# Patient Record
Sex: Female | Born: 1940
Health system: Southern US, Community
[De-identification: ages and names within clinical notes are randomized; demographics above are authoritative.]

## PROBLEM LIST (undated history)

## (undated) DIAGNOSIS — M5416 Radiculopathy, lumbar region: Secondary | ICD-10-CM

## (undated) DIAGNOSIS — M961 Postlaminectomy syndrome, not elsewhere classified: Secondary | ICD-10-CM

## (undated) DIAGNOSIS — Z87898 Personal history of other specified conditions: Secondary | ICD-10-CM

## (undated) DIAGNOSIS — F419 Anxiety disorder, unspecified: Secondary | ICD-10-CM

## (undated) DIAGNOSIS — E041 Nontoxic single thyroid nodule: Secondary | ICD-10-CM

## (undated) DIAGNOSIS — M5136 Other intervertebral disc degeneration, lumbar region: Secondary | ICD-10-CM

## (undated) DIAGNOSIS — M5417 Radiculopathy, lumbosacral region: Secondary | ICD-10-CM

## (undated) DIAGNOSIS — M51369 Other intervertebral disc degeneration, lumbar region without mention of lumbar back pain or lower extremity pain: Secondary | ICD-10-CM

## (undated) DIAGNOSIS — D126 Benign neoplasm of colon, unspecified: Secondary | ICD-10-CM

## (undated) DIAGNOSIS — E049 Nontoxic goiter, unspecified: Secondary | ICD-10-CM

## (undated) DIAGNOSIS — M7120 Synovial cyst of popliteal space [Baker], unspecified knee: Secondary | ICD-10-CM

## (undated) DIAGNOSIS — R112 Nausea with vomiting, unspecified: Secondary | ICD-10-CM

## (undated) DIAGNOSIS — M1990 Unspecified osteoarthritis, unspecified site: Secondary | ICD-10-CM

## (undated) DIAGNOSIS — I10 Essential (primary) hypertension: Secondary | ICD-10-CM

## (undated) DIAGNOSIS — Z9889 Other specified postprocedural states: Secondary | ICD-10-CM

## (undated) HISTORY — DX: Personal history of other specified conditions: Z87.898

## (undated) HISTORY — PX: SHOULDER SURGERY: SHX246

## (undated) HISTORY — PX: OOPHORECTOMY: SHX86

## (undated) HISTORY — DX: Other intervertebral disc degeneration, lumbar region: M51.36

## (undated) HISTORY — PX: FACELIFT: SHX1566

## (undated) HISTORY — DX: Postlaminectomy syndrome, not elsewhere classified: M96.1

## (undated) HISTORY — DX: Essential (primary) hypertension: I10

## (undated) HISTORY — DX: Nontoxic goiter, unspecified: E04.9

## (undated) HISTORY — PX: OTHER SURGICAL HISTORY: SHX169

## (undated) HISTORY — PX: ABDOMINAL HYSTERECTOMY: SHX81

## (undated) HISTORY — DX: Anxiety disorder, unspecified: F41.9

## (undated) HISTORY — DX: Unspecified osteoarthritis, unspecified site: M19.90

## (undated) HISTORY — DX: Benign neoplasm of colon, unspecified: D12.6

## (undated) HISTORY — PX: KNEE ARTHROSCOPY: SUR90

## (undated) HISTORY — DX: Other intervertebral disc degeneration, lumbar region without mention of lumbar back pain or lower extremity pain: M51.369

## (undated) HISTORY — DX: Radiculopathy, lumbar region: M54.16

## (undated) HISTORY — DX: Radiculopathy, lumbosacral region: M54.17

## (undated) HISTORY — DX: Synovial cyst of popliteal space (Baker), unspecified knee: M71.20

## (undated) HISTORY — PX: INCISIONAL HERNIA REPAIR: SHX193

## (undated) HISTORY — PX: KNEE SURGERY: SHX244

## (undated) HISTORY — DX: Nontoxic single thyroid nodule: E04.1

---

## 2002-01-22 ENCOUNTER — Other Ambulatory Visit: Admission: RE | Admit: 2002-01-22 | Discharge: 2002-01-22 | Payer: Self-pay | Admitting: Family Medicine

## 2002-02-05 ENCOUNTER — Encounter: Admission: RE | Admit: 2002-02-05 | Discharge: 2002-02-05 | Payer: Self-pay | Admitting: Family Medicine

## 2002-02-05 ENCOUNTER — Encounter: Payer: Self-pay | Admitting: Family Medicine

## 2003-02-12 ENCOUNTER — Encounter: Payer: Self-pay | Admitting: Family Medicine

## 2003-02-12 ENCOUNTER — Encounter: Admission: RE | Admit: 2003-02-12 | Discharge: 2003-02-12 | Payer: Self-pay | Admitting: Family Medicine

## 2003-03-12 DIAGNOSIS — D126 Benign neoplasm of colon, unspecified: Secondary | ICD-10-CM

## 2003-03-12 HISTORY — DX: Benign neoplasm of colon, unspecified: D12.6

## 2003-04-08 ENCOUNTER — Encounter: Payer: Self-pay | Admitting: Gastroenterology

## 2004-02-23 ENCOUNTER — Encounter: Admission: RE | Admit: 2004-02-23 | Discharge: 2004-02-23 | Payer: Self-pay | Admitting: Family Medicine

## 2004-03-01 ENCOUNTER — Encounter: Admission: RE | Admit: 2004-03-01 | Discharge: 2004-03-01 | Payer: Self-pay | Admitting: Family Medicine

## 2004-11-07 ENCOUNTER — Ambulatory Visit: Payer: Self-pay | Admitting: Internal Medicine

## 2004-11-07 ENCOUNTER — Encounter: Admission: RE | Admit: 2004-11-07 | Discharge: 2004-11-07 | Payer: Self-pay | Admitting: Internal Medicine

## 2005-01-23 ENCOUNTER — Encounter: Admission: RE | Admit: 2005-01-23 | Discharge: 2005-01-23 | Payer: Self-pay | Admitting: Otolaryngology

## 2005-02-01 ENCOUNTER — Observation Stay (HOSPITAL_COMMUNITY): Admission: RE | Admit: 2005-02-01 | Discharge: 2005-02-02 | Payer: Self-pay | Admitting: Specialist

## 2005-02-01 ENCOUNTER — Encounter (INDEPENDENT_AMBULATORY_CARE_PROVIDER_SITE_OTHER): Payer: Self-pay | Admitting: Specialist

## 2005-03-02 ENCOUNTER — Encounter: Admission: RE | Admit: 2005-03-02 | Discharge: 2005-03-02 | Payer: Self-pay | Admitting: Family Medicine

## 2005-04-04 ENCOUNTER — Ambulatory Visit: Payer: Self-pay | Admitting: Internal Medicine

## 2005-05-16 ENCOUNTER — Ambulatory Visit: Payer: Self-pay | Admitting: Internal Medicine

## 2005-05-31 ENCOUNTER — Encounter (INDEPENDENT_AMBULATORY_CARE_PROVIDER_SITE_OTHER): Payer: Self-pay | Admitting: *Deleted

## 2005-06-16 ENCOUNTER — Ambulatory Visit: Payer: Self-pay | Admitting: Internal Medicine

## 2005-09-11 DIAGNOSIS — I10 Essential (primary) hypertension: Secondary | ICD-10-CM

## 2005-09-11 HISTORY — DX: Essential (primary) hypertension: I10

## 2005-11-02 ENCOUNTER — Ambulatory Visit: Payer: Self-pay | Admitting: Internal Medicine

## 2005-12-18 ENCOUNTER — Ambulatory Visit: Payer: Self-pay | Admitting: Family Medicine

## 2005-12-19 ENCOUNTER — Ambulatory Visit (HOSPITAL_BASED_OUTPATIENT_CLINIC_OR_DEPARTMENT_OTHER): Admission: RE | Admit: 2005-12-19 | Discharge: 2005-12-19 | Payer: Self-pay | Admitting: Specialist

## 2005-12-26 ENCOUNTER — Ambulatory Visit: Payer: Self-pay | Admitting: Internal Medicine

## 2005-12-29 ENCOUNTER — Encounter: Admission: RE | Admit: 2005-12-29 | Discharge: 2005-12-29 | Payer: Self-pay | Admitting: Internal Medicine

## 2006-01-08 ENCOUNTER — Encounter (INDEPENDENT_AMBULATORY_CARE_PROVIDER_SITE_OTHER): Payer: Self-pay | Admitting: *Deleted

## 2006-01-08 ENCOUNTER — Other Ambulatory Visit: Admission: RE | Admit: 2006-01-08 | Discharge: 2006-01-08 | Payer: Self-pay | Admitting: Interventional Radiology

## 2006-01-08 ENCOUNTER — Encounter: Admission: RE | Admit: 2006-01-08 | Discharge: 2006-01-08 | Payer: Self-pay | Admitting: Internal Medicine

## 2006-03-20 ENCOUNTER — Ambulatory Visit: Payer: Self-pay | Admitting: Internal Medicine

## 2006-03-20 ENCOUNTER — Encounter: Admission: RE | Admit: 2006-03-20 | Discharge: 2006-03-20 | Payer: Self-pay | Admitting: Internal Medicine

## 2006-04-02 ENCOUNTER — Encounter: Admission: RE | Admit: 2006-04-02 | Discharge: 2006-04-02 | Payer: Self-pay | Admitting: Internal Medicine

## 2006-05-01 ENCOUNTER — Inpatient Hospital Stay (HOSPITAL_COMMUNITY): Admission: RE | Admit: 2006-05-01 | Discharge: 2006-05-04 | Payer: Self-pay | Admitting: Specialist

## 2006-05-28 ENCOUNTER — Encounter: Admission: RE | Admit: 2006-05-28 | Discharge: 2006-06-18 | Payer: Self-pay | Admitting: Specialist

## 2006-06-19 ENCOUNTER — Encounter: Admission: RE | Admit: 2006-06-19 | Discharge: 2006-07-29 | Payer: Self-pay | Admitting: Specialist

## 2006-07-17 ENCOUNTER — Ambulatory Visit: Payer: Self-pay | Admitting: Gastroenterology

## 2006-07-30 ENCOUNTER — Encounter: Admission: RE | Admit: 2006-07-30 | Discharge: 2006-08-10 | Payer: Self-pay | Admitting: Specialist

## 2006-09-25 ENCOUNTER — Ambulatory Visit (HOSPITAL_COMMUNITY): Admission: RE | Admit: 2006-09-25 | Discharge: 2006-09-26 | Payer: Self-pay | Admitting: Specialist

## 2006-09-26 ENCOUNTER — Encounter: Admission: RE | Admit: 2006-09-26 | Discharge: 2006-11-04 | Payer: Self-pay | Admitting: Specialist

## 2006-10-22 ENCOUNTER — Ambulatory Visit: Payer: Self-pay | Admitting: Internal Medicine

## 2006-10-22 LAB — CONVERTED CEMR LAB
BUN: 22 mg/dL (ref 6–23)
CO2: 27 meq/L (ref 19–32)
Calcium: 9.4 mg/dL (ref 8.4–10.5)
Chloride: 100 meq/L (ref 96–112)
Creatinine, Ser: 0.7 mg/dL (ref 0.4–1.2)
Free T4: 0.7 ng/dL (ref 0.6–1.6)
GFR calc Af Amer: 108 mL/min
GFR calc non Af Amer: 89 mL/min
Glucose, Bld: 75 mg/dL (ref 70–99)
Potassium: 3.8 meq/L (ref 3.5–5.1)
Sodium: 139 meq/L (ref 135–145)
T3, Free: 2.6 pg/mL (ref 2.3–4.2)
TSH: 1.79 microintl units/mL (ref 0.35–5.50)

## 2006-11-05 ENCOUNTER — Encounter: Admission: RE | Admit: 2006-11-05 | Discharge: 2007-02-03 | Payer: Self-pay | Admitting: Specialist

## 2006-12-20 DIAGNOSIS — M7120 Synovial cyst of popliteal space [Baker], unspecified knee: Secondary | ICD-10-CM | POA: Insufficient documentation

## 2007-01-11 ENCOUNTER — Ambulatory Visit: Payer: Self-pay | Admitting: Internal Medicine

## 2007-01-11 LAB — CONVERTED CEMR LAB
ALT: 17 units/L (ref 0–40)
AST: 27 units/L (ref 0–37)
Basophils Absolute: 0.1 10*3/uL (ref 0.0–0.1)
Basophils Relative: 0.8 % (ref 0.0–1.0)
Cholesterol: 236 mg/dL (ref 0–200)
Direct LDL: 122.6 mg/dL
Eosinophils Absolute: 0.4 10*3/uL (ref 0.0–0.6)
Eosinophils Relative: 4.7 % (ref 0.0–5.0)
HCT: 39.1 % (ref 36.0–46.0)
HDL: 82.4 mg/dL (ref >39.0)
Hemoglobin: 13.6 g/dL (ref 12.0–15.0)
Homocysteine: 12.4 micromoles/L (ref 5.00–13.90)
Lymphocytes Relative: 35.2 % (ref 12.0–46.0)
MCHC: 34.7 g/dL (ref 30.0–36.0)
MCV: 90.8 fL (ref 78.0–100.0)
Monocytes Absolute: 0.6 10*3/uL (ref 0.2–0.7)
Monocytes Relative: 7.6 % (ref 3.0–11.0)
Neutro Abs: 4.1 10*3/uL (ref 1.4–7.7)
Neutrophils Relative %: 51.7 % (ref 43.0–77.0)
Platelets: 294 10*3/uL (ref 150–400)
RBC: 4.31 M/uL (ref 3.87–5.11)
RDW: 12.9 % (ref 11.5–14.6)
Total CHOL/HDL Ratio: 2.9
Triglycerides: 78 mg/dL (ref 0–149)
VLDL: 16 mg/dL (ref 0–40)
WBC: 8.1 10*3/uL (ref 4.5–10.5)

## 2007-02-19 ENCOUNTER — Encounter: Admission: RE | Admit: 2007-02-19 | Discharge: 2007-02-19 | Payer: Self-pay | Admitting: Orthopedic Surgery

## 2007-05-02 ENCOUNTER — Encounter: Admission: RE | Admit: 2007-05-02 | Discharge: 2007-05-02 | Payer: Self-pay | Admitting: Internal Medicine

## 2007-05-02 ENCOUNTER — Encounter: Payer: Self-pay | Admitting: Internal Medicine

## 2007-05-14 ENCOUNTER — Encounter (INDEPENDENT_AMBULATORY_CARE_PROVIDER_SITE_OTHER): Payer: Self-pay | Admitting: *Deleted

## 2007-07-24 ENCOUNTER — Ambulatory Visit: Payer: Self-pay | Admitting: Internal Medicine

## 2007-08-06 ENCOUNTER — Encounter: Payer: Self-pay | Admitting: Internal Medicine

## 2007-11-27 ENCOUNTER — Telehealth (INDEPENDENT_AMBULATORY_CARE_PROVIDER_SITE_OTHER): Payer: Self-pay | Admitting: *Deleted

## 2007-12-02 ENCOUNTER — Telehealth: Payer: Self-pay | Admitting: Internal Medicine

## 2008-01-01 ENCOUNTER — Encounter: Payer: Self-pay | Admitting: Internal Medicine

## 2008-01-27 ENCOUNTER — Ambulatory Visit: Payer: Self-pay | Admitting: Internal Medicine

## 2008-01-27 ENCOUNTER — Other Ambulatory Visit: Admission: RE | Admit: 2008-01-27 | Discharge: 2008-01-27 | Payer: Self-pay | Admitting: Internal Medicine

## 2008-01-27 ENCOUNTER — Encounter: Payer: Self-pay | Admitting: Internal Medicine

## 2008-01-27 DIAGNOSIS — F411 Generalized anxiety disorder: Secondary | ICD-10-CM | POA: Insufficient documentation

## 2008-01-27 DIAGNOSIS — M1990 Unspecified osteoarthritis, unspecified site: Secondary | ICD-10-CM | POA: Insufficient documentation

## 2008-01-27 LAB — CONVERTED CEMR LAB: Pap Smear: NORMAL

## 2008-01-30 LAB — CONVERTED CEMR LAB
ALT: 17 units/L (ref 0–35)
AST: 21 units/L (ref 0–37)
BUN: 18 mg/dL (ref 6–23)
Basophils Absolute: 0 10*3/uL (ref 0.0–0.1)
Basophils Relative: 0.4 % (ref 0.0–1.0)
CO2: 32 meq/L (ref 19–32)
Calcium: 9.4 mg/dL (ref 8.4–10.5)
Chloride: 105 meq/L (ref 96–112)
Cholesterol: 251 mg/dL (ref 0–200)
Creatinine, Ser: 0.8 mg/dL (ref 0.4–1.2)
Direct LDL: 133.2 mg/dL
Eosinophils Absolute: 0.3 10*3/uL (ref 0.0–0.7)
Eosinophils Relative: 3.6 % (ref 0.0–5.0)
GFR calc Af Amer: 92 mL/min
GFR calc non Af Amer: 76 mL/min
Glucose, Bld: 93 mg/dL (ref 70–99)
HCT: 42.5 % (ref 36.0–46.0)
HDL: 88.6 mg/dL (ref >39.0)
Hemoglobin: 14.4 g/dL (ref 12.0–15.0)
Lymphocytes Relative: 33.7 % (ref 12.0–46.0)
MCHC: 33.8 g/dL (ref 30.0–36.0)
MCV: 93.4 fL (ref 78.0–100.0)
Monocytes Absolute: 0.5 10*3/uL (ref 0.1–1.0)
Monocytes Relative: 6.9 % (ref 3.0–12.0)
Neutro Abs: 4.4 10*3/uL (ref 1.4–7.7)
Neutrophils Relative %: 55.4 % (ref 43.0–77.0)
Platelets: 315 10*3/uL (ref 150–400)
Potassium: 3.6 meq/L (ref 3.5–5.1)
RBC: 4.55 M/uL (ref 3.87–5.11)
RDW: 12.3 % (ref 11.5–14.6)
Sodium: 143 meq/L (ref 135–145)
TSH: 1.26 microintl units/mL (ref 0.35–5.50)
Total CHOL/HDL Ratio: 2.8
Triglycerides: 117 mg/dL (ref 0–149)
VLDL: 23 mg/dL (ref 0–40)
WBC: 7.8 10*3/uL (ref 4.5–10.5)

## 2008-02-04 ENCOUNTER — Telehealth (INDEPENDENT_AMBULATORY_CARE_PROVIDER_SITE_OTHER): Payer: Self-pay | Admitting: *Deleted

## 2008-02-04 ENCOUNTER — Encounter (INDEPENDENT_AMBULATORY_CARE_PROVIDER_SITE_OTHER): Payer: Self-pay | Admitting: *Deleted

## 2008-02-05 ENCOUNTER — Ambulatory Visit: Payer: Self-pay | Admitting: Internal Medicine

## 2008-02-26 ENCOUNTER — Telehealth: Payer: Self-pay | Admitting: Gastroenterology

## 2008-03-24 ENCOUNTER — Ambulatory Visit: Payer: Self-pay | Admitting: Gastroenterology

## 2008-03-30 ENCOUNTER — Telehealth: Payer: Self-pay | Admitting: Gastroenterology

## 2008-03-31 ENCOUNTER — Ambulatory Visit: Payer: Self-pay | Admitting: Gastroenterology

## 2008-03-31 LAB — HM COLONOSCOPY

## 2008-04-17 ENCOUNTER — Ambulatory Visit: Payer: Self-pay | Admitting: Internal Medicine

## 2008-04-17 DIAGNOSIS — R5381 Other malaise: Secondary | ICD-10-CM | POA: Insufficient documentation

## 2008-04-17 DIAGNOSIS — R5383 Other fatigue: Secondary | ICD-10-CM

## 2008-04-20 ENCOUNTER — Telehealth: Payer: Self-pay | Admitting: Internal Medicine

## 2008-04-20 LAB — CONVERTED CEMR LAB
BUN: 23 mg/dL (ref 6–23)
Basophils Absolute: 0 10*3/uL (ref 0.0–0.1)
Basophils Relative: 0 % (ref 0–1)
Creatinine, Ser: 0.75 mg/dL (ref 0.40–1.20)
Eosinophils Absolute: 0.3 10*3/uL (ref 0.0–0.7)
Eosinophils Relative: 4 % (ref 0–5)
HCT: 45.4 % (ref 36.0–46.0)
Hemoglobin: 14.7 g/dL (ref 12.0–15.0)
Lymphocytes Relative: 34 % (ref 12–46)
Lymphs Abs: 3 10*3/uL (ref 0.7–4.0)
MCHC: 32.4 g/dL (ref 30.0–36.0)
MCV: 93.2 fL (ref 78.0–100.0)
Monocytes Absolute: 0.8 10*3/uL (ref 0.1–1.0)
Monocytes Relative: 9 % (ref 3–12)
Neutro Abs: 4.7 10*3/uL (ref 1.7–7.7)
Neutrophils Relative %: 53 % (ref 43–77)
Platelets: 305 10*3/uL (ref 150–400)
Potassium: 4.1 meq/L (ref 3.5–5.3)
RBC: 4.87 M/uL (ref 3.87–5.11)
RDW: 13.3 % (ref 11.5–15.5)
T4, Total: 6.4 ug/dL (ref 5.0–12.5)
TSH: 0.84 microintl units/mL (ref 0.350–4.50)
WBC: 8.9 10*3/uL (ref 4.0–10.5)

## 2008-04-27 ENCOUNTER — Encounter (INDEPENDENT_AMBULATORY_CARE_PROVIDER_SITE_OTHER): Payer: Self-pay | Admitting: *Deleted

## 2008-04-27 ENCOUNTER — Ambulatory Visit: Payer: Self-pay | Admitting: Internal Medicine

## 2008-04-27 LAB — CONVERTED CEMR LAB
Bilirubin Urine: NEGATIVE
Blood in Urine, dipstick: NEGATIVE
Glucose, Urine, Semiquant: NEGATIVE
Ketones, urine, test strip: NEGATIVE
Nitrite: NEGATIVE
Protein, U semiquant: NEGATIVE
Specific Gravity, Urine: 1.01
Urobilinogen, UA: 0.2
WBC Urine, dipstick: NEGATIVE
pH: 5

## 2008-04-29 ENCOUNTER — Telehealth: Payer: Self-pay | Admitting: Gastroenterology

## 2008-04-29 ENCOUNTER — Encounter (INDEPENDENT_AMBULATORY_CARE_PROVIDER_SITE_OTHER): Payer: Self-pay | Admitting: *Deleted

## 2008-04-29 LAB — CONVERTED CEMR LAB
ALT: 16 units/L (ref 0–35)
AST: 25 units/L (ref 0–37)
Albumin: 3.7 g/dL (ref 3.5–5.2)
Alkaline Phosphatase: 72 units/L (ref 39–117)
Amylase: 66 units/L (ref 27–131)
Bilirubin, Direct: 0.1 mg/dL (ref 0.0–0.3)
Hemoglobin: 14.7 g/dL (ref 12.0–15.0)
Lipase: 29 units/L (ref 11.0–59.0)
Total Bilirubin: 0.7 mg/dL (ref 0.3–1.2)
Total Protein: 7.1 g/dL (ref 6.0–8.3)

## 2008-04-30 ENCOUNTER — Ambulatory Visit: Payer: Self-pay | Admitting: Gastroenterology

## 2008-04-30 DIAGNOSIS — Z860100 Personal history of colon polyps, unspecified: Secondary | ICD-10-CM | POA: Insufficient documentation

## 2008-05-01 ENCOUNTER — Encounter: Payer: Self-pay | Admitting: Gastroenterology

## 2008-05-01 ENCOUNTER — Ambulatory Visit: Payer: Self-pay | Admitting: Gastroenterology

## 2008-05-04 ENCOUNTER — Encounter: Admission: RE | Admit: 2008-05-04 | Discharge: 2008-05-04 | Payer: Self-pay | Admitting: Internal Medicine

## 2008-05-05 ENCOUNTER — Ambulatory Visit (HOSPITAL_COMMUNITY): Admission: RE | Admit: 2008-05-05 | Discharge: 2008-05-05 | Payer: Self-pay | Admitting: Gastroenterology

## 2008-05-05 ENCOUNTER — Encounter: Payer: Self-pay | Admitting: Gastroenterology

## 2008-05-14 ENCOUNTER — Telehealth: Payer: Self-pay | Admitting: Gastroenterology

## 2008-05-27 ENCOUNTER — Ambulatory Visit: Payer: Self-pay | Admitting: Gastroenterology

## 2008-06-01 ENCOUNTER — Encounter (INDEPENDENT_AMBULATORY_CARE_PROVIDER_SITE_OTHER): Payer: Self-pay | Admitting: *Deleted

## 2008-07-14 ENCOUNTER — Ambulatory Visit: Payer: Self-pay | Admitting: Internal Medicine

## 2008-07-17 ENCOUNTER — Telehealth (INDEPENDENT_AMBULATORY_CARE_PROVIDER_SITE_OTHER): Payer: Self-pay | Admitting: *Deleted

## 2008-07-29 ENCOUNTER — Encounter (INDEPENDENT_AMBULATORY_CARE_PROVIDER_SITE_OTHER): Payer: Self-pay | Admitting: *Deleted

## 2008-08-17 ENCOUNTER — Inpatient Hospital Stay (HOSPITAL_COMMUNITY): Admission: RE | Admit: 2008-08-17 | Discharge: 2008-08-20 | Payer: Self-pay | Admitting: Orthopedic Surgery

## 2008-09-15 ENCOUNTER — Encounter: Admission: RE | Admit: 2008-09-15 | Discharge: 2008-10-26 | Payer: Self-pay | Admitting: Orthopedic Surgery

## 2008-10-01 ENCOUNTER — Encounter (INDEPENDENT_AMBULATORY_CARE_PROVIDER_SITE_OTHER): Payer: Self-pay | Admitting: *Deleted

## 2009-03-10 ENCOUNTER — Ambulatory Visit: Payer: Self-pay | Admitting: Internal Medicine

## 2009-03-16 ENCOUNTER — Encounter (INDEPENDENT_AMBULATORY_CARE_PROVIDER_SITE_OTHER): Payer: Self-pay | Admitting: *Deleted

## 2009-03-16 LAB — CONVERTED CEMR LAB
BUN: 24 mg/dL — ABNORMAL HIGH (ref 6–23)
CO2: 30 meq/L (ref 19–32)
Calcium: 9.5 mg/dL (ref 8.4–10.5)
Chloride: 104 meq/L (ref 96–112)
Creatinine, Ser: 0.8 mg/dL (ref 0.4–1.2)
GFR calc non Af Amer: 75.76 mL/min (ref >60)
Glucose, Bld: 101 mg/dL — ABNORMAL HIGH (ref 70–99)
Potassium: 4.1 meq/L (ref 3.5–5.1)
Sodium: 141 meq/L (ref 135–145)

## 2009-05-06 ENCOUNTER — Encounter: Admission: RE | Admit: 2009-05-06 | Discharge: 2009-05-06 | Payer: Self-pay | Admitting: Internal Medicine

## 2009-07-01 ENCOUNTER — Ambulatory Visit (HOSPITAL_COMMUNITY): Admission: RE | Admit: 2009-07-01 | Discharge: 2009-07-02 | Payer: Self-pay | Admitting: Orthopedic Surgery

## 2009-07-01 ENCOUNTER — Encounter: Payer: Self-pay | Admitting: Internal Medicine

## 2009-07-14 ENCOUNTER — Encounter: Admission: RE | Admit: 2009-07-14 | Discharge: 2009-09-07 | Payer: Self-pay | Admitting: Orthopedic Surgery

## 2009-10-25 ENCOUNTER — Ambulatory Visit: Payer: Self-pay | Admitting: Internal Medicine

## 2009-10-28 LAB — CONVERTED CEMR LAB
BUN: 25 mg/dL — ABNORMAL HIGH (ref 6–23)
Basophils Absolute: 0.1 10*3/uL (ref 0.0–0.1)
Basophils Relative: 0.6 % (ref 0.0–3.0)
CO2: 30 meq/L (ref 19–32)
Calcium: 9.6 mg/dL (ref 8.4–10.5)
Chloride: 106 meq/L (ref 96–112)
Creatinine, Ser: 0.8 mg/dL (ref 0.4–1.2)
Eosinophils Absolute: 0.3 10*3/uL (ref 0.0–0.7)
Eosinophils Relative: 3.6 % (ref 0.0–5.0)
GFR calc non Af Amer: 75.62 mL/min (ref >60)
Glucose, Bld: 89 mg/dL (ref 70–99)
HCT: 44.2 % (ref 36.0–46.0)
Hemoglobin: 14.7 g/dL (ref 12.0–15.0)
Lymphocytes Relative: 31.4 % (ref 12.0–46.0)
Lymphs Abs: 2.7 10*3/uL (ref 0.7–4.0)
MCHC: 33.2 g/dL (ref 30.0–36.0)
MCV: 94.5 fL (ref 78.0–100.0)
Monocytes Absolute: 0.9 10*3/uL (ref 0.1–1.0)
Monocytes Relative: 10.1 % (ref 3.0–12.0)
Neutro Abs: 4.5 10*3/uL (ref 1.4–7.7)
Neutrophils Relative %: 54.3 % (ref 43.0–77.0)
Platelets: 268 10*3/uL (ref 150.0–400.0)
Potassium: 4.8 meq/L (ref 3.5–5.1)
RBC: 4.68 M/uL (ref 3.87–5.11)
RDW: 12.7 % (ref 11.5–14.6)
Sodium: 142 meq/L (ref 135–145)
WBC: 8.5 10*3/uL (ref 4.5–10.5)

## 2009-11-08 ENCOUNTER — Inpatient Hospital Stay (HOSPITAL_COMMUNITY): Admission: RE | Admit: 2009-11-08 | Discharge: 2009-11-11 | Payer: Self-pay | Admitting: Orthopedic Surgery

## 2009-11-09 HISTORY — PX: TOTAL HIP ARTHROPLASTY: SHX124

## 2009-11-30 ENCOUNTER — Encounter
Admission: RE | Admit: 2009-11-30 | Discharge: 2010-01-04 | Payer: Self-pay | Source: Home / Self Care | Admitting: Orthopedic Surgery

## 2010-02-04 ENCOUNTER — Ambulatory Visit: Payer: Self-pay | Admitting: Internal Medicine

## 2010-02-04 ENCOUNTER — Encounter (INDEPENDENT_AMBULATORY_CARE_PROVIDER_SITE_OTHER): Payer: Self-pay | Admitting: *Deleted

## 2010-03-15 ENCOUNTER — Ambulatory Visit: Payer: Self-pay | Admitting: Internal Medicine

## 2010-03-15 DIAGNOSIS — E041 Nontoxic single thyroid nodule: Secondary | ICD-10-CM | POA: Insufficient documentation

## 2010-03-16 ENCOUNTER — Encounter: Payer: Self-pay | Admitting: Internal Medicine

## 2010-03-16 LAB — CONVERTED CEMR LAB: Vit D, 25-Hydroxy: 52 ng/mL (ref 30–89)

## 2010-03-17 LAB — CONVERTED CEMR LAB
ALT: 20 U/L
AST: 29 U/L
Albumin: 4.5 g/dL
Alkaline Phosphatase: 69 U/L
BUN: 26 mg/dL — ABNORMAL HIGH
Bilirubin, Direct: 0.1 mg/dL
CO2: 29 meq/L
Calcium: 9.4 mg/dL
Chloride: 104 meq/L
Cholesterol: 249 mg/dL — ABNORMAL HIGH
Creatinine, Ser: 0.7 mg/dL
Direct LDL: 121.9 mg/dL
GFR calc non Af Amer: 85.3 mL/min
Glucose, Bld: 98 mg/dL
HDL: 98.6 mg/dL
Potassium: 4.7 meq/L
Sodium: 141 meq/L
TSH: 1.13 u[IU]/mL
Total Bilirubin: 0.7 mg/dL
Total CHOL/HDL Ratio: 3
Total Protein: 6.3 g/dL
Triglycerides: 118 mg/dL
VLDL: 23.6 mg/dL

## 2010-03-21 ENCOUNTER — Encounter: Admission: RE | Admit: 2010-03-21 | Discharge: 2010-03-21 | Payer: Self-pay | Admitting: Internal Medicine

## 2010-03-21 ENCOUNTER — Encounter: Payer: Self-pay | Admitting: Internal Medicine

## 2010-04-12 ENCOUNTER — Encounter: Payer: Self-pay | Admitting: Internal Medicine

## 2010-05-03 ENCOUNTER — Encounter: Admission: RE | Admit: 2010-05-03 | Discharge: 2010-05-03 | Payer: Self-pay | Admitting: Surgery

## 2010-05-03 ENCOUNTER — Other Ambulatory Visit: Admission: RE | Admit: 2010-05-03 | Discharge: 2010-05-03 | Payer: Self-pay | Admitting: Interventional Radiology

## 2010-05-03 ENCOUNTER — Encounter: Payer: Self-pay | Admitting: Internal Medicine

## 2010-05-09 ENCOUNTER — Encounter: Payer: Self-pay | Admitting: Internal Medicine

## 2010-05-09 ENCOUNTER — Encounter: Admission: RE | Admit: 2010-05-09 | Discharge: 2010-05-09 | Payer: Self-pay | Admitting: Internal Medicine

## 2010-05-09 LAB — HM MAMMOGRAPHY: HM Mammogram: NEGATIVE

## 2010-06-13 ENCOUNTER — Encounter: Payer: Self-pay | Admitting: Internal Medicine

## 2010-06-17 ENCOUNTER — Encounter: Payer: Self-pay | Admitting: Internal Medicine

## 2010-06-28 ENCOUNTER — Telehealth (INDEPENDENT_AMBULATORY_CARE_PROVIDER_SITE_OTHER): Payer: Self-pay | Admitting: *Deleted

## 2010-08-17 ENCOUNTER — Encounter: Payer: Self-pay | Admitting: Internal Medicine

## 2010-08-17 ENCOUNTER — Ambulatory Visit: Payer: Self-pay | Admitting: Internal Medicine

## 2010-08-18 LAB — CONVERTED CEMR LAB
BUN: 23 mg/dL (ref 6–23)
Basophils Absolute: 0 10*3/uL (ref 0.0–0.1)
Basophils Relative: 0.5 % (ref 0.0–3.0)
CO2: 30 meq/L (ref 19–32)
Calcium: 9.5 mg/dL (ref 8.4–10.5)
Chloride: 99 meq/L (ref 96–112)
Creatinine, Ser: 0.8 mg/dL (ref 0.4–1.2)
Eosinophils Absolute: 0.4 10*3/uL (ref 0.0–0.7)
Eosinophils Relative: 4.3 % (ref 0.0–5.0)
GFR calc non Af Amer: 78.84 mL/min (ref >60.00)
Glucose, Bld: 69 mg/dL — ABNORMAL LOW (ref 70–99)
HCT: 40.2 % (ref 36.0–46.0)
Hemoglobin: 13.6 g/dL (ref 12.0–15.0)
Lymphocytes Relative: 27 % (ref 12.0–46.0)
Lymphs Abs: 2.4 10*3/uL (ref 0.7–4.0)
MCHC: 33.9 g/dL (ref 30.0–36.0)
MCV: 94.9 fL (ref 78.0–100.0)
Monocytes Absolute: 0.7 10*3/uL (ref 0.1–1.0)
Monocytes Relative: 7.8 % (ref 3.0–12.0)
Neutro Abs: 5.4 10*3/uL (ref 1.4–7.7)
Neutrophils Relative %: 60.4 % (ref 43.0–77.0)
Platelets: 286 10*3/uL (ref 150.0–400.0)
Potassium: 4.5 meq/L (ref 3.5–5.1)
RBC: 4.24 M/uL (ref 3.87–5.11)
RDW: 13.9 % (ref 11.5–14.6)
Sodium: 140 meq/L (ref 135–145)
WBC: 8.9 10*3/uL (ref 4.5–10.5)

## 2010-09-06 ENCOUNTER — Encounter: Payer: Self-pay | Admitting: Internal Medicine

## 2010-09-23 ENCOUNTER — Telehealth: Payer: Self-pay | Admitting: Internal Medicine

## 2010-09-26 ENCOUNTER — Inpatient Hospital Stay (HOSPITAL_COMMUNITY)
Admission: RE | Admit: 2010-09-26 | Discharge: 2010-09-28 | Payer: Self-pay | Source: Home / Self Care | Attending: Orthopedic Surgery | Admitting: Orthopedic Surgery

## 2010-09-26 LAB — COMPREHENSIVE METABOLIC PANEL
ALT: 16 U/L (ref 0–35)
AST: 21 U/L (ref 0–37)
Albumin: 4.5 g/dL (ref 3.5–5.2)
Alkaline Phosphatase: 67 U/L (ref 39–117)
BUN: 30 mg/dL — ABNORMAL HIGH (ref 6–23)
CO2: 28 mEq/L (ref 19–32)
Calcium: 9.9 mg/dL (ref 8.4–10.5)
Chloride: 100 mEq/L (ref 96–112)
Creatinine, Ser: 1.04 mg/dL (ref 0.4–1.2)
GFR calc Af Amer: >60 mL/min (ref >60)
GFR calc non Af Amer: 53 mL/min — ABNORMAL LOW (ref >60)
Glucose, Bld: 98 mg/dL (ref 70–99)
Potassium: 4.6 mEq/L (ref 3.5–5.1)
Sodium: 138 mEq/L (ref 135–145)
Total Bilirubin: 1 mg/dL (ref 0.3–1.2)
Total Protein: 6.7 g/dL (ref 6.0–8.3)

## 2010-09-26 LAB — URINALYSIS, ROUTINE W REFLEX MICROSCOPIC
Bilirubin Urine: NEGATIVE
Hgb urine dipstick: NEGATIVE
Ketones, ur: NEGATIVE mg/dL
Nitrite: NEGATIVE
Protein, ur: NEGATIVE mg/dL
Specific Gravity, Urine: 1.018 (ref 1.005–1.030)
Urine Glucose, Fasting: NEGATIVE mg/dL
Urobilinogen, UA: 0.2 mg/dL (ref 0.0–1.0)
pH: 5.5 (ref 5.0–8.0)

## 2010-09-26 LAB — APTT: aPTT: 27 seconds (ref 24–37)

## 2010-09-26 LAB — TYPE AND SCREEN
ABO/RH(D): B NEG
Antibody Screen: NEGATIVE

## 2010-09-26 LAB — PROTIME-INR
INR: 1.04 (ref 0.00–1.49)
Prothrombin Time: 13.8 seconds (ref 11.6–15.2)

## 2010-09-26 LAB — CBC
HCT: 42.2 % (ref 36.0–46.0)
Hemoglobin: 13.9 g/dL (ref 12.0–15.0)
MCH: 31.6 pg (ref 26.0–34.0)
MCHC: 32.9 g/dL (ref 30.0–36.0)
MCV: 95.9 fL (ref 78.0–100.0)
Platelets: 337 10*3/uL (ref 150–400)
RBC: 4.4 MIL/uL (ref 3.87–5.11)
RDW: 13.2 % (ref 11.5–15.5)
WBC: 10.2 10*3/uL (ref 4.0–10.5)

## 2010-09-26 LAB — SURGICAL PCR SCREEN
MRSA, PCR: NEGATIVE
Staphylococcus aureus: POSITIVE — AB

## 2010-09-28 LAB — BASIC METABOLIC PANEL
BUN: 12 mg/dL (ref 6–23)
CO2: 29 mEq/L (ref 19–32)
Calcium: 8.9 mg/dL (ref 8.4–10.5)
Chloride: 105 mEq/L (ref 96–112)
Creatinine, Ser: 0.81 mg/dL (ref 0.4–1.2)
GFR calc Af Amer: >60 mL/min (ref >60)
GFR calc non Af Amer: >60 mL/min (ref >60)
Glucose, Bld: 152 mg/dL — ABNORMAL HIGH (ref 70–99)
Potassium: 4.5 mEq/L (ref 3.5–5.1)
Sodium: 139 mEq/L (ref 135–145)

## 2010-09-28 LAB — CBC
HCT: 39.1 % (ref 36.0–46.0)
Hemoglobin: 12.4 g/dL (ref 12.0–15.0)
MCH: 30.8 pg (ref 26.0–34.0)
MCHC: 31.7 g/dL (ref 30.0–36.0)
MCV: 97.3 fL (ref 78.0–100.0)
Platelets: 279 10*3/uL (ref 150–400)
RBC: 4.02 MIL/uL (ref 3.87–5.11)
RDW: 13.4 % (ref 11.5–15.5)
WBC: 11.5 10*3/uL — ABNORMAL HIGH (ref 4.0–10.5)

## 2010-09-28 LAB — PROTIME-INR
INR: 1 (ref 0.00–1.49)
Prothrombin Time: 13.4 seconds (ref 11.6–15.2)

## 2010-10-02 ENCOUNTER — Encounter: Payer: Self-pay | Admitting: Family Medicine

## 2010-10-02 ENCOUNTER — Encounter: Payer: Self-pay | Admitting: Internal Medicine

## 2010-10-03 ENCOUNTER — Encounter: Payer: Self-pay | Admitting: Internal Medicine

## 2010-10-03 LAB — CBC
HCT: 40.4 % (ref 36.0–46.0)
Hemoglobin: 12.7 g/dL (ref 12.0–15.0)
MCH: 31 pg (ref 26.0–34.0)
MCHC: 31.4 g/dL (ref 30.0–36.0)
MCV: 98.5 fL (ref 78.0–100.0)
Platelets: 290 10*3/uL (ref 150–400)
RBC: 4.1 MIL/uL (ref 3.87–5.11)
RDW: 13.6 % (ref 11.5–15.5)
WBC: 10.6 10*3/uL — ABNORMAL HIGH (ref 4.0–10.5)

## 2010-10-03 LAB — BASIC METABOLIC PANEL
BUN: 12 mg/dL (ref 6–23)
CO2: 29 mEq/L (ref 19–32)
Calcium: 9.1 mg/dL (ref 8.4–10.5)
Chloride: 104 mEq/L (ref 96–112)
Creatinine, Ser: 0.94 mg/dL (ref 0.4–1.2)
GFR calc Af Amer: >60 mL/min (ref >60)
GFR calc non Af Amer: 59 mL/min — ABNORMAL LOW (ref >60)
Glucose, Bld: 105 mg/dL — ABNORMAL HIGH (ref 70–99)
Potassium: 4.2 mEq/L (ref 3.5–5.1)
Sodium: 140 mEq/L (ref 135–145)

## 2010-10-03 LAB — PROTIME-INR
INR: 1.51 — ABNORMAL HIGH (ref 0.00–1.49)
Prothrombin Time: 18.4 seconds — ABNORMAL HIGH (ref 11.6–15.2)

## 2010-10-09 LAB — CONVERTED CEMR LAB
BUN: 12 mg/dL (ref 6–23)
Basophils Absolute: 0.1 10*3/uL (ref 0.0–0.1)
Basophils Relative: 0.6 % (ref 0.0–3.0)
CO2: 31 meq/L (ref 19–32)
Calcium: 9.6 mg/dL (ref 8.4–10.5)
Chloride: 102 meq/L (ref 96–112)
Creatinine, Ser: 0.7 mg/dL (ref 0.4–1.2)
Eosinophils Absolute: 0.2 10*3/uL (ref 0.0–0.7)
Eosinophils Relative: 2.5 % (ref 0.0–5.0)
GFR calc Af Amer: 107 mL/min
GFR calc non Af Amer: 89 mL/min
Glucose, Bld: 122 mg/dL — ABNORMAL HIGH (ref 70–99)
HCT: 41.6 % (ref 36.0–46.0)
Hemoglobin: 14.3 g/dL (ref 12.0–15.0)
Lymphocytes Relative: 32 % (ref 12.0–46.0)
MCHC: 34.4 g/dL (ref 30.0–36.0)
MCV: 92.3 fL (ref 78.0–100.0)
Monocytes Absolute: 0.6 10*3/uL (ref 0.1–1.0)
Monocytes Relative: 6.3 % (ref 3.0–12.0)
Neutro Abs: 5.2 10*3/uL (ref 1.4–7.7)
Neutrophils Relative %: 58.6 % (ref 43.0–77.0)
Platelets: 311 10*3/uL (ref 150–400)
Potassium: 3.8 meq/L (ref 3.5–5.1)
RBC: 4.51 M/uL (ref 3.87–5.11)
RDW: 13.3 % (ref 11.5–14.6)
Sodium: 141 meq/L (ref 135–145)
WBC: 9 10*3/uL (ref 4.5–10.5)

## 2010-10-11 NOTE — Letter (Signed)
Summary: [place] No Show Letter  [facility] [place]  [address] [place], [place] [address]   Phone: [phone]  Fax: [phone]    07/29/2008 MRN: [id number]  [facility] [address] [place], [place]  [address]   Dear Ms. Deshazer,   Our records indicate that you missed your scheduled appointment with Dr. Drue Novel on 07/29/08.  Please contact this office to reschedule your appointment as soon as possible.  It is important that you keep your scheduled appointments with your physician, so we can provide you the best care possible.  Please be advised that there may be a charge for "no show" appointments.    Sincerely,   [facility]

## 2010-10-11 NOTE — Letter (Signed)
Summary: Patient [facility] Biopsy Results  [facility]  [address] [place], [place] [address]   Phone: [phone]  Fax: [phone]        May 05, 2008 MRN: [id number]    [facility] [address] [place], [place]  [address]    Dear Ms. Start,  I am pleased to inform you that the biopsies taken during your recent endoscopic examination did not show any evidence of cancer upon pathologic examination. The biopsies showed gastritis.   Continue with the treatment plan as outlined on the day of your      exam.  Please call us if you are having persistent problems or have questions about your condition that have not been fully answered at this time.  Sincerely,  Meryl Dare MD [facility]  This letter has been electronically signed by your physician.

## 2010-10-11 NOTE — Assessment & Plan Note (Signed)
Summary: F/U Gastritis/CYM.   History of Present Illness Visit Type: follow up Primary GI MD: Elie Goody MD [facility] Primary [name]: [name], MD Requesting Lateasha Breuer: [name], MD Chief Complaint: Pt is following up of  gastritis.  She is feeling better.  She has no GI complaints today. History of Present Illness:   [name] returns for follow up of gastritis. Her symptoms have completely resolved since discontinuing Naprosyn. Biopsies at endoscopy revealed chronic active gastritis. Gastric emptying scan performed after her endoscopy was unremarkable.    GI Review of Systems      Denies abdominal pain, acid reflux, belching, bloating, chest pain, dysphagia with liquids, dysphagia with solids, heartburn, loss of appetite, nausea, vomiting, vomiting blood, weight loss, and  weight gain.        Denies anal fissure, black tarry stools, change in bowel habit, constipation, diarrhea, diverticulosis, fecal incontinence, heme positive stool, hemorrhoids, irritable bowel syndrome, jaundice, light color stool, liver problems, rectal bleeding, and  rectal pain.    Prior Medications Reviewed Using: Patient Recall  Updated Prior Medication List: LORAZEPAM 0.5 MG  TABS (LORAZEPAM) TAKE AS DIRECTED LISINOPRIL-HYDROCHLOROTHIAZIDE 20-12.5 MG  TABS (LISINOPRIL-HYDROCHLOROTHIAZIDE) 1/2 by mouth qd ZYRTEC ALLERGY 10 MG  TABS (CETIRIZINE HCL) 1 by mouth once daily as needed ASPIRIN ADULT LOW STRENGTH 81 MG  TBEC (ASPIRIN) 1 by mouth once daily MULTIVITAMINS   TABS (MULTIPLE VITAMIN) Take 1 tablet by mouth once a day GLUCOSAMINE-CHONDROITIN 500-400 MG  CAPS (GLUCOSAMINE-CHONDROITIN) 1 by mouth once daily CALCIUM 500/D 500-200 MG-UNIT  TABS (CALCIUM CARBONATE-VITAMIN D) 1 by mouth once daily PROTONIX 40 MG TBEC (PANTOPRAZOLE SODIUM) one tablet by mouth once daily VICODIN 5-500 MG TABS (HYDROCODONE-ACETAMINOPHEN) as needed for knee pain  Current Allergies (reviewed today): No known  allergies   Past Medical History:    Hypertension (09/11/2005)    elevated homocysteine level    Anxiety    thyroid goiter/nodule -- rt benign colloid nodule -- left hyperplastic nodule - Thyroid bx 2006: neg    OA- shoulder pain, knee pain after TKR L, Hx of BAKER'S CYST     Goiter    Hx adenomatous colon polyps 03/2003  Past Surgical History:    Hysterectomy and oophorectomy    knee replacement (left) 2007, f/u w/ Dr Despina Hick    facelift    incisional hernia repair     knee surgery x 5   Family History:    Reviewed history from 04/30/2008 and no changes required:       breast ca--no       CAD-- F CABG at age 53s, Brother       Family History of Colon Cancer: Father (mets from prostate)       Family History of Prostate Cancer: Father        Family History of Clotting disorder: Father       Family History of Colon Polyps: Brother, Father  Social History:    Reviewed history from 04/30/2008 and no changes required:       Married       2 children       Occupation: Retired Runner, broadcasting/film/video       Patient is a former smoker. -Stopped 1985       Alcohol Use - yes-2 glasses daily       Daily Caffeine Use-1 cup daily       Illicit Drug Use - no       Patient gets regular exercise.   Risk Factors: Tobacco use:  quit    Year quit:  1982 Drug use:  no Alcohol use:  yes Exercise:  yes  Colonoscopy History:    Date of Last Colonoscopy:  03/31/2008  Mammogram History:    Date of Last Mammogram:  05/04/2008  PAP Smear History:    Date of Last PAP Smear:  01/27/2008   Review of Systems       The patient complains of arthritis/joint pain.         The pertinent positives and negatives are noted as above and in the HPI. All other ROS were negative.    Vital Signs:  Patient Profile:   71 Years Old Female Height:     64.75 inches Weight:      181 pounds BMI:     30.46 Pulse rate:   56 / minute Pulse rhythm:   regular BP sitting:   122 / 76  (left arm) Cuff size:    regular  Vitals Entered By: Francee Piccolo CMA (May 27, 2008 9:01 AM)                  Physical Exam  Head:     Normocephalic and atraumatic. Eyes:     PERRLA, no icterus. Mouth:     No deformity or lesions, dentition normal. Lungs:     Clear throughout to auscultation. Heart:     Regular rate and rhythm; no murmurs, rubs,  or bruits. Abdomen:     Soft, nontender and nondistended. No masses, hepatosplenomegaly or hernias noted. Normal bowel sounds. Neurologic:     Alert and  oriented x4;  grossly normal neurologically. Psych:     Alert and cooperative. Normal mood and affect.   Impression & Recommendations:  Problem # 1:  GASTRITIS (ICD-535.50) Chronic active gastritis related to NSAID usage. She is to minimize or avoid NSAIDs. Complete an 8 week course of pantoprazole and then may discontinue. If NSAIDs are needed in the future she should take a daily proton pump inhibitor.  Problem # 2:  PERSONAL HX COLONIC POLYPS (ICD-V12.72) Surveillance colonoscopy recommended in July 2014.  Problem # 3:  FAMILY HX COLON CANCER (ICD-V16.0) As in problem #2.  Patient Instructions: 1)  Please schedule a follow-up appointment as needed. 2)  Copy Sent To: [name] MD  ]

## 2010-10-11 NOTE — Progress Notes (Signed)
Summary: needs pre-op  eval in December  Phone Note Outgoing Call   Summary of Call: note from [facility] Patient to have knee surgery in January and needs preop clearance Please advise patient to come early in December for a preop eval Jose E. Paz MD  June 28, 2010 6:33 PM    Follow-up for Phone Call        left message to call back to sch.Harold Barban  June 29, 2010 8:32 AM  Additional Follow-up for Phone Call Additional follow up Details #1::        Patient has an appt on 12.7.11. Additional Follow-up by: Harold Barban,  June 30, 2010 9:40 AM

## 2010-10-11 NOTE — Procedures (Signed)
Summary: Colon   Colonoscopy  Procedure date:  04/08/2003  Findings:      Pathology:  Adenomatous polyp.        Location:  [facility].    Procedures Next Due Date:    Colonoscopy: 04/2006  Patient Name: [name], [name] MRN:  Procedure Procedures: Colonoscopy CPT: 09811.    with Hot Biopsy(s)CPT: [id number].  Personnel: Endoscopist: Venita Lick. Russella Dar, MD, Clementeen Graham.  Referred By: Angelena Sole, MD.  Exam Location: Exam performed in Outpatient Clinic. Outpatient  Patient Consent: Procedure, Alternatives, Risks and Benefits discussed, consent obtained, from patient. Consent was obtained by the RN.  Indications  Increased Risk Screening: For family history of colorectal neoplasia, in  parent age at onset: 24s.  Comments: Father with colon ca and brother with colon polyps History  Pre-Exam Physical: Performed Apr 08, 2003. Entire physical exam was normal.  Exam Exam: Extent of exam reached: Cecum, extent intended: Cecum.  The cecum was identified by appendiceal orifice and IC valve. Colon retroflexion performed. ASA Classification: II. Tolerance: excellent.  Monitoring: Pulse and BP monitoring, Oximetry used. Supplemental O2 given.  Colon Prep Used Golytely for colon prep. Prep results: good.  Sedation Meds: Patient assessed and found to be appropriate for moderate (conscious) sedation. Fentanyl 100 mcg. given IV. Versed 10 mg. given IV.  Findings POLYP: Descending Colon, Maximum size: 4 mm. sessile polyp. Procedure:  hot biopsy, removed, retrieved, Polyp sent to pathology. ICD9: Colon Polyps: 211.3.  NORMAL EXAM: Cecum to Splenic Flexure.  NORMAL EXAM: Rectum to Sigmoid Colon.   Assessment  Diagnoses: 211.3: Colon Polyps.   Events  Unplanned Interventions: No intervention was required.  Unplanned Events: There were no complications. Plans  Post Exam Instructions: No aspirin or non-steroidal containing medications: 2 weeks.  Medication Plan: Await  pathology. Continue current medications.  Patient Education: Patient given standard instructions for: Polyps.  Disposition: After procedure patient sent to recovery. After recovery patient sent home.  Scheduling/Referral: Colonoscopy, to [facility] T. Russella Dar, MD, [facility], around Apr 07, 2006.    This report was created from the original endoscopy report, which was reviewed and signed by the above listed endoscopist.    cc: Laveda Abbe

## 2010-10-11 NOTE — Assessment & Plan Note (Signed)
  Nurse Visit      Zostavax # 1    Vaccine Type: Zostavax    Site: left deltoid    Mfr: Merck    Dose: 0.5 ml    Route: [place]    Given by: Shary Decamp    Exp. Date: 03/21/2008    Lot #: [id number]   Orders Added: 1)  Zoster (Shingles) Vaccine Live [90736] 2)  Admin 1st Vaccine [40102]

## 2010-10-11 NOTE — Assessment & Plan Note (Signed)
Summary: CPX,WANTS PAP,FASTING,MEDICARE & BCBS/RH......   Vital Signs:  Patient profile:   70 year old female Height:      65 inches Weight:      177 pounds BMI:     29.56 Temp:     97.8 degrees F Pulse rate:   65 / minute Pulse rhythm:   regular BP sitting:   126 / 82  (left arm) Cuff size:   large  Vitals Entered By: Army Fossa CMA (March 15, 2010 8:49 AM) CC: CPX, PAP, fasting   History of Present Illness: Here for Medicare AWV:  1.Risk factors based on Past M, S, F history: done , see below  2.Physical Activities: active walks 2 miles a day 3.Depression/mood:  no evidence of depression 4.Hearing: normal to whispered voice at 6 feet, no reported problems  5.ADL's: totally independent  6.Fall Risk: low  7.Home Safety: reviewed, fire detectors uptodate 8.Height, weight, &visual acuity: see VS, vision corrected w/ reading glasses  9.Counseling: not needed at this time except for healthy lifestyle advise  10.Labs ordered based on risk factors: yes  11. Referral Coordination-- yes, DEXA 12. Care Plan---- see a/p 13.  Cognitive Assessment: AOx3, alertness and motor skills normal  in addition to the Medicare visit we did the following:  Hypertension -- no ambulatory BPs , good medication compliance  Anxiety-- occasionally takes lorazepam  OA-s/p several joint replacemnts, saw rheumatology, Rx Vinovo, it works , just $$$        Preventive Screening-Counseling & Management  Alcohol-Tobacco     Alcohol drinks/day: 1     Alcohol type: wine  Caffeine-Diet-Exercise     Type of exercise: walking  Allergies: 1)  ! Percocet  Past History:  Past Medical History: Hypertension (09/11/2005) elevated homocysteine level Anxiety thyroid goiter/nodule -- rt benign colloid nodule -- left hyperplastic nodule - Thyroid bx 2006: neg OA- shoulder pain, knee pain after TKR L, Hx of BAKER'S CYST  Goiter Hx adenomatous colon polyps 03/2003  Past Surgical  History: Hysterectomy and oophorectomy knee replacement (left) 2007 Dr Earlie Lou then redo TKR 11-09  w/ Dr Despina Hick Hip replacement  R (3-11) facelift incisional hernia repair  knee surgery x 5 shoulder surgery 06-2009  Family History: Reviewed history from 07/14/2008 and no changes required. breast ca--no CAD-- F CABG at age 29s, Brother Family History of Colon Cancer: Father (mets from prostate) Prostate Cancer: Father  Clotting disorder: Father Colon Polyps: Brother, Father  Social History: Married 2 children Occupation: Retired Runner, broadcasting/film/video Patient is a former smoker. -Stopped 1985 Alcohol Use - yes-2 glasses daily Daily Caffeine Use-1 cup daily Illicit Drug Use - no Patient walks 2 miles  daily  diet-- healthy   Review of Systems CV:  Denies chest pain or discomfort and swelling of feet. Resp:  Denies cough and shortness of breath. GI:  Denies bloody stools, diarrhea, nausea, and vomiting. GU:  no vag d/c or bleeding  occasionally SBE, normal . Neuro:  Denies headaches and memory loss.  Physical Exam  General:  alert, well-developed, and well-nourished.   Neck:  full ROM and no masses.  right thyroid nodule, nontender, measures about 1.5 cm Breasts:  No mass, nodules, thickening, tenderness, bulging, retraction, inflamation, nipple discharge or skin changes noted.  no axillary lymph nodes Lungs:  normal respiratory effort, no intercostal retractions, no accessory muscle use, and normal breath sounds.   Heart:  normal rate, regular rhythm, no murmur, and no gallop.   Abdomen:  soft, non-tender, and no distention.  Extremities:  no lower extremity edema Neurologic:  alert & oriented X3 and strength normal in all extremities.   Psych:  Cognition and judgment appear intact. Alert and cooperative with normal attention span and concentration.  not anxious appearing and not depressed appearing.     Impression & Recommendations:  Problem # 1:  HEALTH SCREENING (ICD-V70.0) Td  03 pneumonia shot 09 Shingles shot--had in 2008  multiple neg MMG,  last 04-2009 ,  breast  exam negative today last PAP 2009----: neg  s/p Hysterectomy for bening reasons, we agreed to do a PAP q 3 years   last Colonoscopy:  04/08/2003  (Results:  polyps -- next colon per GI)    DEXA 2006--normal, ordering a bone density test  encouraged to continue with her healthy diet and exercise daily  Orders: TLB-Lipid Panel (80061-LIPID) Radiology Referral (Radiology) First annual wellness visit with prevention plan  (O9629)  Problem # 2:  THYROID NODULE (ICD-241.0) previously felt , negative biopsy in the past. Thyroid has increased in size in the last couple of years per  physical exam plan: ultrasound  Orders: Radiology Referral (Radiology)  Problem # 3:  OSTEOARTHRITIS (ICD-715.90) vimovo per rheumatology , tolerates well Her updated medication list for this problem includes:    Aspirin Adult Low Strength 81 Mg Tbec (Aspirin) .Marland Kitchen... 1 by mouth once daily    Vimovo 500-20 Mg Tbec (Naproxen-esomeprazole) .Marland Kitchen... Per rheumatology  Orders: T-Vitamin D (25-Hydroxy) [phone]) Venipuncture [phone])  Problem # 4:  ANXIETY (ICD-300.00) well-controlled, refill p.r.n. Her updated medication list for this problem includes:    Lorazepam 0.5 Mg Tabs (Lorazepam) .Marland Kitchen... Take as directed     Problem # 5:  HYPERTENSION (ICD-401.9) well-controlled, discussed diet and exercise  The following medications were removed from the medication list:    Metoprolol Tartrate 25 Mg Tabs (Metoprolol tartrate) .Marland Kitchen... 1 tab  two times a day... 2 days prior and 2 days after hip replacement Her updated medication list for this problem includes:    Lisinopril-hydrochlorothiazide 20-12.5 Mg Tabs (Lisinopril-hydrochlorothiazide) .Marland Kitchen... 1/2  by mouth once daily    BP today: 126/82 Prior BP: 130/60 (10/25/2009)  Prior 10 Yr Risk Heart Disease: Not enough information (04/17/2008)  Labs Reviewed: K+: 4.8  (10/25/2009) Creat: : 0.8 (10/25/2009)   Chol: 251 (01/27/2008)   HDL: 88.6 (01/27/2008)   LDL: DEL (01/27/2008)   TG: 117 (01/27/2008)  Orders: TLB-BMP (Basic Metabolic Panel-BMET) (80048-METABOL) TLB-Hepatic/Liver Function Pnl (80076-HEPATIC) TLB-TSH (Thyroid Stimulating Hormone) (84443-TSH)  Complete Medication List: 1)  Lorazepam 0.5 Mg Tabs (Lorazepam) .... Take as directed 2)  Lisinopril-hydrochlorothiazide 20-12.5 Mg Tabs (Lisinopril-hydrochlorothiazide) .... 1/2  by mouth once daily 3)  Zyrtec Allergy 10 Mg Tabs (Cetirizine hcl) .Marland Kitchen.. 1 by mouth once daily as needed 4)  Aspirin Adult Low Strength 81 Mg Tbec (Aspirin) .Marland Kitchen.. 1 by mouth once daily 5)  Multivitamins Tabs (Multiple vitamin) .... Take 1 tablet by mouth once a day 6)  Calcium 500/d 500-200 Mg-unit Tabs (Calcium carbonate-vitamin d) .Marland Kitchen.. 1 by mouth once daily 7)  Vimovo 500-20 Mg Tbec (Naproxen-esomeprazole) .... Per rheumatology  Patient Instructions: 1)  Please schedule a follow-up appointment in 6 months .    Risk Factors:     Type:  wine    Drinks per day:  1 Exercise:  yes    Type:  walking

## 2010-10-11 NOTE — Letter (Signed)
Summary: Surgical Clearance/[facility]  Surgical Clearance/[facility]   Imported By: Lanelle Bal 10/29/2009 13:54:33  _____________________________________________________________________  External Attachment:    Type:   Image     Comment:   External Document

## 2010-10-11 NOTE — Letter (Signed)
Summary: Primary Care Consult Scheduled Letter  [facility] [place]  [address] [place], [place] [address]   Phone: [phone]  Fax: [phone]      04/27/2008 MRN: [id number]  [facility] [address] [place], [place]  [address]    Dear [name],      We have scheduled an appointment for you.  At the recommendation of Dr.Paz, we have scheduled you a consult with Dr. Russella Dar at [facility] on September 14th at 3:30pm.  Their address is [address] [place]. The office phone number is [phone].  If this appointment day and time is not convenient for you, please feel free to call the office of the doctor you are being referred to at the number listed above and reschedule the appointment.     It is important for you to keep your scheduled appointments. We are here to make sure you are given good patient care. If you have questions or you have made changes to your appointment, please notify us at  [phone], ask for [name].    Thank you,  Patient Care Coordinator [name] at [facility]

## 2010-10-11 NOTE — Progress Notes (Signed)
Summary: TRIAGE  Phone Note From Other Clinic Call back at [phone]   Caller: Tiffany @ Dr Drue Novel Call For: [name] Reason for Call: Schedule Patient Appt Details for Reason: TRIAGE Summary of Call: NP3 sch on 9-14 Wondering if she can be seen sooner...due to NAUSEA Initial call taken by: Guadlupe Spanish [facility],  April 29, 2008 4:06 PM  Follow-up for Phone Call        nausea worse with meals , unable to wait untill appt scheduled 04-24-08, offered appt with Clover Mealy on 05-01-08, but pt has an appt to have her knee drained.  Appt made for 04-30-08 9:15, she will arrive at 9:00 to fill out paperwork Follow-up by: Darcey Nora RN,  April 29, 2008 4:38 PM

## 2010-10-11 NOTE — Assessment & Plan Note (Signed)
Summary: NAUSEA.Marland KitchenEM   History of Present Illness Visit Type: consult Primary GI MD: Elie Goody MD [facility] Primary Provider: [name], MD Requesting Provider: [name], MD Chief Complaint: Patient c/o 1 month nausea-"hungy feeling."  Patient also c/o diarrhea after meals as well. History of Present Illness:   [name] relates a one month history of on going nausea associated with a "hungry feeling". She describes a gnawing epigastric discomfort, loss of appetite and 5 pound weight loss. She has been maintained on chronic Naprosyn on a daily aspirin. The Naprosyn was discontinued 2 weeks ago with no relief of symtpoms. She was recently started on Kapidex and began having diarrhea. Blood work obtained on August 17 by Dr. Drue Novel was unremarkable. Her nausea is slightly worsened with meals that is present throughout the day.   GI Review of Systems    Reports acid reflux, loss of appetite, nausea, and  weight loss.     Location of  Abdominal pain: upper abdomen. Weight loss of 5 pounds over 2 weeks.   Denies abdominal pain, belching, bloating, chest pain, dysphagia with liquids, dysphagia with solids, heartburn, vomiting, and  vomiting blood.      Reports diarrhea and  hemorrhoids.     Denies anal fissure, black tarry stools, change in bowel habit, constipation, diverticulosis, fecal incontinence, heme positive stool, irritable bowel syndrome, jaundice, light color stool, liver problems, rectal bleeding, and  rectal pain.    Prior Medications Reviewed Using: Patient Recall  Updated Prior Medication List: LORAZEPAM 0.5 MG  TABS (LORAZEPAM) TAKE AS DIRECTED LISINOPRIL-HYDROCHLOROTHIAZIDE 20-12.5 MG  TABS (LISINOPRIL-HYDROCHLOROTHIAZIDE) 1/2 by mouth qd ZYRTEC ALLERGY 10 MG  TABS (CETIRIZINE HCL) 1 by mouth once daily as needed ASPIRIN ADULT LOW STRENGTH 81 MG  TBEC (ASPIRIN) 1 by mouth once daily MULTIVITAMINS   TABS (MULTIPLE VITAMIN) Take 1 tablet by mouth once a day GLUCOSAMINE-CHONDROITIN  500-400 MG  CAPS (GLUCOSAMINE-CHONDROITIN) 1 by mouth once daily CALCIUM 500/D 500-200 MG-UNIT  TABS (CALCIUM CARBONATE-VITAMIN D) 1 by mouth once daily KAPIDEX 60 MG  CPDR (DEXLANSOPRAZOLE) 1 by mouth before breakfast  Current Allergies (reviewed today): No known allergies  Past Medical History:    Hypertension (09/11/2005)    elevated homocysteine level    Anxiety    thyroid goiter/nodule -- rt benign colloid nodule -- left hyperplastic nodule    OA- shoulder pain, knee pain after TKR L, Hx of BAKER'S CYST     Goiter    Hx adenomatous colon polyps  Past Surgical History:    Hysterectomy and oophorectomy    knee replacement (left) 2007, f/u w/ Dr Despina Hick    facelift    incisional hernia;     Thyroid bx 2006: neg    Colon polypectomy 2002; neg 7/09    knee surgery x 5   Family History:    Reviewed history from 01/27/2008 and no changes required:       breast ca--no       CAD-- F CABG at age 10s, Brother       Family History of Colon Cancer: Father (mets from prostate)       Family History of Prostate Cancer: Father        Family History of Clotting disorder: Father       Family History of Colon Polyps: Brother, Father  Social History:    Reviewed history from 01/27/2008 and no changes required:       Married       2 children  Occupation: Retired Runner, broadcasting/film/video       Patient is a former smoker. -Stopped 1985       Alcohol Use - yes-2 glasses daily       Daily Caffeine Use-1 cup daily       Illicit Drug Use - no       Patient gets regular exercise.   Risk Factors:  Tobacco use:  quit    Year quit:  1982 Drug use:  no Alcohol use:  yes Exercise:  yes  Review of Systems       The patient complains of arthritis/joint pain, depression-new, fatigue, and muscle pains/cramps.         The pertinent positives and negatives are noted as above and in the HPI. All other ROS were negative.   Vital Signs:  Patient Profile:   70 Years Old Female Height:     64.75  inches Weight:      177.13 pounds BMI:     29.81 BSA:     1.88 Pulse rate:   56 / minute Pulse rhythm:   regular BP sitting:   122 / 70  (right arm)  Vitals Entered By: Hortense Ramal CMA (April 30, 2008 8:56 AM)                  Physical Exam  Head:     Normocephalic and atraumatic. Eyes:     PERRLA, no icterus. Mouth:     No deformity or lesions, dentition normal. Lungs:     Clear throughout to auscultation. Heart:     Regular rate and rhythm; no murmurs, rubs,  or bruits. Abdomen:     Soft,  mild RLQ tenderness and nondistended. No masses, hepatosplenomegaly or hernias noted. Normal bowel sounds.without guarding and without rebound.   Neurologic:     Alert and  oriented x4;  grossly normal neurologically. Psych:     Alert and cooperative. Normal mood and affect.   Impression & Recommendations:  Problem # 1:  ABDOMINAL PAIN, EPIGASTRIC (ICD-789.06) Rule out ulcer disease, gastritis, duodenitis, esophagitis and GERD.discontinued Kapidex. Begin Protonix 40 mg p.o. q.a.m. Remain off Naprosyn. Stool Hemoccult result pending.consider an abdominal and pelvic CT scan and further neurologic evaluation per Dr. Drue Novel pending results of endoscopy. Orders: EGD (EGD)   Problem # 2:  DIARRHEA (ICD-787.91) Suspected side effect of Kapidex. Discontinue Kapidex.  Problem # 3:  NAUSEA (ICD-787.02) As in problem #1. Orders: EGD (EGD)   Problem # 4:  PERSONAL HX COLONIC POLYPS (ICD-V12.72) Surveillance colonoscopy recommended in July 2014.  Problem # 5:  FAMILY HX COLON CANCER (ICD-V16.0) As in problem #4.   Patient Instructions: 1)  [facility] Patient Information Guide given to patient. 2)  Upper Endoscopy brochure given. 3)  Copy Sent To: [name] MD    ]

## 2010-10-11 NOTE — Letter (Signed)
Summary: Surgical Clearance/[facility]  Surgical Clearance/[facility]   Imported By: Lanelle Bal 07/17/2008 11:43:26  _____________________________________________________________________  External Attachment:    Type:   Image     Comment:   External Document

## 2010-10-11 NOTE — Progress Notes (Signed)
Summary: REFILL FOR LORAZEPAM  Phone Note Refill Request   Refills Requested: Medication #1:  LORAZEPAM 0.5 MG  TABS TAKE AS DIRECTED.   Last Refilled: 08/27/2007 RX RECEIVED VIA FAX FROM [facility] IN [place] IS [phone]  Initial call taken by: Job Founds,  December 02, 2007 10:25 AM  Follow-up for Phone Call        needs OV Follow-up by: [facility] E. Paz MD,  December 02, 2007 5:20 PM  Additional Follow-up for Phone Call Additional follow up Details #1::        denial sent to pharmacy ..................................................................Marland KitchenShary Decamp  December 03, 2007 1:26 PM

## 2010-10-11 NOTE — Progress Notes (Signed)
Summary: Protonix   Phone Note Call from Patient Call back at Home Phone [phone]   Call For: DR STARK Reason for Call: Refill Medication Summary of Call: Told to use Protonix- was given samples but they are all gone. Can we call  some in to [facility] on [address] please. Initial call taken by: Leanor Kail [facility],  May 14, 2008 8:52 AM  Follow-up for Phone Call        Rx was sent to pts pharmacy. Pt notified.  Follow-up by: Christie Nottingham CMA,  May 14, 2008 10:19 AM    New/Updated Medications: PROTONIX 40 MG TBEC (PANTOPRAZOLE SODIUM) one tablet by mouth once daily   Prescriptions: PROTONIX 40 MG TBEC (PANTOPRAZOLE SODIUM) one tablet by mouth once daily  #30 x 11   Entered by:   Christie Nottingham CMA   Authorized by:   Meryl Dare MD [facility]   Signed by:   Christie Nottingham CMA on 05/14/2008   Method used:   Electronically to        [id number] (retail)       [address]       [place], [place]  [address]       Ph: [phone] or [phone]       Fax: [phone]   RxID:   [id number]

## 2010-10-11 NOTE — Consult Note (Signed)
Summary: planning a thyroid bx -----[facility]   Imported By: Lanelle Bal 05/20/2010 09:13:25  _____________________________________________________________________  External Attachment:    Type:   Image     Comment:   External Document

## 2010-10-11 NOTE — Letter (Signed)
Summary: Results Follow up Letter   at [place]  [address] [place], [place] [address]   Phone: [phone]  Fax: [phone]    03/16/2009 MRN: [id number]  [facility] [address] [place], [place]  [address]  Dear [name],  The following are the results of your recent test(s):  Test         Result    Pap Smear:        Normal _____  Not Normal _____ Comments: ______________________________________________________ Cholesterol: LDL(Bad cholesterol):         Your goal is less than:         HDL (Good cholesterol):       Your goal is more than: Comments:  ______________________________________________________ Mammogram:        Normal _____  Not Normal _____ Comments:  ___________________________________________________________________ Hemoccult:        Normal _____  Not normal _______ Comments:    _____________________________________________________________________ Other Tests:  Dr. Drue Novel has reviewed your lab results.  All labs were normal -- potassium was normal.  Please call me if you have any questions. Alena Bills [phone] ext [phone]

## 2010-10-11 NOTE — Letter (Signed)
Summary: Generic Letter   at [place]  [address] [place], [place] [address]   Phone: [phone]  Fax: [phone]     08/17/2010  REF:  [facility] [address] [place], [place]  [address]  Dr Trudee Grip  Dear Homero Fellers:  This letter is in reference to our mutual patient [name](DOB 12/25/1940). I saw her today for her pre-operative clearance visit. She is doing well. There is no evidence of heart disease by history or physical exam.   Her EKG is normal.  At this point, she is cleared for surgery.  I recommend her to continue taking all her regular medicines but also to take metoprolol 25 mg one tablet twice a day 2 days prior and 2 days after her surgery.  She knows to stop aspirin before the surgery.  I will enclose my last office visit note.  Please don't hesitate to call if you need more information.   Sincerely,        [name] MD

## 2010-10-11 NOTE — Progress Notes (Signed)
Summary: spoke with patient   Phone Note Outgoing Call   Details for Reason: needs office visit before we can refill lorazepam ..................................................................Marland KitchenShary Decamp  November 27, 2007 3:41 PM   Follow-up for Phone Call        left a message on voicemail for patient to return my call to set up a roa ..................................................................Marland KitchenCharolette Child  November 28, 2007 2:10 PM  Additional Follow-up for Phone Call Additional follow up Details #1::        patient has an appointment on 5.18.09 for a EMP. and she would rather wait until that day, patient states that she doesnt take the medication all the time so she would have enough medication to last her. ..................................................................Marland KitchenCharolette Child  December 02, 2007 11:10 AM Additional Follow-up by: Charolette Child,  December 02, 2007 11:09 AM

## 2010-10-11 NOTE — Progress Notes (Signed)
Summary: lab results  Phone Note Outgoing Call Call back at Home Phone [phone] Call back at Work Phone [phone]   Details for Reason: LAB RESULTS: tell patient labs ok (CBG 122, non fasting) Fax my OV note and labs  to ortho clearing her for surgery Signed by [facility] E. Paz MD on 07/17/2008 at 9:42 AM Summary of Call: discussed with patient faxed to Dr. Lequita Halt......Marland KitchenShary Decamp  July 17, 2008 11:16 AM

## 2010-10-11 NOTE — Miscellaneous (Signed)
Summary: flu vaccine given @ [facility]   Influenza Immunization History:    Influenza # 1:  flu vaccine given @ [facility] (05/28/2008)

## 2010-10-11 NOTE — Letter (Signed)
Summary: Results Follow up Letter  [place] at [place]  [address] [place], [place] [address]   Phone: [phone]  Fax: [phone]    05/14/2007 MRN: [id number]  [facility] [address] [place], [place]  [address]  Dear Ms. Ericsson,  The following are the results of your recent test(s):  Test         Result    Pap Smear:        Normal _____  Not Normal _____ Comments: ______________________________________________________ Cholesterol: LDL(Bad cholesterol):         Your goal is less than:         HDL (Good cholesterol):       Your goal is more than: Comments:  ______________________________________________________ Mammogram:        Normal __x___  Not Normal _____ Comments:  _____next mammogram due in 1 year ______________________________________________________________ Hemoccult:        Normal _____  Not normal _______ Comments:    _____________________________________________________________________ Other Tests:    We routinely do not discuss normal results over the telephone.  If you desire a copy of the results, or you have any questions about this information we can discuss them at your next office visit.   Sincerely,

## 2010-10-11 NOTE — Assessment & Plan Note (Signed)
Summary: acute/spider bite toe red swollen/alr   Vital Signs:  Patient Profile:   70 Years Old Female Height:     64.75 inches Weight:      186 pounds Temp:     98.8 degrees F oral BP sitting:   140 / 80  Vitals Entered By: Shary Decamp (Feb 05, 2008 3:31 PM)                 Chief Complaint:  spider bite on rt great toe few days ago -- +red, +swollen, and +pain.  History of Present Illness: developed a blister after worked in the yard saw a spyder in her shoe has drained clear fluid from the blister    Current Allergies: No known allergies       Physical Exam  General:     alert and well-developed.   Extremities:     1x 1.5 cm blister at dorsum of the R great  toe, slt redness around, otherwise no swelling    Impression & Recommendations:  Problem # 1:  CELLULITIS, FOOT (ICD-682.7) minimal cellulitis around a blister lesion (from rubbing w/ her shoe or from a bite) kflex--Abx ointment--elevate leg call if no better Her updated medication list for this problem includes:    Keflex 500 Mg Caps (Cephalexin) .Marland Kitchen... 1 by mouth qid   Complete Medication List: 1)  Lorazepam 0.5 Mg Tabs (Lorazepam) .... Take as directed 2)  Lisinopril-hydrochlorothiazide 20-12.5 Mg Tabs (Lisinopril-hydrochlorothiazide) .Marland Kitchen.. 1 by mouth qd 3)  Zyrtec Allergy 10 Mg Tabs (Cetirizine hcl) .Marland Kitchen.. 1 by mouth once daily as needed 4)  Naproxen Dr 500 Mg Tbec (Naproxen) .... As needed 5)  Asa  6)  Mvi  7)  Keflex 500 Mg Caps (Cephalexin) .Marland Kitchen.. 1 by mouth qid    Prescriptions: KEFLEX 500 MG  CAPS (CEPHALEXIN) 1 by mouth qid  #20 x 0   Entered and Authorized by:   Nolon Rod. Paz MD   Signed by:   Nolon Rod. Paz MD on 02/05/2008   Method used:   Printed then faxed to ...       [facility]  [address] [id number]       [address]       [place], [place]  [address]       Ph: [phone] or [phone]       Fax: [phone]   RxID:   [id number]  ]

## 2010-10-11 NOTE — Letter (Signed)
Summary: Results Follow up Letter  [place] at [place]  [address] [place], [place] [address]   Phone: [phone]  Fax: [phone]    04/29/2008 MRN: [id number]  [facility] [address] [place], [place]  [address]  Dear Ms. Greenman,  The following are the results of your recent test(s):  Test         Result    Pap Smear:        Normal _____  Not Normal _____ Comments: ______________________________________________________ Cholesterol: LDL(Bad cholesterol):         Your goal is less than:         HDL (Good cholesterol):       Your goal is more than: Comments:  ______________________________________________________ Mammogram:        Normal _____  Not Normal _____ Comments:  ___________________________________________________________________ Hemoccult:        Normal _____  Not normal _______ Comments:    _____________________________________________________________________ Other Tests:  All your labwork came back normal.  Please follow up with Dr. Drue Novel in 1 month.  Please call me if you have any questions or concerns. Alena Bills

## 2010-10-11 NOTE — Assessment & Plan Note (Signed)
Summary: surgical clearance//lch   Vital Signs:  Patient profile:   70 year old female Weight:      175.13 pounds Pulse rate:   62 / minute Pulse rhythm:   regular BP sitting:   130 / 84  (left arm) Cuff size:   large  Vitals Entered By: Army Fossa CMA (August 17, 2010 9:30 AM) CC: Surgical Clearance- surgery Jan 16 Comments not fasting  [facility] [address]    History of Present Illness: here for surgical clearance needs a   L knee operation by Dr. Despina Hick , she had extensive surgical history in that knee. PSH updated He reports moderate to severe pain in the left knee, pain is ongoing, unable to exercise as much as she would like. She had a hip replacement on 3-11, she tolerated the procedure well and there were no medical complications .    Current Medications (verified): 1)  Lorazepam 0.5 Mg  Tabs (Lorazepam) .... Take As Directed 2)  Lisinopril-Hydrochlorothiazide 20-12.5 Mg  Tabs (Lisinopril-Hydrochlorothiazide) .... 1/2  By Mouth Once Daily 3)  Zyrtec Allergy 10 Mg  Tabs (Cetirizine Hcl) .Marland Kitchen.. 1 By Mouth Once Daily As Needed 4)  Aspirin Adult Low Strength 81 Mg  Tbec (Aspirin) .Marland Kitchen.. 1 By Mouth Once Daily 5)  Multivitamins   Tabs (Multiple Vitamin) .... Take 1 Tablet By Mouth Once A Day 6)  Calcium 500/d 500-200 Mg-Unit  Tabs (Calcium Carbonate-Vitamin D) .Marland Kitchen.. 1 By Mouth Once Daily 7)  Vimovo 500-20 Mg Tbec (Naproxen-Esomeprazole) .... Per Rheumatology 8)  Mg  Allergies (verified): 1)  ! Percocet 2)  ! Tramadol Hcl (Tramadol Hcl)  Past History:  Past Medical History: Reviewed history from 03/15/2010 and no changes required. Hypertension (09/11/2005) elevated homocysteine level Anxiety thyroid goiter/nodule -- rt benign colloid nodule -- left hyperplastic nodule - Thyroid bx 2006: neg OA- shoulder pain, knee pain after TKR L, Hx of BAKER'S CYST  Goiter Hx adenomatous colon polyps 03/2003  Past Surgical History: Hysterectomy and oophorectomy L knee  arthroscopy, then replacement (left) 2007 Dr Earlie Lou, then a "manipulation,  then redo TKR 11-09  w/ Dr Despina Hick Hip replacement  R (3-11) facelift incisional hernia repair  knee surgery x 5 shoulder surgery 06-2009  Social History: Reviewed history from 03/15/2010 and no changes required. 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 -Stopped 1985 Alcohol Use - yes-2 glasses daily Daily Caffeine Use-1 cup daily Illicit Drug Use - no Patient walks 2 miles  daily  diet-- healthy   Review of Systems General:  activity limited by knee pain, able to go up-down stairs , walks the dog half mile ----> no DOE-CP. CV:  Denies chest pain or discomfort, palpitations, and swelling of feet; no orthopnea  no PND no ambulatory BPs  . Resp:  Denies shortness of breath and wheezing; some cough, PN drip x few days d/t a cold, no chronic symptoms . GI:  Denies nausea and vomiting.  Physical Exam  General:  alert, well-developed, and well-nourished.   Neck:  no JVD at 45 Lungs:  normal respiratory effort, no intercostal retractions, no accessory muscle use, and normal breath sounds.   Heart:  normal rate, regular rhythm, no murmur, and no gallop.   Abdomen:  soft, non-tender, no distention, no masses, no guarding, and no rigidity.   Extremities:  no lower extremity edema Psych:  Oriented X3, memory intact for recent and remote, normally interactive, good eye contact, not anxious appearing, and not depressed appearing.  Impression & Recommendations:  Problem # 1:  PREOPERATIVE EXAMINATION (ICD-V72.84) patient in need of knee surgery no history of coronary artery disease, has not have previously a stress test She feels well, review of systems negative She tolerated major surgery 11-2009 with no problems we discussed pre-operative stress test, patient doing well, states like to do it only if is "really needed" EKG today-- normal  Plan: Cleared for  surgery labs perioperative beta blockers, metoprolol 25  1 tab  two times a day... 2 days prior and 2 days after surgery  see letter.  Orders: EKG w/ Interpretation ([address])  Problem # 2:  OSTEOARTHRITIS (ICD-715.90) see #1 Her updated medication list for this problem includes:    Aspirin Adult Low Strength 81 Mg Tbec (Aspirin) .Marland Kitchen... 1 by mouth once daily    Vimovo 500-20 Mg Tbec (Naproxen-esomeprazole) .Marland Kitchen... Per rheumatology  Problem # 3:  HYPERTENSION (ICD-401.9) at goal  Her updated medication list for this problem includes:    Lisinopril-hydrochlorothiazide 20-12.5 Mg Tabs (Lisinopril-hydrochlorothiazide) .Marland Kitchen... 1/2  by mouth once daily    Metoprolol Tartrate 25 Mg Tabs (Metoprolol tartrate) .Marland Kitchen... 1 tab  two times a day... 2 days prior and 2 days after surgery  BP today: 130/84 Prior BP: 126/82 (03/15/2010)  Prior 10 Yr Risk Heart Disease: Not enough information (04/17/2008)  Labs Reviewed: K+: 4.7 (03/15/2010) Creat: : 0.7 (03/15/2010)   Chol: 249 (03/15/2010)   HDL: 98.60 (03/15/2010)   LDL: DEL (01/27/2008)   TG: 118.0 (03/15/2010)  Orders: Venipuncture ([address]) Specimen Handling ([address]) TLB-BMP (Basic Metabolic Panel-BMET) (80048-METABOL) TLB-CBC Platelet - w/Differential (85025-CBCD)  Problem # 4:  F2F > 25 min   Complete Medication List: 1)  Lorazepam 0.5 Mg Tabs (Lorazepam) .... Take as directed 2)  Lisinopril-hydrochlorothiazide 20-12.5 Mg Tabs (Lisinopril-hydrochlorothiazide) .... 1/2  by mouth once daily 3)  Zyrtec Allergy 10 Mg Tabs (Cetirizine hcl) .Marland Kitchen.. 1 by mouth once daily as needed 4)  Aspirin Adult Low Strength 81 Mg Tbec (Aspirin) .Marland Kitchen.. 1 by mouth once daily 5)  Multivitamins Tabs (Multiple vitamin) .... Take 1 tablet by mouth once a day 6)  Calcium 500/d 500-200 Mg-unit Tabs (Calcium carbonate-vitamin d) .Marland Kitchen.. 1 by mouth once daily 7)  Vimovo 500-20 Mg Tbec (Naproxen-esomeprazole) .... Per rheumatology 8)  Mg  9)  Metoprolol Tartrate 25 Mg Tabs  (Metoprolol tartrate) .Marland Kitchen.. 1 tab  two times a day... 2 days prior and 2 days after surgery   Patient Instructions: 1)  Please schedule a follow-up appointment in 4 months .  2)  no aspirin 7 days prior to surgery Prescriptions: METOPROLOL TARTRATE 25 MG TABS (METOPROLOL TARTRATE) 1 tab  two times a day... 2 days prior and 2 days after surgery  #20 x 0   Entered and Authorized by:   Nolon Rod. Paz MD   Signed by:   Nolon Rod. Paz MD on 08/17/2010   Method used:   Print then Give to Patient   RxID:   [id number]    Orders Added: 1)  Venipuncture [30865] 2)  Specimen Handling [99000] 3)  EKG w/ Interpretation [93000] 4)  TLB-BMP (Basic Metabolic Panel-BMET) [80048-METABOL] 5)  TLB-CBC Platelet - w/Differential [85025-CBCD] 6)  Est. Patient Level IV [78469]

## 2010-10-11 NOTE — Assessment & Plan Note (Signed)
Summary: ROB FOR SURGUERY CLEARANCE//PH   Vital Signs:  Patient Profile:   70 Years Old Female Height:     64.75 inches Weight:      182 pounds Pulse rate:   70 / minute Pulse rhythm:   regular BP sitting:   130 / 70  (left arm) Cuff size:   large  Vitals Entered By: Shary Decamp (July 14, 2008 1:12 PM)                 PCP:  [name], MD  Chief Complaint:  surgical clearance for knee replacement 08/17/08.  History of Present Illness: surgical clearance for knee replacement 08/17/08 c/o severe L knee pain no previous h/o of CAD, no previous stress tests has a FH of a bleeding d/o but she had several surgeries before and never had problems w/  bleeding --Also still has severe shoulder pain , wonders if she can increase clebrex as she is afraid of  getting addicted to vicodin  -- also reports that her GI issues are better      Updated Prior Medication List: LORAZEPAM 0.5 MG  TABS (LORAZEPAM) TAKE AS DIRECTED LISINOPRIL-HYDROCHLOROTHIAZIDE 20-12.5 MG  TABS (LISINOPRIL-HYDROCHLOROTHIAZIDE) 1/2 by mouth qd ZYRTEC ALLERGY 10 MG  TABS (CETIRIZINE HCL) 1 by mouth once daily as needed ASPIRIN ADULT LOW STRENGTH 81 MG  TBEC (ASPIRIN) 1 by mouth once daily MULTIVITAMINS   TABS (MULTIPLE VITAMIN) Take 1 tablet by mouth once a day CALCIUM 500/D 500-200 MG-UNIT  TABS (CALCIUM CARBONATE-VITAMIN D) 1 by mouth once daily VICODIN 5-500 MG TABS (HYDROCODONE-ACETAMINOPHEN) as needed for knee pain CELEBREX 200 MG CAPS (CELECOXIB) once daily  Current Allergies (reviewed today): No known allergies   Past Medical History:    Reviewed history from 05/27/2008 and no changes required:       Hypertension (09/11/2005)       elevated homocysteine level       Anxiety       thyroid goiter/nodule -- rt benign colloid nodule -- left hyperplastic nodule - Thyroid bx 2006: neg       OA- shoulder pain, knee pain after TKR L, Hx of BAKER'S CYST        Goiter       Hx adenomatous colon polyps  03/2003  Past Surgical History:    Reviewed history from 05/27/2008 and no changes required:       Hysterectomy and oophorectomy       knee replacement (left) 2007, f/u w/ Dr Despina Hick       facelift       incisional hernia repair        knee surgery x 5   Family History:    breast ca--no    CAD-- F CABG at age 66s, Brother    Family History of Colon Cancer: Father (mets from prostate)    Prostate Cancer: Father     Clotting disorder: Father    Colon Polyps: Brother, Father  Social History:    Reviewed history from 04/30/2008 and no changes required:       Married       2 children       Occupation: Retired Runner, broadcasting/film/video       Patient is a former smoker. -Stopped 1985       Alcohol Use - yes-2 glasses daily       Daily Caffeine Use-1 cup daily       Illicit Drug Use - no       Patient gets  regular exercise.   Risk Factors: Tobacco use:  quit    Year quit:  1982 Drug use:  no Alcohol use:  yes Exercise:  yes  Colonoscopy History:    Date of Last Colonoscopy:  03/31/2008  Mammogram History:    Date of Last Mammogram:  05/04/2008  PAP Smear History:    Date of Last PAP Smear:  01/27/2008   Review of Systems  CV      Denies chest pain or discomfort and swelling of feet.      (-) orthopnea ADL normal w/o limitations, no DOE  Resp      Denies cough, shortness of breath, and wheezing.  GI      Denies bloody stools, diarrhea, and nausea.   Physical Exam  General:     alert and well-developed.   Neck:     no JVD.   Lungs:     normal respiratory effort, no intercostal retractions, no accessory muscle use, and normal breath sounds.   Heart:     Regular rate and rhythm; no murmurs, rubs,  or bruits. Abdomen:     soft, non-tender, no distention, no hepatomegaly, and no splenomegaly.   Extremities:     no pretibial edema bilaterally  Calves symetric Psych:     Oriented X3, memory intact for recent and remote, normally interactive, good eye contact, not anxious  appearing, and not depressed appearing.      Impression & Recommendations:  Problem # 1:  PREOPERATIVE EXAMINATION (ICD-V72.84) CHART REVIEWED  ROS (-) for CAD, CHF EKG: NSR, no acute, low voltage QRS (TSH Normal 8-09) present and old EKG reviewed w/  cards: they look ok the patient is very active, only limited by pain and a very good funtional status she  is cleared for surgery pending labs start metoprolol 25 two times a day 2 days prior and post surgically   Problem # 2:  HYPERTENSION (ICD-401.9) stable Her updated medication list for this problem includes:    Lisinopril-hydrochlorothiazide 20-12.5 Mg Tabs (Lisinopril-hydrochlorothiazide) .Marland Kitchen... 1/2 by mouth qd    Metoprolol Tartrate 25 Mg Tabs (Metoprolol tartrate) .Marland Kitchen... 1 by mouth two times a day 2 days prio and post surgery on your knee  Orders: EKG w/ Interpretation (93000) Venipuncture (36644) TLB-BMP (Basic Metabolic Panel-BMET) (80048-METABOL) TLB-CBC Platelet - w/Differential (85025-CBCD)   Problem # 3:  OSTEOARTHRITIS (ICD-715.90) in need of knee surgery, see #1 also d/w patient pain mamagment, I prefer her to use vicodin at night (pain is worse at PM) rather than increase celebrex to call for RFs  Her updated medication list for this problem includes:    Aspirin Adult Low Strength 81 Mg Tbec (Aspirin) .Marland Kitchen... 1 by mouth once daily    Vicodin 5-500 Mg Tabs (Hydrocodone-acetaminophen) .Marland Kitchen... 1 or 2 by mouth every 6 hours as needed pain    Celebrex 200 Mg Caps (Celecoxib) ..... Once daily   Problem # 4:  F2F >>25 min  Complete Medication List: 1)  Lorazepam 0.5 Mg Tabs (Lorazepam) .... Take as directed 2)  Lisinopril-hydrochlorothiazide 20-12.5 Mg Tabs (Lisinopril-hydrochlorothiazide) .... 1/2 by mouth qd 3)  Zyrtec Allergy 10 Mg Tabs (Cetirizine hcl) .Marland Kitchen.. 1 by mouth once daily as needed 4)  Aspirin Adult Low Strength 81 Mg Tbec (Aspirin) .Marland Kitchen.. 1 by mouth once daily 5)  Multivitamins Tabs (Multiple vitamin) ....  Take 1 tablet by mouth once a day 6)  Calcium 500/d 500-200 Mg-unit Tabs (Calcium carbonate-vitamin d) .Marland Kitchen.. 1 by mouth once daily 7)  Vicodin 5-500  Mg Tabs (Hydrocodone-acetaminophen) .Marland Kitchen.. 1 or 2 by mouth every 6 hours as needed pain 8)  Celebrex 200 Mg Caps (Celecoxib) .... Once daily 9)  Metoprolol Tartrate 25 Mg Tabs (Metoprolol tartrate) .Marland Kitchen.. 1 by mouth two times a day 2 days prio and post surgery on your knee   Patient Instructions: 1)  Please schedule a follow-up appointment in 4 months.   Prescriptions: VICODIN 5-500 MG TABS (HYDROCODONE-ACETAMINOPHEN) 1 or 2 by mouth every 6 hours as needed pain  #100 x 0   Entered and Authorized by:   Elita Quick E. Paz MD   Signed by:   Nolon Rod. Paz MD on 07/14/2008   Method used:   Print then Give to Patient   RxID:   [id number] METOPROLOL TARTRATE 25 MG TABS (METOPROLOL TARTRATE) 1 by mouth two times a day 2 days prio and post surgery on your knee  #60 x 0   Entered and Authorized by:   Nolon Rod. Paz MD   Signed by:   Nolon Rod. Paz MD on 07/14/2008   Method used:   Print then Give to Patient   RxID:   [id number]  ]

## 2010-10-11 NOTE — Letter (Signed)
Summary: *Referral Letter  [facility]  [address] [place], [place] [address]   Phone: [phone]  Fax: [phone]    03/21/2010 Surgery, Dr Gerrit Friends  Thank you in advance for agreeing to see my patient:  [facility] [address] [place], [place]  [address]  Phone: [phone]  Reason for Referral:   [name]  is a very nice 70 year old  lady who had a bilateral thyroid  biopsy in 2007 with benign results. Few days ago during her physical, I noted that R sided  nodule was increase in size. Ultrasound confirmed my findings ( see report). the nodule is now 2 cm. Please advised [place]  if  is necessary to do a new biopsy or simply observe with serial ultrasounds.   Current Medications: 1)  LORAZEPAM 0.5 MG  TABS (LORAZEPAM) TAKE AS DIRECTED 2)  LISINOPRIL-HYDROCHLOROTHIAZIDE 20-12.5 MG  TABS (LISINOPRIL-HYDROCHLOROTHIAZIDE) 1/2  by mouth once daily 3)  ZYRTEC ALLERGY 10 MG  TABS (CETIRIZINE HCL) 1 by mouth once daily as needed 4)  ASPIRIN ADULT LOW STRENGTH 81 MG  TBEC (ASPIRIN) 1 by mouth once daily 5)  MULTIVITAMINS   TABS (MULTIPLE VITAMIN) Take 1 tablet by mouth once a day 6)  CALCIUM 500/D 500-200 MG-UNIT  TABS (CALCIUM CARBONATE-VITAMIN D) 1 by mouth once daily 7)  VIMOVO 500-20 MG TBEC (NAPROXEN-ESOMEPRAZOLE) per rheumatology   Past Medical History: 1)  Hypertension (09/11/2005) 2)  elevated homocysteine level 3)  Anxiety 4)  thyroid goiter/nodule -- rt benign colloid nodule -- left hyperplastic nodule - Thyroid bx 2006: neg 5)  OA- shoulder pain, knee pain after TKR L, Hx of BAKER'S CYST  6)  Goiter 7)  Hx adenomatous colon polyps 03/2003     Thank you again for agreeing to see our patient; please contact us if you have any further questions or need additional information.  Sincerely,  Artie Mcintyre E. Danyon Mcginness MD

## 2010-10-11 NOTE — Assessment & Plan Note (Signed)
Summary: SICK-- UPSET STOMACH//VGJ   Vital Signs:  Patient Profile:   70 Years Old Female Height:     64.75 inches Weight:      182.8 pounds Temp:     98.2 degrees F oral Pulse rate:   64 / minute BP sitting:   124 / 72  (left arm) Cuff size:   large  Vitals Entered By: Shonna Chock (April 17, 2008 2:08 PM)                 Chief Complaint:  NAUSEATED and FATIGUE X 2 WEEKS-? IF RELATED TO ANY MEDS.  History of Present Illness: "On the verge of queasy & too tired to deal with grandchildren over 2 weeks" On Naproxen for 2&1/2 years ; she takes Naproxen at bedtime & rarely during day. (Black Box Warning reviewed) No PMH of ulcer.She decreased ACE-I due to lethargy; no cough. BP stable with decrease in dose.  Hypertension History:      She complains of side effects from treatment, but denies headache, chest pain, palpitations, dyspnea with exertion, orthopnea, PND, peripheral edema, visual symptoms, neurologic problems, and syncope.  She notes the following problems with antihypertensive medication side effects: See HPI.  Further comments include: BP controlled .        Positive major cardiovascular risk factors include female age 49 years old or older and hypertension.  Negative major cardiovascular risk factors include non-tobacco-user status.       Current Allergies: No known allergies   Past Medical History:    Hypertension (09/11/2005)    elevated homocysteine level    Anxiety    thyroid goiter/nodule -- rt benign colloid nodule -- left hyperplastic nodule    OA- shoulder pain, knee pain after TKR L, Hx of BAKER'S CYST     Goiter  Past Surgical History:    Hysterectomy    knee replacement (left) 2007, f/u w/ Dr Despina Hick    facelift    Oophorectomy    incisional hernia ; Thyroid bx 2006: neg    Colon polypectomy 2002; neg 7/09     Review of Systems  General      Denies chills, fever, sweats, and weight loss.  ENT      Denies difficulty swallowing and  hoarseness.  Resp      Denies cough, shortness of breath, and sputum productive.  GI      See HPI      Denies abdominal pain, bloody stools, constipation, dark tarry stools, diarrhea, and indigestion.  Derm      Denies changes in nail beds, dryness, and hair loss.  Neuro      Denies numbness and tingling.  Psych      Complains of anxiety and irritability.      Denies depression, easily angered, and easily tearful.  Endo      Denies cold intolerance, excessive hunger, excessive thirst, excessive urination, heat intolerance, polyuria, and weight change.  Allergy      Complains of itching eyes and seasonal allergies.      Denies hives or rash and sneezing.      on Zyrtec. No angioedema on ACE-I   Physical Exam  General:     well-nourished,in no acute distress; alert,appropriate and cooperative throughout examination Eyes:     No corneal or conjunctival inflammation noted .Perrla. No icterus; lid lag Mouth:     Oral mucosa and oropharynx without lesions or exudates.  Teeth in good repair.pharynx pink and moist.   Neck:  Goiter R > L Lungs:     Normal respiratory effort, chest expands symmetrically. Lungs are clear to auscultation, no crackles or wheezes. Heart:     Normal rate and regular rhythm. S1 and S2 normal without gallop, murmur, click, rub or other extra sounds. Abdomen:     Bowel sounds positive,abdomen soft and non-tender without masses, organomegaly or hernias noted. Rectal:     Given stool cards Pulses:     R and L carotid,radial,dorsalis pedis and posterior tibial pulses are full and equal bilaterally Extremities:     Op scar L knee with decreased DTR Neurologic:     alert & oriented X3.   Skin:     Intact without suspicious lesions or rashes Cervical Nodes:     No lymphadenopathy noted Axillary Nodes:     No palpable lymphadenopathy Psych:     memory intact for recent and remote, normally interactive, good eye contact, not anxious appearing, and  not depressed appearing.      Impression & Recommendations:  Problem # 1:  FATIGUE (ICD-780.79)  Orders: Venipuncture ([address]) TLB-CBC Platelet - w/Differential (85025-CBCD) TLB-TSH (Thyroid Stimulating Hormone) (84443-TSH) TLB-T4 (Thyrox), Free [phone])   Problem # 2:  NAUSEA (ICD-787.02)  Her updated medication list for this problem includes:    Zyrtec Allergy 10 Mg Tabs (Cetirizine hcl) .Marland Kitchen... 1 by mouth once daily as needed  Orders: Venipuncture (19147)   Problem # 3:  ANXIETY (ICD-300.00)  Her updated medication list for this problem includes:    Lorazepam 0.5 Mg Tabs (Lorazepam) .Marland Kitchen... Take as directed  Orders: Venipuncture (82956)   Problem # 4:  HYPERTENSION (ICD-401.9)  Her updated medication list for this problem includes:    Lisinopril-hydrochlorothiazide 20-12.5 Mg Tabs (Lisinopril-hydrochlorothiazide) .Marland Kitchen... 1 by mouth qd  Orders: Venipuncture (21308) TLB-Creatinine, Blood (82565-CREA) TLB-Potassium (K+) (84132-K) TLB-BUN (Urea Nitrogen) (84520-BUN)   Complete Medication List: 1)  Lorazepam 0.5 Mg Tabs (Lorazepam) .... Take as directed 2)  Lisinopril-hydrochlorothiazide 20-12.5 Mg Tabs (Lisinopril-hydrochlorothiazide) .Marland Kitchen.. 1 by mouth qd 3)  Zyrtec Allergy 10 Mg Tabs (Cetirizine hcl) .Marland Kitchen.. 1 by mouth once daily as needed 4)  Naproxen Dr 500 Mg Tbec (Naproxen) .... As needed 5)  Aspirin Adult Low Strength 81 Mg Tbec (Aspirin) .Marland Kitchen.. 1 by mouth once daily 6)  Mvi  7)  Glucosamine-chondroitin 500-400 Mg Caps (Glucosamine-chondroitin) .Marland Kitchen.. 1 by mouth once daily 8)  Calcium 500/d 500-200 Mg-unit Tabs (Calcium carbonate-vitamin d) .Marland Kitchen.. 1 by mouth once daily 9)  Tramadol Hcl 50 Mg Tabs (Tramadol hcl) .Marland Kitchen.. 1 q 6-8 hrs as needed pain 10)  Zantac 150 Maximum Strength 150 Mg Tabs (Ranitidine hcl) .Marland Kitchen.. 1 two times a day pre meals  Hypertension Assessment/Plan:      The patient's hypertensive risk group is category B: At least one risk factor (excluding diabetes)  with no target organ damage.  Today's blood pressure is 124/72.     Patient Instructions: 1)  Complete stool cards . Citalopram could be considered if labs normal.   Prescriptions: LORAZEPAM 0.5 MG  TABS (LORAZEPAM) TAKE AS DIRECTED  #100 x 0   Entered and Authorized by:   Marga Melnick MD   Signed by:   Marga Melnick MD on 04/17/2008   Method used:   Electronically sent to ...       [facility]  [address] [id number]       [address]       [place], [place]  [address]  Ph: [phone] or [phone]       Fax: [phone]   RxID:   [id number] ZANTAC 150 MAXIMUM STRENGTH 150 MG  TABS (RANITIDINE HCL) 1 two times a day pre meals  #60 x 0   Entered and Authorized by:   Marga Melnick MD   Signed by:   Marga Melnick MD on 04/17/2008   Method used:   Electronically sent to ...       [facility]  [address] [id number]*       [address]       [place], [place]  [address]       Ph: [phone] or [phone]       Fax: [phone]   RxID:   [id number] TRAMADOL HCL 50 MG  TABS (TRAMADOL HCL) 1 q 6-8 hrs as needed pain  #30 x 0   Entered and Authorized by:   Marga Melnick MD   Signed by:   Marga Melnick MD on 04/17/2008   Method used:   Electronically sent to ...       [facility]  [address] [facility]       [address]       [place], [place]  [address]       Ph: [phone] or [phone]       Fax: [phone]   RxID:   [id number]  ]

## 2010-10-11 NOTE — Miscellaneous (Signed)
Summary: Gastric Emptying Study  Clinical Lists Changes  Orders: Added new Test order of Gastric Emptying Scan (GES) - Signed

## 2010-10-11 NOTE — Miscellaneous (Signed)
Summary: h1n1 given @ [facility]   Influenza Immunization History:    Influenza # 1:  h1n1 given @ [facility] (09/25/2008)

## 2010-10-11 NOTE — Letter (Signed)
Summary: [place] No Show Letter  [facility]  [address] [place], [place] [address]   Phone: [phone]  Fax: [phone]    02/04/2010 MRN: [id number]  [facility] [address] [place], [place]  [address]   Dear Ms. Angelino,   Our records indicate that you missed your scheduled appointment with Dr. Drue Novel on 02/04/10.  Please contact this office to reschedule your appointment as soon as possible.  It is important that you keep your scheduled appointments with your physician, so we can provide you the best care possible.  Please be advised that there may be a charge for "no show" appointments.    Sincerely,   [facility]

## 2010-10-11 NOTE — Letter (Signed)
Summary: Surgical Clearance/[facility]  Surgical Clearance/[facility]   Imported By: Lanelle Bal 07/05/2010 11:26:05  _____________________________________________________________________  External Attachment:    Type:   Image     Comment:   External Document

## 2010-10-11 NOTE — Assessment & Plan Note (Signed)
Summary: surgical clearance//lh   Vital Signs:  Patient profile:   70 year old female Height:      64.75 inches Weight:      181.6 pounds Pulse rate:   64 / minute BP sitting:   130 / 60  Vitals Entered By: Shary Decamp (October 25, 2009 11:20 AM) CC: surgical clearance - rt total hip   History of Present Illness: OA--in need of R total hip   Hypertension-- cut lisinopril to 1/2 pill b/c she felt lethargic , no ambulatory BPs   Anxiety-- well controlled     Current Medications (verified): 1)  Lorazepam 0.5 Mg  Tabs (Lorazepam) .... Take As Directed 2)  Lisinopril-Hydrochlorothiazide 20-12.5 Mg  Tabs (Lisinopril-Hydrochlorothiazide) .Marland Kitchen.. 1 By Mouth Once Daily 3)  Zyrtec Allergy 10 Mg  Tabs (Cetirizine Hcl) .Marland Kitchen.. 1 By Mouth Once Daily As Needed 4)  Aspirin Adult Low Strength 81 Mg  Tbec (Aspirin) .Marland Kitchen.. 1 By Mouth Once Daily 5)  Multivitamins   Tabs (Multiple Vitamin) .... Take 1 Tablet By Mouth Once A Day 6)  Calcium 500/d 500-200 Mg-Unit  Tabs (Calcium Carbonate-Vitamin D) .Marland Kitchen.. 1 By Mouth Once Daily 7)  Meloxicam .... Per Ortho  Allergies (verified): 1)  ! Percocet  Past History:  Past Medical History: Hypertension (09/11/2005) elevated homocysteine level Anxiety thyroid goiter/nodule -- rt benign colloid nodule -- left hyperplastic nodule - Thyroid bx 2006: neg OA- shoulder pain, knee pain after TKR L, Hx of BAKER'S CYST  Goiter Hx adenomatous colon polyps 03/2003  Past Surgical History: Hysterectomy and oophorectomy knee replacement (left) 2007 Dr Earlie Lou then redo TKR 11-09  w/ Dr Despina Hick facelift incisional hernia repair  knee surgery x 5 shoulder surgery 06-2009  Family History: Reviewed history from 07/14/2008 and no changes required. breast ca--no CAD-- F CABG at age 21s, Brother Family History of Colon Cancer: Father (mets from prostate) Prostate Cancer: Father  Clotting disorder: Father Colon Polyps: Brother, Father  Social History: Reviewed history  from 04/30/2008 and no changes required. 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 -Stopped 1985 Alcohol Use - yes-2 glasses daily Daily Caffeine Use-1 cup daily Illicit Drug Use - no Patient used to exercise , not able recently d/t pain   Review of Systems       denies chest pain, shortness of  breath or orthopnea no lower extremity edema or pulse denies fevers no nausea vomiting or diarrhea she is able to do all her activities of daily living ,  the only limiting factor is pain  Physical Exam  General:  alert, well-developed, and well-nourished.   Neck:  no JVD at 45 degrees Lungs:  normal respiratory effort, no intercostal retractions, no accessory muscle use, and normal breath sounds.   Heart:  normal rate, regular rhythm, no murmur, and no gallop.   Abdomen:  soft, non-tender, no distention, and no masses.   Extremities:  no pretibial edema bilaterally  Psych:  Cognition and judgment appear intact. Alert and cooperative with normal attention span and concentration.    Impression & Recommendations:  Problem # 1:  PREOPERATIVE EXAMINATION (ICD-V72.84) patient needs  a  hip replacement, I discussed with her the option of having a stress test before surgery She feels very well and thinks it is not  necessary. the review of systems is actually negative, no evidence of CAD, CHF or COPD per history she declined to have the EKG today but we reviewed her most recent [facility] (October 2010):  sinus bradycardia plan: Cleared for surgery Will recommend perioperative beta-blockers: start metoprolol 25 two times a day 2 days prior and post surgically   Problem # 2:  HYPERTENSION (ICD-401.9) taking only half of her lisinopril-HCT, good control  Her updated medication list for this problem includes:    Lisinopril-hydrochlorothiazide 20-12.5 Mg Tabs (Lisinopril-hydrochlorothiazide) .Marland Kitchen... 1/2  by mouth once daily    Metoprolol Tartrate 25 Mg Tabs  (Metoprolol tartrate) .Marland Kitchen... 1 tab  two times a day... 2 days prior and 2 days after hip replacement  BP today: 130/60 Prior BP: 122/80 (03/10/2009)  Prior 10 Yr Risk Heart Disease: Not enough information (04/17/2008)  Labs Reviewed: K+: 4.1 (03/10/2009) Creat: : 0.8 (03/10/2009)   Chol: 251 (01/27/2008)   HDL: 88.6 (01/27/2008)   LDL: DEL (01/27/2008)   TG: 117 (01/27/2008)  Orders: Venipuncture ([address]) TLB-BMP (Basic Metabolic Panel-BMET) (80048-METABOL) TLB-CBC Platelet - w/Differential (85025-CBCD)  Problem # 3:  OSTEOARTHRITIS (ICD-715.90) see #1 Her updated medication list for this problem includes:    Aspirin Adult Low Strength 81 Mg Tbec (Aspirin) .Marland Kitchen... 1 by mouth once daily  Problem # 4:  ANXIETY (ICD-300.00) well controlled  Her updated medication list for this problem includes:    Lorazepam 0.5 Mg Tabs (Lorazepam) .Marland Kitchen... Take as directed  Problem # 5:  HEALTH SCREENING (ICD-V70.0) due for a CPX, patient aware   Complete Medication List: 1)  Lorazepam 0.5 Mg Tabs (Lorazepam) .... Take as directed 2)  Lisinopril-hydrochlorothiazide 20-12.5 Mg Tabs (Lisinopril-hydrochlorothiazide) .... 1/2  by mouth once daily 3)  Zyrtec Allergy 10 Mg Tabs (Cetirizine hcl) .Marland Kitchen.. 1 by mouth once daily as needed 4)  Aspirin Adult Low Strength 81 Mg Tbec (Aspirin) .Marland Kitchen.. 1 by mouth once daily 5)  Multivitamins Tabs (Multiple vitamin) .... Take 1 tablet by mouth once a day 6)  Calcium 500/d 500-200 Mg-unit Tabs (Calcium carbonate-vitamin d) .Marland Kitchen.. 1 by mouth once daily 7)  Meloxicam  .... Per ortho 8)  Metoprolol Tartrate 25 Mg Tabs (Metoprolol tartrate) .Marland Kitchen.. 1 tab  two times a day... 2 days prior and 2 days after hip replacement  Patient Instructions: 1)  patient is clear for surgery 2)  I recommend to start metoprolol 25 two times a day 2 days prior and post surgically . prescription provided Prescriptions: METOPROLOL TARTRATE 25 MG TABS (METOPROLOL TARTRATE) 1 tab  two times a day... 2 days  prior and 2 days after hip replacement  #30 x 0   Entered and Authorized by:   Nolon Rod. Joaovictor Krone MD   Signed by:   Nolon Rod. Jacque Byron MD on 10/25/2009   Method used:   Print then Give to Patient   RxID:   [id number] LORAZEPAM 0.5 MG  TABS (LORAZEPAM) TAKE AS DIRECTED  #100 x 1   Entered by:   Shary Decamp   Authorized by:   Nolon Rod. Darnella Zeiter MD   Signed by:   Shary Decamp on 10/25/2009   Method used:   Print then Give to Patient   RxID:   [id number]

## 2010-10-11 NOTE — Miscellaneous (Signed)
Summary: [facility] Previsit/prep  Clinical Lists Changes  Medications: Added new medication of MOVIPREP 100 GM  SOLR (PEG-KCL-NACL-NASULF-NA ASC-C) As per prep instructions. - Signed Rx of MOVIPREP 100 GM  SOLR (PEG-KCL-NACL-NASULF-NA ASC-C) As per prep instructions.;  #1 x 0;  Signed;  Entered by: Wyona Almas RN;  Authorized by: Meryl Dare MD [facility];  Method used: Electronic Observations: Added new observation of NKA: T (03/24/2008 15:51)    Prescriptions: MOVIPREP 100 GM  SOLR (PEG-KCL-NACL-NASULF-NA ASC-C) As per prep instructions.  #1 x 0   Entered by:   Wyona Almas RN   Authorized by:   Meryl Dare MD [facility]   Signed by:   Wyona Almas RN on 03/24/2008   Method used:   Electronically sent to ...       [facility]  [address] # [id number]*       [address]       [place], [place]  [address]       Ph: [phone] or [phone]       Fax: [phone]   RxID:   [id number]

## 2010-10-11 NOTE — Assessment & Plan Note (Signed)
Summary: med rf   Vital Signs:  Patient profile:   70 year old female Height:      64.75 inches Weight:      180.8 pounds BMI:     30.43 Pulse rate:   72 / minute Pulse rhythm:   regular BP sitting:   122 / 80  (left arm) Cuff size:   large  Vitals Entered By: Shary Decamp (March 10, 2009 3:05 PM)   History of Present Illness: ROV HTN-- no recent ambulatory BPs  anxiety-- needs RF  Current Medications (verified): 1)  Lorazepam 0.5 Mg  Tabs (Lorazepam) .... Take As Directed 2)  Lisinopril-Hydrochlorothiazide 20-12.5 Mg  Tabs (Lisinopril-Hydrochlorothiazide) .Marland Kitchen.. 1 By Mouth Once Daily 3)  Zyrtec Allergy 10 Mg  Tabs (Cetirizine Hcl) .Marland Kitchen.. 1 By Mouth Once Daily As Needed 4)  Aspirin Adult Low Strength 81 Mg  Tbec (Aspirin) .Marland Kitchen.. 1 By Mouth Once Daily 5)  Multivitamins   Tabs (Multiple Vitamin) .... Take 1 Tablet By Mouth Once A Day 6)  Calcium 500/d 500-200 Mg-Unit  Tabs (Calcium Carbonate-Vitamin D) .Marland Kitchen.. 1 By Mouth Once Daily 7)  Meloxicam .... Per Ortho  Allergies (verified): 1)  ! Percocet  Past History:  Past Medical History: Reviewed history from 05/27/2008 and no changes required. Hypertension (09/11/2005) elevated homocysteine level Anxiety thyroid goiter/nodule -- rt benign colloid nodule -- left hyperplastic nodule - Thyroid bx 2006: neg OA- shoulder pain, knee pain after TKR L, Hx of BAKER'S CYST  Goiter Hx adenomatous colon polyps 03/2003  Past Surgical History: Hysterectomy and oophorectomy knee replacement (left) 2007 Dr Earlie Lou then redo TKR 11-09  w/ Dr Despina Hick facelift incisional hernia repair  knee surgery x 5  Review of Systems       s/p redo TKR, doing great, celebrex was not helping , now on meloxicam CV:  Denies chest pain or discomfort, shortness of breath with exertion, and swelling of feet. Resp:  Denies cough and shortness of breath.  Physical Exam  General:  alert and well-developed.   Lungs:  normal respiratory effort, no intercostal  retractions, no accessory muscle use, and normal breath sounds.   Heart:  Regular rate and rhythm; no murmurs, rubs,  or bruits. Extremities:  no pretibial edema bilaterally  Psych:  Cognition and judgment appear intact. Alert and cooperative with normal attention span and concentration.  not anxious appearing and not depressed appearing.     Impression & Recommendations:  Problem # 1:  OSTEOARTHRITIS (ICD-715.90) s/p L TKR redo, doing great The following medications were removed from the medication list:    Vicodin 5-500 Mg Tabs (Hydrocodone-acetaminophen) .Marland Kitchen... 1 or 2 by mouth every 6 hours as needed pain    Celebrex 200 Mg Caps (Celecoxib) ..... Once daily Her updated medication list for this problem includes:    Aspirin Adult Low Strength 81 Mg Tbec (Aspirin) .Marland Kitchen... 1 by mouth once daily  Problem # 2:  ANXIETY (ICD-300.00) stable, RF Her updated medication list for this problem includes:    Lorazepam 0.5 Mg Tabs (Lorazepam) .Marland Kitchen... Take as directed  Problem # 3:  HYPERTENSION (ICD-401.9) occasionally   has cramps at night, checking a BMP today at goal  The following medications were removed from the medication list:    Metoprolol Tartrate 25 Mg Tabs (Metoprolol tartrate) .Marland Kitchen... 1 by mouth two times a day 2 days prio and post surgery on your knee Her updated medication list for this problem includes:    Lisinopril-hydrochlorothiazide 20-12.5 Mg Tabs (Lisinopril-hydrochlorothiazide) .Marland Kitchen... 1 by  mouth once daily  BP today: 122/80 Prior BP: 130/70 (07/14/2008)  Prior 10 Yr Risk Heart Disease: Not enough information (04/17/2008)  Labs Reviewed: K+: 3.8 (07/14/2008) Creat: : 0.7 (07/14/2008)   Chol: 251 (01/27/2008)   HDL: 88.6 (01/27/2008)   LDL: DEL (01/27/2008)   TG: 117 (01/27/2008)  Orders: Venipuncture ([address]) TLB-BMP (Basic Metabolic Panel-BMET) (80048-METABOL)  Complete Medication List: 1)  Lorazepam 0.5 Mg Tabs (Lorazepam) .... Take as directed 2)   Lisinopril-hydrochlorothiazide 20-12.5 Mg Tabs (Lisinopril-hydrochlorothiazide) .Marland Kitchen.. 1 by mouth once daily 3)  Zyrtec Allergy 10 Mg Tabs (Cetirizine hcl) .Marland Kitchen.. 1 by mouth once daily as needed 4)  Aspirin Adult Low Strength 81 Mg Tbec (Aspirin) .Marland Kitchen.. 1 by mouth once daily 5)  Multivitamins Tabs (Multiple vitamin) .... Take 1 tablet by mouth once a day 6)  Calcium 500/d 500-200 Mg-unit Tabs (Calcium carbonate-vitamin d) .Marland Kitchen.. 1 by mouth once daily 7)  Meloxicam  .... Per ortho  Patient Instructions: 1)  Please schedule a follow-up appointment in 4 months  (yearly, fasting)      Prescriptions: LORAZEPAM 0.5 MG  TABS (LORAZEPAM) TAKE AS DIRECTED  #100 x 1   Entered and Authorized by:   Nolon Rod. Paz MD   Signed by:   Nolon Rod. Paz MD on 03/10/2009   Method used:   Print then Give to Patient   RxID:   [id number] LISINOPRIL-HYDROCHLOROTHIAZIDE 20-12.5 MG  TABS (LISINOPRIL-HYDROCHLOROTHIAZIDE) 1 by mouth once daily  #30 x 6   Entered by:   Shary Decamp   Authorized by:   Nolon Rod. Paz MD   Signed by:   Shary Decamp on 03/10/2009   Method used:   Electronically to        [facility] # [id number]* (retail)       [address]       [place], [place]  [address]       Ph: [phone] or [phone]       Fax: [phone]   RxID:   [id number]    Preventive Care Screening  Prior Values:    Pap Smear:  normal (01/27/2008)    Mammogram:  normal (05/04/2008)    Colonoscopy:  Location:  [facility].   (03/31/2008)    Bone Density:  normal (06/06/2005)    Last Tetanus Booster:  Td (01/09/2002)    Last Flu Shot:  h1n1 given @ [facility] (09/25/2008)    Last Pneumovax:  Pneumovax (01/27/2008)    Dexa Interp:  normal (06/06/2005)

## 2010-10-11 NOTE — Procedures (Signed)
Summary: Colonoscopy   Colonoscopy  Procedure date:  03/31/2008  Findings:      Location:  [facility].    Procedures Next Due Date:    Colonoscopy: 04/2013  Patient Name: [name], [name] MRN:  Procedure Procedures: Colorectal cancer screening, high risk CPT: G0105.  Personnel: Endoscopist: Venita Lick. Russella Dar, MD, Clementeen Graham.  Exam Location: Exam performed in Outpatient Clinic. Outpatient  Patient Consent: Procedure, Alternatives, Risks and Benefits discussed, consent obtained, from patient. Consent was obtained by the RN.  Indications  Surveillance of: Adenomatous Polyp(s). Initial polypectomy was performed in 2004. in Jul.  Increased Risk Screening: For family history of colorectal neoplasia, in  parent age at onset: 70s. Family History of Polyps.  Comments: Father with colon cancer. Brother with colon polyps. History  Current Medications: Patient is not currently taking Coumadin.  Pre-Exam Physical: Performed Mar 31, 2008. Cardio-pulmonary exam, Rectal exam, HEENT exam , Abdominal exam, Mental status exam WNL.  Comments: Pt. history reviewed/updated, physical exam performed prior to initiation of sedation?Yes Exam Exam: Extent of exam reached: Cecum, extent intended: Cecum.  The cecum was identified by appendiceal orifice and IC valve. Time to Cecum: 00:02: 20. Time for Withdrawl: 00:07:25. Colon retroflexion performed. Images taken. ASA Classification: II. Tolerance: excellent.  Monitoring: Pulse and BP monitoring, Oximetry used. Supplemental O2 given.  Colon Prep Used MoviPrep for colon prep. Prep results: excellent.  Sedation Meds: Patient assessed and found to be appropriate for moderate (conscious) sedation. Fentanyl 75 mcg. given IV. Versed 7 mg. given IV.  Findings NORMAL EXAM: Cecum to Rectum.   Assessment Normal examination.  Events  Unplanned Interventions: No intervention was required.  Unplanned Events: There were no  complications. Plans  Post Exam Instructions: Post sedation instructions given.  Disposition: After procedure patient sent to recovery. After recovery patient sent home.  Scheduling/Referral: Colonoscopy, to [facility] T. Russella Dar, MD, [facility], around Mar 31, 2013.      cc.   Elita Quick Paz,MD

## 2010-10-11 NOTE — Miscellaneous (Signed)
Summary: flu shot @ [facility]    Immunization History:  Influenza Immunization History:    Influenza:  given @ [facility]  (06/06/2010)

## 2010-10-11 NOTE — Assessment & Plan Note (Signed)
Summary: yearly check up--tl   Vital Signs:  Patient Profile:   70 Years Old Female Height:     64.75 inches Weight:      18.6 pounds Pulse rate:   60 / minute BP sitting:   110 / 72  Vitals Entered By: Shary Decamp (Jan 27, 2008 9:16 AM)                 Chief Complaint:  yearly -- fasting.  History of Present Illness: yearly no concerns except her weight OA--continue w/ pain  ANXIETY-- sx ok HYPERTENSION--  BPs "ok"  when go to other doctors      Updated Prior Medication List: LORAZEPAM 0.5 MG  TABS (LORAZEPAM) TAKE AS DIRECTED LISINOPRIL-HYDROCHLOROTHIAZIDE 20-12.5 MG  TABS (LISINOPRIL-HYDROCHLOROTHIAZIDE) 1 by mouth qd * ASA  * MVI  ZYRTEC ALLERGY 10 MG  TABS (CETIRIZINE HCL) 1 by mouth once daily as needed NAPROXEN DR 500 MG  TBEC (NAPROXEN) as needed  Current Allergies (reviewed today): No known allergies   Past Medical History:    Reviewed history from 12/20/2006 and no changes required:       Hypertension (09/11/2005)       elevated homocysteine level       Anxiety       thyroid goiter/nodule -- rt benign colloid nodule -- left hyperplastic nodule       OA- shoulder pain, knee pain after TKR L, Hx of BAKER'S CYST          Past Surgical History:    Reviewed history and no changes required:       Hysterectomy       knee replacement (left) 2007, f/u w/ Dr Despina Hick       facelift       Oophorectomy       incisional hernia   Family History:    colon ca--F    breast ca--no    CAD-- F CABG at age 65s  Social History:    Married    2 children   Risk Factors:  Alcohol use:  yes    Comments:  social  Mammogram History:     Date of Last Mammogram:  04/09/2006    Results:  normal   Colonoscopy History:     Date of Last Colonoscopy:  04/08/2003    Results:  polyps -- next colon due 7/07    Review of Systems  CV      Denies chest pain or discomfort and swelling of feet.  Resp      Denies cough and shortness of breath.  GI  Denies bloody stools and diarrhea.  GU      Denies dysuria and urinary hesitancy.   Physical Exam  General:     alert and well-developed.   Neck:     .8 cm soft mass at R  thyroid Breasts:     No mass, nodules, thickening, tenderness, bulging, retraction, inflamation, nipple discharge or skin changes noted.   Lungs:     normal respiratory effort, no intercostal retractions, no accessory muscle use, and normal breath sounds.   Heart:     normal rate, regular rhythm, no murmur, and no gallop.   Abdomen:     soft, non-tender, no hepatomegaly, and no splenomegaly.   Rectal:     No external abnormalities noted. Normal sphincter tone. No rectal masses or tenderness. Hem neg Genitalia:     normal introitus, no external lesions, no vaginal discharge, and no friaility or hemorrhage.  slt  athrophy noted (vagina) bimanual: normal, absent uterus Extremities:     no edema Psych:     Oriented X3, memory intact for recent and remote, normally interactive, good eye contact, not anxious appearing, and not depressed appearing.      Impression & Recommendations:  Problem # 1:  HEALTH SCREENING (ICD-V70.0) multiple neg MMG last 7-07 (pt reports she had one in 2008) last Colonoscopy:  04/08/2003  (Results:  polyps -- next colon due 7/07 ) Pt reports Dr Russella Dar extended time for next Cscope Will contact GI DEXA 2006--normal, will discuss on RTC about next DEXA last PAP 2003: neg  s/p Hysterectomy for bening reasons, we agreed to do a PAP q 3 years---PAP done today diet-exercise pneumonia shot Shingles shot--had in 2008 Orders: TLB-ALT (SGPT) (84460-ALT) TLB-AST (SGOT) (84450-SGOT)   Problem # 2:  HYPERTENSION (ICD-401.9) Assessment: Unchanged  Her updated medication list for this problem includes:    Lisinopril-hydrochlorothiazide 20-12.5 Mg Tabs (Lisinopril-hydrochlorothiazide) .Marland Kitchen... 1 by mouth qd  Orders: TLB-BMP (Basic Metabolic Panel-BMET) (80048-METABOL) TLB-CBC Platelet -  w/Differential (85025-CBCD) TLB-TSH (Thyroid Stimulating Hormone) (84443-TSH) TLB-Lipid Panel (80061-LIPID) Venipuncture ([address])  BP today: 110/72  Labs Reviewed: Creat: 0.7 (10/22/2006) Chol: 236 (01/11/2007)   HDL: 82.4 (01/11/2007)   LDL: DEL (01/11/2007)   TG: 78 (01/11/2007)   Problem # 3:  OSTEOARTHRITIS (ICD-715.90) sees DR Alusio Her updated medication list for this problem includes:    Naproxen Dr 500 Mg Tbec (Naproxen) .Marland Kitchen... As needed   Problem # 4:  ANXIETY (ICD-300.00) RF Her updated medication list for this problem includes:    Lorazepam 0.5 Mg Tabs (Lorazepam) .Marland Kitchen... Take as directed   Problem # 5:  F2F > 25 min  Complete Medication List: 1)  Lorazepam 0.5 Mg Tabs (Lorazepam) .... Take as directed 2)  Lisinopril-hydrochlorothiazide 20-12.5 Mg Tabs (Lisinopril-hydrochlorothiazide) .Marland Kitchen.. 1 by mouth qd 3)  Zyrtec Allergy 10 Mg Tabs (Cetirizine hcl) .Marland Kitchen.. 1 by mouth once daily as needed 4)  Naproxen Dr 500 Mg Tbec (Naproxen) .... As needed 5)  Asa  6)  Mvi   Other Orders: Pneumococcal Vaccine (62130) Admin 1st Vaccine (86578)   Patient Instructions: 1)  Please schedule a follow-up appointment in 6 months.   Prescriptions: LISINOPRIL-HYDROCHLOROTHIAZIDE 20-12.5 MG  TABS (LISINOPRIL-HYDROCHLOROTHIAZIDE) 1 by mouth qd  #30 x 12   Entered and Authorized by:   Jose E. Paz MD   Signed by:   Nolon Rod. Paz MD on 01/27/2008   Method used:   Print then Give to Patient   RxID:   [id number] LORAZEPAM 0.5 MG  TABS (LORAZEPAM) TAKE AS DIRECTED  #100 x 0   Entered and Authorized by:   Nolon Rod. Paz MD   Signed by:   Nolon Rod. Paz MD on 01/27/2008   Method used:   Print then Give to Patient   RxID:   [id number]  ]  Pneumovax Vaccine    Vaccine Type: Pneumovax    Site: left deltoid    Mfr: Merck    Dose: 0.5 ml    Route: IM    Given by: Floydene Flock CMA    Exp. Date: 02/14/2009    Lot #: [id number]    Preventive Care Screening  Mammogram:    Date:   04/09/2006    Results:  normal   Bone Density:    Date:  06/06/2005    Results:  normal std dev  Colonoscopy:    Date:  04/08/2003    Results:  polyps -- next colon due 7/07  Appended Document: yearly check up--tl Ref. COLONOSCOPY ---- 01/29/2008 9:28 AM, Darcey Nora RN wrote: She is due sept 09, she will get a letter in August to schedule

## 2010-10-11 NOTE — Letter (Signed)
[facility]   Imported By: Freddy Jaksch 07/31/2007 15:52:38  _____________________________________________________________________  External Attachment:    Type:   Image     Comment:   External Document

## 2010-10-11 NOTE — Progress Notes (Signed)
Summary: RECALL COLON IN JULY 09??  Phone Note Call from Patient Call back at Home Phone [phone] Call back at OR CELL [phone]   Call For: DR STARK Reason for Call: Talk to Nurse Summary of Call: RECEIVED RECALL COLON LETTER. WANTS TO MAKE SURE SHE IS DUE IN FIVE YEARS. THOUGHT THE DOCTOR HAD TOLD HER 18YRS. NOT ARGUING WITH DR OPINION JUST WANTS TO MAKE SURE ITS NOT AN OVERSIGHT.  Initial call taken by: Leanor Kail [facility],  February 26, 2008 9:35 AM  Follow-up for Phone Call        pt advised of recall per office note from 11/07 recall date was changed to July of 09, colon scheduled for 03-31-08 11:30 [facility], previsit 03-24-08 4:00- Follow-up by: Darcey Nora RN,  February 26, 2008 10:21 AM

## 2010-10-11 NOTE — Procedures (Signed)
Summary: EGD   EGD  Procedure date:  05/01/2008  Findings:      Location: [facility]    Patient Name: Stephanny, Tsutsui. MRN:  Procedure Procedures: Panendoscopy (EGD) CPT: 43235.    with biopsy(s)/brushing(s). CPT: D1846139.  Personnel: Endoscopist: Venita Lick. Russella Dar, MD, Clementeen Graham.  Exam Location: Exam performed in Outpatient Clinic. Outpatient  Patient Consent: Procedure, Alternatives, Risks and Benefits discussed, consent obtained, from patient. Consent was obtained by the RN.  Indications Symptoms: Weight loss. Nausea. Abdominal pain, location: epigastric.  History  Current Medications: Patient is not currently taking Coumadin.  Pre-Exam Physical: Performed May 01, 2008  Cardio-pulmonary exam, HEENT exam, Abdominal exam, Mental status exam WNL.  Comments: Pt. history reviewed/updated, physical exam performed prior to initiation of sedation?Yes Exam Exam Info: Maximum depth of insertion Duodenum, intended Duodenum. Vocal cords not visualized. Gastric retroflexion performed. Images taken. ASA Classification: II. Tolerance: excellent.  Sedation Meds: Patient assessed and found to be appropriate for moderate (conscious) sedation. Fentanyl 50 mcg. given IV. Versed 5 mg. given IV. Cetacaine Spray 2 sprays given aerosolized.  Monitoring: BP and pulse monitoring done. Oximetry used. Supplemental O2 given  Findings Normal: Proximal Esophagus to Fundus.  MUCOSAL ABNORMALITY: Body. Erythematous mucosa. Granular mucosa. Biopsy/Mucosal Abn taken. ICD9: Gastritis, Unspecified: 535.50.  MUCOSAL ABNORMALITY: Pyloric Sphincter to Antrum. Erosions present. Erythematous mucosa. Friable mucosa. taken. ICD9: Gastritis, Unspecified: 535.50.  Normal: Duodenal Bulb to Duodenal 2nd Portion.   Assessment  Diagnoses: 535.50: Gastritis, Unspecified.   Events  Unplanned Intervention: No unplanned interventions were required.  Unplanned Events: There were no  complications. Plans Medication(s): Await pathology. PPI: Pantoprazole/Protonix 40 mg QAM,   Patient Education: Patient given standard instructions for: Mucosal Abnormality.  Disposition: After procedure patient sent to recovery. After recovery patient sent home.  Scheduling: Office Visit, to [employer]. Russella Dar, MD, [facility], around May 20, 2008.  Gastric Emptying Study, next available appt.    cc:  [name], MD  REPORT OF SURGICAL PATHOLOGY   Case #: [id number] Patient Name: Goodroe, Owen P. Office Chart Number:  [id number]   MRN: [id number] Pathologist: Alden Server A. Delila Spence, MD DOB/Age  [birth year] (Age: 72)    Gender: F Date Taken:  05/01/2008 Date Received: 05/04/2008   FINAL DIAGNOSIS   ***MICROSCOPIC EXAMINATION AND DIAGNOSIS***   STOMACH, BIOPSIES:   - GASTRIC ANTRAL-TYPE MUCOSA AND ONE FRAGMENT OF GASTRIC BODY-TYPE MUCOSA WITH  FOCAL EROSION AND CHRONIC ACTIVE GASTRITIS.  ASSOCIATED REACTIVE EPITHELIAL  CHANGES. - NO HELICOBACTER PYLORI ORGANISMS IDENTIFIED. - NO INTESTINAL METAPLASIA, DYSPLASIA, OR MALIGNANCY IDENTIFIED.      COMMENT A Warthin-Starry stain is performed to determine the possibility of the presence of Helicobacter pylori. The Warthin-Starry stain is negative for organisms of Helicobacter pylori. The control(s) stained appropriately.   gdt Date Reported:  05/05/2008     Alden Server A. Delila Spence, MD *** Electronically Signed Out By EAA ***  May 05, 2008 MRN: [id number]    [facility] [place] [address] [place], [place]  [address]    Dear Ms. Gorney,  I am pleased to inform you that the biopsies taken during your recent endoscopic examination did not show any evidence of cancer upon pathologic examination. The biopsies showed gastritis.   Continue with the treatment plan as outlined on the day of your      exam.  Please call us if you are having persistent problems or have questions about your condition that have not been fully answered at this  time.  Sincerely,  Meryl Dare MD  FACG  This letter has been electronically signed by your physician  This report was created from the original endoscopy report, which was reviewed and signed by the above listed endoscopist.

## 2010-10-11 NOTE — Assessment & Plan Note (Signed)
Summary: rov/swh   Vital Signs:  Patient Profile:   70 Years Old Female Height:     64.75 inches Weight:      177 pounds Pulse rate:   64 / minute BP sitting:   130 / 80  Vitals Entered By: Shary Decamp (April 27, 2008 9:04 AM)                 Chief Complaint:  rov -- since last ov still having nausea & fatigue; discuss thyroid.  History of Present Illness: NAUSEA  x 3 weeks, steady, worse w/  food denies abd. pain, had some diarrhea w/o blood admits w/ occ HAs that are uncommon for her was seen last week: self d/c all meds prescribed then due to s/e (ultram--disoriented, Celexa--fatigue) Did stop NSAIDs as recommended she also self decrease lisinopril to half but that did not help the nausea  ALSO CONT. WITH FATIGUE x 3 weeks  "If I'm depress is b/c i'm tired" ...denies depression per se denies CP DOE, stress is at baseline, denies wt loss  discuss thyroid-- could it account for her Sx??    Updated Prior Medication List: LORAZEPAM 0.5 MG  TABS (LORAZEPAM) TAKE AS DIRECTED LISINOPRIL-HYDROCHLOROTHIAZIDE 20-12.5 MG  TABS (LISINOPRIL-HYDROCHLOROTHIAZIDE) 1/2 by mouth qd ZYRTEC ALLERGY 10 MG  TABS (CETIRIZINE HCL) 1 by mouth once daily as needed ASPIRIN ADULT LOW STRENGTH 81 MG  TBEC (ASPIRIN) 1 by mouth once daily * MVI  GLUCOSAMINE-CHONDROITIN 500-400 MG  CAPS (GLUCOSAMINE-CHONDROITIN) 1 by mouth once daily CALCIUM 500/D 500-200 MG-UNIT  TABS (CALCIUM CARBONATE-VITAMIN D) 1 by mouth once daily  Current Allergies (reviewed today): No known allergies   Past Medical History:    Reviewed history from 04/17/2008 and no changes required:       Hypertension (09/11/2005)       elevated homocysteine level       Anxiety       thyroid goiter/nodule -- rt benign colloid nodule -- left hyperplastic nodule       OA- shoulder pain, knee pain after TKR L, Hx of BAKER'S CYST        Goiter  Past Surgical History:    Reviewed history from 04/17/2008 and no changes  required:       Hysterectomy and oophorectomy       knee replacement (left) 2007, f/u w/ Dr Despina Hick       facelift       incisional hernia ; Thyroid bx 2006: neg       Colon polypectomy 2002; neg 7/09     Review of Systems      See HPI   Physical Exam  General:     alert and well-developed.   Lungs:     normal respiratory effort, no intercostal retractions, no accessory muscle use, and normal breath sounds.   Heart:     normal rate, regular rhythm, and no murmur.   Abdomen:     soft, no distention, no masses, no guarding, no hepatomegaly, and no splenomegaly.  Slt tender at the epigastrium Extremities:     no pretibial edema bilaterally  L knee slt swollen (s/p TKR,was recently seen by ortho) Psych:     Oriented X3, memory intact for recent and remote, normally interactive, good eye contact, not anxious appearing, and not depressed appearing.      Impression & Recommendations:  Problem # 1:  NAUSEA (ICD-787.02) nausea in the seeting of chronic NSAIDs use and mild tenderness in the epigastrium hemocults pending plan: u/s  GI referal, EGD? labs kapidex consider brain  MRI if GI w/u neg and sx persist (has a mild HA)  Her updated medication list for this problem includes:    Zyrtec Allergy 10 Mg Tabs (Cetirizine hcl) .Marland Kitchen... 1 by mouth once daily as needed  Orders: UA Dipstick w/o Micro (manual) (41324) TLB-Amylase (82150-AMYL) TLB-Lipase (83690-LIPASE) TLB-Hemoglobin (Hgb) (85018-HGB) Venipuncture (40102) TLB-Hepatic/Liver Function Pnl (80076-HEPATIC) Gastroenterology Referral (GI)   Problem # 2:  FATIGUE (ICD-780.79) hard to pinpoint etiology in light of normal labs re asses on RTC  Problem # 3:  OSTEOARTHRITIS (ICD-715.90) tylenol PRN   and vicodin if pain severe The following medications were removed from the medication list:    Naproxen Dr 500 Mg Tbec (Naproxen) .Marland Kitchen... As needed    Tramadol Hcl 50 Mg Tabs (Tramadol hcl) .Marland Kitchen... 1 q 6-8 hrs as needed  pain  Her updated medication list for this problem includes:    Aspirin Adult Low Strength 81 Mg Tbec (Aspirin) .Marland Kitchen... 1 by mouth once daily    Vicodin 5-500 Mg Tabs (Hydrocodone-acetaminophen) .Marland Kitchen... 1 by mouth every 4 hours as needed pain   Problem # 4:  F2F  30 min plus due to extensive pt's description of sx and chart review  Complete Medication List: 1)  Lorazepam 0.5 Mg Tabs (Lorazepam) .... Take as directed 2)  Lisinopril-hydrochlorothiazide 20-12.5 Mg Tabs (Lisinopril-hydrochlorothiazide) .... 1/2 by mouth qd 3)  Zyrtec Allergy 10 Mg Tabs (Cetirizine hcl) .Marland Kitchen.. 1 by mouth once daily as needed 4)  Aspirin Adult Low Strength 81 Mg Tbec (Aspirin) .Marland Kitchen.. 1 by mouth once daily 5)  Mvi  6)  Glucosamine-chondroitin 500-400 Mg Caps (Glucosamine-chondroitin) .Marland Kitchen.. 1 by mouth once daily 7)  Calcium 500/d 500-200 Mg-unit Tabs (Calcium carbonate-vitamin d) .Marland Kitchen.. 1 by mouth once daily 8)  Kapidex 60 Mg Cpdr (Dexlansoprazole) .Marland Kitchen.. 1 by mouth before breakfast 9)  Vicodin 5-500 Mg Tabs (Hydrocodone-acetaminophen) .Marland Kitchen.. 1 by mouth every 4 hours as needed pain   Patient Instructions: 1)  Please schedule a follow-up appointment in 1 month.   Prescriptions: VICODIN 5-500 MG  TABS (HYDROCODONE-ACETAMINOPHEN) 1 by mouth every 4 hours as needed pain  #60 x 0   Entered and Authorized by:   Nolon Rod. Isola Mehlman MD   Signed by:   Nolon Rod. Amyra Vantuyl MD on 04/27/2008   Method used:   Print then Give to Patient   RxID:   [id number] KAPIDEX 60 MG  CPDR (DEXLANSOPRAZOLE) 1 by mouth before breakfast  #30 x 6   Entered and Authorized by:   Nolon Rod. Ebany Bowermaster MD   Signed by:   Nolon Rod. Alvester Eads MD on 04/27/2008   Method used:   Print then Give to Patient   RxID:   [id number]  ] Laboratory Results   Urine Tests    Routine Urinalysis   Glucose: negative   (Normal Range: Negative) Bilirubin: negative   (Normal Range: Negative) Ketone: negative   (Normal Range: Negative) Spec. Gravity: 1.010   (Normal Range:  1.003-1.035) Blood: negative   (Normal Range: Negative) pH: 5.0   (Normal Range: 5.0-8.0) Protein: negative   (Normal Range: Negative) Urobilinogen: 0.2   (Normal Range: 0-1) Nitrite: negative   (Normal Range: Negative) Leukocyte Esterace: negative   (Normal Range: Negative)

## 2010-10-11 NOTE — Letter (Signed)
Summary: Results Follow-up Letter  [place] [facility]  [address] [place], [place] [address]   Phone: [phone]  Fax: [phone]    02/04/2008        [name]. [place] [address] [place], [place]  [address]  Dear Ms. Yeung,   The following are the results of your recent test(s):  Test     Result     Pap Smear    Normal_______  Not Normal_____       Comments: _________________________________________________________ Cholesterol LDL(Bad cholesterol):          Your goal is less than:         HDL (Good cholesterol):        Your goal is more than: _________________________________________________________ Other Tests:   _________________________________________________________  Please call for an appointment Or _________________________________________________________ _________________________________________________________ _________________________________________________________  Sincerely,  Ardyth Man [place] at [facility]

## 2010-10-12 NOTE — Discharge Summary (Signed)
[name], [name]                ACCOUNT NO.:  [id number]  MEDICAL RECORD NO.:  [id number]          PATIENT TYPE:  INP  LOCATION:  1615                         FACILITY:  [facility]  PHYSICIAN:  Ollen Gross, M.D.    DATE OF BIRTH:  December 06, 1940  DATE OF ADMISSION:  09/26/2010 DATE OF DISCHARGE:  09/28/2010                              DISCHARGE SUMMARY   ADMITTING DIAGNOSES: 1. Left knee instability/unstable left total knee. 2. Hypertension. 3. Elevated homocysteine levels. 4. Anxiety. 5. Thyroid goiter/nodule. 6. History of colonic polyps. 7. History of gastritis. 8. Varicose veins.  DISCHARGE DIAGNOSES: 1. Instability of left total knee arthroplasty and status post     polyethylene revision, left total knee. 2. Osteoarthritis, right knee, status post right knee cortisone     injection. 3. Hypertension. 4. Elevated homocysteine levels. 5. Anxiety. 6. Thyroid goiter/nodule. 7. History of colonic polyps. 8. History of gastritis. 9. Varicose veins.  PROCEDURE:  September 26, 2010:  Polyethylene revision, left total knee, with a right knee cortisone injection.  Surgeon:  Dr. Lequita Halt. Assistant:  Avel Peace, PA-C.  Anesthesia:  General.  Tourniquet time: 24 minutes.  CONSULTS:  None.  BRIEF HISTORY:  [name] is a 70 year old female with a left total knee revision done several years ago.  She did very well initially, but then started to develop some swelling and instability with the knee.  She did not recall any injury or accident.  Exam demonstrates global instability and presents for total knee revision versus a thicker polyethylene, depending on the intraoperative findings.  LABORATORY DATA:  Preop CBC showed a hemoglobin of 13.9, hematocrit of 42.2, white cell count 10.2, platelets 337.  Postop hemoglobin was 12.4 with a last noted H and H of 12.7 and 40.4.  PT/PTT on admission 13.8 and 27 with an INR of 1.04.  Serial pro times followed per Coumadin protocol.  Last  noted PT/INR 18.4 and 1.51.  Chem panel on admission: Elevated BUN of 30.  Remaining chem panel within normal limits.  Serial BMETs were followed for 48 hours.  Electrolytes remained within normal limits.  BUN came down to normal level of 12.  Glucose did go up from 98 to 152; it is back down to 105.  Preop UA negative.  Blood group type B negative.  Nasal swabs were positive for Staph aureus, but negative for MRSA.  HOSPITAL COURSE:  The patient admitted to [facility], taken to OR, underwent above-stated procedure without complication and tolerated procedure well.  Given 24 hours postop IV antibiotics.  Started on Coumadin for DVT prophylaxis.  On the morning of day #1, she was doing very well.  Pain was under good control.  We started therapy.  She is weightbearing as tolerated.  Hemoglobin looked good.  Discontinued the Foley.  She did well with physical therapy and by postop day #2, she was progressing with her mobility and wanted to go home.  DISCHARGE PLAN: 1. The patient discharged home on September 28, 2010. 2. Discharge diagnoses, please see above. 3. Medications at discharge:  Vicodin, Robaxin, Coumadin.  Continue     home meds  of aspirin, lisinopril/HCTZ, lorazepam, omeprazole, and     saline nasal spray.  DIET:  Heart-healthy diet.  ACTIVITY:  She is weightbearing as tolerated, total knee protocol, PT and RN.  FOLLOWUP:  2 weeks.  DISPOSITION:  Home.  CONDITION ON DISCHARGE:  Improved.     Alexzandrew L. Julien Girt, P.A.C.   ______________________________ Ollen Gross, M.D.    ALP/MEDQ  D:  10/06/2010  T:  10/06/2010  Job:  [id number]  Electronically Signed by Patrica Duel P.A.C. on 10/12/2010 40:34:74 PM Electronically Signed by Ollen Gross M.D. on 10/12/2010 03:39:36 PM

## 2010-10-13 NOTE — Progress Notes (Signed)
Summary: Pulse Concerns   Phone Note From Other Clinic Call back at [phone]   Caller: [name] Pre-surgery Testing, Cyndia Diver Summary of Call: Surgery date is 09/26/2010 with Dr. Despina Hick, Patient was at [facility]  yesterday and her heart rate was 53,54. They are concerned that heart rate was a little low. Patient told them she was taking metoprolol 100mg  but when they pulled up last OV from here patient list states 25mg . The patient was told to call back and let them know her exact dose when she checked her bottle but she never called back. Please follow-up with the patient and discuss with Dr.Arbie Reisz. Initial call taken by: Shonna Chock CMA,  September 23, 2010 10:45 AM  Follow-up for Phone Call        When I called pts home pt was not there. Per her husband he did not see Metoprolol in her meds at all. I called pt on her cell phone and left a message. Army Fossa CMA  September 23, 2010 10:51 AM   Additional Follow-up for Phone Call Additional follow up Details #1::        Left message for pt to call back on home and cell.  Army Fossa CMA  September 23, 2010 4:08 PM     Additional Follow-up for Phone Call Additional follow up Details #2::    I spoke w/ pt and she has the 25 mg. She states she has been instructed not to take the medication, unsure of who decided for her not to take it. But a nurse called her. Army Fossa CMA  September 23, 2010 4:18 PM  she was recommended to take metoprolol around the surgical time for heart protection , this was not given any medication. if her pulse was 53 , then  metoprolol may make it even lower. Hold metoprolol. Damian Hofstra E. Tavaris Eudy MD  September 23, 2010 4:31 PM    Made Pre-Surgery aware, pt is holding Metoprolol. Army Fossa CMA  September 23, 2010 4:33 PM

## 2010-10-13 NOTE — Consult Note (Signed)
Summary: local injection----dr [facility]   Imported By: Lanelle Bal 09/16/2010 10:28:06  _____________________________________________________________________  External Attachment:    Type:   Image     Comment:   External Document

## 2010-10-14 ENCOUNTER — Ambulatory Visit: Payer: Medicare Other | Attending: Orthopedic Surgery | Admitting: Physical Therapy

## 2010-10-14 DIAGNOSIS — IMO0001 Reserved for inherently not codable concepts without codable children: Secondary | ICD-10-CM | POA: Insufficient documentation

## 2010-10-14 DIAGNOSIS — R262 Difficulty in walking, not elsewhere classified: Secondary | ICD-10-CM | POA: Insufficient documentation

## 2010-10-14 DIAGNOSIS — M25669 Stiffness of unspecified knee, not elsewhere classified: Secondary | ICD-10-CM | POA: Insufficient documentation

## 2010-10-14 DIAGNOSIS — M25569 Pain in unspecified knee: Secondary | ICD-10-CM | POA: Insufficient documentation

## 2010-10-14 DIAGNOSIS — Z96659 Presence of unspecified artificial knee joint: Secondary | ICD-10-CM | POA: Insufficient documentation

## 2010-10-17 ENCOUNTER — Ambulatory Visit: Payer: Medicare Other | Admitting: Physical Therapy

## 2010-10-19 ENCOUNTER — Ambulatory Visit: Payer: Medicare Other | Admitting: Physical Therapy

## 2010-10-21 ENCOUNTER — Ambulatory Visit: Payer: Medicare Other | Admitting: Physical Therapy

## 2010-10-24 ENCOUNTER — Ambulatory Visit: Payer: Medicare Other | Admitting: Physical Therapy

## 2010-10-26 ENCOUNTER — Ambulatory Visit: Payer: Medicare Other | Admitting: Physical Therapy

## 2010-10-28 ENCOUNTER — Ambulatory Visit: Payer: Medicare Other | Admitting: Physical Therapy

## 2010-10-31 ENCOUNTER — Encounter: Payer: Medicare Other | Admitting: Physical Therapy

## 2010-11-02 ENCOUNTER — Ambulatory Visit: Payer: Medicare Other | Admitting: Physical Therapy

## 2010-11-04 ENCOUNTER — Encounter: Payer: Medicare Other | Admitting: Physical Therapy

## 2010-11-09 ENCOUNTER — Ambulatory Visit: Payer: Medicare Other | Admitting: Physical Therapy

## 2010-11-17 ENCOUNTER — Ambulatory Visit: Payer: Medicare Other | Attending: Orthopedic Surgery | Admitting: Physical Therapy

## 2010-11-17 DIAGNOSIS — Z96659 Presence of unspecified artificial knee joint: Secondary | ICD-10-CM | POA: Insufficient documentation

## 2010-11-17 DIAGNOSIS — M25669 Stiffness of unspecified knee, not elsewhere classified: Secondary | ICD-10-CM | POA: Insufficient documentation

## 2010-11-17 DIAGNOSIS — IMO0001 Reserved for inherently not codable concepts without codable children: Secondary | ICD-10-CM | POA: Insufficient documentation

## 2010-11-17 DIAGNOSIS — M25569 Pain in unspecified knee: Secondary | ICD-10-CM | POA: Insufficient documentation

## 2010-11-17 DIAGNOSIS — R262 Difficulty in walking, not elsewhere classified: Secondary | ICD-10-CM | POA: Insufficient documentation

## 2010-11-30 LAB — CBC
HCT: 44.5 % (ref 36.0–46.0)
Hemoglobin: 15.1 g/dL — ABNORMAL HIGH (ref 12.0–15.0)
MCHC: 33.9 g/dL (ref 30.0–36.0)
MCV: 93.8 fL (ref 78.0–100.0)
Platelets: 282 10*3/uL (ref 150–400)
RBC: 4.75 MIL/uL (ref 3.87–5.11)
RDW: 13.5 % (ref 11.5–15.5)
WBC: 9.2 10*3/uL (ref 4.0–10.5)

## 2010-11-30 LAB — COMPREHENSIVE METABOLIC PANEL
ALT: 18 U/L (ref 0–35)
AST: 24 U/L (ref 0–37)
Albumin: 4.3 g/dL (ref 3.5–5.2)
Alkaline Phosphatase: 72 U/L (ref 39–117)
BUN: 20 mg/dL (ref 6–23)
CO2: 30 mEq/L (ref 19–32)
Calcium: 9.6 mg/dL (ref 8.4–10.5)
Chloride: 101 mEq/L (ref 96–112)
Creatinine, Ser: 0.75 mg/dL (ref 0.4–1.2)
GFR calc Af Amer: >60 mL/min (ref >60)
GFR calc non Af Amer: >60 mL/min (ref >60)
Glucose, Bld: 79 mg/dL (ref 70–99)
Potassium: 4.1 mEq/L (ref 3.5–5.1)
Sodium: 140 mEq/L (ref 135–145)
Total Bilirubin: 0.6 mg/dL (ref 0.3–1.2)
Total Protein: 6.5 g/dL (ref 6.0–8.3)

## 2010-11-30 LAB — URINALYSIS, ROUTINE W REFLEX MICROSCOPIC
Bilirubin Urine: NEGATIVE
Glucose, UA: NEGATIVE mg/dL
Hgb urine dipstick: NEGATIVE
Nitrite: NEGATIVE
Protein, ur: NEGATIVE mg/dL
Specific Gravity, Urine: 1.018 (ref 1.005–1.030)
Urobilinogen, UA: 0.2 mg/dL (ref 0.0–1.0)
pH: 5.5 (ref 5.0–8.0)

## 2010-11-30 LAB — PROTIME-INR
INR: 0.97 (ref 0.00–1.49)
Prothrombin Time: 12.8 seconds (ref 11.6–15.2)

## 2010-11-30 LAB — TYPE AND SCREEN
ABO/RH(D): B NEG
Antibody Screen: NEGATIVE

## 2010-11-30 LAB — APTT: aPTT: 25 seconds (ref 24–37)

## 2010-12-04 LAB — CBC
HCT: 30.9 % — ABNORMAL LOW (ref 36.0–46.0)
HCT: 32.9 % — ABNORMAL LOW (ref 36.0–46.0)
HCT: 33.1 % — ABNORMAL LOW (ref 36.0–46.0)
Hemoglobin: 10.6 g/dL — ABNORMAL LOW (ref 12.0–15.0)
Hemoglobin: 11.3 g/dL — ABNORMAL LOW (ref 12.0–15.0)
Hemoglobin: 11.5 g/dL — ABNORMAL LOW (ref 12.0–15.0)
MCHC: 34.1 g/dL (ref 30.0–36.0)
MCHC: 34.2 g/dL (ref 30.0–36.0)
MCHC: 35.1 g/dL (ref 30.0–36.0)
MCV: 92.3 fL (ref 78.0–100.0)
MCV: 92.7 fL (ref 78.0–100.0)
MCV: 92.8 fL (ref 78.0–100.0)
Platelets: 200 10*3/uL (ref 150–400)
Platelets: 212 10*3/uL (ref 150–400)
Platelets: 212 10*3/uL (ref 150–400)
RBC: 3.34 MIL/uL — ABNORMAL LOW (ref 3.87–5.11)
RBC: 3.56 MIL/uL — ABNORMAL LOW (ref 3.87–5.11)
RBC: 3.57 MIL/uL — ABNORMAL LOW (ref 3.87–5.11)
RDW: 13.3 % (ref 11.5–15.5)
RDW: 13.5 % (ref 11.5–15.5)
RDW: 13.6 % (ref 11.5–15.5)
WBC: 10.3 10*3/uL (ref 4.0–10.5)
WBC: 10.9 10*3/uL — ABNORMAL HIGH (ref 4.0–10.5)
WBC: 7.9 10*3/uL (ref 4.0–10.5)

## 2010-12-04 LAB — BASIC METABOLIC PANEL
BUN: 10 mg/dL (ref 6–23)
BUN: 12 mg/dL (ref 6–23)
BUN: 9 mg/dL (ref 6–23)
CO2: 27 mEq/L (ref 19–32)
CO2: 29 mEq/L (ref 19–32)
CO2: 30 mEq/L (ref 19–32)
Calcium: 8 mg/dL — ABNORMAL LOW (ref 8.4–10.5)
Calcium: 8.1 mg/dL — ABNORMAL LOW (ref 8.4–10.5)
Calcium: 8.1 mg/dL — ABNORMAL LOW (ref 8.4–10.5)
Chloride: 100 mEq/L (ref 96–112)
Chloride: 103 mEq/L (ref 96–112)
Chloride: 99 mEq/L (ref 96–112)
Creatinine, Ser: 0.75 mg/dL (ref 0.4–1.2)
Creatinine, Ser: 0.78 mg/dL (ref 0.4–1.2)
Creatinine, Ser: 0.8 mg/dL (ref 0.4–1.2)
GFR calc Af Amer: >60 mL/min (ref >60)
GFR calc Af Amer: >60 mL/min (ref >60)
GFR calc Af Amer: >60 mL/min (ref >60)
GFR calc non Af Amer: >60 mL/min (ref >60)
GFR calc non Af Amer: >60 mL/min (ref >60)
GFR calc non Af Amer: >60 mL/min (ref >60)
Glucose, Bld: 117 mg/dL — ABNORMAL HIGH (ref 70–99)
Glucose, Bld: 117 mg/dL — ABNORMAL HIGH (ref 70–99)
Glucose, Bld: 140 mg/dL — ABNORMAL HIGH (ref 70–99)
Potassium: 3.6 mEq/L (ref 3.5–5.1)
Potassium: 3.6 mEq/L (ref 3.5–5.1)
Potassium: 3.8 mEq/L (ref 3.5–5.1)
Sodium: 133 mEq/L — ABNORMAL LOW (ref 135–145)
Sodium: 134 mEq/L — ABNORMAL LOW (ref 135–145)
Sodium: 136 mEq/L (ref 135–145)

## 2010-12-04 LAB — PROTIME-INR
INR: 1.08 (ref 0.00–1.49)
INR: 1.58 — ABNORMAL HIGH (ref 0.00–1.49)
INR: 2.04 — ABNORMAL HIGH (ref 0.00–1.49)
Prothrombin Time: 13.9 seconds (ref 11.6–15.2)
Prothrombin Time: 18.7 seconds — ABNORMAL HIGH (ref 11.6–15.2)
Prothrombin Time: 22.9 seconds — ABNORMAL HIGH (ref 11.6–15.2)

## 2010-12-15 LAB — URINALYSIS, ROUTINE W REFLEX MICROSCOPIC
Bilirubin Urine: NEGATIVE
Glucose, UA: NEGATIVE mg/dL
Hgb urine dipstick: NEGATIVE
Ketones, ur: NEGATIVE mg/dL
Nitrite: NEGATIVE
Protein, ur: NEGATIVE mg/dL
Specific Gravity, Urine: 1.015 (ref 1.005–1.030)
Urobilinogen, UA: 0.2 mg/dL (ref 0.0–1.0)
pH: 6 (ref 5.0–8.0)

## 2010-12-15 LAB — CBC
HCT: 42.3 % (ref 36.0–46.0)
Hemoglobin: 14.6 g/dL (ref 12.0–15.0)
MCHC: 34.5 g/dL (ref 30.0–36.0)
MCV: 95.3 fL (ref 78.0–100.0)
Platelets: 287 10*3/uL (ref 150–400)
RBC: 4.44 MIL/uL (ref 3.87–5.11)
RDW: 13 % (ref 11.5–15.5)
WBC: 7.6 10*3/uL (ref 4.0–10.5)

## 2010-12-15 LAB — COMPREHENSIVE METABOLIC PANEL
ALT: 17 U/L (ref 0–35)
AST: 27 U/L (ref 0–37)
Albumin: 4.5 g/dL (ref 3.5–5.2)
Alkaline Phosphatase: 78 U/L (ref 39–117)
BUN: 17 mg/dL (ref 6–23)
CO2: 30 mEq/L (ref 19–32)
Calcium: 9.9 mg/dL (ref 8.4–10.5)
Chloride: 100 mEq/L (ref 96–112)
Creatinine, Ser: 0.92 mg/dL (ref 0.4–1.2)
GFR calc Af Amer: >60 mL/min (ref >60)
GFR calc non Af Amer: >60 mL/min (ref >60)
Glucose, Bld: 94 mg/dL (ref 70–99)
Potassium: 4.7 mEq/L (ref 3.5–5.1)
Sodium: 139 mEq/L (ref 135–145)
Total Bilirubin: 1 mg/dL (ref 0.3–1.2)
Total Protein: 6.4 g/dL (ref 6.0–8.3)

## 2010-12-15 LAB — PROTIME-INR
INR: 0.94 (ref 0.00–1.49)
Prothrombin Time: 12.5 seconds (ref 11.6–15.2)

## 2010-12-15 LAB — APTT: aPTT: 24 seconds (ref 24–37)

## 2010-12-20 ENCOUNTER — Other Ambulatory Visit: Payer: Self-pay | Admitting: Internal Medicine

## 2010-12-20 NOTE — Telephone Encounter (Signed)
Left message for pt to call back- we have not been refilling Prilosec.  Okay to fill if pt is taking?

## 2010-12-27 ENCOUNTER — Encounter: Payer: Self-pay | Admitting: Internal Medicine

## 2010-12-27 ENCOUNTER — Ambulatory Visit (INDEPENDENT_AMBULATORY_CARE_PROVIDER_SITE_OTHER): Payer: Medicare Other | Admitting: Internal Medicine

## 2010-12-27 VITALS — BP 134/80 | HR 76 | Wt 164.6 lb

## 2010-12-27 DIAGNOSIS — R252 Cramp and spasm: Secondary | ICD-10-CM

## 2010-12-27 DIAGNOSIS — M1990 Unspecified osteoarthritis, unspecified site: Secondary | ICD-10-CM

## 2010-12-27 DIAGNOSIS — Z79899 Other long term (current) drug therapy: Secondary | ICD-10-CM

## 2010-12-27 MED ORDER — LISINOPRIL-HYDROCHLOROTHIAZIDE 20-12.5 MG PO TABS: 1.0000 | ORAL_TABLET | Freq: Every day | ORAL | Status: DC

## 2010-12-27 MED ORDER — GABAPENTIN 300 MG PO CAPS: ORAL_CAPSULE | ORAL | Status: DC

## 2010-12-27 NOTE — Assessment & Plan Note (Signed)
Likes [place] to refer NSAIDs (instead of ortho) -------> ok

## 2010-12-27 NOTE — Assessment & Plan Note (Signed)
2 years history of leg cramps. No history of renal failure, hypothyroidism. She has tried magnesium without relief. Plan: Labs Trial of quinine, see instructions If that fails she has a prescription for gabapentin.

## 2010-12-27 NOTE — Patient Instructions (Signed)
Try over-the-counter quinine according to the box instructions at night. If that doesn't help your cramps, try gabapentin, see prescription. Let me know if not better

## 2010-12-27 NOTE — Progress Notes (Signed)
  Subjective:    Patient ID: [name], female    DOB: [birth year], 70 y.o.   MRN: [id number]  HPI  2 years history of leg cramps at night, symptoms are bilateral, discomfort is described as a cramp , not burning or numbness. Some relief when she stretched her legs and walks. She takes lorazepam from time to time to help her sleep, has not noted if that helps with the cramps.  Tried tonic water without much relief  Past Medical History  Diagnosis Date  . Hypertension 09/11/2005  . History of elevated homocysteine   . Anxiety   . Thyroid nodule     rt benign colloid nodule--left hyperplastic nodule- Thyroid bx 2006: neg  . OA (osteoarthritis)     shoulder pain, knee pain after TKR, back pain (Dr Ethelene Hal)  . Baker's cyst   . Goiter   . Adenomatous colon polyp 03/2003   Past Surgical History  Procedure Date  . Abdominal hysterectomy   . Oophorectomy   . Knee arthroscopy     L   replacement 2007, Dr Earlie Lou, then a manipulation, then redo TKR 11/09 w/ Dr.Alusio. Parcial replacement again 09-2010  . Total hip arthroplasty 3/11    R  . Facelift   . Incisional hernia repair   . Knee surgery     x 5  . Shoulder surgery 06/2009   Social History: Reviewed history from 03/15/2010 and no changes required. 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 -Stopped 1985 Alcohol Use - yes-2 glasses daily Daily Caffeine Use-1 cup daily Illicit Drug Use - no Patient walks 2 miles  daily  diet-- healthy    Review of Systems No fevers No lower extremity or rash Past chronic back pain, pain better but not gone since she had a shot a few weeks ago.    Objective:   Physical Exam  Constitutional: She is oriented to person, place, and time.  Cardiovascular: Normal rate, regular rhythm and normal heart sounds.   Musculoskeletal:       Lower extremities without edema, calves are symmetric, range of motion of the ankles normal. Normal bilateral pedal pulses    Neurological: She is alert and oriented to person, place, and time.  Psychiatric: She has a normal mood and affect. Her behavior is normal. Thought content normal.          Assessment & Plan:

## 2010-12-28 LAB — VITAMIN B12: Vitamin B-12: 252 pg/mL (ref 211–911)

## 2010-12-28 LAB — VITAMIN D 25 HYDROXY (VIT D DEFICIENCY, FRACTURES): Vit D, 25-Hydroxy: 54 ng/mL (ref 30–89)

## 2010-12-28 LAB — FOLATE: Folate: 10.8 ng/mL (ref >5.9)

## 2011-01-19 ENCOUNTER — Encounter: Payer: Self-pay | Admitting: Internal Medicine

## 2011-01-19 ENCOUNTER — Ambulatory Visit (INDEPENDENT_AMBULATORY_CARE_PROVIDER_SITE_OTHER): Payer: Medicare Other | Admitting: Internal Medicine

## 2011-01-19 DIAGNOSIS — L259 Unspecified contact dermatitis, unspecified cause: Secondary | ICD-10-CM

## 2011-01-19 DIAGNOSIS — L309 Dermatitis, unspecified: Secondary | ICD-10-CM | POA: Insufficient documentation

## 2011-01-19 MED ORDER — HYDROCORTISONE 2.5 % EX CREA: TOPICAL_CREAM | Freq: Two times a day (BID) | CUTANEOUS | Status: DC

## 2011-01-19 NOTE — Patient Instructions (Signed)
epson salts Creams twice a day Call if redness or discharge or worsening symptoms

## 2011-01-19 NOTE — Progress Notes (Signed)
  Subjective:    Patient ID: [name], female    DOB: 27-Dec-1940, 70 y.o.   MRN: [id number]  HPI 10 days ago developed a 1 cm clear fluid blister at the left great toe. She  self lancet and is better. Then she developed a similar but smaller blister on the second left toe. Yesterday she developed another @ second right toe.  Past Medical History  Diagnosis Date  . Hypertension 09/11/2005  . History of elevated homocysteine   . Anxiety   . Thyroid nodule     rt benign colloid nodule--left hyperplastic nodule- Thyroid bx 2006: neg  . OA (osteoarthritis)     shoulder pain, knee pain after TKR, back pain (Dr Ethelene Hal)  . Baker's cyst   . Goiter   . Adenomatous colon polyp 03/2003   Past Surgical History  Procedure Date  . Abdominal hysterectomy   . Oophorectomy   . Knee arthroscopy     L   replacement 2007, Dr Earlie Lou, then a manipulation, then redo TKR 11/09 w/ Dr.Alusio. Parcial replacement again 09-2010  . Total hip arthroplasty 3/11    R  . Facelift   . Incisional hernia repair   . Knee surgery     x 5  . Shoulder surgery 06/2009    Review of Systems Denies itching, mild pain. Not using any new shoes in fact she has been using flip-flops lately. Denies the use of a new nail polish and  creams. No other skin problems anywhere else.    Objective:   Physical Exam Alert oriented in no apparent distress. At day left great toe there is mild redness from a resolving blister. Similar findings at the second left toe. Very mild redness at the second right toe. No evidence of cellulitis. Skin between the toes is normal.        Assessment & Plan:

## 2011-01-19 NOTE — Assessment & Plan Note (Signed)
Blisters in the toes without obvious infection (bacterial or fungal). Conservative treatment with Epsom salts soaks, steroids. See instructions.

## 2011-01-24 NOTE — Discharge Summary (Signed)
[name], [name]                ACCOUNT NO.:  [id number]   MEDICAL RECORD NO.:  [id number]          PATIENT TYPE:  INP   LOCATION:  1613                         FACILITY:  [facility]   PHYSICIAN:  Ollen Gross, M.D.    DATE OF BIRTH:  02/19/1941   DATE OF ADMISSION:  08/17/2008  DATE OF DISCHARGE:  08/20/2008                               DISCHARGE SUMMARY   ADMITTING DIAGNOSES:  1. Unstable left total knee arthroplasty.  2. Hypertension.  3. Elevated homocysteine levels.  4. Anxiety.  5. Thyroid goiter/nodule.  6. History of colonic polyps.  7. History of gastritis.  8. Varicose veins.   DISCHARGE DIAGNOSES:  1. Unstable left total knee arthroplasty status post left total knee      arthroplasty revision.  2. Hypertension.  3. Elevated homocysteine levels.  4. Anxiety.  5. Thyroid goiter/nodule.  6. History of colonic polyps.  7. History of gastritis.  8. Varicose veins.  9. Postop blood loss anemia, did not require transfusion.   PROCEDURE:  August 17, 2008, left total knee arthroplasty revision.  Surgeon was Dr. Lequita Halt, assistant was Avel Peace PA-C.  Anesthesia  general with postop Marcaine pain pump added.   CONSULTS:  None.   BRIEF HISTORY:  [name] is a 70 year old female with a left total knee  arthroplasty done several years ago complicated by stiffness requiring  manipulation then requiring arthroscopy.  She was noted to have more  stiffness and now some stability.  Earlier this year she felt a pop and  the knee became basically unstable and now presents for revision  arthroplasty.   LABORATORY DATA:  Preop CBC showed hemoglobin of 14.9, hematocrit of  45.2, white cell count 9.4, platelets 302.  Chem panel on admission all  within normal limits.  PT/INR 12.8, 1.0 with a PTT of 27.  Preop UA was  negative.  Serial CBCs were followed throughout the hospital course.  Hemoglobin went down 10.5 postop and then 10.  Last noted H and H  stabilized at 9.9 with a  crit of 29.5.  Serial pro-times followed per  Coumadin protocol.  Last noted PT/INR were 22.2 and 1.8.  Serial BMETs  were followed for 2 days.  Electrolytes remained all within normal  limits.  Chest x-ray two view chest August 14, 2008, no active disease.  EKG dated July 14, 2008, sinus rhythm, possible anterior septal  infarct age undetermined.   HOSPITAL COURSE:  The patient was admitted to [facility] and  tolerated the procedure well, later transferred to the recovery room and  then orthopedic floor, given 24 hours postop IV antibiotics.  Started on  Coumadin for DVT prophylaxis.  She did pretty well on the evening of  surgery especially having undergone a big revision.  Blood pressure was  a little low on the morning of day 1 so we held her lisinopril and put  metoprolol on parameters.  Hemovac drain placed at time of surgery was  pulled on day 1.  Started getting up out of bed and had excellent  urinary output despite having lower pressures.  Pressures did come up.  She was by herself so we got social work involved.  She had looked into  several facilities preoperatively and wanted to look into the  possibility of going over to [facility] assisted living.  She had  already spoken with [facility] and also spoken with Genevieve Norlander to have  them do her therapy while she was there.  Discharge planning got  involved to help make arrangements.  By day 2 the patient was doing very  well.  Hemoglobin was stable.  She had excellent urinary output and  pressure was stable.  Continued to progress well, was walking about 100  feet.  Dressing changed and incision looked good.  She was seen on the  morning and by day 3 the bed was available at [facility].  She was  doing well from a postoperative period.  She had been weaned off of her  PCA over to p.o. meds on day 2 and tolerating her meds by day 3 and  discharged at that time.   DISCHARGE PLAN:  1. The patient was  discharged to [facility] on August 20, 2008.  2. Discharge diagnoses - please see above.  3. Discharge meds - Percocet for pain, Robaxin for spasms, Coumadin      for DVT prophylaxis.   FOLLOWUP:  She needs to follow up on Thursday, August 27, 2008.  Please contact the office at [phone] to help make arrangements and  followup appointment for this patient.   ACTIVITIES:  She can be weightbearing as tolerated to the left lower  knee total knee protocol, [facility] home [facility] will be come out and  assisting with her therapy, physical therapy for gait training,  ambulation and OT may be ordered if needed.  Please take that as an  order.  If she requires OT for ADLs please arrange this with [facility]  home care, otherwise continue range of motion/strengthening exercises.  She may start showering, however, do not submerge the incision under  water.   DISPOSITION:  [facility].   DISCHARGE CONDITION:  Improved.      Alexzandrew L. Perkins, P.A.C.      Ollen Gross, M.D.  Electronically Signed    ALP/MEDQ  D:  08/20/2008  T:  08/20/2008  Job:  [id number]   cc:   Ollen Gross, M.D.  Fax: [phone]   [name], MD  [phone] [place], [place] [address]   [facility]

## 2011-01-24 NOTE — H&P (Signed)
[name], [name]                ACCOUNT NO.:  [id number]   MEDICAL RECORD NO.:  [id number]          PATIENT TYPE:  INP   LOCATION:                               [name]   PHYSICIAN:  [name], M.D.    DATE OF BIRTH:  Jul 14, 1941   DATE OF ADMISSION:  08/17/2008  DATE OF DISCHARGE:                              HISTORY & PHYSICAL   CHIEF COMPLAINT:  Left knee pain.   HISTORY OF PRESENT ILLNESS:  The patient is a 70 year old female who has  seen by Dr. Lequita [name] for ongoing left knee pain.  She has had a previous  left total knee done and has developed pain.  She has undergone  aspirations due to recurring effusions.  It is felt that she has some  instability now with her knee replacement.  She had a previous knee with  a closed manipulation but now it has gone on to loosen up.  It was felt  she would benefit undergoing revision.  She was subsequently admitted.   ALLERGIES:  No known drug allergies.   CURRENT MEDICATIONS:  1. Lisinopril.  2. Protonix.  3. Zyrtec.  4. Ativan.  5. Extra strength Tylenol.  6. Aspirin.  7. Recently added metoprolol 25 twice a day.  8. Multivitamin.  9. Calcium plus D.  10.Celebrex.   PAST MEDICAL HISTORY:  Hypertension, elevated homocysteine levels,  anxiety, thyroid goiter/nodule, history of colonic polyps, history of  gastritis, varicose veins.   PAST SURGICAL HISTORY:  Hysterectomy and hernia repair, right knee  surgery and left knee surgery.   FAMILY HISTORY:  Father with history of prostate cancer and bypass  surgery.  Mother with rheumatoid arthritis.   SOCIAL HISTORY:  Married, past smoker, half pack a day per day for about  25 years, two glasses of wine daily.  She does not have anyone to care  for her postoperatively.  She wants to look into a rehab facility.  She  would like to look into the [name]  [facility].   REVIEW OF SYSTEMS:  GENERAL:  No fevers, chills, night sweats.  NEUROLOGICAL:  No seizures, syncope or paralysis.  RESPIRATORY:  No  shortness of shortness, productive cough or hemoptysis.  CARDIOVASCULAR:  No chest pain, angina, orthopnea.  GI:  No nausea, vomiting, diarrhea,  constipation.  GU:  No dysuria, hematuria or discharge.  MUSCULOSKELETAL:  Left knee.   PHYSICAL EXAMINATION:  VITAL SIGNS:  Pulse 68, respiration 12, blood  pressure 136/78.  GENERAL:  A 67-year white female well-nourished, well-developed, in no  acute distress, slightly overweight, alert and oriented and cooperative,  pleasant.  Good historian.  HEENT:  Normocephalic, atraumatic.  Pupils are round and reactive.  Oropharynx clear.  EOMs intact.  NECK:  Supple.  CHEST:  Clear.  HEART:  She has a faint early systolic ejection murmur noted.  S1-S2.  ABDOMEN:  Soft, round.  Bowel sounds present.  RECTAL/BREASTS/GENITALIA:  Not done, not pertinent to present illness.  EXTREMITIES:  Left knee moderate swelling.  Range of motion 15-95, a  little bit of  AP instability.  No varus valgus instability.   IMPRESSION:  Unstable left knee.   PLAN:  The patient will be admitted to [name] to undergo  revision left total knee arthroplasty.  Surgery will be performed by Dr.  Ollen [name].      [name], P.A.C.      [name], M.D.  Electronically Signed    ALP/MEDQ  D:  08/16/2008  T:  08/16/2008  Job:  [id number]   cc:   Laurie Ora, MD  [phone] [address] [place], [place] [address]

## 2011-01-24 NOTE — Op Note (Signed)
[name], [name]                ACCOUNT NO.:  [id number]   MEDICAL RECORD NO.:  [id number]          PATIENT TYPE:  INP   LOCATION:  0010                         FACILITY:  [facility]   PHYSICIAN:  Ollen Gross, M.D.    DATE OF BIRTH:  1941/04/17   DATE OF PROCEDURE:  08/17/2008  DATE OF DISCHARGE:                               OPERATIVE REPORT   PREOPERATIVE DIAGNOSIS:  Unstable left total knee arthroplasty.   POSTOPERATIVE DIAGNOSIS:  Unstable left total knee arthroplasty.   PROCEDURE:  Left total knee arthroplasty revision.   ASSISTANT:  Alexzandrew L. Perkins, P.A.C.   ANESTHESIA:  General with postoperative Marcaine pain pump.   ESTIMATED BLOOD LOSS:  Minimal.   DRAIN:  Hemovac x1.   TOURNIQUET TIME:  23 minutes at 300 mmHg.   COMPLICATIONS:  None.   CONDITION:  Stable to recovery.   BRIEF CLINICAL NOTE:  Ms. Grode is a 70 year old female who has had a  left total knee arthroplasty done several years ago, complicated with  stiffness, requiring manipulation and then requiring arthroscopy.  I  have seen her for the past year or so and we did note she had some  stiffness but more of her problem was painful instability.  She had an  episode earlier this fall where she felt a pop and then the knee became  grossly unstable.  She presents now for revision total knee  arthroplasty.   PROCEDURE IN DETAIL:  After successful administration of general  anesthetic, a tourniquet is placed high on her left thigh and left lower  extremity prepped and draped in the usual sterile fashion.  The  extremity was wrapped in an Esmarch, the knee flexed, and tourniquet  inflated to 300 mmHg.  A midline incision was made with #10 blade  through subcutaneous tissue to the level of the extensor mechanism.  A  fresh blade is used to make a medial parapatellar arthrotomy.  The soft  tissue over the proximal medial tibia subperiosteally elevated to the  joint line with a knife.  We encountered  some minimal amount of clear  fluid upon entering the joint that was sent for stat Gram stain which  was negative.  I did remove a large amount of scar tissue from under the  extensor mechanism both medially and laterally.  I was able to sublux  the patella and then flex the knee to 90 degrees.  The knee was very  unstable at 90 degrees of flexion.  In extension, there was a tiny bit  of play.  This was something that was not going to be corrected simply  by going to a thicker polyethylene because we would then sacrifice the  stability in flexion for lack of extension and we would have had a  flexion contracture.  It was thus necessary to revise the knee in order  to get a flexion/extension balance.   I removed the tibial polyethylene.  We then disrupted the interface  between the femoral component and bone utilizing an osteotome.  The  femoral component was removed easily without any bone loss.  We  then  subluxed the tibia forward and on the tibial side disrupted the  interface between the tibial component and bone with an oscillating saw  including the tibial component without any bone loss.  The cement was  then removed from the tibial canal.  I used a drill to create starting  holes in both canals and thoroughly irrigated both the tibial and  femoral canal with saline solution.  On the femoral side, we reamed up  to 18 mm for a good Press-Fit on the tibial side up to 13 mm for  cemented stem.   Tibia is again subluxed forward and retractors placed.  The  extramedullary tibial alignment guides were placed referencing  proximally at the medial aspect of tibial tubercle and distally along  the second metatarsal and the tibial crest.  We took about a millimeter  off the cut tibial surface.  We then placed the size 3 MPT revision  trial tray and then drilled through that for preparation of the modular  portion proximally.  The keel punch was then placed.  I then placed the  trial size 3  MPT revision tray with a 13 x 30 stem extension.  There was  not very good torsion or stability, so I then prepped for and placed a  29-mm sleeve which controlled the rotational stability when we placed  the trial.  The trials were removed.   The femur was prepared by placing the 18-mm reamer to serve as an  intramedullary guide.  The 5-degree left valgus alignment guide is  placed to remove about a millimeter off the medial and lateral surface  distally.  Size 3 is the most appropriate femoral component.  The size 3  cutting block is then placed in +2 position.  A 10-mm spacer block is  placed at 90 degrees of flexion to create a rectangular flexion gap.  This marked the rotation for the cutting block.  The cutting block is  placed in the +2 position to effectively raise the stem and lower the  component down to the anterior cortex of the femur.  This is placed in  position and then we did get a tiny bit of bone posteriorly.  In order  to improve the flexion stability, I decided to do a 4-mm posterior  augment both medial and lateral.  The femoral preparation is completed  with the revision block.  The intercondylar cut is made in the TC-3  position.   The trial size 3 MPT revision tray with the 13 x 30 stem extension and  29-mm sleeve id placed.  The femoral side with a size 3 TC3 femur with a  18 x 75 stem extension and a +2 position with a 4-mm medial and lateral  posterior augments.  There was good fit on both bone surfaces.  I got up  to a 17.5 insert for full extension with excellent varus/valgus anterior  and posterior balance throughout full range of motion.  I was very  pleased with this and the patella tracked normally.  Please note that we  had let the tourniquet down after the time of 23 minutes because we had  a venous tourniquet which was not functioning well.  There was minimal  bleeding encountered through the case.   We then thoroughly irrigated the cut bone surface  with pulsatile lavage  while preparing the components on the back table.  Once the component  was prepared, then I placed the size 4 cement restricter in  the tibial  canal at the appropriate depth.  The cement is mixed and once ready for  implantation is injected into the tibial canal and the tibial components  impacted and cement removed.  On the femoral side, we cemented just  distally and had a Press-Fit stem.  Both components were impacted and a  trial 17.5 insert was placed and the knee held in full extension, all  extruded cement removed.  The patellar component was left intact.  I did  a patellaplasty to remove excess tissue and bone from around the  patella.  Once the cement fully hardened, then we placed the permanent  17.5 mm TC3 rotating platform insert into the tibial tray.  The wound  was copiously irrigated with saline solution and then the arthrotomy  closed over a Hemovac drain with interrupted 1-PDS.  The patella tracks  normally.  We had flexion against gravity  to 135 degrees.  The subcu was closed with interrupted 2-0 Vicryl and  the skin with staples.  The catheter for the Marcaine pain pump is  placed and the pump is initiated.  The drain is hooked to suction.  A  bulky sterile dressing is applied.  She is awakened and transported to  recovery in stable condition.      Ollen Gross, M.D.  Electronically Signed     FA/MEDQ  D:  08/17/2008  T:  08/17/2008  Job:  [id number]

## 2011-01-27 NOTE — Op Note (Signed)
[name], [name]                ACCOUNT NO.:  [id number]   MEDICAL RECORD NO.:  [id number]          PATIENT TYPE:  AMB   LOCATION:  DAY                          FACILITY:  [facility]   PHYSICIAN:  Kerrin Champagne, M.D.   DATE OF BIRTH:  December 24, 1940   DATE OF PROCEDURE:  02/01/2005  DATE OF DISCHARGE:                                 OPERATIVE REPORT   PREOPERATIVE DIAGNOSIS:  Right knee medial popliteal cyst, patellofemoral  osteoarthritis changes, internal derangement of the right knee.   POSTOPERATIVE DIAGNOSIS:  Right knee popliteal cyst just lateral to the  medial gastroc medius tendon with chondromalacia patella,  grade 3 and 4  changes involving the patellofemoral joint and chondromalacia changes  involving the lateral aspect of medial femoral condyle.   PROCEDURE:  Right knee arthroscopy, chondroplastic shaving of the medial  femoral condyle and patellofemoral joint. Excision of medial shelf plica.  Excision of popliteal cyst.   SURGEON:  Dr. Vira Browns.   ASSISTANT:  None.   ANESTHESIA:  General via oral tracheal intubation. Also local infiltration  over the anterior knee portal sites and posterior knee utilizing  some 15 mL  of Marcaine 0.5% with 1:200,000 epinephrine. [name], M.D. was the  anesthesiologist.   SPECIMEN:  Popliteal cyst sent for pathologic evaluation.   ESTIMATED BLOOD LOSS:  10 mL.   COMPLICATIONS:  None. The patient returned to the PACU in good condition.   HISTORY OF PRESENT ILLNESS:  A 70 year old female who presented to my clinic  earlier this year with severe posterior knee pain and right calf pain. She  underwent evaluation MRI study and clinically felt to have popliteal cyst on  MRI scan demonstrating tracking inferiorly with concern about the  possibility of posterior compartment syndrome. The patient underwent intra-  articular injection treatment with anti-inflammatory agents as well as  aspirin to prevent DVT. The patient eventually  had improvement in her pain  pattern. The popliteal cyst was aspirated. Due to the nature of this cyst  and severe pain and the feeling that is perhaps causing problems of  recurring compartment syndrome, the patient underwent further studies  including the MRI study which demonstrated the popliteal cyst tracking into  the medial gastroc region. Intra-articular evaluation showed patellofemoral  changes but no other significant abnormalities. After further treatment, it  was felt that resection of the popliteal cyst would prevent further  recurrent episodes of severe pain in this patient's calf and posterior knee.  She is brought to the operating room to undergo arthroscopic evaluation and  resection of the medial popliteal cyst.   INTRAOPERATIVE FINDINGS:  The patient was found to have a popliteal cyst  that was extending from the lateral aspect to the medial gastrocnemius head  between this layer and the hamstrings. It did appear to communicate well  with the patient's posterior knee joint. Following the resection, the base  of the cyst was imbricated and then sutured closed with a #0 Vicryl suture.   DESCRIPTION OF PROCEDURE:  After adequate general anesthesia, the patient  with adequate oral intubation, standard preoperative antibiotics,  standard  prep of the right lower extremity from the ankle to the upper thigh,  tourniquet about the upper thigh which was not used during the arthroscopy,  draped in the usual manner following a prep with DuraPrep. The patient  underwent arthroscopic evaluation through both anterior inferomedial and  anterior inferolateral arthroscopic portals. These were infiltrated with  Marcaine 0.5%, 1:200,000 epinephrine, 5 mL each. An initial incision made  into the lateral joint line anteriorly. Using 11 blade scalpel, this was  then dilated using a blunt probe and the arthroscopic sleeve inserted and  the arthroscope then used to evaluate the knee infiltrating  the knee with  irrigant solution and 80 mmHg. The knee joint carefully evaluated including  the medial joint line and micrographs obtained demonstrating the patient's  anatomy. The medial joint line demonstrated irregularity of the lateral  aspect of the medial femoral condyle,  grade 3-4 changes of chondromalacia.  Arthroscopic evaluation of the meniscus demonstrated no significant  pathology involving the medial meniscus, either its posterior horn, the  medial rim or anterior rim. Anterior posterior cruciate appeared to be in  good condition and the lateral joint line was evaluated and this  demonstrated no significant abnormalities involving the posterior rim,  lateral rim or anterior rim. There was, however, some debris within the  posterior attachment of the lateral meniscus. The medial corner which was  then placed using the spinal needle and making a stab incision with an 11  blade scalpel and dilating with a blunt trocar probe was inserted and this  area was probed felt to represent debris in the region of the posterior  attachment of the lateral meniscus consistent with some torn cartilage here.  This was carefully debrided using a 3.5 full radius shaver, the medial  femoral condyle  lateral aspect was also shaved appropriately down to a more  smooth surface in appearance. Attention then turned to evaluation of the  patellofemoral joint, the trochlear groove felt to show significant changes  of grade 3 and 4 chondromalacia with friability of the cartilage surface, so  this was carefully smoothed and debrided using the 3.5 full radius shaver.  Evaluating the medial plica,  there was a large plica that did appear to  impinge on the medial femoral condyle and this was divided and resected  using the full radius shaver. Irrigation was performed of the joint and the  arthroscopic instruments removed following the removal of any irrigant solution from within the knee joint. Each of the  portals were then closed  with subcu sutures of 3-0 Vicryl and skin sealant was then used to carefully  approximate the skin edges and to seal the edges. The patient was then  dressed with dressing of 4x4's and Kerlix. She was then rolled to a prone  position with chest rolls, all pressure points well-padded, arms at the  side. The tourniquet in place, the right leg was then prepped from the ankle  to the upper thigh with DuraPrep solution a second time and then draped in  the usual manner in a prone position. The right leg was then elevated and  exsanguinated with Esmarch bandage. Tourniquet inflated to 275 mmHg. A  S  shaped incision was made along the medial aspect of the patient's posterior  popliteal region, the distal curve along the medial aspect of the medial  gastroc distally, the more horizontal portion of the curve at the level the  joint line and then extended along the lateral aspect of  the medial  hamstring border. The incision through skin and subcutaneous layers to the  superficial fascia. This was incised, deep fascial layer identified and  incised. The popliteal cyst noted to be distended and running along the  medial aspect of the aspect of the hamstring muscles and lateral to the  medial gastroc muscle. The distal portions of the popliteal cyst were  evaluated and found to be present running along the medial gastroc and  within the posterior compartment. This was carefully freed up and then  carefully dissected off the medial gastroc muscle along the lateral aspect  of the medial gastroc and its medial gastroc tendon. The superior aspect of  the cyst was also further freed up and taken off of the hamstring tendons  were it showed some fibrous attachments. Apparently she had ruptured  previously and this had developed some dense scar tissue and that was left  in place here.   The cyst was then taken back to the level the joint line just lateral to the  medial gastroc  region. Here the base of the cyst was then carefully clamped  using a hemostat and divided and the cyst removed and delivered for surgical  specimen. A #0 Vicryl suture was then passed along the medial and lateral  aspects of the clamped base of the cyst and the clamp was then used to  invaginate the base of the cyst and this was then closed as a pursestring  suture. This invaginated the base of the cyst and closed the base of the  cyst at the joint level. Irrigation was then performed. The soft tissues  appeared to fall back into the space easily. The superficial fascial layer  was then approximated with interrupted #0 Vicryl sutures. The subcu layers  approximated with interrupted 2-0 Vicryl suture and the skin closed with a  running subcu stitch of 4-0 Vicryl. Note that loupe magnification was used  for the popliteal cyst excision. Photomicrographs were obtained during the arthroscopic procedure for permanent documentation. Tincture of Benzoin and  Steri-Strips was applied to the popliteal cyst incision site following its  closure. 4x4's and ABD pad fixed to this area and 4x4's to the anterior knee  with Kerlix and an Ace wrap was then applied. A 6-inch Ace wrap for the mid  calf to the upper thigh level. Ice packs were then placed. The patient  returned to a supine position, reactivated and extubated, returned to the  recovery room in satisfactory condition. All instrument and sponge counts  were correct.       JEN/MEDQ  D:  02/01/2005  T:  02/01/2005  Job:  [id number]

## 2011-01-27 NOTE — Op Note (Signed)
[name], [name]                ACCOUNT NO.:  [id number]   MEDICAL RECORD NO.:  [id number]          PATIENT TYPE:  OIB   LOCATION:  1610                         FACILITY:  [facility]   PHYSICIAN:  Kerrin Champagne, M.D.   DATE OF BIRTH:  [birth year]   DATE OF PROCEDURE:  09/25/2006  DATE OF DISCHARGE:                               OPERATIVE REPORT   PREOPERATIVE DIAGNOSIS:  Arthrofibrosis left total knee arthroplasty  with diminished range of motion.   POSTOPERATIVE DIAGNOSIS:  1. Arthrofibrosis left total knee arthroplasty with diminished range      of motion.  2. Capsular contraction involving the suprapatellar pouch as well as      both medial and lateral anterior fat pad.   PROCEDURE:  Arthroscopic debridement left total knee arthroplasty with  lateral retinacular release with closed manipulation left total knee  arthroplasty.   SURGEON:  Dr. Vira Browns   ASSISTANT:  Maud Deed, [facility]   ANESTHESIA:  General via oral obturator, Dr. Council Mechanic, supplemented with  infiltration of portals and intra-articular injection with Marcaine 0.5%  with 1:200,000 epinephrine.  Total tourniquet time at 300 mmHg 77  minutes.Marland Kitchen   BRIEF CLINICAL HISTORY:  The patient is a 70 year old female who has  undergone previous arthroscopic surgery on left knee, demonstrating  tricompartmental osteoarthritis changes, previous popliteal cyst.  She  had been treated conservatively for a year and half before undergoing a  total knee arthroplasty because of persistent pain and discomfort  necessitating stronger medicines and anti-inflammatory agents.  She  underwent total knee arthroplasty this August 2007, had difficulties  postoperatively with significant discomfort and pain, difficulty  regaining range of motion presently with range of motion of 0 degrees to  approximately 90-95 degrees.  She has quite a bit of discomfort at the  fullest extent of flexion, tightness over the anterior aspect of the  knee  consistent with arthrofibrosis.  She is brought to the operating  room to undergo a left knee manipulation with arthroscopic debridement  of the notch and evaluation for release.   INTRAOPERATIVE FINDINGS:  The patient was found to have contracted  capsule involving the suprapatellar pouch as well as over the anterior  medial and anterior lateral retinaculum of the knee.  She underwent  debridement of the synovial lining anteriorly over the superior patella  pouch region as well as over the areas of the anterior lateral, anterior  medial capsule, debriding back to fatty layers in order to allow for  release of the capsular lining of the knee.  With this, we were able to  obtain about 120 degrees of flexion passively while the knee was in both  a knee holder and tourniquet at 300 mmHg.   DESCRIPTION OF PROCEDURE:  After adequate general anesthesia, left lower  extremity prepped from ankle to upper thigh tourniquet; leg holder was  used.  A standard prep as noted with DuraPrep, draped in the usual  manner.   The knee inflated with 60 mL of irrigant solution.  A stab incision made  into the anterior inferior lateral portal region using a 15  blade  scalpel after first infiltrating Marcaine 0.5% with 1:200,000  epinephrine.  The sleeve for the arthroscope was then inserted into the  knee joint and the arthroscope.  Evaluation of the knee demonstrated  quite a bit of adhesions over the patient's notch anteriorly and both  anterior medial and anterior lateral fibrosis.  A gator 3.5 shaver was  introduced through an anterior inferior medial portal after making a  stab incision with an 11 blade scalpel, infiltration with Marcaine 0.5%  with 1:200,000 epinephrine.  The shaver then placed, used to shave the  scar tissue over the anterior aspect of the total knee arthroplasty and  through the patella in the region of the notch, debriding any of the  residual ligament mucosa, any residual  retropatellar fat pad adhesions  here.  Additionally, debridement was carried out over the anterior  aspect of both medial and lateral condyles of femoral component,  debriding away fibrous scar tissue that was present here.  Evaluation of  the suprapatellar pouch demonstrated was contracted.  The pouch was then  released using a combination of the radiofrequency cautery wand.  This  was done, releasing the capsule where possible.  And then a combination  of curved scissors, upbiting baskets, and the 30-degree angled wizard  shaver were used to debride the capsule in the suprapatellar pouch  region.  Quite a bit of fibrosis was noted along the lateral retinaculum  region, and the lateral retinaculum was then released using again a  combination of scissors, the radiofrequency cautery device as well as  the wizard debriding to release of the lateral retinaculum.  Following  this then, the patella was freely mobile, and the knee could be flexed  to 120 degrees.   Microscopic radiographs were obtained during the procedure demonstrating  release of the suprapatellar pouch as well as the anterior aspect of the  knee joint.   After further irrigation, then inspection demonstrated no further  release necessary.  The knee was then carefully deflated of any residual  irrigant solution.  Both medial and lateral portal sites were then  closed with subcu sutures of 3-0 Vicryl then 3-0 nylon sutures in a  vertical mattress fashion.  Note, the tourniquet was inflated at the  beginning of the case during debridement and was released at the end of  the case at 300 mmHg for 77 minutes.  Adaptic, 4x4s, ABD pad fixed to  the skin with Kerlix and Ace wrap applied.  The patient had radiographs  obtained.  These demonstrated the patella and the femoral components and  tibial components in good position and alignment.  The patient was then reactivated, extubated, returned to the recovery room in satisfactory   condition.  All instrument and sponge counts were correct.      Kerrin Champagne, M.D.  Electronically Signed     JEN/MEDQ  D:  09/25/2006  T:  09/25/2006  Job:  [id number]

## 2011-01-27 NOTE — Op Note (Signed)
[name], [name]                ACCOUNT NO.:  [id number]   MEDICAL RECORD NO.:  [id number]          PATIENT TYPE:  INP   LOCATION:  5021                         FACILITY:  [facility]   PHYSICIAN:  Kerrin Champagne, M.D.   DATE OF BIRTH:  August 03, 1941   DATE OF PROCEDURE:  05/01/2006  DATE OF DISCHARGE:                                 OPERATIVE REPORT   PREOPERATIVE DIAGNOSIS:  Left knee severe tricompartmental osteoarthritis.   POSTOPERATIVE DIAGNOSIS:  Left knee severe tricompartmental osteoarthritis.   PROCEDURE:  Left computer-assisted PFC DePuy cemented total knee  arthroplasty using  #3 tibial and femoral components and a 10-mm insert with  32 mm patella component.  The patient also underwent intra-articular  injection of the right knee using 5 mL of Marcaine and 1 mL of 40 mg of Depo-  Medrol per mL.   SURGEON:  Kerrin Champagne, M.D.   ASSISTANT:  Burnard Bunting, M.D.   ANESTHESIA:  General via oral tracheal intubation, Dr. Sondra Come.   ESTIMATED BLOOD LOSS:  150 mL.   DRAINS:  Hemovac x1, Foley to straight drain.   TOTAL TOURNIQUET TIME:  At 300 mmHg 2 hours and 5 minutes.   BRIEF CLINICAL HISTORY:  This patient is a 70 year old female who has  undergone previous bilateral knee arthroscopies right knee with a partial  medial meniscectomy; and AN excision of a popliteal cyst; and has done well.  The left knee was found to have severe tricompartmental osteoarthritis  changes with no significant meniscal tears.  She underwent debridement, but  persisted with severe knee pain, pain with ambulation, initiation of  ambulation, and starting pain.  After attempts at conservative management  including the use of steroid medicines, intra-articularly, as well as anti-  inflammatory agents by mouth, and a course of viscoelastic supplements and  therapy; the patient was has persistent pain.  She is now experiencing night  discomfort requiring continued medicines throughout the evening.  She  is  brought to the operating room to undergo left total knee arthroplasty.  She  notes that preoperatively, over the last several weeks, her right knee pain  seems to have been worsening.  She requests an intra-articular injection of  the right knee while under general anesthetic.   DESCRIPTION OF PROCEDURE:  After adequate general anesthesia with a bump  under her left buttock, and a tourniquet about the left upper thigh, and  left lateral post as the upper thigh and a foot holder were placed on the  bed for the procedure.  The brain lab equipment and computer were brought  into the field with the infrared sensing devices.  Carefully this was  positioned on the right side of the table, the opposite of the knee being  surgerized.  She underwent a standard prep with DuraPrep solution from the  left ankle to the left upper thigh; and was draped in the usual manner  identified; identified; and drape was used with standard preoperative  antibiotics of Ancef.   The left lower extremity was elevated, exsanguinated with Esmarch bandage,  and tourniquet inflated to 300  mmHg.  The incision, a standard midline  incision, slightly medial on the patella and approximately 12-14 cm of  length through skin and subcutaneous layers, extending in the line of the  quadriceps tendon proximally, about 3-4 cm proximal to the superior pole of  patella and extending along the medial aspect of the patient's anterior  tibial tubercle.  Through the skin and subcutaneous layers, the external  retinaculum of the knee was carefully incised, and developed both medial and  lateral for later approximation, exposing the deep retinaculum of knee.  Incision made into the superior medial corner of the insertion of the quad  tendon into the patella.  Preserving a cuff of tissue for reattachment then  incising along the medial border of the patella.   Next the external retinaculum knee, incising through both this as well  as  the synovial lining of the knee; and then along the medial aspect of the  patellar tendon down to bone, through the pretibial fat pad.  This was then  continued down inferior to the anterior tibial tubercle by 1-or-2 cm.   Next, the incision was carried proximally within the middle and medial third  of the quadriceps tendon.  This was continued enough proximally to allow for  eversion of the patella laterally, and then flexion of the knee.  The  retropatellar fat pad was then debrided over 50% to allow for further  exposure of the anterior aspect of the proximal tibia, exposing it over both  medial and lateral; excising medial and lateral meniscus if possible,  elevating the periosteum over the medial anterior surface of the proximal  tibia using a Cobb elevator.  This was carried through the medial aspect of  the proximal tibia only minimally elevating the medial collateral ligament  and the medial structures here.  With the knee flexed then, and osteophytes  were resected from the femoral condyles both medial lateral within the  condylar notch over the medial aspect of the proximal tibia.  The anterior  cruciate ligament was incised and excised.  Posterior cruciate ligament was  then carefully incised off of the medial aspect of the posterior portion of  the posterior femoral condyle region.  The knee was able to be subluxed at  this point.  At this point, then, pins were placed were placed within the  rays.  The two pins proximally were placed over the proximal aspect of the  incision just proximal to the flare at the femoral condyles then extending  medially.  This was away from the area of the expected use of the guide for  the distal femoral cut.  The jig used for placement of the parallel Schanz  screws was used, then the screws placed bicortical.   Next, at a distance from the anterior tibial tubercle on the anterior medial aspect of the leg, then a second set of Schanz screws  were placed through  percutaneous incisions.  The rays were then placed for distal 3 proximal.  These were carefully aligned and positioned so that they were able to be  seen well on the brain lab computer apparatus above extreme flexion of the  knee as well as full extension.  With this then registration was obtained  for the femoral head and the axis of rotation of the femur.  This done  carefully rotating and circumducting the left hip.  When obtaining the  proper amount of registration, the registration was obtained from the distal  portion of the femur  just anterior to the notch.   Tibial registration was then carried out finding both the medial and lateral  malleoli point; and then finding the area of the insertion of the center of  axis to the proximal tibia just posterior to the insertion of the anterior  cruciate ligament.  Then determining points over the medial and lateral  aspects of the proximal tibia at the expected level of the cut; and over the  posterior aspect of the proximal tibia in the midline; and then a degree of  rotation in line from posterior midline to the side of the center of access  at the posterior aspect of the anterior cruciate ligament, and medial to the  anterior tibial tubercle.  Registration was then carried out over the  anterior aspect of the proximal tibia obtaining plenty of points, here, for  registration going both lateral and medial to the further sextant here.  Then the proximal tibial plateau areas, both medial lateral. were then  registered.  The deepest portion of the medial plateau and the deepest  portion of lateral plateau along with registration of points around these  areas.  This completed registration of the tibia; and then registration was  then carried out over the femur, again, localizing the distal surface of the  femur center of axis rotation.  The anterior surface of the femur, itself,  with a single point.  Then both medial and  lateral epicondyles.  Then  obtaining registration from the very distal portion of medial femoral  condyle extending posteriorly over the posterior aspect of the condyles and  then over the most distal portion of the lateral femoral, extending  posteriorly, again, mapping out these areas quite nicely.  Each of these  areas were then carefully checked using the verification process and  obtaining points.  This was done also in the tibia area, registration as  well as distal femur.  Finally the axis, anterior above, of the femur was  determined using verification of flat surface to verification instrument.  This measured nearly parallel to the expected axis of the femur.  This  completed the registration wall points necessary for determination of  computer model for the patient's left knee.  The computer model determine  the correct components to be a #3 component, the patient's axis noted to be  near neutral position to the slight valgus.  It determined the correct positions for placement of the patient's proximal  tibia cut.  The proximal tibial cut was chosen to be at about 2 mm less than  that the expected cut of 10 mm we decided to make a cut at about the 8 to  8.5 mm.  Note that the initial pins were placed, after first using the  verification guide placed within the proximal tibia cutting jig.  The knee  flexed and carefully the jig was aligned to both the flexion-extension  alignment as well as into varus-valgus alignment in a 3-dimensional fashion.  Once it was in excellent position and alignment with both planes within a  degree.  Pinning was performed first in the medial aspect of the jig; and  carefully realigning the axis ease; and the lateral aspect of the jig was  pinned into place.  This was then placed against the proximal portion of the  tibia and cross pin performed, again, checking to ensure the correct  positioning of the proximal tibial cutting jig.  The tibia  subluxed  protecting the posterior structures; and the proximal  tibial cut was  performed.  Carefully the patellar tendon was protected as well as the  medial lateral collateral ligaments using an oscillating saw, then division  was made of the proximal tibia.  Large osteotome was then used to elevate  the patient's proximal tibial plateau cut area.  The proximal tibia was then  carefully lifted away; and then resected from soft tissue posteriorly using  electrocautery.   Next soft tissue alignment was performed using a soft tissue tensioning  gauge with the knee in extension.  It appeared as though the patient had  proper amount of tension with B+ positioning of the tensioning; both caps  medial and lateral appeared equal on extension.  Flexion gaps also appeared  equal to within 0.5 mm each.  It is felt that no significant soft tissue  tension was necessary; and this corresponded to the patient's preoperative  evaluation not showing any significant degree of deformity.   With this, then, the proximal cuts were then to be performed.  The distal  femoral cutting guide was then carefully aligned using the computer-assisted  device in both the flexion-extension mode as well as varus-and-valgus; and  the medial aspect of the jig pinned to the distal femur.  Then carefully  realigning the lateral aspect the jig was carefully pinned to the distal  femur, in the correct position; and alignment was removed approximately 10  mm of a distal femoral cut.   With this positioned then carefully protecting, again, the lateral  collateral ligaments the distal portions of the femur and proximal tibia  using a  Crego as well as right-angle clamps and a Cobb elevator.  The  distal femoral cut was performed transversely.  This was done without  difficulty.  Careful inspection of the cut surface demonstrated it to be in  good position and alignment within 1/2-degree.  With this then, the distal surface of  the femur was measured for appropriate size; and, again, using  the sizing apparatus a size between a 3 and a 4 was to be chosen.  A 3 was  necessary as this would prevent too large of a prosthesis and a tendency for  stuffing the joint.  Using then the 3 alignment guide; and the appropriate  verification tool within the guide, this was carefully aligned on the distal  aspect of the femur, the medial and lateral condyle, distal holes for  alignment of the distal chamfer cut; the jig was then carefully placed.   The chamfer cutting jig was then carefully aligned into place and then  impacted into place without difficulty using the holes placed previously  using computer assistance.  With that then, chamfer cuts were placed.  The  coronal cut for the anterior surface of the distal femur was carefully  measured; and noted not to show any significant degree of cutting into the  distal femur, no tendency to notching; and tended to align quite nicely with  the anterior surface of the femur.  Oscillating saw was then used make the  anterior coronal cut.  Posterior coronal cuts were then performed carefully  protecting the structures posterior to the condyles.  Both medial lateral  using Crego's provided.  These bony areas were removed then.  Posterior  chamfer cut was then performed and then the anterior chamfer cut performed.  The jig was held in place providing clamps and these were reestablished  after initial cuts in the coronal plane and between chamfer cuts.  With  this, then,  the distal jig was removed.   The box cutting device was then carefully applied to the end of the femur;  and then carefully inspected.  This was then carefully attached to the  distal end of the femur using obliquely oriented pins as well as distal  pins.  Oscillating saw was then used to make cuts within the box cut both in  the sagittal plane anterior-to-posterior and both medial-and-lateral and  then transversely  within the box as well.  Excess bone was then debrided  using Matt Holmes rongeur and it released along with the and posterior cruciate  ligament of the posterior aspect of the box cut.  With this, then, the  distal femoral trial was carefully approximated and attached into place.  The tray for the proximal tibia was then carefully aligned along the  corrected orientation of rotation of access.  Note that during the initial  procedure registration his white sides line was also determined as well with  the normal registrations.  The tibial tray then carefully aligned and pinned  into place; and the tower for the sleeve for the proximal tibia  intermedullary reamer was placed.  This was then used to ream the proximal  tibia without difficulty.  With this completed, then, the proximal tibial  plate was left in place; and the weaned portion of the proximal cutting jig  was left in place.  Additional centralizing peg was then placed for accepting the trial tibial insert.  First a 10-mm insert was placed; and  this provided excellent extension and slight hyperextension of 1 or 2  degrees.  The kneed tended to some slight varus about 5 degrees.  Clinically  the tibial component appeared to be a slight internal rotation though with  the ability of the rotating platform to reestablish the correct internal-  external rotation.  This was allowed for the plate to provide optimum amount  of coverage of the proximal tibia; and this appeared to be excellent.   With the leg then into full extension; again, the knee is noted to be in  slight varus at 5 degrees.  With this being the case, careful assessment  determined that the patient requires some rasping of the box cut of the  femur, in order to allow for better seating of the box portion of the trial  implant.  He also required rasping of the proximal aspect of the tibia.  The  transverse cut as there were areas of bony ridge that remained over the  posterior  lateral aspect of the tibial cut as well as posterior centrally.  These were removed and appeared to be causing some shift into varus of the  tibial component.  With these removed, then we able correct down to nearly 3  degrees of neutral to slight varus.  The leg, however, itself appeared to be  in neutral with slight valgus.  With this it was felt that the leg had  correct position and alignment; and would need to be brought into full  extension and flexion over 130 degrees without any lift-off.   The patella, component determination was made and it appears that a 32 mm  patella gave the best fit.  Using the guide then set to cut, and measuring  the height of the patella and about 24.5 mm, a cut of approximately 8.5 was  to be performed with the remaining of about 15 mm patella.  The cutting  guide was then carefully used to grasp the patella  itself and hold it in  place; and then the coronal cut was made of patella.  Note, that some  remaining cartilaginous material over the lateral facette of the patella,  did tend to make the saw spring away from the lateral aspect of the patella;  and the lateral facette while cutting from medial to lateral.   This required some rasping; and then some freehand use of the saw to  carefully smooth this area.  Additional drill holes were made into the  chondral area of the patella to provide for some additional fixation of  patella component in this area.  With the cut surface then placed, a 32-mm  guide was then used to perform drill holes over the posterior aspect of the  patella using the drill guide and drill provided.  With this completed the  trial patella was placed and appeared to seat quite nicely.  The leg was  then placed through a full range of motion with full extension and flexion  over 130 degrees without tendency of patella to have subluxation or  dislocation.  No need for lateral patellar release was felt to be necessary. With this  completed then irrigation was performed of the knee joint with  removal of all the implants.  The implants were brought onto the field  including a #3 tibial and femoral stem, and a 10 mm size tibial insert.  Note that a 12.5 insert was placed previously with this without cement.  The  knee tended to be unable to fully extend by 2-degrees.  This completed,  then, irrigation was carried out using pulse irrigant and solution.  Careful  inspection of the posterior condyles of the femur demonstrates some small  osteophytes; and these were resected using 3/4-inch curved osteotomes and  Crego's preventing posterior structures from injury.  Careful irrigation was  carried out.  Areas of the expected geniculate both centrally and medial and  lateral were then carefully cauterized using bipolar electrocautery to try  to prevent any bleeding.   The knee was then brought into flexion.  The proximal aspect of the tibia  was carefully prepared drying the central keel suctioned.  Unfortunately  osteophytes of the posterior distal femur were localized; and this was done  after the cement mixed, it was only to be placed over the proximal tibia,  over the patella component initially; and we were unable to cement the femur  initially; and the second batch of cement was necessary.  Note with the  proximal tibia fixation, the cement was placed within the proximal tibial  and centralized proximal tibial intermedullary keel pinning in both medial  lateral plateau surfaces quite nicely.   Also over the posterior aspect of the implant, itself, the implant was then  carefully placed in the correct degree of rotation and packed it into place.  Cement was in excess, but posterior medial and lateral was removed using  Personal assistant provided.  With this completed, then the patella component  was attached into place, again, using cement into each of the Peg holes,  then pressing the patella into place and aligning  correctly.  Then using the  ring type of clamp, the patella was clamped into place.  Excess cement was  then removed circumferentially about the surface of the implant patella bone  surface.  This was allowed to go on to fully harden and the trial femur  component was placed into place; and the knee brought into full extension  until the  cement had completed hardened, attempting to mold in a slight  valgus.  With this completed and the cement completely hardened, then the  femoral trial was removed.  Proximal tibial tray trial was removed.  The  patella area carefully debrided with any excess cement using a 1/4-inch  osteotome and a 10-blade scalpel.   The knee was brought into flexion; and carefully the distal surfaces of the  femur were then reirrigated with pulsatile lavage and dried while cement was  being mixed.  Cement was then placed in the distal peg holes, then over the  distal portions of the femur with a small amount within the box area of the distal femoral component from the anterior distal surface of the femur.  Additional cement was placed over the posterior trays of the femoral  components.  Carefully, then the prosthesis was brought down and aligned  with the peg holes through the distal portion of the femur.  This was then  carefully impacted into place.  No attempts were made to provide for more  valgus alignment with the femur impacting more along the lateral aspect and  lateral condyle medially; however, at the end of the impaction of the  prosthesis less than 1-mm cement noted both medial or lateral between the  component bone.   Excess cement was then removed using the patient's [employer].  Posterior tray is examined, and cement removed from this area as well as  from the area of the box cut as well.  Any remaining bone material was  removed as well.  Careful irrigation was performed.  Note, that tourniquet  was then released; and there was no active bleeding  present with release of  tourniquet at 2 hours 5 minutes.  The knee was then able to be brought into  full extension until cement had completely hardened and completely dried  with a tendency to place into valgus-type stress.  With the completion of  the cement and complete hardening, then the knee was placed into a full  range of motion with flexion over 130-degrees, full extension stable with  both medial, lateral, and collateral stress tests.  The patient showed 3  degrees of varus deformity by the computer model; however, clinically there  appeared to be in slight valgus.   Note that during the cutting of the patella, the patella cutting instrument,  itself, was noted to have a fracture of its surface and that there was a  washer missing.  It is not know if the washer was missing prior to the  instrument being brought onto the field or not.  The mini C-arm was brought  out onto the field and used to examine the knee, at the end of the case,  demonstrating no sign of any foreign material or any foreign body within the  knee at the end of the case.  Irrigation was then carried out of the left  knee.  A medium Hemovac drain placed in the depth of the incision over the  left lateral aspect of the total joint replacement exiting over the proximal  lateral aspect of the knee area.  There was no active present bleeding  except for some subcutaneous veins which were cauterized with regular  cautery.  The synovial lining was then approximated with interrupted #0 and  a running stitch of #0 Vicryl.  The distal quad mechanism was reapproximated  with interrupted #1 Vicryl sutures; and the retinaculum of the knee  approximated with interrupted #1 Vicryl  sutures.  Patella tendon attached to  soft tissues over the medial aspect of the incision with number #1 Vicryl  sutures.   The external retinaculum of the knee was approximated with interrupted #0  Vicryl sutures obtaining a nice water seal  closure.  The deep subcu layers were approximated with interrupted #0 Vicryl sutures, both proximally and  distally.  More superficial with interrupted 2-0 Vicryl sutures; and the  skin closed with a running subcu stitch of 3-0 Vicryl.  Tincture of Benzoin  and Steri-Strips were applied.  Note that the Schanz screws were removed  prior to the closure of the skin incisions, after full evaluation of the  knee at the end of case.  The two distal Schanz screws were removed; and the  patient had approximation of the skin edges and subcu layers with  interrupted 2-0 Vicryl sutures; and the skin, itself, with subcu sutures of  4-0 Vicryl.  Tincture of Benzoin and Steri-Strips were applied here.  Then 4  x 4s and ABD pad fixed to the skin, and an Ace wrap applied from the left  foot to the left thigh.  The Hemovac drain was charged.  The patient's left  leg was then placed into a knee immobilizer in full extension.  The patient  underwent a prep of the right knee anterior medial aspect with Betadine  scrub.  The area of previous medial inferior portal incision scar from  arthroscopy an 18-gauge needle was placed and then using a 10 mL syringe  with 5 mL of 0.5% Marcaine plain, and 1 mL of 40 mg of Depo Medrol per mL  was injected into the medial joint line of the right knee.  After pressure  then, a Band-Aid was applied.   The patient was then reactivated, extubated, and returned to the recovery  room in satisfactory condition.  Total tourniquet time was 2 hours and 5  minutes at 300 mmHg.  The patient had normal sponge count and normal  instrument count.  Verification with the mini C-arm demonstrated no evidence  of retained foreign material or instrument material within this patient's  left knee.  Note, that postoperative radiographs in the AP and lateral view,  in the recovery room, also did not demonstrate any significant foreign  material in the left knee postoperatively, other than expected  implants.      Kerrin Champagne, M.D.  Electronically Signed     JEN/MEDQ  D:  05/01/2006  T:  05/02/2006  Job:  [id number]

## 2011-01-27 NOTE — Discharge Summary (Signed)
NAMEGLENYCE, [name]                ACCOUNT NO.:  [id number]   MEDICAL RECORD NO.:  [id number]          PATIENT TYPE:  INP   LOCATION:  5021                         FACILITY:  [facility]   PHYSICIAN:  Kerrin Champagne, M.D.   DATE OF BIRTH:  Feb 23, 1941   DATE OF ADMISSION:  05/01/2006  DATE OF DISCHARGE:  05/04/2006                                 DISCHARGE SUMMARY   DISCHARGE DIAGNOSES:  1. Left knee severe bicompartment osteoarthritis.  2. Hypertension.  3. History of hysterectomy.  4. History of previous bilateral knee arthroscopy.   PROCEDURES PERFORMED DURING THIS HOSPITALIZATION:  May 01, 2006:  Left  cemented Depuy PFC total knee arthroplasty, computer assisted.  Surgeon was  Dr. Vira Browns; assistant, Dr. Dorene Grebe.  Procedure performed under  general anesthetic with left femoral block.   CHIEF COMPLAINT:  Left knee pain.   HISTORY OF PRESENT ILLNESS:  Patient a 70 year old female with history of  previous right knee arthroscopy with torn medial meniscus and popliteal cyst  excision.  This did well.  She developed a left knee pain and discomfort  which regressed over time requiring anti-inflammatory agents.  She underwent  an arthoscopic evaluation and was found to have severe degenerative changes  involving the patellofemoral mediolateral joint surfaces.  These changes are  consistent with severe osteoarthritis.  Following the surgery, she underwent  physical therapy, viscoelastic supplement replacement, along with steroid  injections without relief of pain.  Continued use of anti-inflammatory  agents eventually resulting in the necessity for narcotic medicines on low  level to relieve her pain.  Positive for night pain as well.  She elected to  undergo total knee arthroplasty to relieve pain.   ALLERGIES:  None.   HER MEDICATIONS ON ADMISSION:  1. Aspirin 81 mg a day.  2. Calcium.  3. Vitamin D.  4. Zyrtec.  5. Lisinopril 10 mg p.o. daily.  6. Ativan p.r.n.  7.  Relafen 500 mg 2 tablets p.o. daily.   Past medical history is significant for a hysterectomy, previous  hypertension.   Family history is significant for hypertension and rheumatoid arthritis.   ADMISSION VITAL SIGNS:  Temperature 98.1, pulse 62, respirations 16, blood  pressure 118/60.   PHYSICAL EXAMINATION:  Alert, oriented x4.  Well-developed, well-nourished  70 year old female.  Pupils are equal and reactive to light and accommodation.  Extraocular  muscle movements intact.  Neck was supple.  Full range of motion.  No thyromegaly.  Minimal thyroid  nodule followed by Dr. Drue Novel in the past.  Chest was clear to auscultation.  BREASTS:  Female.  Mammogram she receives yearly.  Obtains physical exam  through her primary care physician.  HEART:  Regular rate and rhythm without a murmur, rub, or gallop.  ABDOMEN:  Soft, nontender.  Positive bowel sounds.  No masses.  No  organomegaly.  GU:  Female.  Again deferred to her primary care physician.  LOWER EXTREMITIES:  Left knee with effusion present, 1+.  Range of motion is  10 degrees short of full extension 125 degrees flexion.  Knee stable to  varus-valgus stress.  Medial osteophytes.  SKIN:  With previous arthroscopy incisions that are well healed.  left  shoulder well healed.  Radiograph showed degenerative joint disease left knee tricompartment.   ADMISSION LABORATORY TESTS:  Her preoperative studies indicated a white cell  count of 6.9, hemoglobin 12.6, hematocrit 36.7, platelet count 351.  PT and  INR were normal.  Her sodium and potassium chloride, BUN, creatinine,  calcium all normal.  Urinalysis normal.   HOSPITAL COURSE:  Ms. Carlis Burnsworth. Donado was admitted following a left total  knee arthroplasty computer assisted performed on May 01, 2006.  She was  admitted to the postoperative recovery unit where she remained stable and  was discharged then to the orthopedic floor.  She had a Hemovac drain, left  knee, with long  length knee strap and then placed into CPM in the recovering  room, maintained on CPM for 8 to 12 hours.  Her range of motion 0 to 30  degrees improved with 10 to 15 or even 20 degrees daily over the next  several days.  At the time of her discharge, her range of motion was 0 to 70  degrees.  She was ambulating, using a walker.  Weightbearing as tolerated.  She did extremely well using her PCA for only 1 day and then was  transitioned to oral narcotic medicines.  Her postoperative hemoglobin and  hematocrit 11 and 32 on day 1.  Remained stable at 10.1 and 29.7 on day 2  and 3.  Placed on Coumadin postoperatively.  INR 1.9 on postoperative day  number 2.  She did well, was able to demonstrate excellent ability to  transfer, to stand and ambulate with walker.  Weightbearing as tolerated on  the left lower extremity.  Her dressing was changed on postoperative day 2  and rechanged on the day of her discharge.  Demonstrated no erythema,  drainage, or significant swelling.  Patient was discharged home on  postoperative day number 3 afebrile.  Pain well controlled on hydrocodone by  mouth.  Tolerating p.o. liquids and foods.   DISPOSITION:  Mrs. Jheri Mitter was discharged home on May 04, 2006,  postoperative day number 3 from a left total knee arthroplasty.  She is to  continue on her Coumadin daily.  The medication is to be controlled by home  health nursing and Coumadin maintenance.  She is to receive home health  physical therapy and occupational therapy 3 times weekly to improve her  range of motion, strength, and ability to ambulate.  She is to continue  Trinsicon or iron tablets to improve her minimal anemia.  At time of  discharge, patient was stable.  Discharge status improved.  She is to return  for followup evaluation 2 weeks from the time of her discharge to [facility], Dr. Vira Browns.      Kerrin Champagne, M.D.  Electronically Signed     JEN/MEDQ  D:   06/28/2006  T:  06/29/2006  Job:  [id number]

## 2011-01-27 NOTE — Op Note (Signed)
[name], [name]                ACCOUNT NO.:  [id number]   MEDICAL RECORD NO.:  [id number]          PATIENT TYPE:  AMB   LOCATION:  [facility]                         FACILITY:  [facility]   PHYSICIAN:  Kerrin Champagne, M.D.   DATE OF BIRTH:  09-03-1941   DATE OF PROCEDURE:  12/19/2005  DATE OF DISCHARGE:                                 OPERATIVE REPORT   PREOPERATIVE DIAGNOSIS:  Left knee degenerative joint disease with torn  medial and lateral meniscus, left shoulder bursitis and tendinitis.   POSTOPERATIVE DIAGNOSIS:  Complex tear left knee lateral anterior horn  lateral meniscus as well as the lateral rim.  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  A small partial tear of the ACL lateral aspect.  Left shoulder bursitis and  tendinitis.   REASON FOR ADMISSION:  Left knee diagnostic and operative arthroscopy with  partial 50% lateral meniscal resection, chondroplastic shaving of the medial  femoral condyle, lateral femoral condyle, and lateral tibial plateau,  injection of the left shoulder and subacromial space.   SURGEON:  Kerrin Champagne, M.D.   ASSISTANT:  Wende Neighbors, P.A.   ANESTHESIA:  General via orotracheal intubation, Jill Side, M.D.  supplemented with local infiltration with Marcaine 0.5% with 1:200,000  epinephrine, approximately 15 mL right knee and portals both inferolateral,  inferomedial, and anteriorly.  The left shoulder injection using 40 mg of  Depo-Medrol per mL 1 mL with 4 mL of Marcaine 0.5% plain.   INDICATIONS FOR PROCEDURE:  This patient is a 70 year old female who has had  previous right knee surgery including resection of popliteal cyst and right  knee arthroscopy.  She was found to have degenerative joint disease of the  right knee with primarily patellofemoral changes.  Underwent shaving of the  patellofemoral joint with excision of the popliteal cyst.  She has done well  following this procedure and has developed worsening left knee discomfort  and pain.  Recently underwent a series of Hyalgan injections which  benefitted the right knee, but not her left.  Because of persistent pain and  discomfort and recurring episodes of joint effusion, left knee MRI scan was  done and this demonstrated torn medial and lateral meniscus as well as  tricompartmental osteoarthritis changes.  Because of the findings of  significant torn meniscus, particularly lateral meniscus, it was felt that  arthroscopic surgery potentially may be of some benefit in relieving this  patient's pain and perhaps delaying the need for total knee replacement.   FINDINGS:  Complex tear of the lateral meniscus in the left knee was found  to be present involving primarily the anterior horn and the lateral rim of  the left lateral meniscus.  This extended out to the outer 1/3, but was  complex and involved the central and medial thirds of the meniscus as well  as and therefore a total meniscectomy was carried out over the anterior 50%  of the lateral meniscus.  She was found  to have grade 4 changes involving  the lateral femoral condyle down to bone essentially over the weightbearing  surface of the lateral femoral condyle and grade 2 changes anteriorly and  laterally.  Also changes down to bone in the posterior lateral aspect of the  lateral tibial plateau, medial femoral condyle weightbearing surface also  showing some grade 3 and grade 4 changes of chondromalacia.  The medial  meniscus, however, appeared to be in excellent condition and assessment  using probe demonstrated no evidence of instability of this meniscus or need  for excision or partial excision.  Patellofemoral joint surface appeared to  be quite smooth.   DESCRIPTION OF PROCEDURE:  After adequate general anesthesia with the  patient in a supine  position, the left shoulder was prepped with Betadine  prep and then alcohol.  An injection then made directly lateral and slightly  posterior to the acromion just entering at this area below the lateral  aspect of the acromion into the subacromial space.  Injection was then  carried out with 40 mg of Depo-Medrol to 1 mL.  1 mL of this and 4 mL of  Marcaine into the subacromial space withdrawing to assure that we were not  within a blood vessel.  This was then swabbed and Band-Aid applied.  Left  knee prepped with DuraPrep solution, left knee holder was used with a  tourniquet about the upper thigh which was not inflated.  The patient had  standard prep with DuraPrep from the upper thigh to the left ankle, was  draped in the usual fashion.   The knee inflated with 60 mL of irrigant solution.  Stab incision then made  with a 15 blade scalpel into the lateral joint line and anterolateral  inferior portal.  This was done, first localizing with the needle during the  infiltration of the skin using Marcaine 0.5% with 1:200,000 epinephrine and  then using a 15 blade scalpel to incise the skin and subcutaneous layers,  and then penetrating the joint with the sleeve for the arthroscopy and using  a blunt probe.  The arthroscope was then placed and the examination of the  knee begun.  First examining the medial joint space and medial compartment  and this showing grade 3 and grade 4 changes of the medial femoral condyle,  however, the meniscus appeared to be in good condition.  The ACL appeared to  be in fairly good shape with some fibers over the lateral aspect of the ACL  torn.  This was documented using photo micrographs as was the medial  compartment changes.  The lateral compartment further observed and found to  have grade 4 changes involving lateral femoral condyle and this was shaved  initially to allow for smoothing of the lateral femoral condyle and also to allow for further exposure of  the lateral joint surface using the lateral  compartment portal.  On examining the meniscus, it was apparent that she did  have a complex tear involving the anterior horn and lateral rim of the  lateral meniscus.  This was complex involving complex tear of the medial  surface of the meniscus and extending out into the lateral margin,  particularly along the lateral rim and anterior aspect of the meniscus.  The  portal was then changed for the arthroscopic equipment from lateral to  medial, placing the scope medially and then using our instruments through  the lateral portal and shaving was performed over this anterior aspect of  the lateral  meniscus as well as the lateral rim carried out.  It was  apparent after shaving and further evaluation that the meniscus was quite  unstable and continued to be able to be pulled into the joint demonstrating  significant instability.  Therefore, a meniscal excision was carried out  over the anterior 50% of the lateral meniscus using a basket to resect the  anterolateral corner of the lateral meniscus first and then using the shaver  then to carefully shave and trim and smooth the remaining portions of the  meniscus here to the point where the meniscus was then quite stable.  There  was some residual anteromedial meniscal material as well as some residual  posterolateral rim of the lateral meniscus.  The entire posterior horn of  the lateral meniscus was preserved.  This completed the meniscal resection,  partial resection laterally.  Irrigation was then performed of the joint  line and careful evaluation of the lateral femoral condyle demonstrating  that this was shaved to a nice smooth appearance.  The lateral tibial  plateau also required some shaving particularly in the posterolateral corner  where there was some grade 4 changes here down to bone and this was smoothed  appropriately.  Irrigation carried out and evaluation was carried out of the   retropatellar surface in the patellofemoral joint.  This showed a nice  smooth surface with perhaps grade 1 or grade 2 changes of chondromalacia,  not sufficient to require any significant arthroscopic procedure here.  The  knee was then irrigated with copious amounts of irrigant solution and this  solution was then expressed from the knee joint.  The portals then  inferolateral and inferomedial were then closed with interrupted subcu  sutures of 3-0 Vicryl and the skin was closed with 4-0 nylon sutures.  4x4's  and ABD pad affixed to the skin with Curlex and Ace wrap applied.  The  patient then reactivated, extubated, and returned to the recovery room in  satisfactory condition.  All instrument and sponge counts were correct.  Photo micrographs were done documenting the procedure and these are  available to our office for review.   POSTOPERATIVE CARE:  The patient will use crutches and be weightbearing as tolerated in the left lower extremity.  Range of motion as tolerated.  Be  seen back in the office in 2 weeks for follow-up.  Percocet 5/325 #50 one to  two q.4-6 hours p.r.n. pain, one full strength aspirin to be taken twice a  day.  I would life for her to restart her Relafen 1000 mg p.o. daily with  meal or snack.      Kerrin Champagne, M.D.  Electronically Signed     JEN/MEDQ  D:  12/19/2005  T:  12/19/2005  Job:  [id number]

## 2011-01-27 NOTE — Assessment & Plan Note (Signed)
[facility]                           GASTROENTEROLOGY OFFICE NOTE   NAME:[name], [name]                       MRN:          [id number]  DATE:07/17/2006                            DOB:          21-Oct-1940    REFERRING PHYSICIAN:  [name], MD   REASON FOR REFERRAL:  Family history of colon cancer, personal history of  adenomatous colon polyps.   [name] is a 69 year old white female that I previously evaluated.  Her  father developed colon cancer in his 46s.  She underwent colonoscopy in July  2004 which showed a single 3-mm adenomatous colon polyp.  The remainder of  the examination was unremarkable.  She returns with no ongoing  gastrointestinal complaints and specifically denies any change in bowel  habits, change in stool caliber, diarrhea, constipation, melena,  hematochezia, abdominal pain, or rectal pain.  There has been no change in  her family history.  She does relate that she had unusual symptoms following  her colonoscopy with bilateral itchy eyes and a runny nose, as well as a  change in her sense of smell.  She was diagnosed with allergies.  She does  relate that these symptoms began acutely following her colonoscopy.  She has  some mild problems with intermittent hemorrhoid swelling and she is status  post a left total knee replacement since I last saw her.   CURRENT MEDICATIONS:  Listed on the chart updated and reviewed.   MEDICATION ALLERGIES:  Very questionable reaction to FENTANYL and VERSED as  described in the history of present illness.  I suspect this was not a  reaction to the medications.   PAST MEDICAL HISTORY:  1. Hypertension.  2. History of an enlarged thyroid.  3. Status post left total knee replacement.  4. Status post TAH/BSO 1998.  5. Status post incisional hernia repair.  6. Status post face lift.   SOCIAL HISTORY AND REVIEW OF SYSTEMS:  Per the handwritten form.   PHYSICAL EXAMINATION:  GENERAL:   Well-developed, well-nourished, no acute  distress.  VITAL SIGNS:  Height 5 feet 5 inches, weight 170 pounds.  Blood pressure is  128/62, pulse 60 and regular.  HEENT:  Anicteric sclerae, oropharynx clear.  CHEST:  Clear to auscultation bilaterally.  CARDIAC:  Regular rate and rhythm without murmurs appreciated.  ABDOMEN:  Soft, nontender, nondistended, normal active bowel sounds.  No  palpable organomegaly, masses, or hernias.  EXTREMITIES:  Without clubbing, cyanosis, or edema.  NEUROLOGIC:  Alert and oriented x3.  Grossly nonfocal.   ASSESSMENT AND PLAN:  Personal history of adenomatous colon polyps, first  degree relative with colon cancer and minor hemorrhoidal symptoms.  Standard  hemorrhoidal care instructions provided.  Will given diphenhydramine before  fentanyl and versed. Plan for surveillance colonoscopy in July 2009.     Venita Lick. Russella Dar, MD, [facility]  Electronically Signed    MTS/MedQ  DD: 07/30/2006  DT: 07/30/2006  Job #: [id number]   cc:   [name], MD

## 2011-02-02 ENCOUNTER — Other Ambulatory Visit: Payer: Self-pay | Admitting: Internal Medicine

## 2011-03-13 ENCOUNTER — Ambulatory Visit (HOSPITAL_COMMUNITY)
Admission: RE | Admit: 2011-03-13 | Discharge: 2011-03-13 | Disposition: A | Discharge status: Home / Self Care | Payer: Medicare Other | Source: Ambulatory Visit | Attending: Orthopedic Surgery | Admitting: Orthopedic Surgery

## 2011-03-13 ENCOUNTER — Other Ambulatory Visit (HOSPITAL_COMMUNITY): Payer: Self-pay | Admitting: Orthopedic Surgery

## 2011-03-13 ENCOUNTER — Encounter (HOSPITAL_COMMUNITY)
Admission: RE | Admit: 2011-03-13 | Discharge: 2011-03-13 | Disposition: A | Discharge status: Home / Self Care | Payer: Medicare Other | Source: Ambulatory Visit | Attending: Orthopedic Surgery | Admitting: Orthopedic Surgery

## 2011-03-13 DIAGNOSIS — M19019 Primary osteoarthritis, unspecified shoulder: Secondary | ICD-10-CM | POA: Insufficient documentation

## 2011-03-13 DIAGNOSIS — J438 Other emphysema: Secondary | ICD-10-CM | POA: Insufficient documentation

## 2011-03-13 DIAGNOSIS — Z01818 Encounter for other preprocedural examination: Secondary | ICD-10-CM | POA: Insufficient documentation

## 2011-03-13 DIAGNOSIS — M75100 Unspecified rotator cuff tear or rupture of unspecified shoulder, not specified as traumatic: Secondary | ICD-10-CM

## 2011-03-13 DIAGNOSIS — Z01812 Encounter for preprocedural laboratory examination: Secondary | ICD-10-CM | POA: Insufficient documentation

## 2011-03-13 DIAGNOSIS — Z01810 Encounter for preprocedural cardiovascular examination: Secondary | ICD-10-CM | POA: Insufficient documentation

## 2011-03-13 LAB — APTT: aPTT: 26 seconds (ref 24–37)

## 2011-03-13 LAB — CBC
HCT: 42.3 % (ref 36.0–46.0)
Hemoglobin: 14 g/dL (ref 12.0–15.0)
MCH: 29.9 pg (ref 26.0–34.0)
MCHC: 33.1 g/dL (ref 30.0–36.0)
MCV: 90.2 fL (ref 78.0–100.0)
Platelets: 307 10*3/uL (ref 150–400)
RBC: 4.69 MIL/uL (ref 3.87–5.11)
RDW: 13.2 % (ref 11.5–15.5)
WBC: 8.8 10*3/uL (ref 4.0–10.5)

## 2011-03-13 LAB — COMPREHENSIVE METABOLIC PANEL
ALT: 15 U/L (ref 0–35)
AST: 25 U/L (ref 0–37)
Albumin: 4.4 g/dL (ref 3.5–5.2)
Alkaline Phosphatase: 78 U/L (ref 39–117)
BUN: 16 mg/dL (ref 6–23)
CO2: 30 mEq/L (ref 19–32)
Calcium: 10.3 mg/dL (ref 8.4–10.5)
Chloride: 98 mEq/L (ref 96–112)
Creatinine, Ser: 0.84 mg/dL (ref 0.50–1.10)
GFR calc Af Amer: >60 mL/min (ref >60)
GFR calc non Af Amer: >60 mL/min (ref >60)
Glucose, Bld: 87 mg/dL (ref 70–99)
Potassium: 4.8 mEq/L (ref 3.5–5.1)
Sodium: 137 mEq/L (ref 135–145)
Total Bilirubin: 0.3 mg/dL (ref 0.3–1.2)
Total Protein: 6.4 g/dL (ref 6.0–8.3)

## 2011-03-13 LAB — URINALYSIS, ROUTINE W REFLEX MICROSCOPIC
Bilirubin Urine: NEGATIVE
Glucose, UA: NEGATIVE mg/dL
Hgb urine dipstick: NEGATIVE
Ketones, ur: NEGATIVE mg/dL
Nitrite: NEGATIVE
Protein, ur: NEGATIVE mg/dL
Specific Gravity, Urine: 1.005 (ref 1.005–1.030)
Urobilinogen, UA: 0.2 mg/dL (ref 0.0–1.0)
pH: 7 (ref 5.0–8.0)

## 2011-03-13 LAB — PROTIME-INR
INR: 0.95 (ref 0.00–1.49)
Prothrombin Time: 12.9 seconds (ref 11.6–15.2)

## 2011-03-13 LAB — URINE MICROSCOPIC-ADD ON

## 2011-03-13 LAB — SURGICAL PCR SCREEN
MRSA, PCR: NEGATIVE
Staphylococcus aureus: POSITIVE — AB

## 2011-03-16 ENCOUNTER — Inpatient Hospital Stay (HOSPITAL_COMMUNITY)
Admission: RE | Admit: 2011-03-16 | Discharge: 2011-03-17 | DRG: 484 | Disposition: A | Discharge status: Home / Self Care | Payer: Medicare Other | Source: Ambulatory Visit | Attending: Orthopedic Surgery | Admitting: Orthopedic Surgery

## 2011-03-16 DIAGNOSIS — M67919 Unspecified disorder of synovium and tendon, unspecified shoulder: Principal | ICD-10-CM | POA: Diagnosis present

## 2011-03-16 DIAGNOSIS — Z01810 Encounter for preprocedural cardiovascular examination: Secondary | ICD-10-CM

## 2011-03-16 DIAGNOSIS — I10 Essential (primary) hypertension: Secondary | ICD-10-CM | POA: Diagnosis present

## 2011-03-16 DIAGNOSIS — E041 Nontoxic single thyroid nodule: Secondary | ICD-10-CM | POA: Diagnosis present

## 2011-03-16 DIAGNOSIS — Z01818 Encounter for other preprocedural examination: Secondary | ICD-10-CM

## 2011-03-16 DIAGNOSIS — F411 Generalized anxiety disorder: Secondary | ICD-10-CM | POA: Diagnosis present

## 2011-03-16 DIAGNOSIS — Z01812 Encounter for preprocedural laboratory examination: Secondary | ICD-10-CM

## 2011-03-16 DIAGNOSIS — Z87891 Personal history of nicotine dependence: Secondary | ICD-10-CM

## 2011-03-16 DIAGNOSIS — Z79899 Other long term (current) drug therapy: Secondary | ICD-10-CM

## 2011-03-16 DIAGNOSIS — Z7982 Long term (current) use of aspirin: Secondary | ICD-10-CM

## 2011-03-16 DIAGNOSIS — M719 Bursopathy, unspecified: Principal | ICD-10-CM | POA: Diagnosis present

## 2011-03-16 LAB — TYPE AND SCREEN
ABO/RH(D): B NEG
Antibody Screen: NEGATIVE

## 2011-03-18 NOTE — Op Note (Signed)
NAMECLEORA, KARNIK                ACCOUNT NO.:  [id number]  MEDICAL RECORD NO.:  [id number]  LOCATION:  5007                         FACILITY:  [facility]  PHYSICIAN:  Vania Rea. Braleigh Massoud, M.D.  DATE OF BIRTH:  1941-09-08  DATE OF PROCEDURE:  03/16/2011 DATE OF DISCHARGE:                              OPERATIVE REPORT   PREOPERATIVE DIAGNOSIS:  Right shoulder end-stage rotator cuff tear arthropathy.  POSTOPERATIVE DIAGNOSIS:  Right shoulder end-stage rotator cuff tear arthropathy.  PROCEDURE:  A right reverse shoulder arthroplasty utilizing a cemented size 10/1 DePuy stem with a 38 eccentric glenosphere and a +3 poly.  SURGEON:  Vania Rea. Ahrianna Siglin, MD  ASSISTANT:  Lucita Lora. Shuford, PA-C  ANESTHESIA:  General endotracheal as well as an interscalene block.  ESTIMATED BLOOD LOSS:  250 mL.  DRAINS:  None.  HISTORY:  Ms. Nhem is a 70 year old female who has had chronic and progressively increasing right shoulder pain with functional limitations, associated weakness related to rotator cuff tear arthropathy.  Due to her ongoing pain and increasing functional limitations, she is brought to the operating at this time for planned right reverse shoulder arthroplasty.  We counseled Ms. Depaoli on treatment options as well as risks versus benefits thereof.  Possible surgical complications were reviewed including the potential for bleeding, infection, neurovascular injury, persistent pain, loss of motion, failure of the implant, possible need for additional surgery.  She understands and accepts and agrees for planned procedure.  PROCEDURE IN DETAIL:  After undergoing routine preop evaluation, the patient received prophylactic antibiotics.  An interscalene block was established in holding area by the Anesthesia Department.  She was placed supine on operating table, underwent smooth induction of general endotracheal anesthesia.  Placed into beach-chair position and appropriate padded protected.   Right shoulder girdle region was then sterilely prepped and draped in standard fashion.  Time-out was called. An anterior deltopectoral approach to the right shoulder was made through a 15-cm incision beginning at the coracoid extending laterally and distally.  Skin flaps were elevated and electrocautery used for hemostasis.  Dissection carried deeply with identification of the cephalic vein which was retracted lateral to deltoid and the deltopectoral interval was then developed and retractor was placed. Conjoined tendon was then identified, mobilized, and retracted medially. The bicep tendon was then tenotomized which was amazingly enough still intact, although the balance of the rotator cuff was completely deficient and denuded and there were residual bursal tissue was excised. We divided capsule tissues away from the anterior and inferior margin of the humeral head and this allowed the head to be elevated through the wound.  Showed changes consistent with a chronic cuff tear arthropathy. A rongeur was then used to create a defect in the cortex of the humeral head superiorly such that access could be gained into the humoral medullary canal and hand reaming was then performed with a size 10. Size 10 cutting guide was then placed and at the appropriate level, we placed the cutting jig for the humeral head resection at 0 degrees retroversion.  The humeral head was then resected with a saw.  Cut surface of the proximal humerus was then protected with the metal cap. We  then turned our attention to the glenoid which was exposed with a Fukuda retractor posteriorly, pitchfork, and snake tongue retractors anteriorly, superiorly, and inferiorly as necessary.  This allowed [place] to remove the entire labral tissues and associated capsule insertions circumferentially along the glenoid as well as residual stump of the biceps proximally.  Once we gained circumferential exposure of the glenoid, we then  placed a central guide pin.  We then reamed the glenoid to a flush surface of subchondral bone, placed the peripheral reamer to remove residual peripheral osteophytes and then reamed the central drill hole.  We impacted the glenoid baseplate into position with excellent bony purchase and then placed an inferior and superior locking screw and then an anterior-posterior nonlocking screw all obtaining good bony purchase in the screws and the inferior screws, locking screws were then locked with excellent fixation.  A 38 eccentric glenosphere was then directed over the glenoid baseplate and tightened in position with excellent fixation.  Once this was completed, we turned our attention to the proximal humerus.  We placed the intramedullary guide for the proximal humeral reaming and a size 1 reamer was then used to sculpt the metaphyseal region of the proximal humerus.  We then performed an initial trial with the 10/1 implant and the soft tissue envelope was somewhat tight, so we replaced the metaphyseal guide seated it slightly deeper and then once again reamed the metaphyseal region and once this was completed, the trial showed excellent fixation and excellent soft tissue balance with the +3 poly.  At this point, we then removed the trial.  Distal cement plug was placed.  The canal was irrigated.  Cement was mixed and at the appropriate consistency was introduced into the humeral canal in retrograde fashion, pressurized, and then the final stem was placed at appropriate level at 0 degrees retroversion.  Once suspended hardened, we performed meticulous removal of all extra cement. Trial reduction was again performed showing good soft tissue balance with +3 poly.  The final +3 poly was then impacted into the stem.  Final reduction was performed.  Excellent shoulder motion and stability was achieved.  Deltopectoral interval was then reapproximated with 0 Vicryl figure-of-eight sutures.  A 2-0  Vicryl used for the subcu layer and intracuticular 3-0 Monocryl for the skin followed by Steri-Strips.  Dry dressing taped throughout the right shoulder, right arm was placed in sling.  The patient was then awakened and extubated, and taken to the recovery room in stable condition.     Vania Rea. Lorann Tani, M.D.     KMS/MEDQ  D:  03/16/2011  T:  03/16/2011  Job:  [id number]  Electronically Signed by Francena Hanly M.D. on 03/18/2011 10:34:03 AM

## 2011-04-05 ENCOUNTER — Other Ambulatory Visit: Payer: Self-pay

## 2011-04-05 NOTE — Telephone Encounter (Signed)
Last seen 01/19/11 Naproxen filled 04/27/08 and Lorazepam filled 10/12/10 # 100 with 1 refill.  Please advise    KP

## 2011-04-07 ENCOUNTER — Telehealth: Payer: Self-pay | Admitting: Internal Medicine

## 2011-04-07 MED ORDER — NAPROXEN 500 MG PO TABS: 500.0000 mg | ORAL_TABLET | Freq: Two times a day (BID) | ORAL | Status: DC

## 2011-04-07 MED ORDER — LORAZEPAM 0.5 MG PO TABS: 0.5000 mg | ORAL_TABLET | ORAL | Status: DC

## 2011-04-07 NOTE — Telephone Encounter (Signed)
Ativan #100, 1 RF naprosyn #60, 3 RF

## 2011-04-07 NOTE — Telephone Encounter (Signed)
Pt is aware meds were sent in, and we request 24-48 hours for prescription refills.

## 2011-04-10 ENCOUNTER — Other Ambulatory Visit: Payer: Self-pay | Admitting: Internal Medicine

## 2011-04-17 ENCOUNTER — Ambulatory Visit: Payer: Medicare Other | Attending: Orthopedic Surgery | Admitting: Physical Therapy

## 2011-04-17 ENCOUNTER — Other Ambulatory Visit: Payer: Self-pay | Admitting: Internal Medicine

## 2011-04-17 DIAGNOSIS — Z1231 Encounter for screening mammogram for malignant neoplasm of breast: Secondary | ICD-10-CM

## 2011-04-17 DIAGNOSIS — M25669 Stiffness of unspecified knee, not elsewhere classified: Secondary | ICD-10-CM | POA: Insufficient documentation

## 2011-04-17 DIAGNOSIS — M25569 Pain in unspecified knee: Secondary | ICD-10-CM | POA: Insufficient documentation

## 2011-04-17 DIAGNOSIS — IMO0001 Reserved for inherently not codable concepts without codable children: Secondary | ICD-10-CM | POA: Insufficient documentation

## 2011-04-17 DIAGNOSIS — R262 Difficulty in walking, not elsewhere classified: Secondary | ICD-10-CM | POA: Insufficient documentation

## 2011-04-17 DIAGNOSIS — Z96659 Presence of unspecified artificial knee joint: Secondary | ICD-10-CM | POA: Insufficient documentation

## 2011-04-25 ENCOUNTER — Encounter: Payer: Medicare Other | Admitting: Physical Therapy

## 2011-05-02 ENCOUNTER — Encounter: Payer: Medicare Other | Admitting: Physical Therapy

## 2011-05-12 ENCOUNTER — Ambulatory Visit
Admission: RE | Admit: 2011-05-12 | Discharge: 2011-05-12 | Disposition: A | Discharge status: Home / Self Care | Payer: Medicare Other | Source: Ambulatory Visit | Attending: Internal Medicine | Admitting: Internal Medicine

## 2011-05-12 DIAGNOSIS — Z1231 Encounter for screening mammogram for malignant neoplasm of breast: Secondary | ICD-10-CM

## 2011-05-16 ENCOUNTER — Other Ambulatory Visit: Payer: Self-pay | Admitting: Internal Medicine

## 2011-05-16 NOTE — Telephone Encounter (Signed)
Rx Done . 

## 2011-05-18 NOTE — Telephone Encounter (Signed)
rx called into pharmacy

## 2011-06-15 ENCOUNTER — Other Ambulatory Visit: Payer: Self-pay | Admitting: Internal Medicine

## 2011-06-16 LAB — CBC
HCT: 29.5 % — ABNORMAL LOW (ref 36.0–46.0)
HCT: 29.5 % — ABNORMAL LOW (ref 36.0–46.0)
HCT: 30.5 % — ABNORMAL LOW (ref 36.0–46.0)
HCT: 45.2 % (ref 36.0–46.0)
Hemoglobin: 10 g/dL — ABNORMAL LOW (ref 12.0–15.0)
Hemoglobin: 10.5 g/dL — ABNORMAL LOW (ref 12.0–15.0)
Hemoglobin: 14.9 g/dL (ref 12.0–15.0)
Hemoglobin: 9.9 g/dL — ABNORMAL LOW (ref 12.0–15.0)
MCHC: 33.1 g/dL (ref 30.0–36.0)
MCHC: 33.6 g/dL (ref 30.0–36.0)
MCHC: 34 g/dL (ref 30.0–36.0)
MCHC: 34.3 g/dL (ref 30.0–36.0)
MCV: 92.9 fL (ref 78.0–100.0)
MCV: 92.9 fL (ref 78.0–100.0)
MCV: 93.1 fL (ref 78.0–100.0)
MCV: 93.6 fL (ref 78.0–100.0)
Platelets: 219 10*3/uL (ref 150–400)
Platelets: 245 10*3/uL (ref 150–400)
Platelets: 252 10*3/uL (ref 150–400)
Platelets: 302 10*3/uL (ref 150–400)
RBC: 3.16 MIL/uL — ABNORMAL LOW (ref 3.87–5.11)
RBC: 3.17 MIL/uL — ABNORMAL LOW (ref 3.87–5.11)
RBC: 3.28 MIL/uL — ABNORMAL LOW (ref 3.87–5.11)
RBC: 4.82 MIL/uL (ref 3.87–5.11)
RDW: 13.3 % (ref 11.5–15.5)
RDW: 13.4 % (ref 11.5–15.5)
RDW: 13.7 % (ref 11.5–15.5)
RDW: 13.9 % (ref 11.5–15.5)
WBC: 10.9 10*3/uL — ABNORMAL HIGH (ref 4.0–10.5)
WBC: 11.2 10*3/uL — ABNORMAL HIGH (ref 4.0–10.5)
WBC: 13 10*3/uL — ABNORMAL HIGH (ref 4.0–10.5)
WBC: 9.4 10*3/uL (ref 4.0–10.5)

## 2011-06-16 LAB — URINALYSIS, ROUTINE W REFLEX MICROSCOPIC
Bilirubin Urine: NEGATIVE
Glucose, UA: NEGATIVE mg/dL
Hgb urine dipstick: NEGATIVE
Ketones, ur: NEGATIVE mg/dL
Nitrite: NEGATIVE
Protein, ur: NEGATIVE mg/dL
Specific Gravity, Urine: 1.009 (ref 1.005–1.030)
Urobilinogen, UA: 0.2 mg/dL (ref 0.0–1.0)
pH: 5.5 (ref 5.0–8.0)

## 2011-06-16 LAB — COMPREHENSIVE METABOLIC PANEL
ALT: 23 U/L (ref 0–35)
AST: 27 U/L (ref 0–37)
Albumin: 4.2 g/dL (ref 3.5–5.2)
Alkaline Phosphatase: 77 U/L (ref 39–117)
BUN: 15 mg/dL (ref 6–23)
CO2: 30 mEq/L (ref 19–32)
Calcium: 9.7 mg/dL (ref 8.4–10.5)
Chloride: 98 mEq/L (ref 96–112)
Creatinine, Ser: 0.76 mg/dL (ref 0.4–1.2)
GFR calc Af Amer: >60 mL/min (ref >60)
GFR calc non Af Amer: >60 mL/min (ref >60)
Glucose, Bld: 101 mg/dL — ABNORMAL HIGH (ref 70–99)
Potassium: 4.5 mEq/L (ref 3.5–5.1)
Sodium: 137 mEq/L (ref 135–145)
Total Bilirubin: 0.6 mg/dL (ref 0.3–1.2)
Total Protein: 6.6 g/dL (ref 6.0–8.3)

## 2011-06-16 LAB — BODY FLUID CULTURE: Culture: NO GROWTH

## 2011-06-16 LAB — PROTIME-INR
INR: 1 (ref 0.00–1.49)
INR: 1.1 (ref 0.00–1.49)
INR: 1.8 — ABNORMAL HIGH (ref 0.00–1.49)
INR: 1.9 — ABNORMAL HIGH (ref 0.00–1.49)
Prothrombin Time: 12.8 seconds (ref 11.6–15.2)
Prothrombin Time: 14.4 seconds (ref 11.6–15.2)
Prothrombin Time: 22.2 seconds — ABNORMAL HIGH (ref 11.6–15.2)
Prothrombin Time: 23.1 seconds — ABNORMAL HIGH (ref 11.6–15.2)

## 2011-06-16 LAB — BASIC METABOLIC PANEL
BUN: 12 mg/dL (ref 6–23)
BUN: 4 mg/dL — ABNORMAL LOW (ref 6–23)
CO2: 30 mEq/L (ref 19–32)
CO2: 31 mEq/L (ref 19–32)
Calcium: 8.3 mg/dL — ABNORMAL LOW (ref 8.4–10.5)
Calcium: 8.4 mg/dL (ref 8.4–10.5)
Chloride: 103 mEq/L (ref 96–112)
Chloride: 106 mEq/L (ref 96–112)
Creatinine, Ser: 0.67 mg/dL (ref 0.4–1.2)
Creatinine, Ser: 0.7 mg/dL (ref 0.4–1.2)
GFR calc Af Amer: >60 mL/min (ref >60)
GFR calc Af Amer: >60 mL/min (ref >60)
GFR calc non Af Amer: >60 mL/min (ref >60)
GFR calc non Af Amer: >60 mL/min (ref >60)
Glucose, Bld: 130 mg/dL — ABNORMAL HIGH (ref 70–99)
Glucose, Bld: 147 mg/dL — ABNORMAL HIGH (ref 70–99)
Potassium: 3.9 mEq/L (ref 3.5–5.1)
Potassium: 4.2 mEq/L (ref 3.5–5.1)
Sodium: 136 mEq/L (ref 135–145)
Sodium: 141 mEq/L (ref 135–145)

## 2011-06-16 LAB — TYPE AND SCREEN
ABO/RH(D): B NEG
Antibody Screen: NEGATIVE

## 2011-06-16 LAB — APTT: aPTT: 27 seconds (ref 24–37)

## 2011-06-16 LAB — ABO/RH: ABO/RH(D): B NEG

## 2011-06-16 LAB — ANAEROBIC CULTURE

## 2011-06-16 LAB — GRAM STAIN

## 2011-07-20 ENCOUNTER — Other Ambulatory Visit: Payer: Self-pay | Admitting: Internal Medicine

## 2011-07-20 NOTE — Telephone Encounter (Signed)
Refill done.  

## 2011-08-16 ENCOUNTER — Other Ambulatory Visit (HOSPITAL_COMMUNITY): Payer: Self-pay | Admitting: Orthopedic Surgery

## 2011-08-16 DIAGNOSIS — M25562 Pain in left knee: Secondary | ICD-10-CM

## 2011-08-17 ENCOUNTER — Other Ambulatory Visit (INDEPENDENT_AMBULATORY_CARE_PROVIDER_SITE_OTHER): Payer: Medicare Other

## 2011-08-17 DIAGNOSIS — M2550 Pain in unspecified joint: Secondary | ICD-10-CM

## 2011-08-17 LAB — CBC WITH DIFFERENTIAL/PLATELET
Basophils Absolute: 0 10*3/uL (ref 0.0–0.1)
Basophils Relative: 0.5 % (ref 0.0–3.0)
Eosinophils Absolute: 0.3 10*3/uL (ref 0.0–0.7)
Eosinophils Relative: 2.9 % (ref 0.0–5.0)
HCT: 42 % (ref 36.0–46.0)
Hemoglobin: 14.1 g/dL (ref 12.0–15.0)
Lymphocytes Relative: 30.1 % (ref 12.0–46.0)
Lymphs Abs: 3.1 10*3/uL (ref 0.7–4.0)
MCHC: 33.5 g/dL (ref 30.0–36.0)
MCV: 91.2 fl (ref 78.0–100.0)
Monocytes Absolute: 0.6 10*3/uL (ref 0.1–1.0)
Monocytes Relative: 5.5 % (ref 3.0–12.0)
Neutro Abs: 6.2 10*3/uL (ref 1.4–7.7)
Neutrophils Relative %: 61 % (ref 43.0–77.0)
Platelets: 332 10*3/uL (ref 150.0–400.0)
RBC: 4.6 Mil/uL (ref 3.87–5.11)
RDW: 14.6 % (ref 11.5–14.6)
WBC: 10.2 10*3/uL (ref 4.5–10.5)

## 2011-08-17 LAB — SEDIMENTATION RATE: Sed Rate: 9 mm/hr (ref 0–22)

## 2011-08-17 NOTE — Progress Notes (Signed)
12  

## 2011-08-18 LAB — C-REACTIVE PROTEIN: CRP: 0.1 mg/dL (ref <0.60)

## 2011-08-23 ENCOUNTER — Other Ambulatory Visit: Payer: Self-pay | Admitting: Internal Medicine

## 2011-08-23 NOTE — Telephone Encounter (Signed)
Last OV 01-19-11, last refill 04-05-11 #100 1

## 2011-08-29 NOTE — Telephone Encounter (Signed)
Patient calling, is now completely out of this medication.  Was requested on 08-23-11, is inquiring why has not been approved for pharmacy to fill yet.  Please advise.

## 2011-08-31 ENCOUNTER — Encounter (HOSPITAL_COMMUNITY)
Admission: RE | Admit: 2011-08-31 | Discharge: 2011-08-31 | Disposition: A | Discharge status: Home / Self Care | Payer: Medicare Other | Source: Ambulatory Visit | Attending: Orthopedic Surgery | Admitting: Orthopedic Surgery

## 2011-08-31 DIAGNOSIS — Z96659 Presence of unspecified artificial knee joint: Secondary | ICD-10-CM | POA: Insufficient documentation

## 2011-08-31 DIAGNOSIS — M25562 Pain in left knee: Secondary | ICD-10-CM

## 2011-08-31 DIAGNOSIS — M25569 Pain in unspecified knee: Secondary | ICD-10-CM | POA: Insufficient documentation

## 2011-08-31 MED ORDER — TECHNETIUM TC 99M MEDRONATE IV KIT
25.0000 | PACK | Freq: Once | INTRAVENOUS | Status: AC | PRN
  Administered 2011-08-31: 25 via INTRAVENOUS

## 2011-09-01 ENCOUNTER — Other Ambulatory Visit: Payer: Self-pay | Admitting: Internal Medicine

## 2011-09-01 NOTE — Telephone Encounter (Signed)
Last OV 01-19-11, last filled #100 1 on  04/05/2011

## 2011-09-04 MED ORDER — NAPROXEN 500 MG PO TABS: 500.0000 mg | ORAL_TABLET | Freq: Two times a day (BID) | ORAL | Status: DC

## 2011-09-04 NOTE — Telephone Encounter (Signed)
Ok #100, 1 RF 

## 2011-09-04 NOTE — Telephone Encounter (Signed)
Rx sent 

## 2011-09-14 DIAGNOSIS — T84019A Broken internal joint prosthesis, unspecified site, initial encounter: Secondary | ICD-10-CM | POA: Diagnosis not present

## 2011-09-29 ENCOUNTER — Other Ambulatory Visit: Payer: Self-pay | Admitting: Internal Medicine

## 2011-10-02 NOTE — Telephone Encounter (Signed)
rx sent to pharmacy by e-script  

## 2011-10-10 ENCOUNTER — Encounter (INDEPENDENT_AMBULATORY_CARE_PROVIDER_SITE_OTHER): Payer: Medicare Other | Admitting: *Deleted

## 2011-10-10 ENCOUNTER — Ambulatory Visit (INDEPENDENT_AMBULATORY_CARE_PROVIDER_SITE_OTHER): Payer: Medicare Other | Admitting: Internal Medicine

## 2011-10-10 VITALS — BP 132/84 | HR 65 | Temp 97.2°F | Wt 168.0 lb

## 2011-10-10 DIAGNOSIS — M79606 Pain in leg, unspecified: Secondary | ICD-10-CM

## 2011-10-10 DIAGNOSIS — M79609 Pain in unspecified limb: Secondary | ICD-10-CM

## 2011-10-10 MED ORDER — CYCLOBENZAPRINE HCL 10 MG PO TABS: 10.0000 mg | ORAL_TABLET | Freq: Two times a day (BID) | ORAL | Status: AC | PRN

## 2011-10-10 NOTE — Assessment & Plan Note (Addendum)
Likely a musckcle skeletal issue, normal vascular exam; +FH of DVT Plan: U/s Flexeril prn Call if sx increase Addendum: u/s neg, I spoke w/ patient, she is aware

## 2011-10-10 NOTE — Patient Instructions (Signed)
Call if no better in few days  

## 2011-10-10 NOTE — Progress Notes (Signed)
  Subjective:    Patient ID: [name], female    DOB: Dec 04, 1940, 71 y.o.   MRN: [id number]  HPI Acute visit On-off L leg pain x 4 days, from the knee to ankle (at calf), at rest, sx have been nocturnal and different from knee OA. took hydrocodone w/ some help Has a FH of DVT: concerned about that  Past Medical History: Hypertension (09/11/2005) elevated homocysteine level Anxiety thyroid goiter/nodule -- rt benign colloid nodule -- left hyperplastic nodule - Thyroid bx 2006: neg OA- shoulder pain, knee pain after TKR L, Hx of BAKER'S CYST  Goiter Hx adenomatous colon polyps 03/2003  Past Surgical History: Hysterectomy and oophorectomy L knee arthroscopy, then replacement (left) 2007 Dr Earlie Lou, then a "manipulation,  then redo TKR 11-09  w/ Dr Despina Hick Hip replacement  R (3-11) facelift incisional hernia repair  knee surgery x 5 shoulder surgery 06-2009 R shoulder replacement, 7-12   Social History: Married, 2 children Occupation: Retired Runner, broadcasting/film/video Patient is a former smoker. -Stopped 1985 Alcohol Use - yes-2 glasses daily Daily Caffeine Use-1 cup daily Illicit Drug Use - no Patient walks 2 miles  daily  diet-- healthy   Review of Systems No leg swelling or rash No recent aorplane trip Bach ache and leg cramps at baseline    Objective:   Physical Exam  Constitutional: She appears well-developed and well-nourished.  Cardiovascular:       Nl femoral and pedal pulses  Musculoskeletal:       Calves symmetric, no tender, l knee w/  Limited ROM but no red-swollen  Skin:       No rash at LE, varicose veins w/o phlebitis       Assessment & Plan:

## 2011-10-11 ENCOUNTER — Encounter: Payer: Self-pay | Admitting: Internal Medicine

## 2011-10-17 DIAGNOSIS — IMO0002 Reserved for concepts with insufficient information to code with codable children: Secondary | ICD-10-CM | POA: Diagnosis not present

## 2011-10-17 DIAGNOSIS — M1710 Unilateral primary osteoarthritis, unspecified knee: Secondary | ICD-10-CM | POA: Diagnosis not present

## 2011-11-03 ENCOUNTER — Other Ambulatory Visit (HOSPITAL_COMMUNITY)
Admission: RE | Admit: 2011-11-03 | Discharge: 2011-11-03 | Disposition: A | Discharge status: Home / Self Care | Payer: Medicare Other | Source: Ambulatory Visit | Attending: Internal Medicine | Admitting: Internal Medicine

## 2011-11-03 ENCOUNTER — Ambulatory Visit (INDEPENDENT_AMBULATORY_CARE_PROVIDER_SITE_OTHER): Payer: Medicare Other | Admitting: Internal Medicine

## 2011-11-03 ENCOUNTER — Other Ambulatory Visit: Payer: Self-pay | Admitting: Internal Medicine

## 2011-11-03 VITALS — BP 130/70 | HR 75 | Temp 98.5°F | Ht 64.0 in | Wt 165.0 lb

## 2011-11-03 DIAGNOSIS — Z0000 Encounter for general adult medical examination without abnormal findings: Secondary | ICD-10-CM

## 2011-11-03 DIAGNOSIS — Z124 Encounter for screening for malignant neoplasm of cervix: Secondary | ICD-10-CM | POA: Diagnosis not present

## 2011-11-03 DIAGNOSIS — F411 Generalized anxiety disorder: Secondary | ICD-10-CM

## 2011-11-03 DIAGNOSIS — E041 Nontoxic single thyroid nodule: Secondary | ICD-10-CM

## 2011-11-03 DIAGNOSIS — Z23 Encounter for immunization: Secondary | ICD-10-CM

## 2011-11-03 DIAGNOSIS — I10 Essential (primary) hypertension: Secondary | ICD-10-CM

## 2011-11-03 DIAGNOSIS — M1990 Unspecified osteoarthritis, unspecified site: Secondary | ICD-10-CM

## 2011-11-03 DIAGNOSIS — R011 Cardiac murmur, unspecified: Secondary | ICD-10-CM

## 2011-11-03 DIAGNOSIS — N760 Acute vaginitis: Secondary | ICD-10-CM | POA: Diagnosis not present

## 2011-11-03 MED ORDER — CYCLOBENZAPRINE HCL 10 MG PO TABS: 10.0000 mg | ORAL_TABLET | Freq: Every evening | ORAL | Status: AC | PRN

## 2011-11-03 NOTE — Assessment & Plan Note (Signed)
Mils syst murmur not previously noted, asx: check a ECHO

## 2011-11-03 NOTE — Assessment & Plan Note (Signed)
Patient reports her nodule may be a little bigger: get a u/s

## 2011-11-03 NOTE — Patient Instructions (Addendum)
Use tylenol 500 mg 1 or 2 every 8 hours as needed for pain Stop omeprazole and naproxen Came back fasting: FLP BMP--- dx HTN TSH-- dx thyroid nodule

## 2011-11-03 NOTE — Assessment & Plan Note (Signed)
Well controlled 

## 2011-11-03 NOTE — Assessment & Plan Note (Addendum)
Td 03 and today pneumonia shot 09 Shingles shot--had in 2008  multiple neg MMG, last 8-12 , breast  exam negative today last PAP 2009,  s/p Hysterectomy for bening reasons, but h/o abnormal PAP x 1, s/p LEEP?----> on PAP q 3 years. PAP sent today last Colonoscopy:  04/08/2003  (Results:  Polyps)--------------->  Since the last time we discussed her FH, her father has developed colon ca thus will refer to GI  DEXA 2006 and 04-2010 --normal  encouraged to continue with her healthy diet and exercise daily

## 2011-11-03 NOTE — Assessment & Plan Note (Signed)
C/o easy bruising, on ASA naproxen; on PPI for GI protection Rec: D/C naproxen, PPI Tylenol instead  Also flexeril qhs helped , will RF, ok to take long term for now

## 2011-11-03 NOTE — Progress Notes (Signed)
  Subjective:    Patient ID: [name], female    DOB: 18-Oct-1940, 71 y.o.   MRN: [id number]  HPI Here for Medicare AWV: 1.Risk factors based on Past M, S, F history: done , see below  2.Physical Activities: active,  walks 1/2 to 1 mile qod  3.Depression/mood:  Denies depression, lorazepam as needed  4.Hearing: normal to whispered voice at 6 feet, no reported problems  5.ADL's: totally independent  6.Fall Risk: no recent falls, counseled  7.Home Safety: does feel safe at home  8.Height, weight, &visual acuity: see VS, vision corrected w/ glasses  9.Counseling:see a/p  10.Labs ordered based on risk factors: yes  11. Referral Coordination--if needed  12. Care Plan---- see a/p 13.  Cognitive Assessment: AOx3, alertness and motor skills normal   In addition we discussed the following DJD, taking naproxen once a day and also aspirin, has noted excessive bruising. Wonders what she should do. She also benefited a lot from Flexeril qhs, would like a prescription. Hypertension, good medication compliance, normal ambulatory BPs. Thyroid nodule, self-examination according to the patient shows a slight increase in size.   Past Medical History:  Hypertension (09/11/2005)  elevated homocysteine level  Anxiety  thyroid goiter/nodule -- rt benign colloid nodule -- left hyperplastic nodule - Thyroid bx 2006: neg  OA- shoulder pain, knee pain after TKR L, Hx of BAKER'S CYST  Goiter  Hx adenomatous colon polyps 03/2003  Past Surgical History:  Hysterectomy and oophorectomy  L knee arthroscopy, then replacement (left) 2007 Dr Earlie Lou, then a "manipulation, then redo TKR 11-09 w/ Dr Despina Hick  Hip replacement R (3-11)  facelift  incisional hernia repair  knee surgery x 5  L shoulder surgery 06-2009  R shoulder replacement, 7-12  Social History:  Married, 2 children  Occupation: Retired Runner, broadcasting/film/video  Tobacco--Stopped 1985  Alcohol Use - seldom, wine  Daily Caffeine Use-1 cup daily  Illicit Drug  Use - no    Family History: breast ca--no Prostate ca-- F Colon Polyps-- Brother, Father Colon ca--F CAD-- F CABG at age 34s, Brother  Clotting disorder: Father, brother      Review of Systems No chest pain or shortness or breath. No abdominal pain, nausea, vomiting, diarrhea blood in the stools. Specifically no epigastric pain.     Objective:   Physical Exam  General:  alert, well-developed, and well-nourished.   Neck:  full ROM and no masses.  right thyroid nodule, nontender, measures about 1.9 cm Breasts:  No mass, nodules, thickening, tenderness, bulging, retraction, inflamation, nipple discharge or skin changes noted.  no axillary lymph nodes Lungs:  normal respiratory effort, no intercostal retractions, no accessory muscle use, and normal breath sounds.   Heart:  normal rate, regular rhythm, has a soft systolic murmur, a new finding ; no gallop.   Abdomen:  soft, non-tender, and no distention.   Extremities:  no lower extremity edema Neurologic:  alert & oriented X3 and strength normal in all extremities. GU: External genitalia normal, vagina slightly dry, no discharge or lesions. Mild bleeding with brushing. Bimanual exam limited due to patient dryness but normal. DRE: brown stools, Hemoccult negative   Psych:  Cognition and judgment appear intact. Alert and cooperative with normal attention span and concentration.  not anxious appearing and not depressed appearing.       Assessment & Plan:

## 2011-11-07 ENCOUNTER — Other Ambulatory Visit (INDEPENDENT_AMBULATORY_CARE_PROVIDER_SITE_OTHER): Payer: Medicare Other

## 2011-11-07 DIAGNOSIS — I10 Essential (primary) hypertension: Secondary | ICD-10-CM | POA: Diagnosis not present

## 2011-11-07 DIAGNOSIS — E041 Nontoxic single thyroid nodule: Secondary | ICD-10-CM

## 2011-11-07 LAB — BASIC METABOLIC PANEL WITH GFR
BUN: 26 mg/dL — ABNORMAL HIGH (ref 6–23)
CO2: 28 meq/L (ref 19–32)
Calcium: 9.8 mg/dL (ref 8.4–10.5)
Chloride: 98 meq/L (ref 96–112)
Creatinine, Ser: 0.9 mg/dL (ref 0.4–1.2)
GFR: 63.18 mL/min
Glucose, Bld: 88 mg/dL (ref 70–99)
Potassium: 4.2 meq/L (ref 3.5–5.1)
Sodium: 134 meq/L — ABNORMAL LOW (ref 135–145)

## 2011-11-07 LAB — LIPID PANEL
Cholesterol: 263 mg/dL — ABNORMAL HIGH (ref 0–200)
HDL: 102.6 mg/dL
Total CHOL/HDL Ratio: 3
Triglycerides: 49 mg/dL (ref 0.0–149.0)
VLDL: 9.8 mg/dL (ref 0.0–40.0)

## 2011-11-07 LAB — LDL CHOLESTEROL, DIRECT: Direct LDL: 143.5 mg/dL

## 2011-11-08 ENCOUNTER — Ambulatory Visit
Admission: RE | Admit: 2011-11-08 | Discharge: 2011-11-08 | Disposition: A | Discharge status: Home / Self Care | Payer: Medicare Other | Source: Ambulatory Visit | Attending: Internal Medicine | Admitting: Internal Medicine

## 2011-11-08 DIAGNOSIS — E042 Nontoxic multinodular goiter: Secondary | ICD-10-CM | POA: Diagnosis not present

## 2011-11-08 DIAGNOSIS — E041 Nontoxic single thyroid nodule: Secondary | ICD-10-CM

## 2011-11-09 ENCOUNTER — Ambulatory Visit (HOSPITAL_COMMUNITY): Payer: Medicare Other

## 2011-11-09 HISTORY — PX: BACK SURGERY: SHX140

## 2011-11-13 ENCOUNTER — Encounter: Payer: Self-pay | Admitting: *Deleted

## 2011-11-14 MED ORDER — FLUCONAZOLE 150 MG PO TABS: 150.0000 mg | ORAL_TABLET | Freq: Once | ORAL | Status: AC

## 2011-11-14 NOTE — Progress Notes (Signed)
Addended by: Edwena Felty T on: 11/14/2011 10:58 AM   Modules accepted: Orders

## 2011-11-16 ENCOUNTER — Other Ambulatory Visit (HOSPITAL_COMMUNITY): Payer: Self-pay | Admitting: Radiology

## 2011-11-16 ENCOUNTER — Ambulatory Visit (HOSPITAL_COMMUNITY): Payer: Medicare Other | Attending: Cardiology

## 2011-11-16 ENCOUNTER — Other Ambulatory Visit: Payer: Self-pay

## 2011-11-16 ENCOUNTER — Other Ambulatory Visit: Payer: Self-pay | Admitting: Internal Medicine

## 2011-11-16 DIAGNOSIS — I10 Essential (primary) hypertension: Secondary | ICD-10-CM | POA: Insufficient documentation

## 2011-11-16 DIAGNOSIS — R011 Cardiac murmur, unspecified: Secondary | ICD-10-CM

## 2011-11-16 NOTE — Telephone Encounter (Signed)
Refill request lorazepam 0.5mg  #60 with 3 refills. Last refilled on 7.25.12. OK to refill?

## 2011-11-16 NOTE — Telephone Encounter (Signed)
Ok 60, 4 RF 

## 2011-11-16 NOTE — Telephone Encounter (Signed)
Refill done.  

## 2012-01-12 ENCOUNTER — Other Ambulatory Visit: Payer: Self-pay | Admitting: Internal Medicine

## 2012-01-12 NOTE — Telephone Encounter (Signed)
Refill done.  

## 2012-01-24 DIAGNOSIS — M5137 Other intervertebral disc degeneration, lumbosacral region: Secondary | ICD-10-CM | POA: Diagnosis not present

## 2012-01-24 DIAGNOSIS — G894 Chronic pain syndrome: Secondary | ICD-10-CM | POA: Diagnosis not present

## 2012-02-06 DIAGNOSIS — M5137 Other intervertebral disc degeneration, lumbosacral region: Secondary | ICD-10-CM | POA: Diagnosis not present

## 2012-02-16 DIAGNOSIS — IMO0002 Reserved for concepts with insufficient information to code with codable children: Secondary | ICD-10-CM | POA: Diagnosis not present

## 2012-02-16 DIAGNOSIS — M1710 Unilateral primary osteoarthritis, unspecified knee: Secondary | ICD-10-CM | POA: Diagnosis not present

## 2012-02-20 DIAGNOSIS — M545 Low back pain, unspecified: Secondary | ICD-10-CM | POA: Diagnosis not present

## 2012-03-01 ENCOUNTER — Ambulatory Visit: Payer: Medicare Other | Admitting: Internal Medicine

## 2012-03-04 ENCOUNTER — Ambulatory Visit (INDEPENDENT_AMBULATORY_CARE_PROVIDER_SITE_OTHER): Payer: Medicare Other | Admitting: Internal Medicine

## 2012-03-04 VITALS — BP 122/84 | HR 57 | Temp 98.1°F | Wt 171.0 lb

## 2012-03-04 DIAGNOSIS — I10 Essential (primary) hypertension: Secondary | ICD-10-CM | POA: Diagnosis not present

## 2012-03-04 DIAGNOSIS — Z01818 Encounter for other preprocedural examination: Secondary | ICD-10-CM | POA: Diagnosis not present

## 2012-03-04 DIAGNOSIS — F411 Generalized anxiety disorder: Secondary | ICD-10-CM

## 2012-03-04 DIAGNOSIS — M1990 Unspecified osteoarthritis, unspecified site: Secondary | ICD-10-CM

## 2012-03-04 MED ORDER — CELECOXIB 100 MG PO CAPS: 100.0000 mg | ORAL_CAPSULE | Freq: Two times a day (BID) | ORAL | Status: AC | PRN

## 2012-03-04 MED ORDER — LORAZEPAM 0.5 MG PO TABS: 1.0000 mg | ORAL_TABLET | Freq: Every evening | ORAL | Status: DC | PRN

## 2012-03-04 NOTE — Assessment & Plan Note (Signed)
Seems well controlled, check a BMP 

## 2012-03-04 NOTE — Patient Instructions (Addendum)
Please get your x-ray at the other [place]  office located at: [address], across from [facility].  Please go to the basement, this is a walk-in facility, they are open from 8:30 to 5:30 PM. Phone number [phone].  

## 2012-03-04 NOTE — Assessment & Plan Note (Addendum)
71 year old very active female with no h/o heart disease here for surgical clearance. She has tolerated a number of major surgeries in the last few years without any problems. Question of murmur few months ago, no murmur today. Echocardiogram 11/2011 normal. EKG today no acute changes  Chest x-ray 03-2011 negative except for some emphysema changes. Patient is asymptomatic from the respiratory standpoint. Repeat chest x-ray. Will clear with results. Addendum: Labs and XR normal, pt cleared for surgery JP 03-08-2012

## 2012-03-04 NOTE — Assessment & Plan Note (Signed)
Having some difficulty sleeping mostly due to pain, increase Ativan to 2 tablets at night

## 2012-03-04 NOTE — Assessment & Plan Note (Addendum)
Severe DJD. See history of present illness, would like to try Celebrex. I think that is a good option. Celebrex 100 mg twice a day when necessary. Patient is quite frustrated with her pain, she becomes very emotional, she is counseled.

## 2012-03-04 NOTE — Progress Notes (Signed)
  Subjective:    Patient ID: [name], female    DOB: Feb 22, 1941, 71 y.o.   MRN: [id number]  HPI Preop clearance Patient with severe DJD needs a total right knee replacement scheduled for September 2013. Besides the pain she feels very well physically . She remains very active, she is able to walk half a mile without problems and then she needs to stop mostly due to to back pain. No chest pain, dyspnea on exertion. She is quite bothered by the knee pain, in the past I recommended d/c naproxen because she was experiencing abdominal pain, she try Tylenol and that didn't work. Currently taking only one naproxen  which decreased her pain to some extent. Wonders about Celebrex. Also has a history of mild anxiety and insomnia, because the pain she can sleep well, would like to increase Ativan to 2 tablets at night.   Past Medical History:   Hypertension (09/11/2005)   elevated homocysteine level   Anxiety   thyroid goiter/nodule -- rt benign colloid nodule -- left hyperplastic nodule - Thyroid bx 2006: neg   OA- shoulder pain, knee pain after TKR L, Hx of BAKER'S CYST   Goiter   Hx adenomatous colon polyps 03/2003   Past Surgical History:   Hysterectomy and oophorectomy   L knee arthroscopy, then replacement (left) 2007 Dr Earlie Lou, then a "manipulation, then redo TKR 11-09 w/ Dr Despina Hick   Hip replacement R (3-11)   facelift   incisional hernia repair   knee surgery x 5   L shoulder surgery 06-2009   R shoulder replacement, 7-12   Social History:   Married, 2 children   Occupation: Retired Runner, broadcasting/film/video   Tobacco--Stopped 1985   Alcohol Use - seldom, wine   Daily Caffeine Use-1 cup daily   Illicit Drug Use - no     Family History: breast ca--no Prostate ca-- F Colon Polyps-- Brother, Father Colon ca--F CAD-- F CABG at age 52s, Brother  Clotting disorder: Father, brother    Review of Systems Denies cough, wheezing. No snoring or fatigue. No nausea, vomiting, diarrhea or change in  the color of the stools.    Objective:   Physical Exam  General -- alert, well-developed. No apparent distress.  Neck --no JVD at 45 Lungs -- normal respiratory effort, no intercostal retractions, no accessory muscle use, and normal breath sounds.   Heart-- normal rate, regular rhythm, no murmur, and no gallop.     Extremities-- no pretibial edema bilaterally  Neurologic-- alert & oriented X3 and strength normal in all extremities. Psych-- Cognition and judgment appear intact. Alert and cooperative with normal attention span and concentration.  not anxious appearing and not depressed appearing.      Assessment & Plan:

## 2012-03-05 ENCOUNTER — Encounter: Payer: Self-pay | Admitting: Internal Medicine

## 2012-03-05 LAB — CBC WITH DIFFERENTIAL/PLATELET
Basophils Absolute: 0 10*3/uL (ref 0.0–0.1)
Basophils Relative: 0.3 % (ref 0.0–3.0)
Eosinophils Absolute: 0.3 10*3/uL (ref 0.0–0.7)
Eosinophils Relative: 3.7 % (ref 0.0–5.0)
HCT: 39.1 % (ref 36.0–46.0)
Hemoglobin: 12.9 g/dL (ref 12.0–15.0)
Lymphocytes Relative: 30.1 % (ref 12.0–46.0)
Lymphs Abs: 2.7 10*3/uL (ref 0.7–4.0)
MCHC: 32.9 g/dL (ref 30.0–36.0)
MCV: 93.5 fl (ref 78.0–100.0)
Monocytes Absolute: 0.6 10*3/uL (ref 0.1–1.0)
Monocytes Relative: 6.4 % (ref 3.0–12.0)
Neutro Abs: 5.2 10*3/uL (ref 1.4–7.7)
Neutrophils Relative %: 59.5 % (ref 43.0–77.0)
Platelets: 265 10*3/uL (ref 150.0–400.0)
RBC: 4.18 Mil/uL (ref 3.87–5.11)
RDW: 14.4 % (ref 11.5–14.6)
WBC: 8.8 10*3/uL (ref 4.5–10.5)

## 2012-03-05 LAB — BASIC METABOLIC PANEL
BUN: 19 mg/dL (ref 6–23)
CO2: 28 mEq/L (ref 19–32)
Calcium: 9.5 mg/dL (ref 8.4–10.5)
Chloride: 98 mEq/L (ref 96–112)
Creatinine, Ser: 1 mg/dL (ref 0.4–1.2)
GFR: 58.05 mL/min — ABNORMAL LOW (ref >60.00)
Glucose, Bld: 85 mg/dL (ref 70–99)
Potassium: 4 mEq/L (ref 3.5–5.1)
Sodium: 136 mEq/L (ref 135–145)

## 2012-03-07 ENCOUNTER — Ambulatory Visit (INDEPENDENT_AMBULATORY_CARE_PROVIDER_SITE_OTHER)
Admission: RE | Admit: 2012-03-07 | Discharge: 2012-03-07 | Disposition: A | Discharge status: Home / Self Care | Payer: Medicare Other | Source: Ambulatory Visit | Attending: Internal Medicine | Admitting: Internal Medicine

## 2012-03-07 DIAGNOSIS — Z01811 Encounter for preprocedural respiratory examination: Secondary | ICD-10-CM | POA: Diagnosis not present

## 2012-03-07 DIAGNOSIS — Z01818 Encounter for other preprocedural examination: Secondary | ICD-10-CM

## 2012-03-07 DIAGNOSIS — J984 Other disorders of lung: Secondary | ICD-10-CM | POA: Diagnosis not present

## 2012-03-18 DIAGNOSIS — M5137 Other intervertebral disc degeneration, lumbosacral region: Secondary | ICD-10-CM | POA: Diagnosis not present

## 2012-03-18 DIAGNOSIS — M999 Biomechanical lesion, unspecified: Secondary | ICD-10-CM | POA: Diagnosis not present

## 2012-03-19 DIAGNOSIS — M5137 Other intervertebral disc degeneration, lumbosacral region: Secondary | ICD-10-CM | POA: Diagnosis not present

## 2012-03-19 DIAGNOSIS — M999 Biomechanical lesion, unspecified: Secondary | ICD-10-CM | POA: Diagnosis not present

## 2012-03-23 DIAGNOSIS — M5137 Other intervertebral disc degeneration, lumbosacral region: Secondary | ICD-10-CM | POA: Diagnosis not present

## 2012-03-23 DIAGNOSIS — M999 Biomechanical lesion, unspecified: Secondary | ICD-10-CM | POA: Diagnosis not present

## 2012-04-02 DIAGNOSIS — M545 Low back pain, unspecified: Secondary | ICD-10-CM | POA: Diagnosis not present

## 2012-04-05 DIAGNOSIS — M5137 Other intervertebral disc degeneration, lumbosacral region: Secondary | ICD-10-CM | POA: Diagnosis not present

## 2012-04-11 ENCOUNTER — Other Ambulatory Visit: Payer: Self-pay | Admitting: Internal Medicine

## 2012-04-11 NOTE — Telephone Encounter (Signed)
Not on her medication list but has taken this medicine before. ok 90, no refills

## 2012-04-11 NOTE — Telephone Encounter (Signed)
Refill done.  

## 2012-05-06 ENCOUNTER — Other Ambulatory Visit: Payer: Self-pay | Admitting: Orthopedic Surgery

## 2012-05-06 ENCOUNTER — Encounter (HOSPITAL_COMMUNITY): Payer: Self-pay | Admitting: Pharmacy Technician

## 2012-05-06 MED ORDER — BUPIVACAINE 0.25 % ON-Q PUMP SINGLE CATH 300ML: 300.0000 mL | INJECTION | Status: DC

## 2012-05-06 MED ORDER — DEXAMETHASONE SODIUM PHOSPHATE 10 MG/ML IJ SOLN: 10.0000 mg | Freq: Once | INTRAMUSCULAR | Status: DC

## 2012-05-06 NOTE — Progress Notes (Signed)
Preoperative surgical orders have been place into the Epic hospital system for [facility] on 05/06/2012, 8:42 AM  by Patrica Duel for surgery on 05/20/2012.  Preop Total Knee orders including Bupivacaine On-Q pump, IV Tylenol, and IV Decadron as long as there are no contraindications to the above medications. Avel Peace, PA-C

## 2012-05-07 DIAGNOSIS — M5137 Other intervertebral disc degeneration, lumbosacral region: Secondary | ICD-10-CM | POA: Diagnosis not present

## 2012-05-14 ENCOUNTER — Encounter (HOSPITAL_COMMUNITY)
Admission: RE | Admit: 2012-05-14 | Discharge: 2012-05-14 | Disposition: A | Discharge status: Home / Self Care | Payer: Medicare Other | Source: Ambulatory Visit | Attending: Orthopedic Surgery | Admitting: Orthopedic Surgery

## 2012-05-14 ENCOUNTER — Telehealth: Payer: Self-pay | Admitting: Internal Medicine

## 2012-05-14 ENCOUNTER — Encounter (HOSPITAL_COMMUNITY): Payer: Self-pay

## 2012-05-14 HISTORY — DX: Other specified postprocedural states: Z98.890

## 2012-05-14 HISTORY — DX: Other specified postprocedural states: R11.2

## 2012-05-14 LAB — CBC
HCT: 42.1 % (ref 36.0–46.0)
Hemoglobin: 14.4 g/dL (ref 12.0–15.0)
MCH: 31.6 pg (ref 26.0–34.0)
MCHC: 34.2 g/dL (ref 30.0–36.0)
MCV: 92.3 fL (ref 78.0–100.0)
Platelets: 301 10*3/uL (ref 150–400)
RBC: 4.56 MIL/uL (ref 3.87–5.11)
RDW: 13.4 % (ref 11.5–15.5)
WBC: 7.1 10*3/uL (ref 4.0–10.5)

## 2012-05-14 LAB — URINALYSIS, ROUTINE W REFLEX MICROSCOPIC
Bilirubin Urine: NEGATIVE
Glucose, UA: NEGATIVE mg/dL
Hgb urine dipstick: NEGATIVE
Ketones, ur: NEGATIVE mg/dL
Leukocytes, UA: NEGATIVE
Nitrite: NEGATIVE
Protein, ur: NEGATIVE mg/dL
Specific Gravity, Urine: 1.008 (ref 1.005–1.030)
Urobilinogen, UA: 0.2 mg/dL (ref 0.0–1.0)
pH: 7.5 (ref 5.0–8.0)

## 2012-05-14 LAB — COMPREHENSIVE METABOLIC PANEL
ALT: 13 U/L (ref 0–35)
AST: 25 U/L (ref 0–37)
Albumin: 4.2 g/dL (ref 3.5–5.2)
Alkaline Phosphatase: 76 U/L (ref 39–117)
BUN: 14 mg/dL (ref 6–23)
CO2: 30 mEq/L (ref 19–32)
Calcium: 10 mg/dL (ref 8.4–10.5)
Chloride: 99 mEq/L (ref 96–112)
Creatinine, Ser: 0.86 mg/dL (ref 0.50–1.10)
GFR calc Af Amer: 77 mL/min — ABNORMAL LOW (ref >90)
GFR calc non Af Amer: 66 mL/min — ABNORMAL LOW (ref >90)
Glucose, Bld: 91 mg/dL (ref 70–99)
Potassium: 4.6 mEq/L (ref 3.5–5.1)
Sodium: 137 mEq/L (ref 135–145)
Total Bilirubin: 0.4 mg/dL (ref 0.3–1.2)
Total Protein: 6.5 g/dL (ref 6.0–8.3)

## 2012-05-14 LAB — PROTIME-INR
INR: 0.97 (ref 0.00–1.49)
Prothrombin Time: 13.1 seconds (ref 11.6–15.2)

## 2012-05-14 LAB — SURGICAL PCR SCREEN
MRSA, PCR: NEGATIVE
Staphylococcus aureus: NEGATIVE

## 2012-05-14 LAB — APTT: aPTT: 28 seconds (ref 24–37)

## 2012-05-14 NOTE — Telephone Encounter (Signed)
Caller: [name]/Patient; Patient Name: [name]; PCP: [name]; Best Callback Phone Number: [phone]; Reason for call: Urinary Pain/Bleeding.  States is having surgery 05/20/12 and will be on antibiotic.  Currently has no symptoms.  States she has had vagiinal irritation/infection in the past after antibiotic, and Dr. Drue Novel recommended she have Diflucan or vaginal suppositories on standby for when she has to take an antibiotic again in the future.  Is requesting Rx for antifungal for when she takes antibiotics next week.  Prefers to use vaginal suppositories.  Info to office for provider review/Rx/callback.   Uses [facility]/[address] [phone]. May reach patient at [phone].

## 2012-05-14 NOTE — Pre-Procedure Instructions (Signed)
Chest x ray, ekg  With LOV Dr Drue Novel 6/13 EPIC  Clearance  Dr Drue Novel on chart,  [facility] 6/13 EP[facility]

## 2012-05-14 NOTE — Patient Instructions (Addendum)
20 AMARRIA ANDREASEN  05/14/2012   Your procedure is scheduled on:  05/20/12   Monday  Surgery  [id number]  Report to [facility] at 1005      AM.  Call this number if you have problems the morning of surgery: [phone]     Or PST   [id number]  [facility]   Remember:   Do not eat food or drink any fluids :After Midnight.SUNDAY NIGHT    Take these medicines the morning of surgery with A SIP OF WATER: NONE    Except Norco or Zyrtec if needed   Do not wear jewelry, make-up or nail polish.  Do not wear lotions, powders, or perfumes. You may wear deodorant.  Do not shave 48 hours prior to surgery.  Do not bring valuables to the hospital.  Contacts, dentures or bridgework may not be worn into surgery.  Leave suitcase in the car. After surgery it may be brought to your room.  For patients admitted to the hospital, checkout time is 11:00 AM the day of discharge.   Patients discharged the day of surgery will not be allowed to drive home.  Name and phone number of your driver:       husband                                                               Special Instructions: CHG Shower Use Special Wash: 1/2 bottle night before surgery and 1/2 bottle morning of surgery. REGULAR SOAP FACE AND PRIVATES              LADIES- NO SHAVING 48 HOURS BEFORE USING BETASEPT SOAP.                   Please read over the following fact sheets that you were given: MRSA Information

## 2012-05-14 NOTE — Pre-Procedure Instructions (Signed)
States area on buttocks that she applies hdyrocortisone cream to is not open or bothering her today

## 2012-05-15 MED ORDER — BUTOCONAZOLE NITRATE (1 DOSE) 2 % VA CREA: TOPICAL_CREAM | VAGINAL | Status: DC

## 2012-05-15 NOTE — Telephone Encounter (Signed)
Discussed with pt

## 2012-05-15 NOTE — Telephone Encounter (Signed)
Advise pt I sent a Rx just in case, to use x 3 days prn (ok to use up to 6 days if needed

## 2012-05-19 ENCOUNTER — Other Ambulatory Visit: Payer: Self-pay | Admitting: Orthopedic Surgery

## 2012-05-19 NOTE — H&P (Signed)
[name]  DOB: 01/02/1941 Married / Language: English / Race: White Female  Date Of Admission:  05/20/2012  Chief complaint:  Right Knee Pain  History of Present Illness The patient is a 71 year old female who comes in for a preoperative History and Physical. The patient is scheduled for a right total knee arthroplasty to be performed by Dr. Frank V. Aluisio, MD at [facility] on 05/20/2012. The patient is a 71 year old female who presents for a recheck of Follow-up Knee. The patient is being followed for their right knee pain and osteoarthritis. Symptoms reported today include: pain. The patient feels that they are doing poorly and report their pain level to be moderate to severe. Current treatment includes: pain medications (Hydrocodone for extreme pain). The following medication has been used for pain control: none. The patient has not gotten any relief of their symptoms with Cortisone injections (last injection in February). Note for "Follow-up Knee": She would like to go ahead and get scheduled for total knee arthroplasty. [name] comes in today for recheck of her right knee. The cortisone shot has not helped. The Capzasin cream has not helped. She continues to have pain and difficulty in her right knee and is ready to go ahead and get that scheduled for total knee arthroplasty. They have been treated conservatively in the past for the above stated problem and despite conservative measures, they continue to have progressive pain and severe functional limitations and dysfunction. They have failed non-operative management including home exercise, medications, and injections. It is felt that they would benefit from undergoing total joint replacement. Risks and benefits of the procedure have been discussed with the patient and they elect to proceed with surgery. There are no active contraindications to surgery such as ongoing infection or rapidly progressive neurological  disease.   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 (Thyroid) Colonic Polyps. Adenomatous High blood pressure Osteoarthritis   Family History Cerebrovascular Accident. grandfather mothers side and grandfather fathers side Heart Disease. father Hypertension. mother, father and brother Rheumatoid Arthritis. mother   Social History Alcohol use. current drinker; drinks wine; less than 5 per week Children. 2 Current work status. retired Drug/Alcohol Rehab (Currently). no Drug/Alcohol Rehab (Previously). no Exercise. Exercises daily; does running / walking and gym / weights Illicit drug use. no Living situation. live with spouse Marital status. married Number of flights of stairs before winded. 2-3 Pain Contract. no Tobacco / smoke exposure. no Tobacco use. Former smoker. quit in 1985 former smoker; smoke(d) less than 1/2 pack(s) per day   Medication History Norco (10-325MG Tablet, 1 Tablet Oral four times daily, as needed, Taken starting 02/20/2012) Active. Lisinopril ( Oral) Specific dose unknown - Active. Omeprazole ( Oral) Specific dose unknown - Active. Ativan ( Oral) Specific dose unknown - Active. Sudafed ( Oral) Specific dose unknown - Active. ZyrTEC-D Allergy & Congestion ( Oral) Specific dose unknown - Active. Aspirin Childrens (81MG Tablet Chewable, Oral) Active.   Pregnancy / Birth History Pregnant. no   Past Surgical History Arthroscopy of Knee. right Colon Polyp Removal - Colonoscopy Colon Polyp Removal - Open Hip Fracture and Surgery. right Hysterectomy. complete (non-cancerous) Rotator Cuff Repair. bilateral Total Hip Replacement. right Total Knee Replacement. left   Review of Systems General:Not Present- Chills, Fever, Night Sweats, Appetite Loss, Fatigue, Feeling sick, Weight Gain and Weight Loss. Skin:Not Present- Itching, Rash, Skin Color Changes, Ulcer, Psoriasis  and Change in Hair or Nails. HEENT:Not Present- Sensitivity to light, Hearing problems,   Nose Bleed and Ringing in the Ears. Neck:Not Present- Swollen Glands and Neck Mass. Respiratory:Not Present- Snoring, Chronic Cough, Bloody sputum and Dyspnea. Cardiovascular:Present- Leg Cramps. Not Present- Shortness of Breath, Chest Pain, Swelling of Extremities and Palpitations. Gastrointestinal:Not Present- Bloody Stool, Heartburn, Abdominal Pain, Vomiting, Nausea and Incontinence of Stool. Female Genitourinary:Present- Frequency. Not Present- Blood in Urine, Menstrual Irregularities, Incontinence and Nocturia. Musculoskeletal:Present- Muscle Pain, Joint Stiffness, Joint Swelling, Joint Pain and Back Pain. Not Present- Muscle Weakness. Neurological:Not Present- Tingling, Numbness, Burning, Tremor, Headaches and Dizziness. Psychiatric:Not Present- Anxiety, Depression and Memory Loss. Endocrine:Not Present- Cold Intolerance, Heat Intolerance, Excessive hunger and Excessive Thirst. Hematology:Present- Easy Bruising. Not Present- Abnormal Bleeding, Anemia and Blood Clots.   Vitals Pulse: 64 (Regular) Resp.: 12 (Unlabored) BP: 124/72 (Sitting, Left Arm, Standard)    Physical Exam(DREW L Jordin Vicencio, PA-C; 05/07/2012 3:59 PM) The physical exam findings are as follows: Patient is a 71 year old female with conitnued knee pain.   General Mental Status - Alert, cooperative and good historian. General Appearance- pleasant. Not in acute distress. Orientation- Oriented X3. Build & Nutrition- Well nourished and Well developed.   Head and Neck Head- normocephalic, atraumatic . Neck Global Assessment- supple. no bruit auscultated on the right and no bruit auscultated on the left.   Eye Pupil- Bilateral- Regular and Round. Motion- Bilateral- EOMI.   Chest and Lung Exam Auscultation: Breath sounds:- clear at anterior chest wall and - clear at posterior chest  wall. Adventitious sounds:- No Adventitious sounds.   Cardiovascular Auscultation:Rhythm- Regular rate and rhythm. Heart Sounds- S1 WNL and S2 WNL. Murmurs & Other Heart Sounds:Auscultation of the heart reveals - No Murmurs.   Abdomen Palpation/Percussion:Tenderness- Abdomen is non-tender to palpation. Rigidity (guarding)- Abdomen is soft. Auscultation:Auscultation of the abdomen reveals - Bowel sounds normal.   Female Genitourinary Not done, not pertinent to present illness  Musculoskeletal Today she has crepitation throughout the range of motion. Moderate soft tissue swelling. Valgus deformity. Sensation and circulation are intact. She has 3 degrees from full extension with further flexion to 130 degrees.  RADIOGRAPHS: X rays show bone on bone lateral compartment, moderate medial and patellofemoral changes.  Assessment & Plan Osteoarthrosis NOS, lower leg (715.96) Impression: Right Knee  Note: Patient is for a Right Total Knee Replacement by Dr. Aluisio.  PCP - Dr. Paz  Signed electronically by DREW L Tylyn Stankovich, PA-C  

## 2012-05-20 ENCOUNTER — Inpatient Hospital Stay (HOSPITAL_COMMUNITY): Payer: Medicare Other | Admitting: Anesthesiology

## 2012-05-20 ENCOUNTER — Inpatient Hospital Stay (HOSPITAL_COMMUNITY)
Admission: RE | Admit: 2012-05-20 | Discharge: 2012-05-23 | DRG: 470 | Disposition: A | Discharge status: Skilled Nursing Facility | Payer: Medicare Other | Source: Ambulatory Visit | Attending: Orthopedic Surgery | Admitting: Orthopedic Surgery

## 2012-05-20 ENCOUNTER — Encounter (HOSPITAL_COMMUNITY)
Admission: RE | Disposition: A | Discharge status: Skilled Nursing Facility | Payer: Self-pay | Source: Ambulatory Visit | Attending: Orthopedic Surgery

## 2012-05-20 ENCOUNTER — Encounter (HOSPITAL_COMMUNITY): Payer: Self-pay | Admitting: Anesthesiology

## 2012-05-20 ENCOUNTER — Encounter (HOSPITAL_COMMUNITY): Payer: Self-pay | Admitting: *Deleted

## 2012-05-20 DIAGNOSIS — E871 Hypo-osmolality and hyponatremia: Secondary | ICD-10-CM | POA: Diagnosis not present

## 2012-05-20 DIAGNOSIS — D62 Acute posthemorrhagic anemia: Secondary | ICD-10-CM | POA: Diagnosis not present

## 2012-05-20 DIAGNOSIS — I10 Essential (primary) hypertension: Secondary | ICD-10-CM | POA: Diagnosis present

## 2012-05-20 DIAGNOSIS — Z96649 Presence of unspecified artificial hip joint: Secondary | ICD-10-CM | POA: Diagnosis not present

## 2012-05-20 DIAGNOSIS — IMO0002 Reserved for concepts with insufficient information to code with codable children: Secondary | ICD-10-CM | POA: Diagnosis not present

## 2012-05-20 DIAGNOSIS — M1710 Unilateral primary osteoarthritis, unspecified knee: Secondary | ICD-10-CM | POA: Diagnosis not present

## 2012-05-20 DIAGNOSIS — S8990XA Unspecified injury of unspecified lower leg, initial encounter: Secondary | ICD-10-CM | POA: Diagnosis not present

## 2012-05-20 DIAGNOSIS — Z90710 Acquired absence of both cervix and uterus: Secondary | ICD-10-CM | POA: Diagnosis not present

## 2012-05-20 DIAGNOSIS — Z96659 Presence of unspecified artificial knee joint: Secondary | ICD-10-CM

## 2012-05-20 DIAGNOSIS — E7210 Disorders of sulfur-bearing amino-acid metabolism, unspecified: Secondary | ICD-10-CM | POA: Diagnosis present

## 2012-05-20 DIAGNOSIS — K5900 Constipation, unspecified: Secondary | ICD-10-CM | POA: Diagnosis not present

## 2012-05-20 DIAGNOSIS — Z860100 Personal history of colon polyps, unspecified: Secondary | ICD-10-CM

## 2012-05-20 DIAGNOSIS — Z87891 Personal history of nicotine dependence: Secondary | ICD-10-CM

## 2012-05-20 DIAGNOSIS — M179 Osteoarthritis of knee, unspecified: Secondary | ICD-10-CM

## 2012-05-20 DIAGNOSIS — Z79899 Other long term (current) drug therapy: Secondary | ICD-10-CM

## 2012-05-20 DIAGNOSIS — M25569 Pain in unspecified knee: Secondary | ICD-10-CM | POA: Diagnosis not present

## 2012-05-20 DIAGNOSIS — Z5189 Encounter for other specified aftercare: Secondary | ICD-10-CM | POA: Diagnosis not present

## 2012-05-20 DIAGNOSIS — E049 Nontoxic goiter, unspecified: Secondary | ICD-10-CM | POA: Diagnosis present

## 2012-05-20 DIAGNOSIS — Z7982 Long term (current) use of aspirin: Secondary | ICD-10-CM | POA: Diagnosis not present

## 2012-05-20 DIAGNOSIS — F411 Generalized anxiety disorder: Secondary | ICD-10-CM | POA: Diagnosis present

## 2012-05-20 HISTORY — PX: TOTAL KNEE ARTHROPLASTY: SHX125

## 2012-05-20 LAB — TYPE AND SCREEN
ABO/RH(D): B NEG
Antibody Screen: NEGATIVE

## 2012-05-20 SURGERY — ARTHROPLASTY, KNEE, TOTAL
Anesthesia: Spinal | Site: Knee | Laterality: Right | Wound class: Clean

## 2012-05-20 MED ORDER — DOCUSATE SODIUM 100 MG PO CAPS
100.0000 mg | ORAL_CAPSULE | Freq: Two times a day (BID) | ORAL | Status: DC
  Administered 2012-05-20 – 2012-05-23 (×6): 100 mg via ORAL

## 2012-05-20 MED ORDER — ACETAMINOPHEN 10 MG/ML IV SOLN
1000.0000 mg | Freq: Four times a day (QID) | INTRAVENOUS | Status: AC
  Administered 2012-05-20 – 2012-05-21 (×4): 1000 mg via INTRAVENOUS
  Filled 2012-05-20 (×6): qty 100

## 2012-05-20 MED ORDER — BUPIVACAINE 0.25 % ON-Q PUMP SINGLE CATH 100 ML
INJECTION | Status: DC | PRN
  Administered 2012-05-20: 270 mL

## 2012-05-20 MED ORDER — HYDROMORPHONE HCL 2 MG PO TABS
2.0000 mg | ORAL_TABLET | ORAL | Status: DC | PRN
  Administered 2012-05-20 – 2012-05-21 (×5): 2 mg via ORAL
  Filled 2012-05-20 (×5): qty 1

## 2012-05-20 MED ORDER — PROPOFOL INFUSION 10 MG/ML OPTIME
INTRAVENOUS | Status: DC | PRN
  Administered 2012-05-20: 75 ug/kg/min via INTRAVENOUS

## 2012-05-20 MED ORDER — PROMETHAZINE HCL 25 MG/ML IJ SOLN: 6.2500 mg | INTRAMUSCULAR | Status: DC | PRN

## 2012-05-20 MED ORDER — ONDANSETRON HCL 4 MG/2ML IJ SOLN
INTRAMUSCULAR | Status: DC | PRN
  Administered 2012-05-20: 4 mg via INTRAVENOUS

## 2012-05-20 MED ORDER — LORATADINE 10 MG PO TABS
10.0000 mg | ORAL_TABLET | Freq: Every day | ORAL | Status: DC
  Administered 2012-05-20 – 2012-05-23 (×4): 10 mg via ORAL
  Filled 2012-05-20 (×4): qty 1

## 2012-05-20 MED ORDER — PHENYLEPHRINE HCL 10 MG/ML IJ SOLN
INTRAMUSCULAR | Status: DC | PRN
  Administered 2012-05-20 (×3): 80 ug via INTRAVENOUS

## 2012-05-20 MED ORDER — METOCLOPRAMIDE HCL 10 MG PO TABS: 5.0000 mg | ORAL_TABLET | Freq: Three times a day (TID) | ORAL | Status: DC | PRN

## 2012-05-20 MED ORDER — SODIUM CHLORIDE 0.9 % IR SOLN
Status: DC | PRN
  Administered 2012-05-20: 1000 mL

## 2012-05-20 MED ORDER — CEFAZOLIN SODIUM 1-5 GM-% IV SOLN
1.0000 g | Freq: Four times a day (QID) | INTRAVENOUS | Status: AC
  Administered 2012-05-20 (×2): 1 g via INTRAVENOUS
  Filled 2012-05-20 (×2): qty 50

## 2012-05-20 MED ORDER — PHENOL 1.4 % MT LIQD
1.0000 | OROMUCOSAL | Status: DC | PRN
  Filled 2012-05-20: qty 177

## 2012-05-20 MED ORDER — MIDAZOLAM HCL 5 MG/5ML IJ SOLN
INTRAMUSCULAR | Status: DC | PRN
  Administered 2012-05-20: 2 mg via INTRAVENOUS

## 2012-05-20 MED ORDER — ONDANSETRON HCL 4 MG PO TABS
4.0000 mg | ORAL_TABLET | Freq: Four times a day (QID) | ORAL | Status: DC | PRN
  Administered 2012-05-21: 4 mg via ORAL
  Filled 2012-05-20 (×2): qty 1

## 2012-05-20 MED ORDER — SODIUM CHLORIDE 0.9 % IV SOLN: INTRAVENOUS | Status: DC

## 2012-05-20 MED ORDER — FLEET ENEMA 7-19 GM/118ML RE ENEM: 1.0000 | ENEMA | Freq: Once | RECTAL | Status: AC | PRN

## 2012-05-20 MED ORDER — NALOXONE HCL 0.4 MG/ML IJ SOLN: 0.4000 mg | INTRAMUSCULAR | Status: DC | PRN

## 2012-05-20 MED ORDER — ACETAMINOPHEN 10 MG/ML IV SOLN
1000.0000 mg | Freq: Once | INTRAVENOUS | Status: AC
  Administered 2012-05-20: 1000 mg via INTRAVENOUS

## 2012-05-20 MED ORDER — BUPIVACAINE ON-Q PAIN PUMP (FOR ORDER SET NO CHG)
INJECTION | Status: DC
  Filled 2012-05-20: qty 1

## 2012-05-20 MED ORDER — POLYETHYLENE GLYCOL 3350 17 G PO PACK: 17.0000 g | PACK | Freq: Every day | ORAL | Status: DC | PRN

## 2012-05-20 MED ORDER — METHOCARBAMOL 500 MG PO TABS
500.0000 mg | ORAL_TABLET | Freq: Four times a day (QID) | ORAL | Status: DC | PRN
  Administered 2012-05-20 – 2012-05-22 (×5): 500 mg via ORAL
  Filled 2012-05-20 (×5): qty 1

## 2012-05-20 MED ORDER — ONDANSETRON HCL 4 MG/2ML IJ SOLN
4.0000 mg | Freq: Four times a day (QID) | INTRAMUSCULAR | Status: DC | PRN
  Administered 2012-05-20 – 2012-05-21 (×3): 4 mg via INTRAVENOUS
  Filled 2012-05-20 (×3): qty 2

## 2012-05-20 MED ORDER — FAMOTIDINE IN NACL 20-0.9 MG/50ML-% IV SOLN
20.0000 mg | Freq: Once | INTRAVENOUS | Status: AC
  Administered 2012-05-20: 20 mg via INTRAVENOUS
  Filled 2012-05-20 (×2): qty 50

## 2012-05-20 MED ORDER — MORPHINE SULFATE (PF) 1 MG/ML IV SOLN
INTRAVENOUS | Status: DC
  Administered 2012-05-20: 7 mg via INTRAVENOUS
  Administered 2012-05-20: 21:00:00 via INTRAVENOUS
  Administered 2012-05-20: 1 mg via INTRAVENOUS
  Administered 2012-05-20: 6.99 mg via INTRAVENOUS
  Administered 2012-05-21: 16 mg via INTRAVENOUS
  Filled 2012-05-20: qty 25

## 2012-05-20 MED ORDER — HYDROCORTISONE 2.5 % EX CREA
1.0000 "application " | TOPICAL_CREAM | Freq: Two times a day (BID) | CUTANEOUS | Status: DC | PRN
  Filled 2012-05-20: qty 1

## 2012-05-20 MED ORDER — LORAZEPAM 0.5 MG PO TABS
0.5000 mg | ORAL_TABLET | Freq: Every evening | ORAL | Status: DC | PRN
  Administered 2012-05-20: 0.5 mg via ORAL
  Filled 2012-05-20: qty 1

## 2012-05-20 MED ORDER — LIDOCAINE HCL (CARDIAC) 20 MG/ML IV SOLN
INTRAVENOUS | Status: DC | PRN
  Administered 2012-05-20: 80 mg via INTRAVENOUS

## 2012-05-20 MED ORDER — PANTOPRAZOLE SODIUM 40 MG PO TBEC
80.0000 mg | DELAYED_RELEASE_TABLET | Freq: Every day | ORAL | Status: DC
  Filled 2012-05-20: qty 2

## 2012-05-20 MED ORDER — METOCLOPRAMIDE HCL 5 MG/ML IJ SOLN
5.0000 mg | Freq: Three times a day (TID) | INTRAMUSCULAR | Status: DC | PRN
  Administered 2012-05-20 – 2012-05-21 (×2): 10 mg via INTRAVENOUS
  Filled 2012-05-20 (×2): qty 2

## 2012-05-20 MED ORDER — LACTATED RINGERS IV SOLN
INTRAVENOUS | Status: DC | PRN
  Administered 2012-05-20: 11:00:00 via INTRAVENOUS

## 2012-05-20 MED ORDER — EPHEDRINE SULFATE 50 MG/ML IJ SOLN
INTRAMUSCULAR | Status: DC | PRN
  Administered 2012-05-20 (×2): 10 mg via INTRAVENOUS
  Administered 2012-05-20: 5 mg via INTRAVENOUS

## 2012-05-20 MED ORDER — DIPHENHYDRAMINE HCL 12.5 MG/5ML PO ELIX
12.5000 mg | ORAL_SOLUTION | ORAL | Status: DC | PRN
  Administered 2012-05-20: 12.5 mg via ORAL
  Administered 2012-05-21 (×2): 25 mg via ORAL
  Filled 2012-05-20: qty 5
  Filled 2012-05-20 (×2): qty 10

## 2012-05-20 MED ORDER — DIPHENHYDRAMINE HCL 12.5 MG/5ML PO ELIX: 12.5000 mg | ORAL_SOLUTION | Freq: Four times a day (QID) | ORAL | Status: DC | PRN

## 2012-05-20 MED ORDER — SODIUM CHLORIDE 0.9 % IV SOLN
INTRAVENOUS | Status: DC
  Administered 2012-05-20: 19:00:00 via INTRAVENOUS

## 2012-05-20 MED ORDER — LACTATED RINGERS IV SOLN: INTRAVENOUS | Status: DC

## 2012-05-20 MED ORDER — DEXTROSE 5 % IV SOLN
3.0000 g | INTRAVENOUS | Status: AC
  Administered 2012-05-20: 2 g via INTRAVENOUS
  Filled 2012-05-20: qty 3000

## 2012-05-20 MED ORDER — DIPHENHYDRAMINE HCL 50 MG/ML IJ SOLN: 12.5000 mg | Freq: Four times a day (QID) | INTRAMUSCULAR | Status: DC | PRN

## 2012-05-20 MED ORDER — METOCLOPRAMIDE HCL 5 MG/ML IJ SOLN
INTRAMUSCULAR | Status: DC | PRN
  Administered 2012-05-20: 10 mg via INTRAVENOUS

## 2012-05-20 MED ORDER — HYDROMORPHONE HCL PF 1 MG/ML IJ SOLN: 0.2500 mg | INTRAMUSCULAR | Status: DC | PRN

## 2012-05-20 MED ORDER — ACETAMINOPHEN 650 MG RE SUPP: 650.0000 mg | Freq: Four times a day (QID) | RECTAL | Status: DC | PRN

## 2012-05-20 MED ORDER — SODIUM CHLORIDE 0.9 % IJ SOLN: 9.0000 mL | INTRAMUSCULAR | Status: DC | PRN

## 2012-05-20 MED ORDER — RIVAROXABAN 10 MG PO TABS
10.0000 mg | ORAL_TABLET | Freq: Every day | ORAL | Status: DC
  Administered 2012-05-21 – 2012-05-23 (×3): 10 mg via ORAL
  Filled 2012-05-20 (×4): qty 1

## 2012-05-20 MED ORDER — ACETAMINOPHEN 325 MG PO TABS: 650.0000 mg | ORAL_TABLET | Freq: Four times a day (QID) | ORAL | Status: DC | PRN

## 2012-05-20 MED ORDER — ONDANSETRON HCL 4 MG/2ML IJ SOLN: 4.0000 mg | Freq: Four times a day (QID) | INTRAMUSCULAR | Status: DC | PRN

## 2012-05-20 MED ORDER — METHOCARBAMOL 100 MG/ML IJ SOLN
500.0000 mg | Freq: Four times a day (QID) | INTRAVENOUS | Status: DC | PRN
  Administered 2012-05-21: 500 mg via INTRAVENOUS
  Filled 2012-05-20 (×2): qty 5

## 2012-05-20 MED ORDER — FENTANYL CITRATE 0.05 MG/ML IJ SOLN
INTRAMUSCULAR | Status: DC | PRN
  Administered 2012-05-20 (×2): 50 ug via INTRAVENOUS

## 2012-05-20 MED ORDER — BISACODYL 10 MG RE SUPP: 10.0000 mg | Freq: Every day | RECTAL | Status: DC | PRN

## 2012-05-20 MED ORDER — MENTHOL 3 MG MT LOZG
1.0000 | LOZENGE | OROMUCOSAL | Status: DC | PRN
  Filled 2012-05-20: qty 9

## 2012-05-20 SURGICAL SUPPLY — 53 items
BAG SPEC THK2 15X12 ZIP CLS (MISCELLANEOUS) ×1
BAG ZIPLOCK 12X15 (MISCELLANEOUS) ×2 IMPLANT
BANDAGE ELASTIC 6 VELCRO ST LF (GAUZE/BANDAGES/DRESSINGS) ×2 IMPLANT
BANDAGE ESMARK 6X9 LF (GAUZE/BANDAGES/DRESSINGS) ×1 IMPLANT
BLADE SAG 18X100X1.27 (BLADE) ×2 IMPLANT
BLADE SAW SGTL 11.0X1.19X90.0M (BLADE) ×2 IMPLANT
BNDG CMPR 9X6 STRL LF SNTH (GAUZE/BANDAGES/DRESSINGS) ×1
BNDG ESMARK 6X9 LF (GAUZE/BANDAGES/DRESSINGS) ×2
BOWL SMART MIX CTS (DISPOSABLE) ×2 IMPLANT
CATH KIT ON-Q SILVERSOAK 5 (CATHETERS) ×1 IMPLANT
CATH KIT ON-Q SILVERSOAK 5IN (CATHETERS) ×2 IMPLANT
CEMENT HV SMART SET (Cement) ×4 IMPLANT
CLOTH BEACON ORANGE TIMEOUT ST (SAFETY) ×2 IMPLANT
CUFF TOURN SGL QUICK 34 (TOURNIQUET CUFF) ×2
CUFF TRNQT CYL 34X4X40X1 (TOURNIQUET CUFF) ×1 IMPLANT
DRAPE EXTREMITY T 121X128X90 (DRAPE) ×2 IMPLANT
DRAPE POUCH INSTRU U-SHP 10X18 (DRAPES) ×2 IMPLANT
DRAPE U-SHAPE 47X51 STRL (DRAPES) ×2 IMPLANT
DRSG ADAPTIC 3X8 NADH LF (GAUZE/BANDAGES/DRESSINGS) ×2 IMPLANT
DURAPREP 26ML APPLICATOR (WOUND CARE) ×2 IMPLANT
ELECT REM PT RETURN 9FT ADLT (ELECTROSURGICAL) ×2
ELECTRODE REM PT RTRN 9FT ADLT (ELECTROSURGICAL) ×1 IMPLANT
EVACUATOR 1/8 PVC DRAIN (DRAIN) ×2 IMPLANT
FACESHIELD LNG OPTICON STERILE (SAFETY) ×10 IMPLANT
GLOVE BIO SURGEON STRL SZ8 (GLOVE) ×2 IMPLANT
GLOVE BIOGEL PI IND STRL 8 (GLOVE) ×2 IMPLANT
GLOVE BIOGEL PI INDICATOR 8 (GLOVE) ×2
GLOVE ECLIPSE 8.0 STRL XLNG CF (GLOVE) ×2 IMPLANT
GLOVE SURG SS PI 6.5 STRL IVOR (GLOVE) ×6 IMPLANT
GOWN STRL NON-REIN LRG LVL3 (GOWN DISPOSABLE) ×4 IMPLANT
GOWN STRL REIN XL XLG (GOWN DISPOSABLE) ×3 IMPLANT
HANDPIECE INTERPULSE COAX TIP (DISPOSABLE) ×2
IMMOBILIZER KNEE 20 (SOFTGOODS) ×2
IMMOBILIZER KNEE 20 THIGH 36 (SOFTGOODS) ×1 IMPLANT
KIT BASIN OR (CUSTOM PROCEDURE TRAY) ×2 IMPLANT
MANIFOLD NEPTUNE II (INSTRUMENTS) ×2 IMPLANT
NS IRRIG 1000ML POUR BTL (IV SOLUTION) ×2 IMPLANT
PACK TOTAL JOINT (CUSTOM PROCEDURE TRAY) ×2 IMPLANT
PAD ABD 7.5X8 STRL (GAUZE/BANDAGES/DRESSINGS) ×2 IMPLANT
PADDING CAST COTTON 6X4 STRL (CAST SUPPLIES) ×4 IMPLANT
POSITIONER SURGICAL ARM (MISCELLANEOUS) ×2 IMPLANT
SET HNDPC FAN SPRY TIP SCT (DISPOSABLE) ×1 IMPLANT
SPONGE GAUZE 4X4 12PLY (GAUZE/BANDAGES/DRESSINGS) ×2 IMPLANT
STRIP CLOSURE SKIN 1/2X4 (GAUZE/BANDAGES/DRESSINGS) ×3 IMPLANT
SUCTION FRAZIER 12FR DISP (SUCTIONS) ×2 IMPLANT
SUT MNCRL AB 4-0 PS2 18 (SUTURE) ×2 IMPLANT
SUT VIC AB 2-0 CT1 27 (SUTURE) ×6
SUT VIC AB 2-0 CT1 TAPERPNT 27 (SUTURE) ×3 IMPLANT
SUT VLOC 180 0 24IN GS25 (SUTURE) ×2 IMPLANT
TOWEL OR 17X26 10 PK STRL BLUE (TOWEL DISPOSABLE) ×4 IMPLANT
TRAY FOLEY CATH 14FRSI W/METER (CATHETERS) ×2 IMPLANT
WATER STERILE IRR 1500ML POUR (IV SOLUTION) ×2 IMPLANT
WRAP KNEE MAXI GEL POST OP (GAUZE/BANDAGES/DRESSINGS) ×4 IMPLANT

## 2012-05-20 NOTE — Progress Notes (Signed)
Utilization review completed.  

## 2012-05-20 NOTE — H&P (View-Only) (Signed)
[name]  DOB: 04-Nov-1940 Married / Language: English / Race: White Female  Date Of Admission:  05/20/2012  Chief complaint:  Right Knee Pain  History of Present Illness The patient is a 71 year old female who comes in for a preoperative History and Physical. The patient is scheduled for a right total knee arthroplasty to be performed by Dr. Gus Rankin. Aluisio, MD at [facility] on 05/20/2012. The patient is a 71 year old female who presents for a recheck of Follow-up Knee. The patient is being followed for their right knee pain and osteoarthritis. Symptoms reported today include: pain. The patient feels that they are doing poorly and report their pain level to be moderate to severe. Current treatment includes: pain medications (Hydrocodone for extreme pain). The following medication has been used for pain control: none. The patient has not gotten any relief of their symptoms with Cortisone injections (last injection in February). Note for "Follow-up Knee": She would like to go ahead and get scheduled for total knee arthroplasty. [name] comes in today for recheck of her right knee. The cortisone shot has not helped. The Capzasin cream has not helped. She continues to have pain and difficulty in her right knee and is ready to go ahead and get that scheduled for total knee arthroplasty. They have been treated conservatively in the past for the above stated problem and despite conservative measures, they continue to have progressive pain and severe functional limitations and dysfunction. They have failed non-operative management including home exercise, medications, and injections. It is felt that they would benefit from undergoing total joint replacement. Risks and benefits of the procedure have been discussed with the patient and they elect to proceed with surgery. There are no active contraindications to surgery such as ongoing infection or rapidly progressive neurological  disease.   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 (Thyroid) Colonic Polyps. Adenomatous High blood pressure Osteoarthritis   Family History Cerebrovascular Accident. grandfather mothers side and grandfather fathers side Heart Disease. father Hypertension. mother, father and brother Rheumatoid Arthritis. mother   Social History Alcohol use. current drinker; drinks wine; less than 5 per week Children. 2 Current work status. retired Financial planner (Currently). no Drug/Alcohol Rehab (Previously). no Exercise. Exercises daily; does running / walking and gym / weights Illicit drug use. no Living situation. live with spouse Marital status. married Number of flights of stairs before winded. 2-3 Pain Contract. no Tobacco / smoke exposure. no Tobacco use. Former smoker. quit in 1985 former smoker; smoke(d) less than 1/2 pack(s) per day   Medication History Norco (10-325MG  Tablet, 1 Tablet Oral four times daily, as needed, Taken starting 02/20/2012) Active. Lisinopril ( Oral) Specific dose unknown - Active. Omeprazole ( Oral) Specific dose unknown - Active. Ativan ( Oral) Specific dose unknown - Active. Sudafed ( Oral) Specific dose unknown - Active. ZyrTEC-D Allergy & Congestion ( Oral) Specific dose unknown - Active. Aspirin Childrens (81MG  Tablet Chewable, Oral) Active.   Pregnancy / Birth History Pregnant. no   Past Surgical History Arthroscopy of Knee. right Colon Polyp Removal - Colonoscopy Colon Polyp Removal - Open Hip Fracture and Surgery. right Hysterectomy. complete (non-cancerous) Rotator Cuff Repair. bilateral Total Hip Replacement. right Total Knee Replacement. left   Review of Systems General:Not Present- Chills, Fever, Night Sweats, Appetite Loss, Fatigue, Feeling sick, Weight Gain and Weight Loss. Skin:Not Present- Itching, Rash, Skin Color Changes, Ulcer, Psoriasis  and Change in Hair or Nails. HEENT:Not Present- Sensitivity to light, Hearing problems,  Nose Bleed and Ringing in the Ears. Neck:Not Present- Swollen Glands and Neck Mass. Respiratory:Not Present- Snoring, Chronic Cough, Bloody sputum and Dyspnea. Cardiovascular:Present- Leg Cramps. Not Present- Shortness of Breath, Chest Pain, Swelling of Extremities and Palpitations. Gastrointestinal:Not Present- Bloody Stool, Heartburn, Abdominal Pain, Vomiting, Nausea and Incontinence of Stool. Female Genitourinary:Present- Frequency. Not Present- Blood in Urine, Menstrual Irregularities, Incontinence and Nocturia. Musculoskeletal:Present- Muscle Pain, Joint Stiffness, Joint Swelling, Joint Pain and Back Pain. Not Present- Muscle Weakness. Neurological:Not Present- Tingling, Numbness, Burning, Tremor, Headaches and Dizziness. Psychiatric:Not Present- Anxiety, Depression and Memory Loss. Endocrine:Not Present- Cold Intolerance, Heat Intolerance, Excessive hunger and Excessive Thirst. Hematology:Present- Easy Bruising. Not Present- Abnormal Bleeding, Anemia and Blood Clots.   Vitals Pulse: 64 (Regular) Resp.: 12 (Unlabored) BP: 124/72 (Sitting, Left Arm, Standard)    Physical Exam(DREW L PERKINS, PA-C; 05/07/2012 3:59 PM) The physical exam findings are as follows: Patient is a 71 year old female with conitnued knee pain.   General Mental Status - Alert, cooperative and good historian. General Appearance- pleasant. Not in acute distress. Orientation- Oriented X3. Build & Nutrition- Well nourished and Well developed.   Head and Neck Head- normocephalic, atraumatic . Neck Global Assessment- supple. no bruit auscultated on the right and no bruit auscultated on the left.   Eye Pupil- Bilateral- Regular and Round. Motion- Bilateral- EOMI.   Chest and Lung Exam Auscultation: Breath sounds:- clear at anterior chest wall and - clear at posterior chest  wall. Adventitious sounds:- No Adventitious sounds.   Cardiovascular Auscultation:Rhythm- Regular rate and rhythm. Heart Sounds- S1 WNL and S2 WNL. Murmurs & Other Heart Sounds:Auscultation of the heart reveals - No Murmurs.   Abdomen Palpation/Percussion:Tenderness- Abdomen is non-tender to palpation. Rigidity (guarding)- Abdomen is soft. Auscultation:Auscultation of the abdomen reveals - Bowel sounds normal.   Female Genitourinary Not done, not pertinent to present illness  Musculoskeletal Today she has crepitation throughout the range of motion. Moderate soft tissue swelling. Valgus deformity. Sensation and circulation are intact. She has 3 degrees from full extension with further flexion to 130 degrees.  RADIOGRAPHS: X rays show bone on bone lateral compartment, moderate medial and patellofemoral changes.  Assessment & Plan Osteoarthrosis NOS, lower leg (715.96) Impression: Right Knee  Note: Patient is for a Right Total Knee Replacement by Dr. Lequita Halt.  PCP - Dr. Drue Novel  Signed electronically by Roberts Gaudy, PA-C

## 2012-05-20 NOTE — Anesthesia Postprocedure Evaluation (Signed)
  Anesthesia Post-op Note  Patient: [name]  Procedure(s) Performed: Procedure(s) (LRB): TOTAL KNEE ARTHROPLASTY (Right)  Patient Location: PACU  Anesthesia Type: Spinal  Level of Consciousness: awake and alert   Airway and Oxygen Therapy: Patient Spontanous Breathing  Post-op Pain: mild  Post-op Assessment: Post-op Vital signs reviewed, Patient's Cardiovascular Status Stable, Respiratory Function Stable, Patent Airway and No signs of Nausea or vomiting  Post-op Vital Signs: stable  Complications: No apparent anesthesia complications

## 2012-05-20 NOTE — Transfer of Care (Signed)
Immediate Anesthesia Transfer of Care Note  Patient: [name]  Procedure(s) Performed: Procedure(s) (LRB) with comments: TOTAL KNEE ARTHROPLASTY (Right)  Patient Location: PACU  Anesthesia Type: MAC and Spinal  Level of Consciousness: awake, alert , oriented and patient cooperative  Airway & Oxygen Therapy: Patient Spontanous Breathing and Patient connected to face mask oxygen  Post-op Assessment: Report given to PACU RN and Post -op Vital signs reviewed and stable  Post vital signs: Reviewed and stable  Complications: No apparent anesthesia complications

## 2012-05-20 NOTE — Anesthesia Procedure Notes (Signed)

## 2012-05-20 NOTE — Preoperative (Signed)
Beta Blockers   Reason not to administer Beta Blockers:Not Applicable 

## 2012-05-20 NOTE — Anesthesia Preprocedure Evaluation (Addendum)
Anesthesia Evaluation  Patient identified by MRN, date of birth, ID band Patient awake    Reviewed: Allergy & Precautions, H&P , NPO status , Patient's Chart, lab work & pertinent test results  History of Anesthesia Complications (+) AWARENESS UNDER ANESTHESIA  Airway Mallampati: II TM Distance: >3 FB Neck ROM: Full    Dental No notable dental hx.    Pulmonary neg pulmonary ROS,  breath sounds clear to auscultation  Pulmonary exam normal       Cardiovascular hypertension, Rhythm:Regular Rate:Normal     Neuro/Psych negative neurological ROS  negative psych ROS   GI/Hepatic negative GI ROS, Neg liver ROS,   Endo/Other  negative endocrine ROS  Renal/GU negative Renal ROS  negative genitourinary   Musculoskeletal negative musculoskeletal ROS (+)   Abdominal   Peds negative pediatric ROS (+)  Hematology negative hematology ROS (+)   Anesthesia Other Findings   Reproductive/Obstetrics negative OB ROS                          Anesthesia Physical Anesthesia Plan  ASA: II  Anesthesia Plan: Spinal   Post-op Pain Management:    Induction:   Airway Management Planned: Simple Face Mask  Additional Equipment:   Intra-op Plan:   Post-operative Plan:   Informed Consent: I have reviewed the patients History and Physical, chart, labs and discussed the procedure including the risks, benefits and alternatives for the proposed anesthesia with the patient or authorized representative who has indicated his/her understanding and acceptance.     Plan Discussed with: CRNA and Surgeon  Anesthesia Plan Comments:         Anesthesia Quick Evaluation

## 2012-05-20 NOTE — Interval H&P Note (Signed)
History and Physical Interval Note:  05/20/2012 10:34 AM  [name]  has presented today for surgery, with the diagnosis of osteoarthritis right knee   The various methods of treatment have been discussed with the patient and family. After consideration of risks, benefits and other options for treatment, the patient has consented to  Procedure(s) (LRB) with comments: TOTAL KNEE ARTHROPLASTY (Right) as a surgical intervention .  The patient's history has been reviewed, patient examined, no change in status, stable for surgery.  I have reviewed the patient's chart and labs.  Questions were answered to the patient's satisfaction.     Loanne Drilling

## 2012-05-20 NOTE — Op Note (Signed)
Pre-operative diagnosis- Osteoarthritis  Right knee(s)  Post-operative diagnosis- Osteoarthritis Right knee(s)  Procedure-  Right  Total Knee Arthroplasty  Surgeon- Gus Rankin. Ski Polich, MD  Assistant- Leilani Able, PA-C   Anesthesia-  Spinal EBL-* No blood loss amount entered *  Drains Hemovac  Tourniquet time-  Total Tourniquet Time Documented: Thigh (Right) - 32 minutes   Complications- None  Condition-PACU - hemodynamically stable.   Brief Clinical Note  [name] is a 71 y.o. year old female with end stage OA of her right knee with progressively worsening pain and dysfunction. She has constant pain, with activity and at rest and significant functional deficits with difficulties even with ADLs. She has had extensive non-op management including analgesics, injections of cortisone and viscosupplements, and home exercise program, but remains in significant pain with significant dysfunction.Radiographs show bone on bone arthritis lateral and patellofemoral. She presents now for right Total Knee Arthroplasty.    Procedure in detail---   The patient is brought into the operating room and positioned supine on the operating table. After successful administration of  Spinal,   a tourniquet is placed high on the  Left thigh(s) and the lower extremity is prepped and draped in the usual sterile fashion. Time out is performed by the operating team and then the  Left lower extremity is wrapped in Esmarch, knee flexed and the tourniquet inflated to 300 mmHg.       A midline incision is made with a ten blade through the subcutaneous tissue to the level of the extensor mechanism. A fresh blade is used to make a medial parapatellar arthrotomy. Soft tissue over the proximal medial tibia is subperiosteally elevated to the joint line with a knife and into the semimembranosus bursa with a Cobb elevator. Soft tissue over the proximal lateral tibia is elevated with attention being paid to avoiding the  patellar tendon on the tibial tubercle. The patella is everted, knee flexed 90 degrees and the ACL and PCL are removed. Findings are bone on bone lateral and patellofemoral        The drill is used to create a starting hole in the distal femur and the canal is thoroughly irrigated with sterile saline to remove the fatty contents. The 5 degree Left  valgus alignment guide is placed into the femoral canal and the distal femoral cutting block is pinned to remove 10 mm off the distal femur. Resection is made with an oscillating saw.      The tibia is subluxed forward and the menisci are removed. The extramedullary alignment guide is placed referencing proximally at the medial aspect of the tibial tubercle and distally along the second metatarsal axis and tibial crest. The block is pinned to remove 2mm off the more deficient lateral  side. Resection is made with an oscillating saw. Size 3is the most appropriate size for the tibia and the proximal tibia is prepared with the modular drill and keel punch for that size.      The femoral sizing guide is placed and size 4 narrow is most appropriate. Rotation is marked off the epicondylar axis and confirmed by creating a rectangular flexion gap at 90 degrees. The size 4 cutting block is pinned in this rotation and the anterior, posterior and chamfer cuts are made with the oscillating saw. The intercondylar block is then placed and that cut is made.      Trial size 3 tibial component, trial size 4 narrow posterior stabilized femur and a 10  mm posterior stabilized  rotating platform insert trial is placed. Full extension is achieved with excellent varus/valgus and anterior/posterior balance throughout full range of motion. The patella is everted and thickness measured to be 22  mm. Free hand resection is taken to 12 mm, a 38 template is placed, lug holes are drilled, trial patella is placed, and it tracks normally. Osteophytes are removed off the posterior femur with the  trial in place. All trials are removed and the cut bone surfaces prepared with pulsatile lavage. Cement is mixed and once ready for implantation, the size 3 tibial implant, size  4 narrow posterior stabilized femoral component, and the size 38 patella are cemented in place and the patella is held with the clamp. The trial insert is placed and the knee held in full extension. All extruded cement is removed and once the cement is hard the permanent 10 mm posterior stabilized rotating platform insert is placed into the tibial tray.      The wound is copiously irrigated with saline solution and the extensor mechanism closed over a hemovac drain with #1 PDS suture. The tourniquet is released for a total tourniquet time of 31  minutes. Flexion against gravity is 140 degrees and the patella tracks normally. Subcutaneous tissue is closed with 2.0 vicryl and subcuticular with running 4.0 Monocryl. The catheter for the Marcaine pain pump is placed and the pump is initiated. The incision is cleaned and dried and steri-strips and a bulky sterile dressing are applied. The limb is placed into a knee immobilizer and the patient is awakened and transported to recovery in stable condition.      Please note that a surgical assistant was a medical necessity for this procedure in order to perform it in a safe and expeditious manner. Surgical assistant was necessary to retract the ligaments and vital neurovascular structures to prevent injury to them and also necessary for proper positioning of the limb to allow for anatomic placement of the prosthesis.   Gus Rankin Marilee Ditommaso, MD    05/20/2012, 12:24 PM

## 2012-05-21 ENCOUNTER — Encounter (HOSPITAL_COMMUNITY): Payer: Self-pay | Admitting: Orthopedic Surgery

## 2012-05-21 LAB — CBC
HCT: 40.1 % (ref 36.0–46.0)
Hemoglobin: 13.1 g/dL (ref 12.0–15.0)
MCH: 29.9 pg (ref 26.0–34.0)
MCHC: 32.7 g/dL (ref 30.0–36.0)
MCV: 91.6 fL (ref 78.0–100.0)
Platelets: 262 10*3/uL (ref 150–400)
RBC: 4.38 MIL/uL (ref 3.87–5.11)
RDW: 13.6 % (ref 11.5–15.5)
WBC: 12.7 10*3/uL — ABNORMAL HIGH (ref 4.0–10.5)

## 2012-05-21 LAB — BASIC METABOLIC PANEL
BUN: 10 mg/dL (ref 6–23)
CO2: 25 mEq/L (ref 19–32)
Calcium: 8.9 mg/dL (ref 8.4–10.5)
Chloride: 100 mEq/L (ref 96–112)
Creatinine, Ser: 0.85 mg/dL (ref 0.50–1.10)
GFR calc Af Amer: 78 mL/min — ABNORMAL LOW (ref >90)
GFR calc non Af Amer: 67 mL/min — ABNORMAL LOW (ref >90)
Glucose, Bld: 168 mg/dL — ABNORMAL HIGH (ref 70–99)
Potassium: 3.9 mEq/L (ref 3.5–5.1)
Sodium: 136 mEq/L (ref 135–145)

## 2012-05-21 MED ORDER — CALCIUM CARBONATE ANTACID 500 MG PO CHEW
1.0000 | CHEWABLE_TABLET | Freq: Three times a day (TID) | ORAL | Status: DC | PRN
  Administered 2012-05-21 – 2012-05-22 (×2): 200 mg via ORAL
  Filled 2012-05-21 (×2): qty 1

## 2012-05-21 MED ORDER — HYDROMORPHONE HCL PF 1 MG/ML IJ SOLN
INTRAMUSCULAR | Status: AC
  Administered 2012-05-21: 0.5 mg via INTRAVENOUS
  Filled 2012-05-21: qty 1

## 2012-05-21 MED ORDER — OMEPRAZOLE 20 MG PO CPDR
40.0000 mg | DELAYED_RELEASE_CAPSULE | Freq: Every day | ORAL | Status: DC
  Administered 2012-05-21 – 2012-05-23 (×3): 40 mg via ORAL
  Filled 2012-05-21 (×3): qty 2

## 2012-05-21 MED ORDER — NON FORMULARY: 40.0000 mg | Freq: Every day | Status: DC

## 2012-05-21 MED ORDER — MORPHINE SULFATE 2 MG/ML IJ SOLN: 1.0000 mg | INTRAMUSCULAR | Status: DC | PRN

## 2012-05-21 MED ORDER — HYDROMORPHONE HCL PF 1 MG/ML IJ SOLN
0.5000 mg | INTRAMUSCULAR | Status: DC | PRN
  Administered 2012-05-21: 0.5 mg via INTRAVENOUS

## 2012-05-21 MED ORDER — HYDROCODONE-ACETAMINOPHEN 7.5-325 MG PO TABS
1.0000 | ORAL_TABLET | Freq: Four times a day (QID) | ORAL | Status: DC | PRN
  Administered 2012-05-21 – 2012-05-23 (×8): 1 via ORAL
  Filled 2012-05-21 (×8): qty 1

## 2012-05-21 MED FILL — Bupivacaine HCl Inj 0.25%: Qty: 300 | Status: AC

## 2012-05-21 NOTE — Progress Notes (Signed)
Clinical Social Work Department BRIEF PSYCHOSOCIAL ASSESSMENT 05/21/2012  Patient:  [name], [name]     Account Number:  [id number]     Admit date:  05/20/2012  Clinical Social Worker:  Candie Chroman  Date/Time:  05/21/2012 04:20 PM  Referred by:  Physician  Date Referred:  05/21/2012 Referred for  SNF Placement   Other Referral:   Interview type:  Patient Other interview type:    PSYCHOSOCIAL DATA Living Status:  HUSBAND Admitted from facility:   Level of care:   Primary support name:  Leanora Cover Primary support relationship to patient:  SPOUSE Degree of support available:   unclear    CURRENT CONCERNS Current Concerns  Post-Acute Placement   Other Concerns:    SOCIAL WORK ASSESSMENT / PLAN Pt is a 71 yr old female living at home prior to hospitalization. CSW met with pt to assist with d/c planning. PT is recommending ST SNF following hospital d/c. Pt is in agreement with this plan. SNF search has been initiated and bed offers will be provided as received.   Assessment/plan status:  Psychosocial Support/Ongoing Assessment of Needs Other assessment/ plan:   Information/referral to community resources:   SNF list provided.    PATIENT'S/FAMILY'S RESPONSE TO PLAN OF CARE: Pt would like to have ST rehab at [facility].    Cori Razor LCSW [phone]

## 2012-05-21 NOTE — Evaluation (Signed)
Physical Therapy Evaluation Patient Details Name: [name] MRN: [id number] DOB: 03/05/1941 Today's Date: 05/21/2012 Time: 7829-5621 PT Time Calculation (min): 53 min  PT Assessment / Plan / Recommendation Clinical Impression  Pt s/p R TKR presents with decreased R LE strength/ROM and increased pain limiting functional mobiltiy    PT Assessment  Patient needs continued PT services    Follow Up Recommendations  Skilled nursing facility    Barriers to Discharge        Equipment Recommendations  Defer to next venue    Recommendations for Other Services OT consult   Frequency 7X/week    Precautions / Restrictions Precautions Precautions: Knee Required Braces or Orthoses: Knee Immobilizer - Right Knee Immobilizer - Right: Discontinue once straight leg raise with < 10 degree lag Restrictions Weight Bearing Restrictions: No Other Position/Activity Restrictions: WBAT   Pertinent Vitals/Pain 8/10, ice packs provided, RN aware      Mobility  Bed Mobility Bed Mobility: Supine to Sit Supine to Sit: 4: Min assist;3: Mod assist Details for Bed Mobility Assistance: cues for sequence and use of L LE to self assist Transfers Transfers: Sit to Stand;Stand to Sit;Stand Pivot Transfers Sit to Stand: 3: Mod assist Stand to Sit: 3: Mod assist Stand Pivot Transfers: 3: Mod assist Details for Transfer Assistance: cues for use of UEs and LE management.  Stand pvt bed to/from [facility] wtih RW and mod assist Ambulation/Gait Ambulation/Gait Assistance: 3: Mod assist Ambulation Distance (Feet): 7 Feet Assistive device: Rolling walker Ambulation/Gait Assistance Details: cues for sequence, posture, and position from RW Gait Pattern: Step-to pattern    Exercises Total Joint Exercises Ankle Circles/Pumps: AROM;10 reps;Supine;Both Quad Sets: 10 reps;Supine;AROM;Both Heel Slides: AAROM;10 reps;Supine;Right Straight Leg Raises: AAROM;10 reps;Supine;Right   PT Diagnosis: Difficulty walking  PT  Problem List: Decreased strength;Decreased range of motion;Decreased mobility;Decreased knowledge of use of DME;Pain;Decreased activity tolerance PT Treatment Interventions: DME instruction;Gait training;Stair training;Functional mobility training;Therapeutic activities;Therapeutic exercise;Patient/family education   PT Goals Acute Rehab PT Goals PT Goal Formulation: With patient Time For Goal Achievement: 05/28/12 Potential to Achieve Goals: Good Pt will go Supine/Side to Sit: with supervision PT Goal: Supine/Side to Sit - Progress: Goal set today Pt will go Sit to Supine/Side: with supervision PT Goal: Sit to Supine/Side - Progress: Goal set today Pt will go Sit to Stand: with supervision PT Goal: Sit to Stand - Progress: Goal set today Pt will go Stand to Sit: with supervision PT Goal: Stand to Sit - Progress: Goal set today Pt will Ambulate: 51 - 150 feet;with supervision;with rolling walker PT Goal: Ambulate - Progress: Goal set today  Visit Information  Last PT Received On: 05/21/12 Assistance Needed: +2    Subjective Data  Subjective:  (I am not sure I can go home after the hospital) Patient Stated Goal: This knee needs to bend better than the other one   Prior Functioning  Home Living Lives With: Spouse Available Help at Discharge: Family Type of Home: House Home Access: Stairs to enter Secretary/administrator of Steps: 4 (2+2) Entrance Stairs-Rails: None Home Layout: One level Prior Function Level of Independence: Independent Able to Take Stairs?: Yes Vocation: Retired Musician: No difficulties Dominant Hand: Right    Cognition  Overall Cognitive Status: Appears within functional limits for tasks assessed/performed Arousal/Alertness: Awake/alert Orientation Level: Appears intact for tasks assessed Behavior During Session: [facility] for tasks performed    Extremity/Trunk Assessment Right Upper Extremity Assessment RUE ROM/Strength/Tone: Within  functional levels Left Upper Extremity Assessment LUE ROM/Strength/Tone: [facility] for  tasks assessed Right Lower Extremity Assessment RLE ROM/Strength/Tone: Deficits RLE ROM/Strength/Tone Deficits: 2/5 Quads; AAROM at knee -10 - 40 Left Lower Extremity Assessment LLE ROM/Strength/Tone: Deficits LLE ROM/Strength/Tone Deficits: strength WFL with AROM at knee to 90 flex   Balance    End of Session PT - End of Session Equipment Utilized During Treatment: Right knee immobilizer Activity Tolerance: Patient limited by pain;Patient limited by fatigue Patient left: in chair;with call bell/phone within reach;with family/visitor present Nurse Communication: Mobility status;Patient requests pain meds  GP     [name] 05/21/2012, 12:58 PM

## 2012-05-21 NOTE — Progress Notes (Signed)
Physical Therapy Treatment Patient Details Name: [name] MRN: [id number] DOB: [birth year] Today's Date: 05/21/2012 Time: 5956-3875 PT Time Calculation (min): 32 min  PT Assessment / Plan / Recommendation Comments on Treatment Session       Follow Up Recommendations  Skilled nursing facility    Barriers to Discharge        Equipment Recommendations  Defer to next venue    Recommendations for Other Services OT consult  Frequency 7X/week   Plan Discharge plan remains appropriate    Precautions / Restrictions Precautions Precautions: Knee Required Braces or Orthoses: Knee Immobilizer - Right Knee Immobilizer - Right: Discontinue once straight leg raise with < 10 degree lag Restrictions Weight Bearing Restrictions: No Other Position/Activity Restrictions: WBAT   Pertinent Vitals/Pain 5/10    Mobility  Bed Mobility Bed Mobility: Sit to Supine Sit to Supine: 3: Mod assist;4: Min assist Details for Bed Mobility Assistance: cues for sequence and use of L LE to self assist Transfers Transfers: Sit to Stand;Stand to Sit;Stand Pivot Transfers Sit to Stand: 3: Mod assist Stand to Sit: 3: Mod assist Stand Pivot Transfers: 3: Mod assist Details for Transfer Assistance: cues for use of UEs and LE management.  Stand pvt BSC to bed with RW and mod assist Ambulation/Gait Ambulation/Gait Assistance: 3: Mod assist;4: Min assist Ambulation Distance (Feet): 46 Feet Assistive device: Rolling walker Ambulation/Gait Assistance Details: cues for posture, position from RW, sequence, and stride length Gait Pattern: Step-to pattern    Exercises     PT Diagnosis:    PT Problem List:   PT Treatment Interventions:     PT Goals Acute Rehab PT Goals PT Goal Formulation: With patient Time For Goal Achievement: 05/28/12 Potential to Achieve Goals: Good Pt will go Supine/Side to Sit: with supervision PT Goal: Supine/Side to Sit - Progress: Progressing toward goal Pt will go Sit to  Supine/Side: with supervision PT Goal: Sit to Supine/Side - Progress: Progressing toward goal Pt will go Sit to Stand: with supervision PT Goal: Sit to Stand - Progress: Progressing toward goal Pt will go Stand to Sit: with supervision PT Goal: Stand to Sit - Progress: Progressing toward goal Pt will Ambulate: 51 - 150 feet;with supervision;with rolling walker PT Goal: Ambulate - Progress: Progressing toward goal  Visit Information  Last PT Received On: 05/21/12 Assistance Needed: +1    Subjective Data  Subjective: I feel much better than this morning Patient Stated Goal: This knee needs to bend better than the other one   Cognition  Overall Cognitive Status: Appears within functional limits for tasks assessed/performed Arousal/Alertness: Awake/alert Orientation Level: Appears intact for tasks assessed Behavior During Session: [facility] for tasks performed    Balance     End of Session PT - End of Session Equipment Utilized During Treatment: Right knee immobilizer Activity Tolerance: Patient tolerated treatment well Patient left: in bed;with call bell/phone within reach Nurse Communication: Mobility status   GP     Earmon Sherrow 05/21/2012, 4:21 PM

## 2012-05-21 NOTE — Plan of Care (Signed)
Problem: Phase II Progression Outcomes Goal: Tolerating diet Outcome: Progressing Nausea

## 2012-05-21 NOTE — Progress Notes (Signed)
Clinical Social Work Department CLINICAL SOCIAL WORK PLACEMENT NOTE 05/21/2012  Patient:  [name], [name]  Account Number:  [id number] Admit date:  05/20/2012  Clinical Social Worker:  Cori Razor, LCSW  Date/time:  05/21/2012 04:26 PM  Clinical Social Work is seeking post-discharge placement for this patient at the following level of care:   SKILLED NURSING   (*CSW will update this form in Epic as items are completed)   05/21/2012  Patient/family provided with [facility] Department of Clinical Social Work's list of facilities offering this level of care within the geographic area requested by the patient (or if unable, by the patient's family).  05/21/2012  Patient/family informed of their freedom to choose among providers that offer the needed level of care, that participate in Medicare, Medicaid or managed care program needed by the patient, have an available bed and are willing to accept the patient.    Patient/family informed of [facility]' ownership interest in [facility], as well as of the fact that they are under no obligation to receive care at this facility.  PASARR submitted to EDS on 05/21/2012 PASARR number received from EDS on 05/21/2012  FL2 transmitted to all facilities in geographic area requested by pt/family on  05/21/2012 FL2 transmitted to all facilities within larger geographic area on   Patient informed that his/her managed care company has contracts with or will negotiate with  certain facilities, including the following:     Patient/family informed of bed offers received:   Patient chooses bed at  Physician recommends and patient chooses bed at    Patient to be transferred to  on   Patient to be transferred to facility by   The following physician request were entered in Epic:   Additional Comments:  Cori Razor LCSW [phone]

## 2012-05-21 NOTE — Progress Notes (Signed)
   Subjective: 1 Day Post-Op Procedure(s) (LRB): TOTAL KNEE ARTHROPLASTY (Right) Patient reports pain as moderate and severe last night but better this morning.  Did have some itching last night also. Patient seen in rounds with Dr. Lequita Halt. Patient is having problems with pain in the knee, requiring pain medications We will start therapy today.  Plan is to go Home after hospital stay.  Objective: Vital signs in last 24 hours: Temp:  [97.3 F (36.3 C)-98.8 F (37.1 C)] 98.6 F (37 C) (09/10 0622) Pulse Rate:  [25-71] 71  (09/10 0622) Resp:  [12-20] 12  (09/10 0622) BP: (85-182)/(44-155) 154/86 mmHg (09/10 0622) SpO2:  [79 %-100 %] 100 % (09/10 0622) FiO2 (%):  [98 %] 98 % (09/09 1700) Weight:  [78.472 kg (173 lb)] 78.472 kg (173 lb) (09/09 1500)  Intake/Output from previous day:  Intake/Output Summary (Last 24 hours) at 05/21/12 0742 Last data filed at 05/21/12 0625  Gross per 24 hour  Intake 3443.75 ml  Output   1835 ml  Net 1608.75 ml    Intake/Output this shift:    Labs:  Basename 05/21/12 0405  HGB 13.1    Basename 05/21/12 0405  WBC 12.7*  RBC 4.38  HCT 40.1  PLT 262    Basename 05/21/12 0405  NA 136  K 3.9  CL 100  CO2 25  BUN 10  CREATININE 0.85  GLUCOSE 168*  CALCIUM 8.9   No results found for this basename: LABPT:2,INR:2 in the last 72 hours  EXAM General - Patient is Alert, Appropriate and Oriented Extremity - Neurovascular intact Sensation intact distally Dorsiflexion/Plantar flexion intact Dressing - dressing C/D/I Motor Function - intact, moving foot and toes well on exam.  Hemovac pulled without difficulty.  Past Medical History  Diagnosis Date  . History of elevated homocysteine   . Thyroid nodule     rt benign colloid nodule--left hyperplastic nodule- Thyroid bx 2006: neg  . Baker's cyst   . Goiter   . Adenomatous colon polyp 03/2003  . PONV (postoperative nausea and vomiting)   . Anxiety     ativan for sleep  . OA  (osteoarthritis)      knee pain after TKR left , back pain- last steroid injection back 2 months ago Dr Ethelene Hal  . Hypertension 09/11/2005    Assessment/Plan: 1 Day Post-Op Procedure(s) (LRB): TOTAL KNEE ARTHROPLASTY (Right) Principal Problem:  *OA (osteoarthritis) of knee   Advance diet Up with therapy Discharge home with home health  DVT Prophylaxis - Xarelto Weight-Bearing as tolerated to right leg No vaccines. D/C PCA Morphine, Change to IV push D/C O2 and Pulse OX and try on Room Air  PERKINS, Marlowe Sax 05/21/2012, 7:42 AM

## 2012-05-22 LAB — BASIC METABOLIC PANEL
BUN: 7 mg/dL (ref 6–23)
CO2: 26 mEq/L (ref 19–32)
Calcium: 9.4 mg/dL (ref 8.4–10.5)
Chloride: 99 mEq/L (ref 96–112)
Creatinine, Ser: 0.77 mg/dL (ref 0.50–1.10)
GFR calc Af Amer: >90 mL/min (ref >90)
GFR calc non Af Amer: 83 mL/min — ABNORMAL LOW (ref >90)
Glucose, Bld: 123 mg/dL — ABNORMAL HIGH (ref 70–99)
Potassium: 4 mEq/L (ref 3.5–5.1)
Sodium: 134 mEq/L — ABNORMAL LOW (ref 135–145)

## 2012-05-22 LAB — CBC
HCT: 37.6 % (ref 36.0–46.0)
Hemoglobin: 12.5 g/dL (ref 12.0–15.0)
MCH: 30.6 pg (ref 26.0–34.0)
MCHC: 33.2 g/dL (ref 30.0–36.0)
MCV: 92.2 fL (ref 78.0–100.0)
Platelets: 260 K/uL (ref 150–400)
RBC: 4.08 MIL/uL (ref 3.87–5.11)
RDW: 13.6 % (ref 11.5–15.5)
WBC: 11.8 K/uL — ABNORMAL HIGH (ref 4.0–10.5)

## 2012-05-22 NOTE — Care Management Note (Signed)
    Page 1 of 2   05/22/2012     5:45:43 PM   CARE MANAGEMENT NOTE 05/22/2012  Patient:  [name], [name]   Account Number:  [id number]  Date Initiated:  05/22/2012  Documentation initiated by:  Colleen Can  Subjective/Objective Assessment:   dx osteoarthritis right knee; total knee replacemnt     Action/Plan:   Plans are for SNF rehab   Anticipated DC Date:  05/23/2012   Anticipated DC Plan:  SKILLED NURSING FACILITY  In-house referral  Clinical Social Worker      DC Planning Services  NA      [facility] Choice  NA   Choice offered to / List presented to:  NA   DME arranged  NA      DME agency  NA     HH arranged  NA      HH agency  NA   Status of service:  Completed, signed off Medicare Important Message given?  NA - LOS <3 / Initial given by admissions (If response is "NO", the following Medicare IM given date fields will be blank) Date Medicare IM given:   Date Additional Medicare IM given:    Discharge Disposition:    Per UR Regulation:    If discussed at Long Length of Stay Meetings, dates discussed:    Comments:

## 2012-05-22 NOTE — Progress Notes (Signed)
Physical Therapy Treatment Patient Details Name: [name] MRN: [id number] DOB: June 20, 1941 Today's Date: 05/22/2012 Time: 2952-8413 PT Time Calculation (min): 26 min  PT Assessment / Plan / Recommendation Comments on Treatment Session       Follow Up Recommendations  Skilled nursing facility    Barriers to Discharge        Equipment Recommendations  Defer to next venue    Recommendations for Other Services OT consult  Frequency 7X/week   Plan Discharge plan remains appropriate    Precautions / Restrictions Precautions Precautions: Knee Required Braces or Orthoses: Knee Immobilizer - Right Knee Immobilizer - Right: Discontinue once straight leg raise with < 10 degree lag Restrictions Weight Bearing Restrictions: No Other Position/Activity Restrictions: WBAT   Pertinent Vitals/Pain 3-4/10; premedicated    Mobility  Bed Mobility Bed Mobility: Sit to Supine Sit to Supine: 4: Min assist Details for Bed Mobility Assistance: cues for sequence and use of L LE to self assist Transfers Transfers: Sit to Stand;Stand to Sit Sit to Stand: 4: Min guard Stand to Sit: 4: Min guard Details for Transfer Assistance: cues for use of UEs and LE management. Ambulation/Gait Ambulation/Gait Assistance: 4: Min assist;4: Min Government social research officer (Feet): 147 Feet Assistive device: Rolling walker Ambulation/Gait Assistance Details: cues for posture and position from RW Gait Pattern: Step-to pattern    Exercises     PT Diagnosis:    PT Problem List:   PT Treatment Interventions:     PT Goals Acute Rehab PT Goals PT Goal Formulation: With patient Time For Goal Achievement: 05/28/12 Potential to Achieve Goals: Good Pt will go Supine/Side to Sit: with supervision PT Goal: Supine/Side to Sit - Progress: Progressing toward goal Pt will go Sit to Supine/Side: with supervision PT Goal: Sit to Supine/Side - Progress: Progressing toward goal Pt will go Sit to Stand: with  supervision PT Goal: Sit to Stand - Progress: Progressing toward goal Pt will go Stand to Sit: with supervision PT Goal: Stand to Sit - Progress: Progressing toward goal Pt will Ambulate: 51 - 150 feet;with supervision;with rolling walker PT Goal: Ambulate - Progress: Progressing toward goal  Visit Information  Last PT Received On: 05/22/12 Assistance Needed: +1    Subjective Data  Patient Stated Goal: This knee needs to bend better than the other one   Cognition  Overall Cognitive Status: Appears within functional limits for tasks assessed/performed Arousal/Alertness: Awake/alert Orientation Level: Appears intact for tasks assessed Behavior During Session: [facility] for tasks performed    Balance     End of Session PT - End of Session Equipment Utilized During Treatment: Right knee immobilizer Activity Tolerance: Patient tolerated treatment well Patient left: with call bell/phone within reach;in bed Nurse Communication: Mobility status CPM Right Knee CPM Right Knee: On   GP     [name] 05/22/2012, 4:21 PM

## 2012-05-22 NOTE — Progress Notes (Signed)
Physical Therapy Treatment Patient Details Name: [name] MRN: [id number] DOB: December 27, 1940 Today's Date: 05/22/2012 Time: 6213-0865 PT Time Calculation (min): 32 min  PT Assessment / Plan / Recommendation Comments on Treatment Session       Follow Up Recommendations  Skilled nursing facility    Barriers to Discharge        Equipment Recommendations  Defer to next venue    Recommendations for Other Services OT consult  Frequency 7X/week   Plan Discharge plan remains appropriate    Precautions / Restrictions Precautions Precautions: Knee Required Braces or Orthoses: Knee Immobilizer - Right Knee Immobilizer - Right: Discontinue once straight leg raise with < 10 degree lag Restrictions Weight Bearing Restrictions: No Other Position/Activity Restrictions: WBAT   Pertinent Vitals/Pain 5/10; premedicated, cold packs provided    Mobility  Bed Mobility Bed Mobility: Supine to Sit Supine to Sit: 4: Min assist Details for Bed Mobility Assistance: cues for sequence and use of L LE to self assist Transfers Transfers: Sit to Stand;Stand to Sit Sit to Stand: 4: Min guard;From chair/3-in-1 Stand to Sit: 4: Min assist Details for Transfer Assistance: cues for use of UEs and LE management. Ambulation/Gait Ambulation/Gait Assistance: 4: Min assist Ambulation Distance (Feet): 98 Feet Assistive device: Rolling walker Ambulation/Gait Assistance Details: cues for posture, stride length and position from RW Gait Pattern: Step-to pattern    Exercises Total Joint Exercises Ankle Circles/Pumps: AROM;Supine;Both;20 reps Quad Sets: Supine;AROM;Both;20 reps Heel Slides: AAROM;Supine;Right;20 reps Straight Leg Raises: AAROM;Supine;Right;20 reps   PT Diagnosis:    PT Problem List:   PT Treatment Interventions:     PT Goals Acute Rehab PT Goals PT Goal Formulation: With patient Time For Goal Achievement: 05/28/12 Potential to Achieve Goals: Good Pt will go Supine/Side to Sit: with  supervision PT Goal: Supine/Side to Sit - Progress: Progressing toward goal Pt will go Sit to Supine/Side: with supervision PT Goal: Sit to Supine/Side - Progress: Progressing toward goal Pt will go Sit to Stand: with supervision PT Goal: Sit to Stand - Progress: Progressing toward goal Pt will go Stand to Sit: with supervision PT Goal: Stand to Sit - Progress: Progressing toward goal Pt will Ambulate: 51 - 150 feet;with supervision;with rolling walker PT Goal: Ambulate - Progress: Progressing toward goal  Visit Information  Last PT Received On: 05/22/12 Assistance Needed: +1    Subjective Data  Subjective: My back is bothering me almost as much as my knee Patient Stated Goal: This knee needs to bend better than the other one   Cognition  Overall Cognitive Status: Appears within functional limits for tasks assessed/performed Arousal/Alertness: Awake/alert Orientation Level: Appears intact for tasks assessed Behavior During Session: [facility] for tasks performed    Balance     End of Session PT - End of Session Equipment Utilized During Treatment: Right knee immobilizer Activity Tolerance: Patient tolerated treatment well Patient left: with call bell/phone within reach;in chair Nurse Communication: Mobility status   GP     Alina Gilkey 05/22/2012, 12:30 PM

## 2012-05-22 NOTE — Progress Notes (Signed)
   Subjective: 2 Days Post-Op Procedure(s) (LRB): TOTAL KNEE ARTHROPLASTY (Right) Patient reports pain as mild.   Patient seen in rounds for Dr. Lequita Halt. Patient is well, but has had some minor complaints of pain in the knee, requiring pain medications and itching. Plan is to go Skilled nursing facility after hospital stay.  Objective: Vital signs in last 24 hours: Temp:  [97.6 F (36.4 C)-99 F (37.2 C)] 99 F (37.2 C) (09/11 0537) Pulse Rate:  [70-85] 85  (09/11 0537) Resp:  [14-16] 16  (09/11 0537) BP: (145-172)/(66-84) 172/84 mmHg (09/11 0537) SpO2:  [95 %-100 %] 95 % (09/11 0537)  Intake/Output from previous day:  Intake/Output Summary (Last 24 hours) at 05/22/12 0805 Last data filed at 05/22/12 0758  Gross per 24 hour  Intake    435 ml  Output   1700 ml  Net  -1265 ml    Intake/Output this shift: Total I/O In: 120 [P.O.:120] Out: 200 [Urine:200]  Labs:  [facility] 05/22/12 0415 05/21/12 0405  HGB 12.5 13.1    Basename 05/22/12 0415 05/21/12 0405  WBC 11.8* 12.7*  RBC 4.08 4.38  HCT 37.6 40.1  PLT 260 262    Basename 05/22/12 0415 05/21/12 0405  NA 134* 136  K 4.0 3.9  CL 99 100  CO2 26 25  BUN 7 10  CREATININE 0.77 0.85  GLUCOSE 123* 168*  CALCIUM 9.4 8.9   No results found for this basename: LABPT:2,INR:2 in the last 72 hours  EXAM General - Patient is Alert, Appropriate and Oriented Extremity - Neurovascular intact Sensation intact distally Dorsiflexion/Plantar flexion intact No cellulitis present Dressing/Incision - clean, dry, no drainage, healing Motor Function - intact, moving foot and toes well on exam.   Past Medical History  Diagnosis Date  . History of elevated homocysteine   . Thyroid nodule     rt benign colloid nodule--left hyperplastic nodule- Thyroid bx 2006: neg  . Baker's cyst   . Goiter   . Adenomatous colon polyp 03/2003  . PONV (postoperative nausea and vomiting)   . Anxiety     ativan for sleep  . OA  (osteoarthritis)      knee pain after TKR left , back pain- last steroid injection back 2 months ago Dr Ethelene Hal  . Hypertension 09/11/2005    Assessment/Plan: 2 Days Post-Op Procedure(s) (LRB): TOTAL KNEE ARTHROPLASTY (Right) Principal Problem:  *OA (osteoarthritis) of knee   Up with therapy Plan for discharge tomorrow Discharge to SNF - Looking into [facility]  DVT Prophylaxis - Xarelto Weight-Bearing as tolerated to right leg K-Pad for her back which is sore today.  PERKINS, ALEXZANDREW 05/22/2012, 8:05 AM

## 2012-05-22 NOTE — Evaluation (Signed)
Occupational Therapy Evaluation Patient Details Name: [name] MRN: [id number] DOB: 09-May-1941 Today's Date: 05/22/2012 Time: 4098-1191 OT Time Calculation (min): 18 min  OT Assessment / Plan / Recommendation Clinical Impression  This 71 year old female was admitted for L TKA.  She will benefit from continued OT to increase independence with adls with min guard to min A level goals for adls in acute    OT Assessment  Patient needs continued OT Services    Follow Up Recommendations  Skilled nursing facility    Barriers to Discharge      Equipment Recommendations  Defer to next venue    Recommendations for Other Services    Frequency  Min 2X/week    Precautions / Restrictions Precautions Required Braces or Orthoses: Knee Immobilizer - Right Knee Immobilizer - Right: Discontinue once straight leg raise with < 10 degree lag Restrictions Other Position/Activity Restrictions: WBAT   Pertinent Vitals/Pain R knee and back sore:  Repositioned and ice applied    ADL  Eating/Feeding: Simulated;Independent Where Assessed - Eating/Feeding: Chair Grooming: Performed;Wash/dry hands;Set up Where Assessed - Grooming: Unsupported sitting Upper Body Bathing: Simulated;Set up Where Assessed - Upper Body Bathing: Supported sitting Lower Body Bathing: Simulated;Moderate assistance Where Assessed - Lower Body Bathing: Supported sit to stand Upper Body Dressing: Simulated;Set up Where Assessed - Upper Body Dressing: Supported sitting Lower Body Dressing: Simulated;Maximal assistance Where Assessed - Lower Body Dressing: Supported sit to stand Toilet Transfer: Performed;Min guard (min cues) Statistician Method: Sit to Barista: Bedside commode Toileting - Clothing Manipulation and Hygiene: Simulated;Minimal assistance (underwear stuck on KI, min guard for hygiene) Where Assessed - Toileting Clothing Manipulation and Hygiene: Sit to stand from 3-in-1 or  toilet Equipment Used: Rolling walker Transfers/Ambulation Related to ADLs: min guard ambulating in room.  Has back pain premorbidly:  decided to sit in chair for awhile ADL Comments: has reacher.  Educated on AE uses for adls, and demonstrated sock aid but pt did not practice today    OT Diagnosis: Generalized weakness  OT Problem List: Decreased strength;Decreased activity tolerance;Pain;Decreased knowledge of use of DME or AE OT Treatment Interventions: Self-care/ADL training;DME and/or AE instruction;Patient/family education   OT Goals Acute Rehab OT Goals OT Goal Formulation: With patient Time For Goal Achievement: 05/29/12 Potential to Achieve Goals: Good ADL Goals Pt Will Perform Grooming: with supervision;Standing at sink;Supported ADL Goal: Grooming - Progress: Goal set today Pt Will Perform Lower Body Bathing: with min assist;Sit to stand from chair;with adaptive equipment ADL Goal: Lower Body Bathing - Progress: Goal set today Pt Will Perform Lower Body Dressing: with min assist;Sit to stand from chair;with adaptive equipment ADL Goal: Lower Body Dressing - Progress: Goal set today Pt Will Transfer to Toilet: with supervision;Ambulation;3-in-1 ADL Goal: Toilet Transfer - Progress: Goal set today Pt Will Perform Toileting - Clothing Manipulation: with supervision;Standing ADL Goal: Toileting - Clothing Manipulation - Progress: Goal set today Pt Will Perform Toileting - Hygiene: with supervision;Sit to stand from 3-in-1/toilet ADL Goal: Toileting - Hygiene - Progress: Goal set today  Visit Information  Last OT Received On: 05/22/12 Assistance Needed: +1    Subjective Data  Subjective: I always need to use the bathroom Patient Stated Goal: [place] then home   Prior Functioning  Vision/Perception  Home Living Lives With: Spouse Prior Function Level of Independence: Independent Communication Communication: No difficulties Dominant Hand: Right       Cognition  Overall Cognitive Status: Appears within functional limits for tasks assessed/performed Arousal/Alertness:  Awake/alert Orientation Level: Appears intact for tasks assessed Behavior During Session: [facility] for tasks performed    Extremity/Trunk Assessment Right Upper Extremity Assessment RUE ROM/Strength/Tone: Within functional levels (h/o bil shoulder replacements:  R RTSA) Left Upper Extremity Assessment LUE ROM/Strength/Tone: Within functional levels   Mobility  Shoulder Instructions  Transfers Sit to Stand: 4: Min guard;From chair/3-in-1       Exercise     Balance     End of Session OT - End of Session Activity Tolerance: Patient tolerated treatment well Patient left: in chair;with call bell/phone within reach CPM Right Knee CPM Right Knee: Off  GO     Jasmain Ahlberg 05/22/2012, 10:16 AM Marica Otter, OTR/L [phone] 05/22/2012

## 2012-05-23 DIAGNOSIS — M25569 Pain in unspecified knee: Secondary | ICD-10-CM | POA: Diagnosis not present

## 2012-05-23 DIAGNOSIS — IMO0002 Reserved for concepts with insufficient information to code with codable children: Secondary | ICD-10-CM | POA: Diagnosis not present

## 2012-05-23 DIAGNOSIS — M1710 Unilateral primary osteoarthritis, unspecified knee: Secondary | ICD-10-CM | POA: Diagnosis not present

## 2012-05-23 DIAGNOSIS — D62 Acute posthemorrhagic anemia: Secondary | ICD-10-CM | POA: Diagnosis not present

## 2012-05-23 DIAGNOSIS — I10 Essential (primary) hypertension: Secondary | ICD-10-CM | POA: Diagnosis not present

## 2012-05-23 DIAGNOSIS — E871 Hypo-osmolality and hyponatremia: Secondary | ICD-10-CM | POA: Diagnosis not present

## 2012-05-23 DIAGNOSIS — K5900 Constipation, unspecified: Secondary | ICD-10-CM | POA: Diagnosis not present

## 2012-05-23 DIAGNOSIS — F411 Generalized anxiety disorder: Secondary | ICD-10-CM | POA: Diagnosis not present

## 2012-05-23 DIAGNOSIS — Z5189 Encounter for other specified aftercare: Secondary | ICD-10-CM | POA: Diagnosis not present

## 2012-05-23 DIAGNOSIS — K219 Gastro-esophageal reflux disease without esophagitis: Secondary | ICD-10-CM | POA: Diagnosis not present

## 2012-05-23 DIAGNOSIS — Z96659 Presence of unspecified artificial knee joint: Secondary | ICD-10-CM | POA: Diagnosis not present

## 2012-05-23 DIAGNOSIS — S8990XA Unspecified injury of unspecified lower leg, initial encounter: Secondary | ICD-10-CM | POA: Diagnosis not present

## 2012-05-23 LAB — BASIC METABOLIC PANEL
BUN: 10 mg/dL (ref 6–23)
CO2: 27 mEq/L (ref 19–32)
Calcium: 8.7 mg/dL (ref 8.4–10.5)
Chloride: 98 mEq/L (ref 96–112)
Creatinine, Ser: 0.71 mg/dL (ref 0.50–1.10)
GFR calc Af Amer: >90 mL/min (ref >90)
GFR calc non Af Amer: 85 mL/min — ABNORMAL LOW (ref >90)
Glucose, Bld: 105 mg/dL — ABNORMAL HIGH (ref 70–99)
Potassium: 3.6 mEq/L (ref 3.5–5.1)
Sodium: 135 mEq/L (ref 135–145)

## 2012-05-23 LAB — CBC
HCT: 34.6 % — ABNORMAL LOW (ref 36.0–46.0)
Hemoglobin: 11.5 g/dL — ABNORMAL LOW (ref 12.0–15.0)
MCH: 30.6 pg (ref 26.0–34.0)
MCHC: 33.2 g/dL (ref 30.0–36.0)
MCV: 92 fL (ref 78.0–100.0)
Platelets: 264 10*3/uL (ref 150–400)
RBC: 3.76 MIL/uL — ABNORMAL LOW (ref 3.87–5.11)
RDW: 13.6 % (ref 11.5–15.5)
WBC: 11.4 10*3/uL — ABNORMAL HIGH (ref 4.0–10.5)

## 2012-05-23 MED ORDER — HYDROCODONE-ACETAMINOPHEN 7.5-325 MG PO TABS: 1.0000 | ORAL_TABLET | ORAL | Status: AC | PRN

## 2012-05-23 MED ORDER — RIVAROXABAN 10 MG PO TABS: 10.0000 mg | ORAL_TABLET | Freq: Every day | ORAL | Status: DC

## 2012-05-23 MED ORDER — DSS 100 MG PO CAPS: 100.0000 mg | ORAL_CAPSULE | Freq: Two times a day (BID) | ORAL | Status: AC

## 2012-05-23 MED ORDER — BISACODYL 10 MG RE SUPP: 10.0000 mg | Freq: Every day | RECTAL | Status: AC | PRN

## 2012-05-23 MED ORDER — POLYETHYLENE GLYCOL 3350 17 G PO PACK: 17.0000 g | PACK | Freq: Every day | ORAL | Status: AC | PRN

## 2012-05-23 MED ORDER — ONDANSETRON HCL 4 MG PO TABS: 4.0000 mg | ORAL_TABLET | Freq: Four times a day (QID) | ORAL | Status: AC | PRN

## 2012-05-23 MED ORDER — ACETAMINOPHEN 325 MG PO TABS: 650.0000 mg | ORAL_TABLET | Freq: Four times a day (QID) | ORAL | Status: DC | PRN

## 2012-05-23 MED ORDER — CALCIUM CARBONATE ANTACID 500 MG PO CHEW: 1.0000 | CHEWABLE_TABLET | Freq: Three times a day (TID) | ORAL | Status: DC | PRN

## 2012-05-23 MED ORDER — METHOCARBAMOL 500 MG PO TABS: 500.0000 mg | ORAL_TABLET | Freq: Four times a day (QID) | ORAL | Status: AC | PRN

## 2012-05-23 NOTE — Progress Notes (Signed)
Pt has ST SNF bed at [facility] today. CSW will follow to assist with d/c planning to SNF.  Cori Razor LCSW [phone]

## 2012-05-23 NOTE — Progress Notes (Signed)
Physical Therapy Treatment Patient Details Name: [name] MRN: [id number] DOB: [birth year] Today's Date: 05/23/2012 Time: 3875-6433 PT Time Calculation (min): 48 min  PT Assessment / Plan / Recommendation Comments on Treatment Session       Follow Up Recommendations  Skilled nursing facility    Barriers to Discharge        Equipment Recommendations  Defer to next venue    Recommendations for Other Services OT consult  Frequency 7X/week   Plan Discharge plan remains appropriate    Precautions / Restrictions Precautions Precautions: Knee Required Braces or Orthoses: Knee Immobilizer - Right Knee Immobilizer - Right: Discontinue once straight leg raise with < 10 degree lag Restrictions Weight Bearing Restrictions: No Other Position/Activity Restrictions: WBAT   Pertinent Vitals/Pain 4/10; premedicated, cold packs provided    Mobility  Bed Mobility Bed Mobility: Supine to Sit;Sit to Supine Supine to Sit: 4: Min assist Sit to Supine: 4: Min assist Details for Bed Mobility Assistance: cues for sequence and use of L LE to self assist Transfers Transfers: Sit to Stand;Stand to Sit Sit to Stand: 4: Min guard Stand to Sit: 4: Min guard Details for Transfer Assistance: cues for use of UEs and LE management. Ambulation/Gait Ambulation/Gait Assistance: 4: Min guard Ambulation Distance (Feet): 187 Feet Assistive device: Rolling walker Ambulation/Gait Assistance Details: min cues for posture and position from RW Gait Pattern: Step-to pattern    Exercises Total Joint Exercises Ankle Circles/Pumps: AROM;Supine;Both;20 reps Quad Sets: Supine;AROM;Both;20 reps Heel Slides: AAROM;Supine;Right;20 reps Straight Leg Raises: AAROM;Supine;Right;20 reps   PT Diagnosis:    PT Problem List:   PT Treatment Interventions:     PT Goals Acute Rehab PT Goals PT Goal Formulation: With patient Time For Goal Achievement: 05/28/12 Potential to Achieve Goals: Good Pt will go  Supine/Side to Sit: with supervision PT Goal: Supine/Side to Sit - Progress: Progressing toward goal Pt will go Sit to Supine/Side: with supervision PT Goal: Sit to Supine/Side - Progress: Progressing toward goal Pt will go Sit to Stand: with supervision PT Goal: Sit to Stand - Progress: Progressing toward goal Pt will go Stand to Sit: with supervision PT Goal: Stand to Sit - Progress: Progressing toward goal Pt will Ambulate: 51 - 150 feet;with supervision;with rolling walker PT Goal: Ambulate - Progress: Progressing toward goal  Visit Information  Last PT Received On: 05/23/12 Assistance Needed: +1    Subjective Data  Subjective: Give me my hat, I need to wear my hat today Patient Stated Goal: This knee needs to bend better than the other one   Cognition  Overall Cognitive Status: Appears within functional limits for tasks assessed/performed Arousal/Alertness: Awake/alert Orientation Level: Appears intact for tasks assessed Behavior During Session: [facility] for tasks performed    Balance     End of Session PT - End of Session Equipment Utilized During Treatment: Right knee immobilizer Activity Tolerance: Patient tolerated treatment well Patient left: with call bell/phone within reach;in bed Nurse Communication: Mobility status   GP     Willow Shidler 05/23/2012, 1:09 PM

## 2012-05-23 NOTE — Progress Notes (Signed)
Clinical Social Work Department CLINICAL SOCIAL WORK PLACEMENT NOTE 05/23/2012  Patient:  [name], [name]  Account Number:  [id number] Admit date:  05/20/2012  Clinical Social Worker:  Cori Razor, LCSW  Date/time:  05/21/2012 04:26 PM  Clinical Social Work is seeking post-discharge placement for this patient at the following level of care:   SKILLED NURSING   (*CSW will update this form in Epic as items are completed)   05/21/2012  Patient/family provided with [facility] Department of Clinical Social Work's list of facilities offering this level of care within the geographic area requested by the patient (or if unable, by the patient's family).  05/21/2012  Patient/family informed of their freedom to choose among providers that offer the needed level of care, that participate in Medicare, Medicaid or managed care program needed by the patient, have an available bed and are willing to accept the patient.    Patient/family informed of [facility]' ownership interest in [facility], as well as of the fact that they are under no obligation to receive care at this facility.  PASARR submitted to EDS on 05/21/2012 PASARR number received from [employer] on 05/21/2012  FL2 transmitted to all facilities in geographic area requested by pt/family on  05/21/2012 FL2 transmitted to all facilities within larger geographic area on   Patient informed that his/her managed care company has contracts with or will negotiate with  certain facilities, including the following:     Patient/family informed of bed offers received:  05/21/2012 Patient chooses bed at [facility] Physician recommends and patient chooses bed at    Patient to be transferred to [facility] on  05/23/2012 Patient to be transferred to facility by [facility]  The following physician request were entered in Epic:   Additional Comments:  Cori Razor LCSW [phone]

## 2012-05-23 NOTE — Progress Notes (Signed)
   Subjective: 3 Days Post-Op Procedure(s) (LRB): TOTAL KNEE ARTHROPLASTY (Right) Patient reports pain as mild.   Patient seen in rounds with Dr. Lequita Halt. Patient is well, and has had no acute complaints or problems Patient is ready to go [employer] today.  Objective: Vital signs in last 24 hours: Temp:  [98.3 F (36.8 C)-98.8 F (37.1 C)] 98.6 F (37 C) (09/12 0626) Pulse Rate:  [74-76] 74  (09/12 0626) Resp:  [16-18] 16  (09/12 0626) BP: (135-163)/(73-86) 153/75 mmHg (09/12 0626) SpO2:  [95 %-98 %] 95 % (09/12 0626)  Intake/Output from previous day:  Intake/Output Summary (Last 24 hours) at 05/23/12 0810 Last data filed at 05/23/12 0718  Gross per 24 hour  Intake    660 ml  Output   2050 ml  Net  -1390 ml    Intake/Output this shift: Total I/O In: -  Out: 150 [Urine:150]  Labs:  Basename 05/23/12 0410 05/22/12 0415 05/21/12 0405  HGB 11.5* 12.5 13.1    Basename 05/23/12 0410 05/22/12 0415  WBC 11.4* 11.8*  RBC 3.76* 4.08  HCT 34.6* 37.6  PLT 264 260    Basename 05/23/12 0410 05/22/12 0415  NA 135 134*  K 3.6 4.0  CL 98 99  CO2 27 26  BUN 10 7  CREATININE 0.71 0.77  GLUCOSE 105* 123*  CALCIUM 8.7 9.4   No results found for this basename: LABPT:2,INR:2 in the last 72 hours  EXAM: General - Patient is Alert, Appropriate and Oriented Extremity - Neurovascular intact Sensation intact distally Dorsiflexion/Plantar flexion intact No cellulitis present Incision - clean, dry, no drainage, healing Motor Function - intact, moving foot and toes well on exam.   Assessment/Plan: 3 Days Post-Op Procedure(s) (LRB): TOTAL KNEE ARTHROPLASTY (Right) Procedure(s) (LRB): TOTAL KNEE ARTHROPLASTY (Right) Past Medical History  Diagnosis Date  . History of elevated homocysteine   . Thyroid nodule     rt benign colloid nodule--left hyperplastic nodule- Thyroid bx 2006: neg  . Baker's cyst   . Goiter   . Adenomatous colon polyp 03/2003  . PONV (postoperative  nausea and vomiting)   . Anxiety     ativan for sleep  . OA (osteoarthritis)      knee pain after TKR left , back pain- last steroid injection back 2 months ago Dr Ethelene Hal  . Hypertension 09/11/2005   Principal Problem:  *OA (osteoarthritis) of knee Active Problems:  Postop Hyponatremia  Postop Acute blood loss anemia   Discharge to SNF Diet - Cardiac diet Follow up - in 2 weeks Activity - WBAT Disposition - Skilled nursing facility Condition Upon Discharge - Good D/C Meds - See DC Summary DVT Prophylaxis - Xarelto  Larie Mathes 05/23/2012, 8:10 AM

## 2012-05-23 NOTE — Discharge Summary (Signed)
Physician Discharge Summary   Patient ID: [name] MRN: [id number] DOB/AGE: [birth year] 71 y.o.  Admit date: 05/20/2012 Discharge date: 05/23/2012  Primary Diagnosis: Osteoarthritis Right knee   Admission Diagnoses:  Past Medical History  Diagnosis Date  . History of elevated homocysteine   . Thyroid nodule     rt benign colloid nodule--left hyperplastic nodule- Thyroid bx 2006: neg  . Baker's cyst   . Goiter   . Adenomatous colon polyp 03/2003  . PONV (postoperative nausea and vomiting)   . Anxiety     ativan for sleep  . OA (osteoarthritis)      knee pain after TKR left , back pain- last steroid injection back 2 months ago Dr Ethelene Hal  . Hypertension 09/11/2005   Discharge Diagnoses:   Principal Problem:  *OA (osteoarthritis) of knee Active Problems:  Postop Hyponatremia  Postop Acute blood loss anemia  Procedure:  Procedure(s) (LRB): TOTAL KNEE ARTHROPLASTY (Right)   Consults: None  HPI: [name] is a 71 y.o. year old female with end stage OA of her right knee with progressively worsening pain and dysfunction. She has constant pain, with activity and at rest and significant functional deficits with difficulties even with ADLs. She has had extensive non-op management including analgesics, injections of cortisone and viscosupplements, and home exercise program, but remains in significant pain with significant dysfunction.Radiographs show bone on bone arthritis lateral and patellofemoral. She presents now for right Total Knee Arthroplasty.       Laboratory Data: Hospital Outpatient Visit on 05/14/2012  Component Date Value Range Status  . aPTT 05/14/2012 28  24 - 37 seconds Final  . WBC 05/14/2012 7.1  4.0 - 10.5 K/uL Final  . RBC 05/14/2012 4.56  3.87 - 5.11 MIL/uL Final  . Hemoglobin 05/14/2012 14.4  12.0 - 15.0 g/dL Final  . HCT 40/98/1191 42.1  36.0 - 46.0 % Final  . MCV 05/14/2012 92.3  78.0 - 100.0 fL Final  . MCH 05/14/2012 31.6  26.0 - 34.0 pg Final  .  MCHC 05/14/2012 34.2  30.0 - 36.0 g/dL Final  . RDW 47/82/9562 13.4  11.5 - 15.5 % Final  . Platelets 05/14/2012 301  150 - 400 K/uL Final  . Sodium 05/14/2012 137  135 - 145 mEq/L Final  . Potassium 05/14/2012 4.6  3.5 - 5.1 mEq/L Final  . Chloride 05/14/2012 99  96 - 112 mEq/L Final  . CO2 05/14/2012 30  19 - 32 mEq/L Final  . Glucose, Bld 05/14/2012 91  70 - 99 mg/dL Final  . BUN 13/04/6577 14  6 - 23 mg/dL Final  . Creatinine, Ser 05/14/2012 0.86  0.50 - 1.10 mg/dL Final  . Calcium 46/96/2952 10.0  8.4 - 10.5 mg/dL Final  . Total Protein 05/14/2012 6.5  6.0 - 8.3 g/dL Final  . Albumin 84/13/2440 4.2  3.5 - 5.2 g/dL Final  . AST 07/08/2535 25  0 - 37 U/L Final  . ALT 05/14/2012 13  0 - 35 U/L Final  . Alkaline Phosphatase 05/14/2012 76  39 - 117 U/L Final  . Total Bilirubin 05/14/2012 0.4  0.3 - 1.2 mg/dL Final  . GFR calc non Af Amer 05/14/2012 66* >90 mL/min Final  . GFR calc Af Amer 05/14/2012 77* >90 mL/min Final   Comment:                                 The eGFR has  been calculated                          using the CKD EPI equation.                          This calculation has not been                          validated in all clinical                          situations.                          eGFR's persistently                          <90 mL/min signify                          possible Chronic Kidney Disease.  Marland Kitchen Prothrombin Time 05/14/2012 13.1  11.6 - 15.2 seconds Final  . INR 05/14/2012 0.97  0.00 - 1.49 Final  . Color, Urine 05/14/2012 YELLOW  YELLOW Final  . APPearance 05/14/2012 CLEAR  CLEAR Final  . Specific Gravity, Urine 05/14/2012 1.008  1.005 - 1.030 Final  . pH 05/14/2012 7.5  5.0 - 8.0 Final  . Glucose, UA 05/14/2012 NEGATIVE  NEGATIVE mg/dL Final  . Hgb urine dipstick 05/14/2012 NEGATIVE  NEGATIVE Final  . Bilirubin Urine 05/14/2012 NEGATIVE  NEGATIVE Final  . Ketones, ur 05/14/2012 NEGATIVE  NEGATIVE mg/dL Final  . Protein, ur 78/29/5621 NEGATIVE   NEGATIVE mg/dL Final  . Urobilinogen, UA 05/14/2012 0.2  0.0 - 1.0 mg/dL Final  . Nitrite 30/86/5784 NEGATIVE  NEGATIVE Final  . Leukocytes, UA 05/14/2012 NEGATIVE  NEGATIVE Final   MICROSCOPIC NOT DONE ON URINES WITH NEGATIVE PROTEIN, BLOOD, LEUKOCYTES, NITRITE, OR GLUCOSE <1000 mg/dL.  Marland Kitchen MRSA, PCR 05/14/2012 NEGATIVE  NEGATIVE Final  . Staphylococcus aureus 05/14/2012 NEGATIVE  NEGATIVE Final   Comment:                                 The Xpert SA Assay (FDA                          approved for NASAL specimens                          in patients over 71 years of age),                          is one component of                          a comprehensive surveillance                          program.  Test performance has                          been validated by [employer]  [facility] for patients greater                          than or equal to 71 year old.                          It is not intended                          to diagnose infection nor to                          guide or monitor treatment.    Basename 05/23/12 0410 05/22/12 0415 05/21/12 0405  HGB 11.5* 12.5 13.1    Basename 05/23/12 0410 05/22/12 0415  WBC 11.4* 11.8*  RBC 3.76* 4.08  HCT 34.6* 37.6  PLT 264 260    Basename 05/23/12 0410 05/22/12 0415  NA 135 134*  K 3.6 4.0  CL 98 99  CO2 27 26  BUN 10 7  CREATININE 0.71 0.77  GLUCOSE 105* 123*  CALCIUM 8.7 9.4   No results found for this basename: LABPT:2,INR:2 in the last 72 hours  X-Rays:No results found.  EKG: Orders placed in visit on 03/04/12  . EKG 12-LEAD     Hospital Course: Patient was admitted to [facility] and taken to the OR and underwent the above state procedure without complications.  Patient tolerated the procedure well and was later transferred to the recovery room and then to the orthopaedic floor for postoperative care.  They were given PO and IV analgesics for pain control following their surgery.   They were given 24 hours of postoperative antibiotics and started on DVT prophylaxis in the form of Xarelto.   PT and OT were ordered for total joint protocol.  Discharge planning consulted to help with postop disposition and equipment needs.  Patient had a rough night on the evening of surgery and started to get up OOB with therapy on day one.  PCA Morphine was discontinued and they were weaned over to PO meds. Did have some itching that night also.  Hemovac drain was pulled without difficulty.  Continued to work with therapy into day two.  Dressing was changed on day two and the incision was healing well.  By day three, the patient had progressed with therapy and meeting their goals.  Incision was healing well.  Patient was seen in rounds and was ready to go home.  Discharge Medications: Prior to Admission medications   Medication Sig Start Date End Date Taking? Authorizing Provider  Butoconazole Nitrate, 1 Dose, 2 % CREA 1 applicator qhs x up to 6 days 05/15/12  Yes Wanda Plump, MD  cetirizine (ZYRTEC) 10 MG tablet Take 10 mg by mouth as needed.    Yes Historical Provider, MD  lisinopril-hydrochlorothiazide (PRINZIDE,ZESTORETIC) 20-12.5 MG per tablet Take 1 tablet by mouth daily.    Yes Historical Provider, MD  LORazepam (ATIVAN) 0.5 MG tablet Take 0.5 mg by mouth at bedtime as needed. Anxiety/sleep   Yes Historical Provider, MD  omeprazole (PRILOSEC) 40 MG capsule Take 40 mg by mouth as needed. Takes with naprosyn   Yes Historical Provider, MD  acetaminophen (TYLENOL) 325 MG tablet Take 2 tablets (650 mg total) by mouth every 6 (six) hours as needed (or Fever >/= 101). 05/23/12 05/23/13  Alexzandrew Perkins, PA  bisacodyl (DULCOLAX) 10  MG suppository Place 1 suppository (10 mg total) rectally daily as needed. 05/23/12 06/02/12  Alexzandrew Julien Girt, PA  calcium carbonate (TUMS - DOSED IN MG ELEMENTAL CALCIUM) 500 MG chewable tablet Chew 1 tablet (200 mg of elemental calcium total) by mouth 3 (three) times  daily between meals as needed (for indigestion ). 05/23/12 05/23/13  Alexzandrew Perkins, PA  docusate sodium 100 MG CAPS Take 100 mg by mouth 2 (two) times daily. 05/23/12 06/02/12  Alexzandrew Perkins, PA  HYDROcodone-acetaminophen (NORCO) 7.5-325 MG per tablet Take 1 tablet by mouth every 4 (four) hours as needed. 05/23/12 06/02/12  Alexzandrew Perkins, PA  hydrocortisone 2.5 % cream Apply 1 application topically as needed. To rash on buttocks    Historical Provider, MD  methocarbamol (ROBAXIN) 500 MG tablet Take 1 tablet (500 mg total) by mouth every 6 (six) hours as needed. 05/23/12 06/02/12  Alexzandrew Perkins, PA  ondansetron (ZOFRAN) 4 MG tablet Take 1 tablet (4 mg total) by mouth every 6 (six) hours as needed for nausea. 05/23/12 05/30/12  Alexzandrew Perkins, PA  polyethylene glycol (MIRALAX / GLYCOLAX) packet Take 17 g by mouth daily as needed. 05/23/12 05/26/12  Alexzandrew Julien Girt, PA  rivaroxaban (XARELTO) 10 MG TABS tablet Take 1 tablet (10 mg total) by mouth daily with breakfast. Take Xarelto for two and a half more weeks, then discontinue Xarelto. Once the patient has completed the Xarelto, they may resume the 81 mg Aspirin.  05/23/12   Alexzandrew Julien Girt, PA    Diet: Cardiac diet Activity:WBAT Follow-up:in 2 weeks Disposition - Skilled nursing facility - [facility] Discharged Condition: good   Discharge Orders    Future Orders Please Complete By Expires   Diet - low sodium heart healthy      Call MD / Call 911      Comments:   If you experience chest pain or shortness of breath, CALL 911 and be transported to the hospital emergency room.  If you develope a fever above 101 F, pus (white drainage) or increased drainage or redness at the wound, or calf pain, call your surgeon's office.   Discharge instructions      Comments:   Pick up stool softner and laxative for home. Do not submerge incision under water. May shower. Continue to use ice for pain and swelling from  surgery. Take Xarelto for two and a half more weeks, then discontinue Xarelto. Once the patient has completed the Xarelto, they may resume the 81 mg Aspirin.  When discharged from the skilled rehab facility, please have the facility set up the patient's Home Health Physical Therapy prior to being released.  Also provide the patient with their medications at time of release from the facility to include their pain medication, the muscle relaxants, and their blood thinner medication.  If the patient is still at the rehab facility at time of follow up appointment, please also assist the patient in arranging follow up appointment in our office and any transportation needs.   Constipation Prevention      Comments:   Drink plenty of fluids.  Prune juice may be helpful.  You may use a stool softener, such as Colace (over the counter) 100 mg twice a day.  Use MiraLax (over the counter) for constipation as needed.   Increase activity slowly as tolerated      Patient may shower      Comments:   You may shower without a dressing once there is no drainage.  Do not wash over the wound.  If drainage remains, do not shower until drainage stops.   Driving restrictions      Comments:   No driving until released by the physician.   Lifting restrictions      Comments:   No lifting until released by the physician.   TED hose      Comments:   Use stockings (TED hose) for 3 weeks on both leg(s).  You may remove them at night for sleeping.   Change dressing      Comments:   Change dressing daily with sterile 4 x 4 inch gauze dressing and apply TED hose. Do not submerge the incision under water.   Do not put a pillow under the knee. Place it under the heel.      Do not sit on low chairs, stoools or toilet seats, as it may be difficult to get up from low surfaces          Medication List     As of 05/23/2012  8:16 AM    STOP taking these medications         aspirin EC 81 MG tablet      calcium-vitamin D  500-200 MG-UNIT per tablet   Commonly known as: OSCAL WITH D      HYDROcodone-acetaminophen 5-325 MG per tablet   Commonly known as: NORCO/VICODIN      naproxen 500 MG tablet   Commonly known as: NAPROSYN      TAKE these medications         acetaminophen 325 MG tablet   Commonly known as: TYLENOL   Take 2 tablets (650 mg total) by mouth every 6 (six) hours as needed (or Fever >/= 101).      bisacodyl 10 MG suppository   Commonly known as: DULCOLAX   Place 1 suppository (10 mg total) rectally daily as needed.      Butoconazole Nitrate (1 Dose) 2 % Crea   1 applicator qhs x up to 6 days      calcium carbonate 500 MG chewable tablet   Commonly known as: TUMS - dosed in mg elemental calcium   Chew 1 tablet (200 mg of elemental calcium total) by mouth 3 (three) times daily between meals as needed (for indigestion ).      cetirizine 10 MG tablet   Commonly known as: ZYRTEC   Take 10 mg by mouth as needed.      DSS 100 MG Caps   Take 100 mg by mouth 2 (two) times daily.      HYDROcodone-acetaminophen 7.5-325 MG per tablet   Commonly known as: NORCO   Take 1 tablet by mouth every 4 (four) hours as needed.      hydrocortisone 2.5 % cream   Apply 1 application topically as needed. To rash on buttocks      lisinopril-hydrochlorothiazide 20-12.5 MG per tablet   Commonly known as: PRINZIDE,ZESTORETIC   Take 1 tablet by mouth daily.      LORazepam 0.5 MG tablet   Commonly known as: ATIVAN   Take 0.5 mg by mouth at bedtime as needed. Anxiety/sleep      methocarbamol 500 MG tablet   Commonly known as: ROBAXIN   Take 1 tablet (500 mg total) by mouth every 6 (six) hours as needed.      omeprazole 40 MG capsule   Commonly known as: PRILOSEC   Take 40 mg by mouth as needed. Takes with naprosyn      ondansetron 4 MG tablet  Commonly known as: ZOFRAN   Take 1 tablet (4 mg total) by mouth every 6 (six) hours as needed for nausea.      polyethylene glycol packet   Commonly  known as: MIRALAX / GLYCOLAX   Take 17 g by mouth daily as needed.      rivaroxaban 10 MG Tabs tablet   Commonly known as: XARELTO   Take 1 tablet (10 mg total) by mouth daily with breakfast.           Follow-up Information    Follow up with Loanne Drilling, MD. Schedule an appointment as soon as possible for a visit in 2 weeks.   Contact information:   [facility] [address] [place] [address] [phone]          Signed: Patrica Duel 05/23/2012, 8:16 AM

## 2012-05-29 DIAGNOSIS — M1710 Unilateral primary osteoarthritis, unspecified knee: Secondary | ICD-10-CM | POA: Diagnosis not present

## 2012-05-29 DIAGNOSIS — K219 Gastro-esophageal reflux disease without esophagitis: Secondary | ICD-10-CM | POA: Diagnosis not present

## 2012-05-29 DIAGNOSIS — D62 Acute posthemorrhagic anemia: Secondary | ICD-10-CM | POA: Diagnosis not present

## 2012-05-29 DIAGNOSIS — I10 Essential (primary) hypertension: Secondary | ICD-10-CM | POA: Diagnosis not present

## 2012-06-10 ENCOUNTER — Ambulatory Visit: Payer: Medicare Other | Attending: Orthopedic Surgery | Admitting: Physical Therapy

## 2012-06-10 DIAGNOSIS — IMO0001 Reserved for inherently not codable concepts without codable children: Secondary | ICD-10-CM | POA: Insufficient documentation

## 2012-06-10 DIAGNOSIS — M25669 Stiffness of unspecified knee, not elsewhere classified: Secondary | ICD-10-CM | POA: Insufficient documentation

## 2012-06-10 DIAGNOSIS — R262 Difficulty in walking, not elsewhere classified: Secondary | ICD-10-CM | POA: Diagnosis not present

## 2012-06-10 DIAGNOSIS — M25569 Pain in unspecified knee: Secondary | ICD-10-CM | POA: Insufficient documentation

## 2012-06-12 ENCOUNTER — Ambulatory Visit: Payer: Medicare Other | Attending: Orthopedic Surgery | Admitting: Physical Therapy

## 2012-06-12 DIAGNOSIS — M25669 Stiffness of unspecified knee, not elsewhere classified: Secondary | ICD-10-CM | POA: Diagnosis not present

## 2012-06-12 DIAGNOSIS — M25569 Pain in unspecified knee: Secondary | ICD-10-CM | POA: Insufficient documentation

## 2012-06-12 DIAGNOSIS — R262 Difficulty in walking, not elsewhere classified: Secondary | ICD-10-CM | POA: Diagnosis not present

## 2012-06-12 DIAGNOSIS — IMO0001 Reserved for inherently not codable concepts without codable children: Secondary | ICD-10-CM | POA: Diagnosis not present

## 2012-06-14 ENCOUNTER — Ambulatory Visit: Payer: Medicare Other | Admitting: Physical Therapy

## 2012-06-17 ENCOUNTER — Ambulatory Visit: Payer: Medicare Other | Admitting: Physical Therapy

## 2012-06-19 ENCOUNTER — Ambulatory Visit: Payer: Medicare Other | Admitting: Physical Therapy

## 2012-06-21 ENCOUNTER — Ambulatory Visit: Payer: Medicare Other | Admitting: Physical Therapy

## 2012-06-24 ENCOUNTER — Ambulatory Visit: Payer: Medicare Other | Admitting: Physical Therapy

## 2012-06-26 ENCOUNTER — Ambulatory Visit: Payer: Medicare Other | Admitting: Physical Therapy

## 2012-06-27 DIAGNOSIS — M1710 Unilateral primary osteoarthritis, unspecified knee: Secondary | ICD-10-CM | POA: Diagnosis not present

## 2012-06-28 ENCOUNTER — Ambulatory Visit: Payer: Medicare Other | Admitting: Physical Therapy

## 2012-07-01 ENCOUNTER — Ambulatory Visit: Payer: Medicare Other | Admitting: Physical Therapy

## 2012-07-02 ENCOUNTER — Ambulatory Visit: Payer: Medicare Other | Admitting: Physical Therapy

## 2012-07-03 ENCOUNTER — Ambulatory Visit: Payer: Medicare Other | Admitting: Physical Therapy

## 2012-07-05 ENCOUNTER — Ambulatory Visit: Payer: Medicare Other | Admitting: Physical Therapy

## 2012-07-08 ENCOUNTER — Ambulatory Visit: Payer: Medicare Other | Admitting: Physical Therapy

## 2012-07-09 DIAGNOSIS — Z23 Encounter for immunization: Secondary | ICD-10-CM | POA: Diagnosis not present

## 2012-07-10 ENCOUNTER — Ambulatory Visit: Payer: Medicare Other | Admitting: Physical Therapy

## 2012-07-11 ENCOUNTER — Ambulatory Visit: Payer: Medicare Other | Admitting: Physical Therapy

## 2012-07-15 ENCOUNTER — Ambulatory Visit: Payer: Medicare Other | Attending: Orthopedic Surgery | Admitting: Physical Therapy

## 2012-07-15 DIAGNOSIS — M25669 Stiffness of unspecified knee, not elsewhere classified: Secondary | ICD-10-CM | POA: Insufficient documentation

## 2012-07-15 DIAGNOSIS — IMO0001 Reserved for inherently not codable concepts without codable children: Secondary | ICD-10-CM | POA: Insufficient documentation

## 2012-07-15 DIAGNOSIS — R262 Difficulty in walking, not elsewhere classified: Secondary | ICD-10-CM | POA: Diagnosis not present

## 2012-07-15 DIAGNOSIS — M25569 Pain in unspecified knee: Secondary | ICD-10-CM | POA: Insufficient documentation

## 2012-07-16 DIAGNOSIS — G894 Chronic pain syndrome: Secondary | ICD-10-CM | POA: Diagnosis not present

## 2012-07-16 DIAGNOSIS — M5137 Other intervertebral disc degeneration, lumbosacral region: Secondary | ICD-10-CM | POA: Diagnosis not present

## 2012-07-17 ENCOUNTER — Ambulatory Visit: Payer: Medicare Other | Admitting: Physical Therapy

## 2012-07-19 ENCOUNTER — Ambulatory Visit: Payer: Medicare Other | Admitting: Physical Therapy

## 2012-07-22 ENCOUNTER — Ambulatory Visit: Payer: Medicare Other | Admitting: Physical Therapy

## 2012-07-23 DIAGNOSIS — M47817 Spondylosis without myelopathy or radiculopathy, lumbosacral region: Secondary | ICD-10-CM | POA: Diagnosis not present

## 2012-07-24 ENCOUNTER — Ambulatory Visit: Payer: Medicare Other | Admitting: Physical Therapy

## 2012-07-26 ENCOUNTER — Ambulatory Visit: Payer: Medicare Other | Admitting: Physical Therapy

## 2012-07-29 ENCOUNTER — Ambulatory Visit: Payer: Medicare Other | Admitting: Physical Therapy

## 2012-07-31 ENCOUNTER — Ambulatory Visit: Payer: Medicare Other | Admitting: Physical Therapy

## 2012-08-02 ENCOUNTER — Ambulatory Visit: Payer: Medicare Other | Admitting: Physical Therapy

## 2012-08-05 ENCOUNTER — Ambulatory Visit: Payer: Medicare Other | Admitting: Physical Therapy

## 2012-08-06 DIAGNOSIS — Z96659 Presence of unspecified artificial knee joint: Secondary | ICD-10-CM | POA: Diagnosis not present

## 2012-08-07 ENCOUNTER — Ambulatory Visit: Payer: Medicare Other

## 2012-08-09 ENCOUNTER — Other Ambulatory Visit: Payer: Self-pay | Admitting: Internal Medicine

## 2012-08-09 NOTE — Telephone Encounter (Signed)
Refill done.  

## 2012-08-12 ENCOUNTER — Ambulatory Visit: Payer: Medicare Other | Attending: Orthopedic Surgery | Admitting: Physical Therapy

## 2012-08-12 DIAGNOSIS — IMO0001 Reserved for inherently not codable concepts without codable children: Secondary | ICD-10-CM | POA: Diagnosis not present

## 2012-08-12 DIAGNOSIS — M25669 Stiffness of unspecified knee, not elsewhere classified: Secondary | ICD-10-CM | POA: Diagnosis not present

## 2012-08-12 DIAGNOSIS — R262 Difficulty in walking, not elsewhere classified: Secondary | ICD-10-CM | POA: Diagnosis not present

## 2012-08-12 DIAGNOSIS — M25569 Pain in unspecified knee: Secondary | ICD-10-CM | POA: Diagnosis not present

## 2012-08-14 ENCOUNTER — Ambulatory Visit: Payer: Medicare Other | Admitting: Physical Therapy

## 2012-08-16 ENCOUNTER — Ambulatory Visit: Payer: Medicare Other | Admitting: Physical Therapy

## 2012-08-19 ENCOUNTER — Ambulatory Visit: Payer: Medicare Other | Admitting: Physical Therapy

## 2012-08-20 ENCOUNTER — Other Ambulatory Visit: Payer: Self-pay | Admitting: Psychiatry

## 2012-08-20 DIAGNOSIS — M810 Age-related osteoporosis without current pathological fracture: Secondary | ICD-10-CM

## 2012-08-20 DIAGNOSIS — M545 Low back pain, unspecified: Secondary | ICD-10-CM | POA: Diagnosis not present

## 2012-08-20 DIAGNOSIS — M47817 Spondylosis without myelopathy or radiculopathy, lumbosacral region: Secondary | ICD-10-CM | POA: Diagnosis not present

## 2012-08-20 DIAGNOSIS — M48062 Spinal stenosis, lumbar region with neurogenic claudication: Secondary | ICD-10-CM | POA: Diagnosis not present

## 2012-08-20 DIAGNOSIS — M4120 Other idiopathic scoliosis, site unspecified: Secondary | ICD-10-CM | POA: Diagnosis not present

## 2012-08-20 DIAGNOSIS — M48061 Spinal stenosis, lumbar region without neurogenic claudication: Secondary | ICD-10-CM | POA: Insufficient documentation

## 2012-08-20 DIAGNOSIS — Z780 Asymptomatic menopausal state: Secondary | ICD-10-CM

## 2012-08-20 DIAGNOSIS — M5137 Other intervertebral disc degeneration, lumbosacral region: Secondary | ICD-10-CM | POA: Diagnosis not present

## 2012-08-21 ENCOUNTER — Ambulatory Visit: Payer: Medicare Other | Admitting: Physical Therapy

## 2012-08-23 ENCOUNTER — Ambulatory Visit: Payer: Medicare Other | Admitting: Physical Therapy

## 2012-08-23 ENCOUNTER — Ambulatory Visit
Admission: RE | Admit: 2012-08-23 | Discharge: 2012-08-23 | Disposition: A | Discharge status: Home / Self Care | Payer: Medicare Other | Source: Ambulatory Visit | Attending: Psychiatry | Admitting: Psychiatry

## 2012-08-23 DIAGNOSIS — Z780 Asymptomatic menopausal state: Secondary | ICD-10-CM

## 2012-08-23 DIAGNOSIS — M810 Age-related osteoporosis without current pathological fracture: Secondary | ICD-10-CM

## 2012-08-24 DIAGNOSIS — Z96659 Presence of unspecified artificial knee joint: Secondary | ICD-10-CM | POA: Diagnosis not present

## 2012-08-27 DIAGNOSIS — Z96649 Presence of unspecified artificial hip joint: Secondary | ICD-10-CM | POA: Diagnosis not present

## 2012-08-27 DIAGNOSIS — M4804 Spinal stenosis, thoracic region: Secondary | ICD-10-CM | POA: Diagnosis not present

## 2012-08-27 DIAGNOSIS — M48062 Spinal stenosis, lumbar region with neurogenic claudication: Secondary | ICD-10-CM | POA: Diagnosis not present

## 2012-08-28 ENCOUNTER — Telehealth: Payer: Self-pay | Admitting: Internal Medicine

## 2012-08-28 NOTE — Telephone Encounter (Signed)
Appointment Scheduled:  08/29/2012 11:15:00  Appointment Scheduled Provider:  [name]

## 2012-08-28 NOTE — Telephone Encounter (Signed)
Patient Information:  Caller Name: Saryna  Phone: [phone]  Patient: [name], [name]  Gender: Female  DOB: October 17, 1940  Age: 71 Years  PCP: [name]  Office Follow Up:  Does the office need to follow up with this patient?: Yes  Instructions For The Office: Would like a workin appt. anytime this afternoon if at all possible.   Symptoms  Reason For Call & Symptoms: Sinus pain and pressure with dizziness  Reviewed Health History In EMR: Yes  Reviewed Medications In EMR: Yes  Reviewed Allergies In EMR: Yes  Reviewed Surgeries / Procedures: Yes  Date of Onset of Symptoms: 08/23/2012  Treatments Tried: OTC sinus medications  Treatments Tried Worked: No  Guideline(s) Used:  Sinus Pain and Congestion  Disposition Per Guideline:   See Today or Tomorrow in Office  Reason For Disposition Reached:   Using nasal washes and pain medicine > 24 hours and sinus pain (lower forehead, cheekbone, or eye) persists  Advice Given:  N/A  Appointment Scheduled:  08/29/2012 11:15:00 Appointment Scheduled Provider:  [name]

## 2012-08-29 ENCOUNTER — Encounter: Payer: Self-pay | Admitting: Internal Medicine

## 2012-08-29 ENCOUNTER — Ambulatory Visit (INDEPENDENT_AMBULATORY_CARE_PROVIDER_SITE_OTHER): Payer: Medicare Other | Admitting: Internal Medicine

## 2012-08-29 VITALS — BP 118/78 | HR 74 | Temp 98.0°F | Wt 150.0 lb

## 2012-08-29 DIAGNOSIS — J329 Chronic sinusitis, unspecified: Secondary | ICD-10-CM | POA: Diagnosis not present

## 2012-08-29 DIAGNOSIS — R634 Abnormal weight loss: Secondary | ICD-10-CM | POA: Diagnosis not present

## 2012-08-29 DIAGNOSIS — I10 Essential (primary) hypertension: Secondary | ICD-10-CM | POA: Diagnosis not present

## 2012-08-29 LAB — BASIC METABOLIC PANEL
BUN: 15 mg/dL (ref 6–23)
CO2: 27 mEq/L (ref 19–32)
Calcium: 9.7 mg/dL (ref 8.4–10.5)
Chloride: 100 mEq/L (ref 96–112)
Creatinine, Ser: 0.8 mg/dL (ref 0.4–1.2)
GFR: 73.93 mL/min (ref >60.00)
Glucose, Bld: 83 mg/dL (ref 70–99)
Potassium: 3.8 mEq/L (ref 3.5–5.1)
Sodium: 138 mEq/L (ref 135–145)

## 2012-08-29 LAB — CBC WITH DIFFERENTIAL/PLATELET
Basophils Absolute: 0.1 10*3/uL (ref 0.0–0.1)
Basophils Relative: 0.8 % (ref 0.0–3.0)
Eosinophils Absolute: 0.1 10*3/uL (ref 0.0–0.7)
Eosinophils Relative: 1.4 % (ref 0.0–5.0)
HCT: 39.2 % (ref 36.0–46.0)
Hemoglobin: 13.2 g/dL (ref 12.0–15.0)
Lymphocytes Relative: 28.4 % (ref 12.0–46.0)
Lymphs Abs: 2.2 10*3/uL (ref 0.7–4.0)
MCHC: 33.7 g/dL (ref 30.0–36.0)
MCV: 87.3 fl (ref 78.0–100.0)
Monocytes Absolute: 0.5 10*3/uL (ref 0.1–1.0)
Monocytes Relative: 6.5 % (ref 3.0–12.0)
Neutro Abs: 4.9 10*3/uL (ref 1.4–7.7)
Neutrophils Relative %: 62.9 % (ref 43.0–77.0)
Platelets: 339 10*3/uL (ref 150.0–400.0)
RBC: 4.49 Mil/uL (ref 3.87–5.11)
RDW: 14.7 % — ABNORMAL HIGH (ref 11.5–14.6)
WBC: 7.7 10*3/uL (ref 4.5–10.5)

## 2012-08-29 LAB — TSH: TSH: 0.78 u[IU]/mL (ref 0.35–5.50)

## 2012-08-29 MED ORDER — LORAZEPAM 0.5 MG PO TABS: 0.5000 mg | ORAL_TABLET | Freq: Every evening | ORAL | Status: DC | PRN

## 2012-08-29 MED ORDER — FLUTICASONE PROPIONATE 50 MCG/ACT NA SUSP: 2.0000 | Freq: Every day | NASAL | Status: DC

## 2012-08-29 MED ORDER — AMOXICILLIN 500 MG PO CAPS: 1000.0000 mg | ORAL_CAPSULE | Freq: Two times a day (BID) | ORAL | Status: DC

## 2012-08-29 NOTE — Progress Notes (Signed)
  Subjective:    Patient ID: [name], female    DOB: [birth year], 71 y.o.   MRN: [id number]  HPI Acute visit One-week history of sinus pain and congestion, some help with nasal spray. She also tried afrin but that seems to be making the symptoms worse. Status post right total knee replacement 05-2012, since then she has been losing weight, I review the wt log and in fact she has lost about 20 pounds. See review of systems and assessment and plan. Hypertension, good medication compliance. In the last few months has developed back pain, reports it is severe, to have surgery at [facility] next month.    Past Medical History  Diagnosis Date  . History of elevated homocysteine   . Thyroid nodule     rt benign colloid nodule--left hyperplastic nodule- Thyroid bx 2006: neg  . Baker's cyst   . Goiter   . Adenomatous colon polyp 03/2003  . PONV (postoperative nausea and vomiting)   . Anxiety     ativan for sleep  . OA (osteoarthritis)      knee pain after TKR left , back pain- last steroid injection back 2 months ago Dr Ethelene Hal  . Hypertension 09/11/2005   Past Surgical History  Procedure Date  . Abdominal hysterectomy   . Oophorectomy   . Knee arthroscopy     L   replacement 2007, Dr Earlie Lou, then a manipulation, then redo TKR 11/09 w/ Dr.Alusio. Parcial replacement again 09-2010  . Total hip arthroplasty 3/11    Right   . Facelift   . Incisional hernia repair   . Knee surgery     x 5-  left  . Shoulder surgery     replacements: L 2010, R 2012  . Bakers cystectomy     right knee  . Total knee arthroplasty 05/20/2012    Procedure: TOTAL KNEE ARTHROPLASTY;  Surgeon: Loanne Drilling, MD;  Location: [facility];  Service: Orthopedics;  Laterality: Right;    Social History:   Married, 2 children   Occupation: Retired Runner, broadcasting/film/video   Tobacco--Stopped 1985   Alcohol Use - seldom, wine   Daily Caffeine Use-1 cup daily   Illicit Drug Use - no     Family History: breast ca--no Prostate  ca-- F Colon Polyps-- Brother, Father Colon ca--F CAD-- F CABG at age 64s, Brother  Clotting disorder: Father, brother    Review of Systems No fever or chills. Mild cough with no sputum production, no chest congestion. Reports no night sweats, increase in urination. Appetite is actually decreased. No nausea, vomiting, diarrhea or blood in the stools. No abdominal pain.     Objective:   Physical Exam General -- alert, well-developed, noticeable weight loss compared to last time I saw her.   Neck -- no LADs HEENT -- TMs normal, throat w/o redness, face symmetric and slt tender to palpation at the L>R maxillary area and both frontal sinuses. Nose is slightly congested  Lungs -- normal respiratory effort, no intercostal retractions, no accessory muscle use, and normal breath sounds.   Heart-- normal rate, regular rhythm, no murmur, and no gallop.   Extremities-- no pretibial edema bilaterally  Psych-- Cognition and judgment appear intact. Alert and cooperative with normal attention span and concentration.  not anxious or depressed appearing.       Assessment & Plan:

## 2012-08-29 NOTE — Assessment & Plan Note (Signed)
See instructions

## 2012-08-29 NOTE — Assessment & Plan Note (Addendum)
20 pounds weight loss in the last 3 months since she had knee surgery, patient reports that she has been in rehabilitation, having a lot of pain not only from the knee surgery but also due to a  Back ussue. Planning back surgery next month. Her appetite has been decreased, has not been tolerated the pain  medication well. Has sinusitis today but no ongoing fevers or night sweats. PHQ 9 score 2, no depression. Etiology of the weight loss  not clear, could be related to pain and decreased appetite as the patient believes. Plan: TSH, CBC. See instructions

## 2012-08-29 NOTE — Patient Instructions (Addendum)
Rest, fluids , tylenol For cough, take Mucinex DM twice a day as needed  For congestion use Flonase 2 sprays in each side of the nose until you feel better. Take the antibiotic as prescribed  (Amoxicillin) Call if no better in few days Call anytime if the symptoms are severe ---- Please monitor your weight daily, if you continue loosing weight (5 pounds in the next month) let me know. ---- Next office visit in 3 months

## 2012-08-29 NOTE — Assessment & Plan Note (Signed)
Well-controlled,  check a BMP 

## 2012-09-02 ENCOUNTER — Encounter: Payer: Self-pay | Admitting: *Deleted

## 2012-09-13 ENCOUNTER — Telehealth: Payer: Self-pay | Admitting: Internal Medicine

## 2012-09-13 MED ORDER — FLUTICASONE PROPIONATE 50 MCG/ACT NA SUSP: 2.0000 | Freq: Every day | NASAL | Status: DC

## 2012-09-13 NOTE — Telephone Encounter (Signed)
Left detailed msg on pt's vmail & sent in rx to pharmacy.

## 2012-09-13 NOTE — Telephone Encounter (Signed)
Caller: Taiwana/Patient; Phone: [phone]; Reason for Call: Patient requesting sample of flonase.  Seen in office 08/29/12 and given flonase Rx and antibiotic for sinus infection.  States has been using flonase as directed BID, but has run out.  It is $70 to pay for it, and a refill is not available until 09/30/11, so she states when used as directed it only lasts 2 weeks.  Would like samples if available or Rx for new prescription.  May reach patient at [phone].  Krs/can

## 2012-09-13 NOTE — Telephone Encounter (Signed)
Unfortunately, we don't have samples of Flonase. Flonase is to use 2 sprays in each side of the nose once a day. If we have Nasonex, okay to   give a sample, is also to use 2 sprays in each side of the nose once a day.

## 2012-09-14 ENCOUNTER — Encounter: Payer: Self-pay | Admitting: Family Medicine

## 2012-09-14 ENCOUNTER — Ambulatory Visit (INDEPENDENT_AMBULATORY_CARE_PROVIDER_SITE_OTHER): Payer: Medicare Other | Admitting: Family Medicine

## 2012-09-14 VITALS — BP 120/78 | HR 78 | Temp 98.4°F | Ht 65.0 in | Wt 151.0 lb

## 2012-09-14 DIAGNOSIS — M1990 Unspecified osteoarthritis, unspecified site: Secondary | ICD-10-CM

## 2012-09-14 DIAGNOSIS — J329 Chronic sinusitis, unspecified: Secondary | ICD-10-CM | POA: Diagnosis not present

## 2012-09-14 DIAGNOSIS — K219 Gastro-esophageal reflux disease without esophagitis: Secondary | ICD-10-CM

## 2012-09-14 MED ORDER — NAPROXEN 500 MG PO TABS: 500.0000 mg | ORAL_TABLET | Freq: Two times a day (BID) | ORAL | Status: DC

## 2012-09-14 MED ORDER — CEFUROXIME AXETIL 500 MG PO TABS: 500.0000 mg | ORAL_TABLET | Freq: Two times a day (BID) | ORAL | Status: DC

## 2012-09-14 MED ORDER — OMEPRAZOLE 20 MG PO CPDR: 20.0000 mg | DELAYED_RELEASE_CAPSULE | Freq: Every day | ORAL | Status: DC

## 2012-09-14 NOTE — Patient Instructions (Signed)

## 2012-09-14 NOTE — Progress Notes (Signed)
  Subjective:     [name] is a 72 y.o. female who presents for evaluation of sinus pain. Symptoms include: congestion, facial pain, headaches, nasal congestion and sinus pressure. Onset of symptoms was 3 days ago.--- but never really completely resolved from last visit.   Symptoms have been gradually worsening since that time. Past history is significant for no history of pneumonia or bronchitis. Patient is a non-smoker.  The following portions of the patient's history were reviewed and updated as appropriate: allergies, current medications, past family history, past medical history, past social history, past surgical history and problem list.  Review of Systems Pertinent items are noted in HPI.   Objective:    BP 120/78  Pulse 78  Temp 98.4 F (36.9 C) (Oral)  Ht 5\' 5"  (1.651 m)  Wt 151 lb (68.493 kg)  BMI 25.13 kg/m2  SpO2 97% General appearance: alert, cooperative, appears stated age and no distress Ears: normal TM's and external ear canals both ears Nose: Nares normal. Septum midline. Mucosa normal. No drainage or sinus tenderness., green discharge, moderate congestion, turbinates red, swollen Throat: lips, mucosa, and tongue normal; teeth and gums normal Neck: no adenopathy, supple, symmetrical, trachea midline and thyroid not enlarged, symmetric, no tenderness/mass/nodules Lungs: clear to auscultation bilaterally    Assessment:    Acute bacterial sinusitis.    Plan:    Nasal steroids per medication orders. Antihistamines per medication orders. Ceftin per medication orders.

## 2012-09-27 DIAGNOSIS — Z01818 Encounter for other preprocedural examination: Secondary | ICD-10-CM | POA: Diagnosis not present

## 2012-10-07 DIAGNOSIS — R262 Difficulty in walking, not elsewhere classified: Secondary | ICD-10-CM | POA: Diagnosis not present

## 2012-10-07 DIAGNOSIS — Z96659 Presence of unspecified artificial knee joint: Secondary | ICD-10-CM | POA: Diagnosis not present

## 2012-10-07 DIAGNOSIS — I10 Essential (primary) hypertension: Secondary | ICD-10-CM | POA: Diagnosis not present

## 2012-10-07 DIAGNOSIS — Z96649 Presence of unspecified artificial hip joint: Secondary | ICD-10-CM | POA: Diagnosis not present

## 2012-10-07 DIAGNOSIS — M4180 Other forms of scoliosis, site unspecified: Secondary | ICD-10-CM | POA: Diagnosis not present

## 2012-10-07 DIAGNOSIS — Q762 Congenital spondylolisthesis: Secondary | ICD-10-CM | POA: Diagnosis not present

## 2012-10-07 DIAGNOSIS — M4310 Spondylolisthesis, site unspecified: Secondary | ICD-10-CM | POA: Diagnosis present

## 2012-10-07 DIAGNOSIS — M4150 Other secondary scoliosis, site unspecified: Secondary | ICD-10-CM | POA: Diagnosis not present

## 2012-10-07 DIAGNOSIS — J9819 Other pulmonary collapse: Secondary | ICD-10-CM | POA: Diagnosis not present

## 2012-10-07 DIAGNOSIS — M4804 Spinal stenosis, thoracic region: Secondary | ICD-10-CM | POA: Diagnosis not present

## 2012-10-07 DIAGNOSIS — K219 Gastro-esophageal reflux disease without esophagitis: Secondary | ICD-10-CM | POA: Diagnosis present

## 2012-10-07 DIAGNOSIS — K560 Paralytic ileus: Secondary | ICD-10-CM | POA: Diagnosis not present

## 2012-10-07 DIAGNOSIS — Z4789 Encounter for other orthopedic aftercare: Secondary | ICD-10-CM | POA: Diagnosis not present

## 2012-10-07 DIAGNOSIS — M4120 Other idiopathic scoliosis, site unspecified: Secondary | ICD-10-CM | POA: Diagnosis present

## 2012-10-07 DIAGNOSIS — M6281 Muscle weakness (generalized): Secondary | ICD-10-CM | POA: Diagnosis not present

## 2012-10-07 DIAGNOSIS — E041 Nontoxic single thyroid nodule: Secondary | ICD-10-CM | POA: Diagnosis not present

## 2012-10-07 DIAGNOSIS — R279 Unspecified lack of coordination: Secondary | ICD-10-CM | POA: Diagnosis not present

## 2012-10-07 DIAGNOSIS — R918 Other nonspecific abnormal finding of lung field: Secondary | ICD-10-CM | POA: Diagnosis not present

## 2012-10-07 DIAGNOSIS — M48062 Spinal stenosis, lumbar region with neurogenic claudication: Secondary | ICD-10-CM | POA: Diagnosis present

## 2012-10-07 DIAGNOSIS — Z981 Arthrodesis status: Secondary | ICD-10-CM | POA: Diagnosis not present

## 2012-10-07 DIAGNOSIS — M1990 Unspecified osteoarthritis, unspecified site: Secondary | ICD-10-CM | POA: Diagnosis not present

## 2012-10-10 DIAGNOSIS — R262 Difficulty in walking, not elsewhere classified: Secondary | ICD-10-CM | POA: Diagnosis not present

## 2012-10-10 DIAGNOSIS — K5900 Constipation, unspecified: Secondary | ICD-10-CM | POA: Diagnosis not present

## 2012-10-10 DIAGNOSIS — M6281 Muscle weakness (generalized): Secondary | ICD-10-CM | POA: Diagnosis not present

## 2012-10-10 DIAGNOSIS — M48062 Spinal stenosis, lumbar region with neurogenic claudication: Secondary | ICD-10-CM | POA: Diagnosis not present

## 2012-10-10 DIAGNOSIS — E041 Nontoxic single thyroid nodule: Secondary | ICD-10-CM | POA: Diagnosis not present

## 2012-10-10 DIAGNOSIS — R279 Unspecified lack of coordination: Secondary | ICD-10-CM | POA: Diagnosis not present

## 2012-10-10 DIAGNOSIS — K219 Gastro-esophageal reflux disease without esophagitis: Secondary | ICD-10-CM | POA: Diagnosis not present

## 2012-10-10 DIAGNOSIS — M1990 Unspecified osteoarthritis, unspecified site: Secondary | ICD-10-CM | POA: Diagnosis not present

## 2012-10-10 DIAGNOSIS — Z4789 Encounter for other orthopedic aftercare: Secondary | ICD-10-CM | POA: Diagnosis not present

## 2012-10-10 DIAGNOSIS — I10 Essential (primary) hypertension: Secondary | ICD-10-CM | POA: Diagnosis not present

## 2012-10-10 DIAGNOSIS — M4150 Other secondary scoliosis, site unspecified: Secondary | ICD-10-CM | POA: Diagnosis not present

## 2012-10-15 DIAGNOSIS — K5900 Constipation, unspecified: Secondary | ICD-10-CM | POA: Diagnosis not present

## 2012-10-15 DIAGNOSIS — M48062 Spinal stenosis, lumbar region with neurogenic claudication: Secondary | ICD-10-CM | POA: Diagnosis not present

## 2012-10-15 DIAGNOSIS — I10 Essential (primary) hypertension: Secondary | ICD-10-CM | POA: Diagnosis not present

## 2012-10-15 DIAGNOSIS — K219 Gastro-esophageal reflux disease without esophagitis: Secondary | ICD-10-CM | POA: Diagnosis not present

## 2012-10-28 DIAGNOSIS — G8929 Other chronic pain: Secondary | ICD-10-CM | POA: Diagnosis not present

## 2012-10-28 DIAGNOSIS — M539 Dorsopathy, unspecified: Secondary | ICD-10-CM | POA: Diagnosis not present

## 2012-10-28 DIAGNOSIS — Z981 Arthrodesis status: Secondary | ICD-10-CM | POA: Diagnosis not present

## 2012-10-28 DIAGNOSIS — M159 Polyosteoarthritis, unspecified: Secondary | ICD-10-CM | POA: Diagnosis not present

## 2012-10-28 DIAGNOSIS — Z5189 Encounter for other specified aftercare: Secondary | ICD-10-CM | POA: Diagnosis not present

## 2012-10-28 DIAGNOSIS — I10 Essential (primary) hypertension: Secondary | ICD-10-CM | POA: Diagnosis not present

## 2012-10-29 ENCOUNTER — Telehealth: Payer: Self-pay | Admitting: *Deleted

## 2012-10-29 DIAGNOSIS — Z4789 Encounter for other orthopedic aftercare: Secondary | ICD-10-CM | POA: Diagnosis not present

## 2012-10-29 DIAGNOSIS — M545 Low back pain, unspecified: Secondary | ICD-10-CM | POA: Diagnosis not present

## 2012-10-29 DIAGNOSIS — R269 Unspecified abnormalities of gait and mobility: Secondary | ICD-10-CM | POA: Diagnosis not present

## 2012-10-29 DIAGNOSIS — I10 Essential (primary) hypertension: Secondary | ICD-10-CM | POA: Diagnosis not present

## 2012-10-29 DIAGNOSIS — M6281 Muscle weakness (generalized): Secondary | ICD-10-CM | POA: Diagnosis not present

## 2012-10-29 NOTE — Telephone Encounter (Signed)
Discussed with nicole

## 2012-10-29 NOTE — Telephone Encounter (Signed)
Agree. Needs to be seen if she has pain

## 2012-10-29 NOTE — Telephone Encounter (Signed)
[name] with home health left a msg stating that she saw the pt yesterday & she would like an order for nursing twice a week for 2 weeks & then stop nursing & continue with PT. Please advise.

## 2012-10-30 DIAGNOSIS — M6281 Muscle weakness (generalized): Secondary | ICD-10-CM | POA: Diagnosis not present

## 2012-10-30 DIAGNOSIS — M545 Low back pain, unspecified: Secondary | ICD-10-CM | POA: Diagnosis not present

## 2012-10-30 DIAGNOSIS — Z4789 Encounter for other orthopedic aftercare: Secondary | ICD-10-CM | POA: Diagnosis not present

## 2012-10-30 DIAGNOSIS — I10 Essential (primary) hypertension: Secondary | ICD-10-CM | POA: Diagnosis not present

## 2012-10-30 DIAGNOSIS — R269 Unspecified abnormalities of gait and mobility: Secondary | ICD-10-CM | POA: Diagnosis not present

## 2012-10-31 DIAGNOSIS — R269 Unspecified abnormalities of gait and mobility: Secondary | ICD-10-CM | POA: Diagnosis not present

## 2012-10-31 DIAGNOSIS — Z4789 Encounter for other orthopedic aftercare: Secondary | ICD-10-CM | POA: Diagnosis not present

## 2012-10-31 DIAGNOSIS — M545 Low back pain, unspecified: Secondary | ICD-10-CM | POA: Diagnosis not present

## 2012-10-31 DIAGNOSIS — I10 Essential (primary) hypertension: Secondary | ICD-10-CM | POA: Diagnosis not present

## 2012-10-31 DIAGNOSIS — M6281 Muscle weakness (generalized): Secondary | ICD-10-CM | POA: Diagnosis not present

## 2012-11-01 DIAGNOSIS — M545 Low back pain, unspecified: Secondary | ICD-10-CM | POA: Diagnosis not present

## 2012-11-01 DIAGNOSIS — M549 Dorsalgia, unspecified: Secondary | ICD-10-CM | POA: Diagnosis not present

## 2012-11-01 DIAGNOSIS — Z981 Arthrodesis status: Secondary | ICD-10-CM | POA: Diagnosis not present

## 2012-11-04 DIAGNOSIS — M6281 Muscle weakness (generalized): Secondary | ICD-10-CM | POA: Diagnosis not present

## 2012-11-04 DIAGNOSIS — M545 Low back pain, unspecified: Secondary | ICD-10-CM | POA: Diagnosis not present

## 2012-11-04 DIAGNOSIS — Z4789 Encounter for other orthopedic aftercare: Secondary | ICD-10-CM | POA: Diagnosis not present

## 2012-11-04 DIAGNOSIS — R269 Unspecified abnormalities of gait and mobility: Secondary | ICD-10-CM | POA: Diagnosis not present

## 2012-11-04 DIAGNOSIS — I10 Essential (primary) hypertension: Secondary | ICD-10-CM | POA: Diagnosis not present

## 2012-11-06 DIAGNOSIS — Z4789 Encounter for other orthopedic aftercare: Secondary | ICD-10-CM | POA: Diagnosis not present

## 2012-11-06 DIAGNOSIS — I10 Essential (primary) hypertension: Secondary | ICD-10-CM | POA: Diagnosis not present

## 2012-11-06 DIAGNOSIS — M545 Low back pain, unspecified: Secondary | ICD-10-CM | POA: Diagnosis not present

## 2012-11-06 DIAGNOSIS — R269 Unspecified abnormalities of gait and mobility: Secondary | ICD-10-CM | POA: Diagnosis not present

## 2012-11-06 DIAGNOSIS — M6281 Muscle weakness (generalized): Secondary | ICD-10-CM | POA: Diagnosis not present

## 2012-11-08 DIAGNOSIS — Z4789 Encounter for other orthopedic aftercare: Secondary | ICD-10-CM | POA: Diagnosis not present

## 2012-11-08 DIAGNOSIS — R269 Unspecified abnormalities of gait and mobility: Secondary | ICD-10-CM | POA: Diagnosis not present

## 2012-11-08 DIAGNOSIS — M545 Low back pain, unspecified: Secondary | ICD-10-CM | POA: Diagnosis not present

## 2012-11-08 DIAGNOSIS — M6281 Muscle weakness (generalized): Secondary | ICD-10-CM | POA: Diagnosis not present

## 2012-11-08 DIAGNOSIS — I10 Essential (primary) hypertension: Secondary | ICD-10-CM | POA: Diagnosis not present

## 2012-11-18 ENCOUNTER — Ambulatory Visit: Payer: Medicare Other | Attending: Orthopedic Surgery | Admitting: Physical Therapy

## 2012-11-18 DIAGNOSIS — IMO0001 Reserved for inherently not codable concepts without codable children: Secondary | ICD-10-CM | POA: Insufficient documentation

## 2012-11-18 DIAGNOSIS — M25569 Pain in unspecified knee: Secondary | ICD-10-CM | POA: Insufficient documentation

## 2012-11-18 DIAGNOSIS — R262 Difficulty in walking, not elsewhere classified: Secondary | ICD-10-CM | POA: Diagnosis not present

## 2012-11-18 DIAGNOSIS — M25669 Stiffness of unspecified knee, not elsewhere classified: Secondary | ICD-10-CM | POA: Diagnosis not present

## 2012-11-20 ENCOUNTER — Ambulatory Visit: Payer: Medicare Other | Admitting: Physical Therapy

## 2012-11-20 DIAGNOSIS — R262 Difficulty in walking, not elsewhere classified: Secondary | ICD-10-CM | POA: Diagnosis not present

## 2012-11-20 DIAGNOSIS — M25669 Stiffness of unspecified knee, not elsewhere classified: Secondary | ICD-10-CM | POA: Diagnosis not present

## 2012-11-20 DIAGNOSIS — IMO0001 Reserved for inherently not codable concepts without codable children: Secondary | ICD-10-CM | POA: Diagnosis not present

## 2012-11-20 DIAGNOSIS — M25569 Pain in unspecified knee: Secondary | ICD-10-CM | POA: Diagnosis not present

## 2012-11-22 ENCOUNTER — Ambulatory Visit: Payer: Medicare Other | Admitting: Physical Therapy

## 2012-11-22 DIAGNOSIS — R262 Difficulty in walking, not elsewhere classified: Secondary | ICD-10-CM | POA: Diagnosis not present

## 2012-11-22 DIAGNOSIS — IMO0001 Reserved for inherently not codable concepts without codable children: Secondary | ICD-10-CM | POA: Diagnosis not present

## 2012-11-22 DIAGNOSIS — M25569 Pain in unspecified knee: Secondary | ICD-10-CM | POA: Diagnosis not present

## 2012-11-22 DIAGNOSIS — M25669 Stiffness of unspecified knee, not elsewhere classified: Secondary | ICD-10-CM | POA: Diagnosis not present

## 2012-11-25 ENCOUNTER — Ambulatory Visit: Payer: Medicare Other | Admitting: Physical Therapy

## 2012-11-25 DIAGNOSIS — M25569 Pain in unspecified knee: Secondary | ICD-10-CM | POA: Diagnosis not present

## 2012-11-25 DIAGNOSIS — R262 Difficulty in walking, not elsewhere classified: Secondary | ICD-10-CM | POA: Diagnosis not present

## 2012-11-25 DIAGNOSIS — M25669 Stiffness of unspecified knee, not elsewhere classified: Secondary | ICD-10-CM | POA: Diagnosis not present

## 2012-11-25 DIAGNOSIS — IMO0001 Reserved for inherently not codable concepts without codable children: Secondary | ICD-10-CM | POA: Diagnosis not present

## 2012-11-27 ENCOUNTER — Ambulatory Visit: Payer: Medicare Other | Admitting: Physical Therapy

## 2012-11-27 DIAGNOSIS — M25669 Stiffness of unspecified knee, not elsewhere classified: Secondary | ICD-10-CM | POA: Diagnosis not present

## 2012-11-27 DIAGNOSIS — R262 Difficulty in walking, not elsewhere classified: Secondary | ICD-10-CM | POA: Diagnosis not present

## 2012-11-27 DIAGNOSIS — Z96659 Presence of unspecified artificial knee joint: Secondary | ICD-10-CM | POA: Diagnosis not present

## 2012-11-27 DIAGNOSIS — M25569 Pain in unspecified knee: Secondary | ICD-10-CM | POA: Diagnosis not present

## 2012-11-27 DIAGNOSIS — IMO0001 Reserved for inherently not codable concepts without codable children: Secondary | ICD-10-CM | POA: Diagnosis not present

## 2012-11-29 ENCOUNTER — Ambulatory Visit: Payer: Medicare Other | Admitting: Physical Therapy

## 2012-11-29 DIAGNOSIS — IMO0001 Reserved for inherently not codable concepts without codable children: Secondary | ICD-10-CM | POA: Diagnosis not present

## 2012-11-29 DIAGNOSIS — R262 Difficulty in walking, not elsewhere classified: Secondary | ICD-10-CM | POA: Diagnosis not present

## 2012-11-29 DIAGNOSIS — M25669 Stiffness of unspecified knee, not elsewhere classified: Secondary | ICD-10-CM | POA: Diagnosis not present

## 2012-11-29 DIAGNOSIS — M25569 Pain in unspecified knee: Secondary | ICD-10-CM | POA: Diagnosis not present

## 2012-12-02 ENCOUNTER — Ambulatory Visit: Payer: Medicare Other | Admitting: Physical Therapy

## 2012-12-02 DIAGNOSIS — M25669 Stiffness of unspecified knee, not elsewhere classified: Secondary | ICD-10-CM | POA: Diagnosis not present

## 2012-12-02 DIAGNOSIS — M25569 Pain in unspecified knee: Secondary | ICD-10-CM | POA: Diagnosis not present

## 2012-12-02 DIAGNOSIS — IMO0001 Reserved for inherently not codable concepts without codable children: Secondary | ICD-10-CM | POA: Diagnosis not present

## 2012-12-02 DIAGNOSIS — R262 Difficulty in walking, not elsewhere classified: Secondary | ICD-10-CM | POA: Diagnosis not present

## 2012-12-04 ENCOUNTER — Ambulatory Visit: Payer: Medicare Other | Admitting: Physical Therapy

## 2012-12-04 DIAGNOSIS — R262 Difficulty in walking, not elsewhere classified: Secondary | ICD-10-CM | POA: Diagnosis not present

## 2012-12-04 DIAGNOSIS — IMO0001 Reserved for inherently not codable concepts without codable children: Secondary | ICD-10-CM | POA: Diagnosis not present

## 2012-12-04 DIAGNOSIS — M25569 Pain in unspecified knee: Secondary | ICD-10-CM | POA: Diagnosis not present

## 2012-12-04 DIAGNOSIS — M25669 Stiffness of unspecified knee, not elsewhere classified: Secondary | ICD-10-CM | POA: Diagnosis not present

## 2012-12-06 ENCOUNTER — Ambulatory Visit: Payer: Medicare Other | Admitting: Physical Therapy

## 2012-12-09 ENCOUNTER — Ambulatory Visit: Payer: Medicare Other | Admitting: Physical Therapy

## 2012-12-09 DIAGNOSIS — R262 Difficulty in walking, not elsewhere classified: Secondary | ICD-10-CM | POA: Diagnosis not present

## 2012-12-09 DIAGNOSIS — M25669 Stiffness of unspecified knee, not elsewhere classified: Secondary | ICD-10-CM | POA: Diagnosis not present

## 2012-12-09 DIAGNOSIS — M25569 Pain in unspecified knee: Secondary | ICD-10-CM | POA: Diagnosis not present

## 2012-12-09 DIAGNOSIS — M48062 Spinal stenosis, lumbar region with neurogenic claudication: Secondary | ICD-10-CM | POA: Diagnosis not present

## 2012-12-09 DIAGNOSIS — IMO0001 Reserved for inherently not codable concepts without codable children: Secondary | ICD-10-CM | POA: Diagnosis not present

## 2012-12-09 DIAGNOSIS — Q762 Congenital spondylolisthesis: Secondary | ICD-10-CM | POA: Diagnosis not present

## 2012-12-11 ENCOUNTER — Ambulatory Visit: Payer: Medicare Other | Attending: Orthopedic Surgery | Admitting: Physical Therapy

## 2012-12-11 DIAGNOSIS — R262 Difficulty in walking, not elsewhere classified: Secondary | ICD-10-CM | POA: Diagnosis not present

## 2012-12-11 DIAGNOSIS — M25669 Stiffness of unspecified knee, not elsewhere classified: Secondary | ICD-10-CM | POA: Insufficient documentation

## 2012-12-11 DIAGNOSIS — IMO0001 Reserved for inherently not codable concepts without codable children: Secondary | ICD-10-CM | POA: Insufficient documentation

## 2012-12-11 DIAGNOSIS — M25569 Pain in unspecified knee: Secondary | ICD-10-CM | POA: Diagnosis not present

## 2012-12-13 ENCOUNTER — Ambulatory Visit: Payer: Medicare Other | Admitting: Physical Therapy

## 2012-12-13 DIAGNOSIS — M25569 Pain in unspecified knee: Secondary | ICD-10-CM | POA: Diagnosis not present

## 2012-12-13 DIAGNOSIS — IMO0001 Reserved for inherently not codable concepts without codable children: Secondary | ICD-10-CM | POA: Diagnosis not present

## 2012-12-13 DIAGNOSIS — M25669 Stiffness of unspecified knee, not elsewhere classified: Secondary | ICD-10-CM | POA: Diagnosis not present

## 2012-12-13 DIAGNOSIS — R262 Difficulty in walking, not elsewhere classified: Secondary | ICD-10-CM | POA: Diagnosis not present

## 2012-12-16 ENCOUNTER — Ambulatory Visit: Payer: Medicare Other | Admitting: Physical Therapy

## 2012-12-16 DIAGNOSIS — M25669 Stiffness of unspecified knee, not elsewhere classified: Secondary | ICD-10-CM | POA: Diagnosis not present

## 2012-12-16 DIAGNOSIS — R262 Difficulty in walking, not elsewhere classified: Secondary | ICD-10-CM | POA: Diagnosis not present

## 2012-12-16 DIAGNOSIS — M25569 Pain in unspecified knee: Secondary | ICD-10-CM | POA: Diagnosis not present

## 2012-12-16 DIAGNOSIS — IMO0001 Reserved for inherently not codable concepts without codable children: Secondary | ICD-10-CM | POA: Diagnosis not present

## 2012-12-18 ENCOUNTER — Ambulatory Visit: Payer: Medicare Other | Admitting: Physical Therapy

## 2012-12-18 DIAGNOSIS — R262 Difficulty in walking, not elsewhere classified: Secondary | ICD-10-CM | POA: Diagnosis not present

## 2012-12-18 DIAGNOSIS — M25569 Pain in unspecified knee: Secondary | ICD-10-CM | POA: Diagnosis not present

## 2012-12-18 DIAGNOSIS — M25669 Stiffness of unspecified knee, not elsewhere classified: Secondary | ICD-10-CM | POA: Diagnosis not present

## 2012-12-18 DIAGNOSIS — IMO0001 Reserved for inherently not codable concepts without codable children: Secondary | ICD-10-CM | POA: Diagnosis not present

## 2012-12-20 ENCOUNTER — Ambulatory Visit: Payer: Medicare Other | Admitting: Physical Therapy

## 2012-12-20 DIAGNOSIS — M25569 Pain in unspecified knee: Secondary | ICD-10-CM | POA: Diagnosis not present

## 2012-12-20 DIAGNOSIS — R262 Difficulty in walking, not elsewhere classified: Secondary | ICD-10-CM | POA: Diagnosis not present

## 2012-12-20 DIAGNOSIS — M25669 Stiffness of unspecified knee, not elsewhere classified: Secondary | ICD-10-CM | POA: Diagnosis not present

## 2012-12-20 DIAGNOSIS — IMO0001 Reserved for inherently not codable concepts without codable children: Secondary | ICD-10-CM | POA: Diagnosis not present

## 2012-12-23 ENCOUNTER — Ambulatory Visit: Payer: Medicare Other | Admitting: Physical Therapy

## 2012-12-23 DIAGNOSIS — M25669 Stiffness of unspecified knee, not elsewhere classified: Secondary | ICD-10-CM | POA: Diagnosis not present

## 2012-12-23 DIAGNOSIS — M25569 Pain in unspecified knee: Secondary | ICD-10-CM | POA: Diagnosis not present

## 2012-12-23 DIAGNOSIS — R262 Difficulty in walking, not elsewhere classified: Secondary | ICD-10-CM | POA: Diagnosis not present

## 2012-12-23 DIAGNOSIS — IMO0001 Reserved for inherently not codable concepts without codable children: Secondary | ICD-10-CM | POA: Diagnosis not present

## 2012-12-25 ENCOUNTER — Ambulatory Visit: Payer: Medicare Other | Admitting: Physical Therapy

## 2012-12-25 DIAGNOSIS — M25669 Stiffness of unspecified knee, not elsewhere classified: Secondary | ICD-10-CM | POA: Diagnosis not present

## 2012-12-25 DIAGNOSIS — IMO0001 Reserved for inherently not codable concepts without codable children: Secondary | ICD-10-CM | POA: Diagnosis not present

## 2012-12-25 DIAGNOSIS — R262 Difficulty in walking, not elsewhere classified: Secondary | ICD-10-CM | POA: Diagnosis not present

## 2012-12-25 DIAGNOSIS — M25569 Pain in unspecified knee: Secondary | ICD-10-CM | POA: Diagnosis not present

## 2012-12-30 ENCOUNTER — Ambulatory Visit: Payer: Medicare Other | Admitting: Physical Therapy

## 2012-12-30 DIAGNOSIS — IMO0001 Reserved for inherently not codable concepts without codable children: Secondary | ICD-10-CM | POA: Diagnosis not present

## 2012-12-30 DIAGNOSIS — M25569 Pain in unspecified knee: Secondary | ICD-10-CM | POA: Diagnosis not present

## 2012-12-30 DIAGNOSIS — M25669 Stiffness of unspecified knee, not elsewhere classified: Secondary | ICD-10-CM | POA: Diagnosis not present

## 2012-12-30 DIAGNOSIS — R262 Difficulty in walking, not elsewhere classified: Secondary | ICD-10-CM | POA: Diagnosis not present

## 2013-01-01 ENCOUNTER — Ambulatory Visit: Payer: Medicare Other | Admitting: Physical Therapy

## 2013-01-03 ENCOUNTER — Ambulatory Visit: Payer: Medicare Other | Admitting: Physical Therapy

## 2013-01-03 DIAGNOSIS — M25669 Stiffness of unspecified knee, not elsewhere classified: Secondary | ICD-10-CM | POA: Diagnosis not present

## 2013-01-03 DIAGNOSIS — M25569 Pain in unspecified knee: Secondary | ICD-10-CM | POA: Diagnosis not present

## 2013-01-03 DIAGNOSIS — R262 Difficulty in walking, not elsewhere classified: Secondary | ICD-10-CM | POA: Diagnosis not present

## 2013-01-03 DIAGNOSIS — IMO0001 Reserved for inherently not codable concepts without codable children: Secondary | ICD-10-CM | POA: Diagnosis not present

## 2013-01-06 ENCOUNTER — Ambulatory Visit: Payer: Medicare Other | Admitting: Physical Therapy

## 2013-01-06 DIAGNOSIS — M25669 Stiffness of unspecified knee, not elsewhere classified: Secondary | ICD-10-CM | POA: Diagnosis not present

## 2013-01-06 DIAGNOSIS — M25569 Pain in unspecified knee: Secondary | ICD-10-CM | POA: Diagnosis not present

## 2013-01-06 DIAGNOSIS — R262 Difficulty in walking, not elsewhere classified: Secondary | ICD-10-CM | POA: Diagnosis not present

## 2013-01-06 DIAGNOSIS — IMO0001 Reserved for inherently not codable concepts without codable children: Secondary | ICD-10-CM | POA: Diagnosis not present

## 2013-01-08 ENCOUNTER — Ambulatory Visit: Payer: Medicare Other | Admitting: Physical Therapy

## 2013-01-10 ENCOUNTER — Ambulatory Visit: Payer: Medicare Other | Attending: Orthopedic Surgery | Admitting: Physical Therapy

## 2013-01-10 DIAGNOSIS — M25569 Pain in unspecified knee: Secondary | ICD-10-CM | POA: Diagnosis not present

## 2013-01-10 DIAGNOSIS — M25669 Stiffness of unspecified knee, not elsewhere classified: Secondary | ICD-10-CM | POA: Diagnosis not present

## 2013-01-10 DIAGNOSIS — IMO0001 Reserved for inherently not codable concepts without codable children: Secondary | ICD-10-CM | POA: Insufficient documentation

## 2013-01-10 DIAGNOSIS — R262 Difficulty in walking, not elsewhere classified: Secondary | ICD-10-CM | POA: Diagnosis not present

## 2013-01-15 ENCOUNTER — Encounter: Payer: Medicare Other | Admitting: Physical Therapy

## 2013-01-17 ENCOUNTER — Ambulatory Visit: Payer: Medicare Other | Admitting: Physical Therapy

## 2013-01-17 DIAGNOSIS — M25569 Pain in unspecified knee: Secondary | ICD-10-CM | POA: Diagnosis not present

## 2013-01-17 DIAGNOSIS — IMO0001 Reserved for inherently not codable concepts without codable children: Secondary | ICD-10-CM | POA: Diagnosis not present

## 2013-01-17 DIAGNOSIS — R262 Difficulty in walking, not elsewhere classified: Secondary | ICD-10-CM | POA: Diagnosis not present

## 2013-01-17 DIAGNOSIS — M25669 Stiffness of unspecified knee, not elsewhere classified: Secondary | ICD-10-CM | POA: Diagnosis not present

## 2013-01-22 ENCOUNTER — Ambulatory Visit: Payer: Medicare Other | Admitting: Physical Therapy

## 2013-01-22 DIAGNOSIS — R262 Difficulty in walking, not elsewhere classified: Secondary | ICD-10-CM | POA: Diagnosis not present

## 2013-01-22 DIAGNOSIS — M25669 Stiffness of unspecified knee, not elsewhere classified: Secondary | ICD-10-CM | POA: Diagnosis not present

## 2013-01-22 DIAGNOSIS — M25569 Pain in unspecified knee: Secondary | ICD-10-CM | POA: Diagnosis not present

## 2013-01-22 DIAGNOSIS — IMO0001 Reserved for inherently not codable concepts without codable children: Secondary | ICD-10-CM | POA: Diagnosis not present

## 2013-01-29 ENCOUNTER — Ambulatory Visit: Payer: Medicare Other | Admitting: Physical Therapy

## 2013-01-29 DIAGNOSIS — R262 Difficulty in walking, not elsewhere classified: Secondary | ICD-10-CM | POA: Diagnosis not present

## 2013-01-29 DIAGNOSIS — M25569 Pain in unspecified knee: Secondary | ICD-10-CM | POA: Diagnosis not present

## 2013-01-29 DIAGNOSIS — M25669 Stiffness of unspecified knee, not elsewhere classified: Secondary | ICD-10-CM | POA: Diagnosis not present

## 2013-01-29 DIAGNOSIS — IMO0001 Reserved for inherently not codable concepts without codable children: Secondary | ICD-10-CM | POA: Diagnosis not present

## 2013-02-03 ENCOUNTER — Telehealth: Payer: Self-pay | Admitting: Internal Medicine

## 2013-02-04 ENCOUNTER — Encounter: Payer: Self-pay | Admitting: *Deleted

## 2013-02-04 NOTE — Telephone Encounter (Signed)
Pt made aware rx & controlled substance contrace are ready to be picked up at front desk & she needs to schedule an OV.

## 2013-02-04 NOTE — Telephone Encounter (Signed)
Due for a office visit, please arrange. Needs controlled substance contract Ok #30

## 2013-02-04 NOTE — Telephone Encounter (Signed)
Ok to refill? Last OV 12.19,13 Last filled 12.19.13

## 2013-02-05 ENCOUNTER — Ambulatory Visit: Payer: Medicare Other | Admitting: Physical Therapy

## 2013-02-07 DIAGNOSIS — Z79899 Other long term (current) drug therapy: Secondary | ICD-10-CM | POA: Diagnosis not present

## 2013-02-14 ENCOUNTER — Ambulatory Visit: Payer: Medicare Other | Attending: Orthopedic Surgery | Admitting: Physical Therapy

## 2013-02-14 DIAGNOSIS — IMO0001 Reserved for inherently not codable concepts without codable children: Secondary | ICD-10-CM | POA: Insufficient documentation

## 2013-02-14 DIAGNOSIS — M25569 Pain in unspecified knee: Secondary | ICD-10-CM | POA: Insufficient documentation

## 2013-02-14 DIAGNOSIS — R262 Difficulty in walking, not elsewhere classified: Secondary | ICD-10-CM | POA: Insufficient documentation

## 2013-02-14 DIAGNOSIS — M25669 Stiffness of unspecified knee, not elsewhere classified: Secondary | ICD-10-CM | POA: Insufficient documentation

## 2013-02-27 ENCOUNTER — Ambulatory Visit (INDEPENDENT_AMBULATORY_CARE_PROVIDER_SITE_OTHER): Payer: Medicare Other | Admitting: Internal Medicine

## 2013-02-27 ENCOUNTER — Encounter: Payer: Self-pay | Admitting: Internal Medicine

## 2013-02-27 VITALS — BP 136/82 | HR 58 | Temp 98.6°F | Ht 64.75 in | Wt 156.0 lb

## 2013-02-27 DIAGNOSIS — M1990 Unspecified osteoarthritis, unspecified site: Secondary | ICD-10-CM

## 2013-02-27 DIAGNOSIS — Z0000 Encounter for general adult medical examination without abnormal findings: Secondary | ICD-10-CM | POA: Diagnosis not present

## 2013-02-27 DIAGNOSIS — I10 Essential (primary) hypertension: Secondary | ICD-10-CM

## 2013-02-27 DIAGNOSIS — F411 Generalized anxiety disorder: Secondary | ICD-10-CM

## 2013-02-27 LAB — COMPREHENSIVE METABOLIC PANEL
ALT: 12 U/L (ref 0–35)
AST: 26 U/L (ref 0–37)
Albumin: 4.5 g/dL (ref 3.5–5.2)
Alkaline Phosphatase: 73 U/L (ref 39–117)
BUN: 20 mg/dL (ref 6–23)
CO2: 29 mEq/L (ref 19–32)
Calcium: 9.5 mg/dL (ref 8.4–10.5)
Chloride: 99 mEq/L (ref 96–112)
Creatinine, Ser: 0.9 mg/dL (ref 0.4–1.2)
GFR: 65.38 mL/min (ref >60.00)
Glucose, Bld: 94 mg/dL (ref 70–99)
Potassium: 4.5 mEq/L (ref 3.5–5.1)
Sodium: 136 mEq/L (ref 135–145)
Total Bilirubin: 1 mg/dL (ref 0.3–1.2)
Total Protein: 6.9 g/dL (ref 6.0–8.3)

## 2013-02-27 MED ORDER — CELECOXIB 100 MG PO CAPS: 100.0000 mg | ORAL_CAPSULE | Freq: Two times a day (BID) | ORAL | Status: DC

## 2013-02-27 MED ORDER — LORAZEPAM 0.5 MG PO TABS: ORAL_TABLET | ORAL | Status: DC

## 2013-02-27 NOTE — Progress Notes (Signed)
  Subjective:    Patient ID: Laurie RIESTER, female    DOB: [birth year], 72 y.o.   MRN: [id number]  HPI ROV,  Likes to focus on pain management rather than doing a physical.  Chief complaint today is pain management, status post back surgery 09-2012 Currently taking robaxin, gabapentin, and hydrocodone? A few days ago started naproxen 500 mg twice a day because she does not like to take stronger pain medications. Wonders if that is okay.  Hypertension good medication compliance, ambulatory BPs are good.  Anxiety, still has some difficulty sleeping, would like a refill of Ativan.  Past Medical History  Diagnosis Date  . History of elevated homocysteine   . Thyroid nodule     rt benign colloid nodule--left hyperplastic nodule- Thyroid bx 2006: neg  . Baker's cyst   . Goiter   . Adenomatous colon polyp 03/2003  . PONV (postoperative nausea and vomiting)   . Anxiety     ativan for sleep  . OA (osteoarthritis)      knee pain after TKR left , back pain- last steroid injection back 2 months ago Dr Ethelene Hal  . Hypertension 09/11/2005   Past Surgical History  Procedure Laterality Date  . Abdominal hysterectomy    . Oophorectomy    . Knee arthroscopy      L   replacement 2007, Dr Earlie Lou, then a manipulation, then redo TKR 11/09 w/ Dr.Alusio. Parcial replacement again 09-2010  . Total hip arthroplasty  3/11    Right   . Facelift    . Incisional hernia repair    . Knee surgery      x 5-  left  . Shoulder surgery      replacements: L 2010, R 2012  . Bakers cystectomy      right knee  . Total knee arthroplasty  05/20/2012    Procedure: TOTAL KNEE ARTHROPLASTY;  Surgeon: Loanne Drilling, MD;  Location: [facility];  Service: Orthopedics;  Laterality: Right;  . Back surgery  10-01-2012    at [facility], lumbar    Social History:  Married, 2 children  Occupation: Retired Runner, broadcasting/film/video  Tobacco--Stopped 1985  Alcohol Use - seldom, wine     Review of Systems No chest pain or SOB No nausea, vomiting,  diarrhea or blood in the stools. Few days ago had a mechanical   fall, tripped on a cable. Landed on her knees,  pain in general is at baseline. Reports some pain and stiffness in her hand joints mostly in the mornings    Objective:   Physical Exam BP 136/82  Pulse 58  Temp(Src) 98.6 F (37 C) (Oral)  Ht 5' 4.75" (1.645 m)  Wt 156 lb (70.761 kg)  BMI 26.15 kg/m2  General -- alert, well-developed, NAD .    Lungs -- normal respiratory effort, no intercostal retractions, no accessory muscle use, and normal breath sounds.   Heart-- normal rate, regular rhythm, no murmur, and no gallop.   Extremities-- no pretibial edema bilaterally; Hands and wrists and changes consistent with DJD, no synovitis on exam. Neurologic-- alert & oriented X3 and strength normal in all extremities. Psych-- Cognition and judgment appear intact. Alert and cooperative with normal attention span and concentration.  not anxious appearing and not depressed appearing.      Assessment & Plan:

## 2013-02-27 NOTE — Patient Instructions (Addendum)
Take Celebrex samples 200 mg one tablet daily, take it with food, stop it if it causes gastritis, nausea, change in the color of stools or you have stomach pain. If you  like Celebrex and can tolerate it, then get the Celebrex 100 mg one tablet twice a day as needed, take the minimal necessary dose Come back in 2 months for a physical, fasting.

## 2013-02-27 NOTE — Assessment & Plan Note (Signed)
Continue with Ativan at night

## 2013-02-27 NOTE — Assessment & Plan Note (Addendum)
We are not doing a CPX today but info was updated, will RTC for a cpx ----  Td 2013 pneumonia shot 09 Shingles shot--had in 2008  multiple neg MMG, last 8-12  s/p Hysterectomy for bening reasons, but h/o abnormal PAP x 1, s/p LEEP?---->  PAP 2013 (-) except for yeast Plan: pap q 3 years.  last Colonoscopy:  04/08/2003  (Results:  Polyps)--------------->  Since the last time we discussed her FH, her father has developed colon ca thus will refer to GI  DEXA 2006 and 04-2010 --normal

## 2013-02-27 NOTE — Assessment & Plan Note (Signed)
Seems  well-controlled, labs 

## 2013-02-27 NOTE — Assessment & Plan Note (Addendum)
Main concern today is pain management, she is status post back surgery 09-2012 She's taking naproxen twice a day for the past few days, risk PUD and renal damage discuss, recommend Celebrex; historically Tylenol never helped much Plan:  celebrex, GI precautions discussed  Continue with PPIs for GI protection, will also have to monitor renal function . She will continue with gabapentin for left leg paresthesias. Continue methocarbamol She's taking a narcotic prescribed elsewhere, does not recall the name.

## 2013-02-28 LAB — CBC WITH DIFFERENTIAL/PLATELET
Basophils Absolute: 0.1 10*3/uL (ref 0.0–0.1)
Basophils Relative: 0.8 % (ref 0.0–3.0)
Eosinophils Absolute: 0.4 10*3/uL (ref 0.0–0.7)
Eosinophils Relative: 5.3 % — ABNORMAL HIGH (ref 0.0–5.0)
HCT: 41.1 % (ref 36.0–46.0)
Hemoglobin: 13.6 g/dL (ref 12.0–15.0)
Lymphocytes Relative: 30.6 % (ref 12.0–46.0)
Lymphs Abs: 2.1 10*3/uL (ref 0.7–4.0)
MCHC: 33.1 g/dL (ref 30.0–36.0)
MCV: 88.8 fl (ref 78.0–100.0)
Monocytes Absolute: 0.4 10*3/uL (ref 0.1–1.0)
Monocytes Relative: 6.1 % (ref 3.0–12.0)
Neutro Abs: 3.9 10*3/uL (ref 1.4–7.7)
Neutrophils Relative %: 57.2 % (ref 43.0–77.0)
Platelets: 308 10*3/uL (ref 150.0–400.0)
RBC: 4.63 Mil/uL (ref 3.87–5.11)
RDW: 14.9 % — ABNORMAL HIGH (ref 11.5–14.6)
WBC: 6.9 10*3/uL (ref 4.5–10.5)

## 2013-03-03 DIAGNOSIS — Z79899 Other long term (current) drug therapy: Secondary | ICD-10-CM | POA: Diagnosis not present

## 2013-03-04 ENCOUNTER — Encounter: Payer: Self-pay | Admitting: *Deleted

## 2013-03-11 ENCOUNTER — Encounter: Payer: Self-pay | Admitting: Internal Medicine

## 2013-03-17 ENCOUNTER — Encounter: Payer: Self-pay | Admitting: Family Medicine

## 2013-03-17 ENCOUNTER — Ambulatory Visit (INDEPENDENT_AMBULATORY_CARE_PROVIDER_SITE_OTHER): Payer: Medicare Other | Admitting: Family Medicine

## 2013-03-17 VITALS — BP 120/76 | HR 65 | Temp 98.5°F | Ht 64.75 in | Wt 152.6 lb

## 2013-03-17 DIAGNOSIS — J329 Chronic sinusitis, unspecified: Secondary | ICD-10-CM

## 2013-03-17 MED ORDER — AMOXICILLIN 875 MG PO TABS: 875.0000 mg | ORAL_TABLET | Freq: Two times a day (BID) | ORAL | Status: DC

## 2013-03-17 NOTE — Assessment & Plan Note (Signed)
Pt's sxs and PE consistent w/ infxn.  Start abx.  Reviewed supportive care and red flags that should prompt return.  Pt expressed understanding and is in agreement w/ plan.  

## 2013-03-17 NOTE — Progress Notes (Signed)
  Subjective:    Patient ID: [name], female    DOB: [birth year], 72 y.o.   MRN: [id number]  HPI Sinus pressure- sxs started 3 days ago and 'overmedicated myself w/ OTC cold and sinus meds'.  + tooth pain, facial pain.  No fevers.  + HA and dizziness.  No ear pain.  + nausea, vomiting x1.  No known sick contacts.  Hx of remote sinus infxns.  No known abx allergies.   Review of Systems For ROS see HPI     Objective:   Physical Exam  Vitals reviewed. Constitutional: She appears well-developed and well-nourished. No distress.  HENT:  Head: Normocephalic and atraumatic.  Right Ear: Tympanic membrane normal.  Left Ear: Tympanic membrane normal.  Nose: Mucosal edema and rhinorrhea present. Right sinus exhibits maxillary sinus tenderness and frontal sinus tenderness. Left sinus exhibits maxillary sinus tenderness and frontal sinus tenderness.  Mouth/Throat: Uvula is midline and mucous membranes are normal. Posterior oropharyngeal erythema present. No oropharyngeal exudate.  Eyes: Conjunctivae and EOM are normal. Pupils are equal, round, and reactive to light.  Neck: Normal range of motion. Neck supple.  Cardiovascular: Normal rate, regular rhythm and normal heart sounds.   Pulmonary/Chest: Effort normal and breath sounds normal. No respiratory distress. She has no wheezes.  Lymphadenopathy:    She has no cervical adenopathy.          Assessment & Plan:

## 2013-03-17 NOTE — Patient Instructions (Addendum)
This is a sinus infection Start the Amoxicillin twice daily- take w/ food Drink plenty of fluids REST!!! Call with any questions or concerns Hang in there!!

## 2013-04-02 ENCOUNTER — Encounter: Payer: Self-pay | Admitting: Gastroenterology

## 2013-04-29 DIAGNOSIS — Z981 Arthrodesis status: Secondary | ICD-10-CM | POA: Diagnosis not present

## 2013-04-29 DIAGNOSIS — M545 Low back pain, unspecified: Secondary | ICD-10-CM | POA: Diagnosis not present

## 2013-04-29 DIAGNOSIS — M48062 Spinal stenosis, lumbar region with neurogenic claudication: Secondary | ICD-10-CM | POA: Diagnosis not present

## 2013-04-30 ENCOUNTER — Encounter: Payer: Self-pay | Admitting: Internal Medicine

## 2013-04-30 ENCOUNTER — Ambulatory Visit (INDEPENDENT_AMBULATORY_CARE_PROVIDER_SITE_OTHER): Payer: Medicare Other | Admitting: Internal Medicine

## 2013-04-30 VITALS — BP 110/70 | HR 57 | Temp 98.1°F | Wt 158.4 lb

## 2013-04-30 DIAGNOSIS — R0989 Other specified symptoms and signs involving the circulatory and respiratory systems: Secondary | ICD-10-CM

## 2013-04-30 DIAGNOSIS — Z136 Encounter for screening for cardiovascular disorders: Secondary | ICD-10-CM

## 2013-04-30 DIAGNOSIS — F411 Generalized anxiety disorder: Secondary | ICD-10-CM | POA: Diagnosis not present

## 2013-04-30 DIAGNOSIS — M1990 Unspecified osteoarthritis, unspecified site: Secondary | ICD-10-CM

## 2013-04-30 DIAGNOSIS — I10 Essential (primary) hypertension: Secondary | ICD-10-CM | POA: Diagnosis not present

## 2013-04-30 DIAGNOSIS — Z0000 Encounter for general adult medical examination without abnormal findings: Secondary | ICD-10-CM | POA: Diagnosis not present

## 2013-04-30 DIAGNOSIS — E041 Nontoxic single thyroid nodule: Secondary | ICD-10-CM | POA: Diagnosis not present

## 2013-04-30 DIAGNOSIS — I7789 Other specified disorders of arteries and arterioles: Secondary | ICD-10-CM

## 2013-04-30 LAB — TSH: TSH: 1.11 u[IU]/mL (ref 0.35–5.50)

## 2013-04-30 LAB — LIPID PANEL
Cholesterol: 253 mg/dL — ABNORMAL HIGH (ref 0–200)
HDL: 112.6 mg/dL (ref >39.00)
Total CHOL/HDL Ratio: 2
Triglycerides: 65 mg/dL (ref 0.0–149.0)
VLDL: 13 mg/dL (ref 0.0–40.0)

## 2013-04-30 LAB — LDL CHOLESTEROL, DIRECT: Direct LDL: 120.7 mg/dL

## 2013-04-30 MED ORDER — LORAZEPAM 0.5 MG PO TABS: 0.5000 mg | ORAL_TABLET | Freq: Every evening | ORAL | Status: DC | PRN

## 2013-04-30 NOTE — Assessment & Plan Note (Signed)
U/s 2013 stable Physical exam stable today Plan-- observation

## 2013-04-30 NOTE — Patient Instructions (Addendum)
Get your blood work before you leave  Next visit in 6 months  for a routine office visit Please make an appointment before you leave the office today (or call few weeks in advance) ----- Fall Prevention and Home Safety Falls cause injuries and can affect all age groups. It is possible to use preventive measures to significantly decrease the likelihood of falls. There are many simple measures which can make your home safer and prevent falls. OUTDOORS  Repair cracks and edges of walkways and driveways.  Remove high doorway thresholds.  Trim shrubbery on the main path into your home.  Have good outside lighting.  Clear walkways of tools, rocks, debris, and clutter.  Check that handrails are not broken and are securely fastened. Both sides of steps should have handrails.  Have leaves, snow, and ice cleared regularly.  Use sand or salt on walkways during winter months.  In the garage, clean up grease or oil spills. BATHROOM  Install night lights.  Install grab bars by the toilet and in the tub and shower.  Use non-skid mats or decals in the tub or shower.  Place a plastic non-slip stool in the shower to sit on, if needed.  Keep floors dry and clean up all water on the floor immediately.  Remove soap buildup in the tub or shower on a regular basis.  Secure bath mats with non-slip, double-sided rug tape.  Remove throw rugs and tripping hazards from the floors. BEDROOMS  Install night lights.  Make sure a bedside light is easy to reach.  Do not use oversized bedding.  Keep a telephone by your bedside.  Have a firm chair with side arms to use for getting dressed.  Remove throw rugs and tripping hazards from the floor. KITCHEN  Keep handles on pots and pans turned toward the center of the stove. Use back burners when possible.  Clean up spills quickly and allow time for drying.  Avoid walking on wet floors.  Avoid hot utensils and knives.  Position shelves so  they are not too high or low.  Place commonly used objects within easy reach.  If necessary, use a sturdy step stool with a grab bar when reaching.  Keep electrical cables out of the way.  Do not use floor polish or wax that makes floors slippery. If you must use wax, use non-skid floor wax.  Remove throw rugs and tripping hazards from the floor. STAIRWAYS  Never leave objects on stairs.  Place handrails on both sides of stairways and use them. Fix any loose handrails. Make sure handrails on both sides of the stairways are as long as the stairs.  Check carpeting to make sure it is firmly attached along stairs. Make repairs to worn or loose carpet promptly.  Avoid placing throw rugs at the top or bottom of stairways, or properly secure the rug with carpet tape to prevent slippage. Get rid of throw rugs, if possible.  Have an electrician put in a light switch at the top and bottom of the stairs. OTHER FALL PREVENTION TIPS  Wear low-heel or rubber-soled shoes that are supportive and fit well. Wear closed toe shoes.  When using a stepladder, make sure it is fully opened and both spreaders are firmly locked. Do not climb a closed stepladder.  Add color or contrast paint or tape to grab bars and handrails in your home. Place contrasting color strips on first and last steps.  Learn and use mobility aids as needed. Install an Lobbyist  emergency response system.  Turn on lights to avoid dark areas. Replace light bulbs that burn out immediately. Get light switches that glow.  Arrange furniture to create clear pathways. Keep furniture in the same place.  Firmly attach carpet with non-skid or double-sided tape.  Eliminate uneven floor surfaces.  Select a carpet pattern that does not visually hide the edge of steps.  Be aware of all pets. OTHER HOME SAFETY TIPS  Set the water temperature for 120 F (48.8 C).  Keep emergency numbers on or near the telephone.  Keep smoke  detectors on every level of the home and near sleeping areas. Document Released: 08/18/2002 Document Revised: 02/27/2012 Document Reviewed: 11/17/2011 [facility] Patient Information 2014 [place], [place].

## 2013-04-30 NOTE — Assessment & Plan Note (Addendum)
Doing well w/ one or 2 lorazepam at night. UDS today

## 2013-04-30 NOTE — Assessment & Plan Note (Addendum)
Td 2013 pneumonia shot 09 Shingles shot--had in 2008 Palpable aorta without a bruit ---- rx Ao u/s, will have to research a diagnostic code multiple neg MMG, last 8-12 (-), breast exam today (-), will schedule one   s/p Hysterectomy for bening reasons, but h/o abnormal PAP x 1, s/p LEEP?---->  PAP 2013 (-) except for yeast Plan: pap q 3 years.   Colonoscopy:  04/08/2003  (Results:  Polyps) and again 2009 (-)  father w/  colon ca (?) Per GI letter she is due for Cscope, letter reprinted, encouraged to call GI  DEXA 2006 and 04-2010 --normal    Diet-exercise discussed

## 2013-04-30 NOTE — Assessment & Plan Note (Addendum)
Status post multiple surgeries, having LE parethesias after back surgery, on Neurontin

## 2013-04-30 NOTE — Assessment & Plan Note (Signed)
Well-controlled, no change 

## 2013-04-30 NOTE — Progress Notes (Signed)
Subjective:    Patient ID: [name], female    DOB: 09-28-1940, 72 y.o.   MRN: [id number]  HPI Here for Medicare AWV:  1.Risk factors based on Past M, S, F history: done , see below  2.Physical Activities: active, somehow limited by LE paresthesias , back pain  3.Depression/mood: Denies depression  4.Hearing:  no reported problems  5.ADL's: totally independent  6.Fall Risk: had a fall recently,  see instructions 7.Home Safety: does feel safe at home  8.Height, weight, &visual acuity: see VS, vision corrected w/ glasses  9.Counseling:see a/p  10.Labs ordered based on risk factors: yes  11. Referral Coordination--if needed  12. Care Plan---- see a/p  13. Cognitive Assessment: AOx3, alertness normal and motor skills some how limited by DJD, pain  In addition we discussed the following DJD,Did not like Celebrex, feels lethargic, takes OxyContin as needed.  Has lower extremity paresthesias on Neurontin. Hypertension, good medication compliance, ambulatory BPs within normal. Concerned about her thyroid nodule, physical exam is stable.  Past Medical History  Diagnosis Date  . History of elevated homocysteine   . Thyroid nodule     rt benign colloid nodule--left hyperplastic nodule- Thyroid bx 2006: neg  . Baker's cyst   . Goiter   . Adenomatous colon polyp 03/2003  . PONV (postoperative nausea and vomiting)   . Anxiety     ativan for sleep  . OA (osteoarthritis)      knee pain after TKR left , back pain- last steroid injection back 2 months ago Dr Ethelene Hal  . Hypertension 09/11/2005   Past Surgical History  Procedure Laterality Date  . Abdominal hysterectomy    . Oophorectomy    . Knee arthroscopy      L   replacement 2007, Dr Earlie Lou, then a manipulation, then redo TKR 11/09 w/ Dr.Alusio. Parcial replacement again 09-2010  . Total hip arthroplasty  3/11    Right   . Facelift    . Incisional hernia repair    . Knee surgery      x 5-  left  . Shoulder surgery     replacements: L 2010, R 2012  . Bakers cystectomy      right knee  . Total knee arthroplasty  05/20/2012    Procedure: TOTAL KNEE ARTHROPLASTY;  Surgeon: Loanne Drilling, MD;  Location: [facility];  Service: Orthopedics;  Laterality: Right;  . Back surgery  10-01-2012    at [facility], lumbar   History   Social History  . Marital Status: Married    Spouse Name: N/A    Number of Children: 2  . Years of Education: N/A   Occupational History  . Retired Runner, broadcasting/film/video     retired  .     Social History Main Topics  . Smoking status: Former Smoker    Quit date: 09/12/1983  . Smokeless tobacco: Never Used  . Alcohol Use: Yes     Comment: 2 glasses of daily  . Drug Use: No  . Sexual Activity: Not on file   Other Topics Concern  . Not on file   Social History Narrative   Lives w/ husband of 53 years            Family History  Problem Relation Age of Onset  . Breast cancer Neg Hx   . Coronary artery disease Father     CABG at 77  . Coronary artery disease Brother   . Prostate cancer Father   . Colon polyps Brother   .  Colon polyps Father   . Colon cancer Neg Hx      Review of Systems Diet remains healthy Exercise limited by DJD but she is trying to stay active. No chest or shortness or breath No nausea, vomiting, diarrhea blood in the stools. No dysuria, hematuria or difficulty urinating.       Objective:   Physical Exam BP 110/70  Pulse 57  Temp(Src) 98.1 F (36.7 C)  Wt 158 lb 6.4 oz (71.85 kg)  BMI 26.55 kg/m2  SpO2 97%  General -- alert, well-developed, NAD.  Neck --~ 1 cm nontender right thyroid nodule, normal carotid pulses Lungs -- normal respiratory effort, no intercostal retractions, no accessory muscle use, and normal breath sounds.  Heart-- normal rate, regular rhythm, no murmur.  Abdomen-- Not distended, soft, palpable, nontender aorta in the upper abdomen , no bruit Extremities-- no pretibial edema bilaterally  Neurologic-- alert & oriented X3. Speech, gait  I. Lead limited by DJD. Psych-- Cognition and judgment appear intact. Alert and cooperative with normal attention span and concentration. not anxious appearing and not depressed appearing.      Assessment & Plan:

## 2013-05-02 ENCOUNTER — Encounter: Payer: Self-pay | Admitting: Gastroenterology

## 2013-05-05 ENCOUNTER — Encounter: Payer: Self-pay | Admitting: General Practice

## 2013-05-06 DIAGNOSIS — H2510 Age-related nuclear cataract, unspecified eye: Secondary | ICD-10-CM | POA: Diagnosis not present

## 2013-05-06 DIAGNOSIS — H25019 Cortical age-related cataract, unspecified eye: Secondary | ICD-10-CM | POA: Diagnosis not present

## 2013-05-06 DIAGNOSIS — H524 Presbyopia: Secondary | ICD-10-CM | POA: Diagnosis not present

## 2013-05-26 ENCOUNTER — Encounter: Payer: Medicare Other | Admitting: Gastroenterology

## 2013-05-28 ENCOUNTER — Other Ambulatory Visit: Payer: Self-pay | Admitting: Internal Medicine

## 2013-05-28 NOTE — Telephone Encounter (Signed)
rx refilled per protocol. DJR  

## 2013-05-29 DIAGNOSIS — M25569 Pain in unspecified knee: Secondary | ICD-10-CM | POA: Diagnosis not present

## 2013-06-01 ENCOUNTER — Telehealth: Payer: Self-pay | Admitting: Internal Medicine

## 2013-06-01 DIAGNOSIS — I714 Abdominal aortic aneurysm, without rupture, unspecified: Secondary | ICD-10-CM

## 2013-06-01 NOTE — Telephone Encounter (Signed)
[name]: The patient needs an Aorta u/s-- dx enlarged -palpable Ao or Screening for AAA I have not been able to sign on this order, please call cardiology scheduler and figure out what dx i need to use and enter the order

## 2013-06-03 ENCOUNTER — Telehealth: Payer: Self-pay | Admitting: *Deleted

## 2013-06-03 DIAGNOSIS — Z136 Encounter for screening for cardiovascular disorders: Secondary | ICD-10-CM

## 2013-06-03 DIAGNOSIS — I7789 Other specified disorders of arteries and arterioles: Secondary | ICD-10-CM

## 2013-06-03 NOTE — Telephone Encounter (Signed)
Error

## 2013-06-03 NOTE — Telephone Encounter (Signed)
I spoke with cards and they advised that you tru the dx abdominal aortic anuresym. If this dx does not allow you to sign the order then is needs to go to billing to help resolve.

## 2013-06-03 NOTE — Telephone Encounter (Signed)
done

## 2013-06-04 ENCOUNTER — Telehealth: Payer: Self-pay | Admitting: *Deleted

## 2013-06-04 DIAGNOSIS — I714 Abdominal aortic aneurysm, without rupture, unspecified: Secondary | ICD-10-CM

## 2013-06-04 NOTE — Telephone Encounter (Signed)
Patient called asking why it took a month for someone to tell her she had an enlarged aorta. She is very upset that she is just hearing about this. She would like to know if this is just precautionary or emergent. Pt would like someone to call her back to explain. Call mobile # first, then home #.

## 2013-06-04 NOTE — Telephone Encounter (Signed)
Error

## 2013-06-06 ENCOUNTER — Encounter (INDEPENDENT_AMBULATORY_CARE_PROVIDER_SITE_OTHER): Payer: Medicare Other

## 2013-06-06 DIAGNOSIS — I714 Abdominal aortic aneurysm, without rupture, unspecified: Secondary | ICD-10-CM

## 2013-06-06 DIAGNOSIS — R0989 Other specified symptoms and signs involving the circulatory and respiratory systems: Secondary | ICD-10-CM

## 2013-06-06 DIAGNOSIS — I700 Atherosclerosis of aorta: Secondary | ICD-10-CM | POA: Diagnosis not present

## 2013-06-06 NOTE — Telephone Encounter (Signed)
Spoke with patient, we need to "rule out" a AAA d/t palpable Ao. Pt verbalized understanding

## 2013-06-11 ENCOUNTER — Telehealth: Payer: Self-pay | Admitting: *Deleted

## 2013-06-11 NOTE — Telephone Encounter (Signed)
Pt notified of results via tele. DJR

## 2013-06-11 NOTE — Telephone Encounter (Signed)
Message copied by Eustace Quail on Wed Jun 11, 2013 11:36 AM ------      Message from: [name] E      Created: Mon Jun 09, 2013  8:47 PM       Advise pt: Aorta is normal. ------

## 2013-06-12 NOTE — Addendum Note (Signed)
Addended by: Eustace Quail on: 06/12/2013 09:56 AM   Modules accepted: Orders

## 2013-06-17 ENCOUNTER — Ambulatory Visit
Admission: RE | Admit: 2013-06-17 | Discharge: 2013-06-17 | Disposition: A | Discharge status: Home / Self Care | Payer: Medicare Other | Source: Ambulatory Visit | Attending: Internal Medicine | Admitting: Internal Medicine

## 2013-06-17 DIAGNOSIS — Z0000 Encounter for general adult medical examination without abnormal findings: Secondary | ICD-10-CM

## 2013-06-17 DIAGNOSIS — Z1231 Encounter for screening mammogram for malignant neoplasm of breast: Secondary | ICD-10-CM | POA: Diagnosis not present

## 2013-06-19 DIAGNOSIS — M25569 Pain in unspecified knee: Secondary | ICD-10-CM | POA: Diagnosis not present

## 2013-06-20 DIAGNOSIS — Z23 Encounter for immunization: Secondary | ICD-10-CM | POA: Diagnosis not present

## 2013-07-03 ENCOUNTER — Other Ambulatory Visit: Payer: Self-pay | Admitting: Internal Medicine

## 2013-07-07 ENCOUNTER — Other Ambulatory Visit: Payer: Self-pay | Admitting: *Deleted

## 2013-07-07 MED ORDER — LISINOPRIL-HYDROCHLOROTHIAZIDE 20-12.5 MG PO TABS: ORAL_TABLET | ORAL | Status: DC

## 2013-07-07 NOTE — Telephone Encounter (Signed)
Lisinopril refill sent to pharmacy 

## 2013-07-28 ENCOUNTER — Encounter: Payer: Medicare Other | Admitting: Gastroenterology

## 2013-08-25 ENCOUNTER — Encounter: Payer: Self-pay | Admitting: Gastroenterology

## 2013-09-28 ENCOUNTER — Other Ambulatory Visit: Payer: Self-pay | Admitting: Internal Medicine

## 2013-09-29 ENCOUNTER — Telehealth: Payer: Self-pay | Admitting: *Deleted

## 2013-09-29 MED ORDER — LORAZEPAM 0.5 MG PO TABS: 0.5000 mg | ORAL_TABLET | Freq: Every evening | ORAL | Status: DC | PRN

## 2013-09-29 NOTE — Telephone Encounter (Signed)
LORazepam (ATIVAN) 0.5 MG tablet Last refill: 04/30/13 #60, 2 refills Last OV: 04/30/13 Contract on file, low risk

## 2013-09-29 NOTE — Telephone Encounter (Signed)
Done

## 2013-09-29 NOTE — Telephone Encounter (Signed)
error 

## 2013-09-29 NOTE — Telephone Encounter (Signed)
Script faxed to pharmacy. JG//CMA 

## 2013-09-29 NOTE — Telephone Encounter (Signed)
LORazepam (ATIVAN) 0.5 MG tablet Last refill: 04/30/13 #60, 2 refills Last OV: 04/30/13 Contract on file, low risk 

## 2013-09-30 DIAGNOSIS — M25569 Pain in unspecified knee: Secondary | ICD-10-CM | POA: Diagnosis not present

## 2013-09-30 DIAGNOSIS — M549 Dorsalgia, unspecified: Secondary | ICD-10-CM | POA: Diagnosis not present

## 2013-10-14 ENCOUNTER — Telehealth: Payer: Self-pay | Admitting: *Deleted

## 2013-10-14 NOTE — Telephone Encounter (Signed)
Called and left message, pt missed appointment at 4pm on 10/14/13 and pt would need to call back and reschedule both previsit and colonoscopy appointments.-adm

## 2013-10-15 DIAGNOSIS — M549 Dorsalgia, unspecified: Secondary | ICD-10-CM | POA: Diagnosis not present

## 2013-10-16 ENCOUNTER — Ambulatory Visit (AMBULATORY_SURGERY_CENTER): Payer: Self-pay

## 2013-10-16 VITALS — Ht 65.0 in | Wt 165.0 lb

## 2013-10-16 DIAGNOSIS — Z860100 Personal history of colon polyps, unspecified: Secondary | ICD-10-CM

## 2013-10-16 MED ORDER — MOVIPREP 100 G PO SOLR: 1.0000 | Freq: Once | ORAL | Status: DC

## 2013-10-17 ENCOUNTER — Telehealth: Payer: Self-pay | Admitting: Gastroenterology

## 2013-10-17 NOTE — Telephone Encounter (Signed)
Pt's procedure is scheduled for 2-13 so she will come by the [facility] and pick up a prep sample either Monday 2-9 or Tuesday 2-10. Prep sample placed at [facility] front desk with pts name attached 10-17-13. Pt called and informed and was very grateful and states she will pick up as directed. ewm

## 2013-10-20 ENCOUNTER — Telehealth: Payer: Self-pay | Admitting: Gastroenterology

## 2013-10-20 NOTE — Telephone Encounter (Signed)
Talked with pt.  She understands that it will be better to start prep at 5:00 and agrees to begin prep at 5:00

## 2013-10-24 ENCOUNTER — Encounter: Payer: Medicare Other | Admitting: Gastroenterology

## 2013-10-24 ENCOUNTER — Encounter: Payer: Self-pay | Admitting: Gastroenterology

## 2013-10-24 ENCOUNTER — Ambulatory Visit (AMBULATORY_SURGERY_CENTER): Payer: Medicare Other | Admitting: Gastroenterology

## 2013-10-24 VITALS — BP 118/65 | HR 58 | Temp 96.4°F | Resp 18 | Ht 65.0 in | Wt 165.0 lb

## 2013-10-24 DIAGNOSIS — Z1211 Encounter for screening for malignant neoplasm of colon: Secondary | ICD-10-CM | POA: Diagnosis not present

## 2013-10-24 DIAGNOSIS — I10 Essential (primary) hypertension: Secondary | ICD-10-CM | POA: Diagnosis not present

## 2013-10-24 DIAGNOSIS — Z86010 Personal history of colonic polyps: Secondary | ICD-10-CM | POA: Diagnosis not present

## 2013-10-24 MED ORDER — SODIUM CHLORIDE 0.9 % IV SOLN: 500.0000 mL | INTRAVENOUS | Status: DC

## 2013-10-24 NOTE — Progress Notes (Signed)
No egg or soy allergy. ewm No problems with past sedation. ewm

## 2013-10-24 NOTE — Op Note (Signed)
[facility]  [address]. [place], [address]   COLONOSCOPY PROCEDURE REPORT  PATIENT: [name], [name]  MR#: [id number] BIRTHDATE: July 24, 1941 , 72  yrs. old GENDER: Female ENDOSCOPIST: Ladene Artist, MD, [facility] PROCEDURE DATE:  10/24/2013 PROCEDURE:   Colonoscopy, surveillance First Screening Colonoscopy - Avg.  risk and is 50 yrs.  old or older - No.  Prior Negative Screening - Now for repeat screening. N/A  History of Adenoma - Now for follow-up colonoscopy & has been > or = to 3 yrs.  Yes hx of adenoma.  Has been 3 or more years since last colonoscopy.  Polyps Removed Today? No.  Recommend repeat exam, <10 yrs? Yes.  High risk (family or personal hx). ASA CLASS:   Class II INDICATIONS:Patient's personal history of adenomatous colon polyps.  MEDICATIONS: MAC sedation, administered by CRNA and propofol (Diprivan) 200mg  IV DESCRIPTION OF PROCEDURE:   After the risks benefits and alternatives of the procedure were thoroughly explained, informed consent was obtained.  A digital rectal exam revealed no abnormalities of the rectum.   The [id number]  endoscope was introduced through the anus and advanced to the cecum, which was identified by both the appendix and ileocecal valve. No adverse events experienced.   The quality of the prep was excellent, using MoviPrep  The instrument was then slowly withdrawn as the colon was fully examined.   COLON FINDINGS: A normal appearing cecum, ileocecal valve, and appendiceal orifice were identified.  The ascending, hepatic flexure, transverse, splenic flexure, descending, sigmoid colon and rectum appeared unremarkable.  No polyps or cancers were seen. Retroflexed views revealed no abnormalities. The time to cecum=3 minutes 19 seconds.  Withdrawal time=8 minutes 58 seconds.  The scope was withdrawn and the procedure completed.  COMPLICATIONS: There were no complications.  ENDOSCOPIC IMPRESSION: 1.  Normal  colon  RECOMMENDATIONS: 1.  Repeat Colonoscopy in 5 years.  eSigned:  Ladene Artist, MD, [facility] 10/24/2013 8:23 AM

## 2013-10-24 NOTE — Patient Instructions (Addendum)
YOU HAD AN ENDOSCOPIC PROCEDURE TODAY AT THE [facility]: Refer to the procedure report that was given to you for any specific questions about what was found during the examination.  If the procedure report does not answer your questions, please call your gastroenterologist to clarify.  If you requested that your care partner not be given the details of your procedure findings, then the procedure report has been included in a sealed envelope for you to review at your convenience later.  YOU SHOULD EXPECT: Some feelings of bloating in the abdomen. Passage of more gas than usual.  Walking can help get rid of the air that was put into your GI tract during the procedure and reduce the bloating. If you had a lower endoscopy (such as a colonoscopy or flexible sigmoidoscopy) you may notice spotting of blood in your stool or on the toilet paper. If you underwent a bowel prep for your procedure, then you may not have a normal bowel movement for a few days.  DIET: Your first meal following the procedure should be a light meal and then it is ok to progress to your normal diet.  A half-sandwich or bowl of soup is an example of a good first meal.  Heavy or fried foods are harder to digest and may make you feel nauseous or bloated.  Likewise meals heavy in dairy and vegetables can cause extra gas to form and this can also increase the bloating.  Drink plenty of fluids but you should avoid alcoholic beverages for 24 hours.  ACTIVITY: Your care partner should take you home directly after the procedure.  You should plan to take it easy, moving slowly for the rest of the day.  You can resume normal activity the day after the procedure however you should NOT DRIVE or use heavy machinery for 24 hours (because of the sedation medicines used during the test).    SYMPTOMS TO REPORT IMMEDIATELY: A gastroenterologist can be reached at any hour.  During normal business hours, 8:30 AM to 5:00 PM Monday through Friday,  call [phone].  After hours and on weekends, please call the GI answering service at [phone] who will take a message and have the physician on call contact you.   Following lower endoscopy (colonoscopy or flexible sigmoidoscopy):  Excessive amounts of blood in the stool  Significant tenderness or worsening of abdominal pains  Swelling of the abdomen that is new, acute  Fever of 100F or higher  FOLLOW UP: If any biopsies were taken you will be contacted by phone or by letter within the next 1-3 weeks.  Call your gastroenterologist if you have not heard about the biopsies in 3 weeks.  Our staff will call the home number listed on your records the next business day following your procedure to check on you and address any questions or concerns that you may have at that time regarding the information given to you following your procedure. This is a courtesy call and so if there is no answer at the home number and we have not heard from you through the emergency physician on call, we will assume that you have returned to your regular daily activities without incident.  SIGNATURES/CONFIDENTIALITY: You and/or your care partner have signed paperwork which will be entered into your electronic medical record.  These signatures attest to the fact that that the information above on your After Visit Summary has been reviewed and is understood.  Full responsibility of the confidentiality of this   discharge information lies with you and/or your care-partner.  Normal exam.  Repeat colonoscopy in 5 years.

## 2013-10-27 ENCOUNTER — Telehealth: Payer: Self-pay | Admitting: *Deleted

## 2013-10-27 NOTE — Telephone Encounter (Signed)
  Follow up Call-  Call back number 10/24/2013  Post procedure Call Back phone  # [phone]  [phone] is her cell  Permission to leave phone message Yes     Patient questions:  Do you have a fever, pain , or abdominal swelling? no Pain Score  0 *  Have you tolerated food without any problems? yes  Have you been able to return to your normal activities? yes  Do you have any questions about your discharge instructions: Diet   no Medications  no Follow up visit  no  Do you have questions or concerns about your Care? no  Actions: * If pain score is 4 or above: No action needed, pain <4.

## 2013-10-31 ENCOUNTER — Ambulatory Visit: Payer: Medicare Other | Admitting: Internal Medicine

## 2013-11-18 DIAGNOSIS — M545 Low back pain, unspecified: Secondary | ICD-10-CM | POA: Diagnosis not present

## 2013-11-18 DIAGNOSIS — M48062 Spinal stenosis, lumbar region with neurogenic claudication: Secondary | ICD-10-CM | POA: Diagnosis not present

## 2013-11-18 DIAGNOSIS — IMO0002 Reserved for concepts with insufficient information to code with codable children: Secondary | ICD-10-CM | POA: Diagnosis not present

## 2013-11-18 DIAGNOSIS — Z981 Arthrodesis status: Secondary | ICD-10-CM | POA: Diagnosis not present

## 2013-11-18 DIAGNOSIS — M546 Pain in thoracic spine: Secondary | ICD-10-CM | POA: Diagnosis not present

## 2013-12-03 ENCOUNTER — Encounter: Payer: Self-pay | Admitting: Internal Medicine

## 2013-12-03 ENCOUNTER — Other Ambulatory Visit: Payer: Self-pay | Admitting: Internal Medicine

## 2013-12-03 ENCOUNTER — Ambulatory Visit (INDEPENDENT_AMBULATORY_CARE_PROVIDER_SITE_OTHER): Payer: Medicare Other | Admitting: Internal Medicine

## 2013-12-03 VITALS — BP 111/69 | HR 69 | Temp 97.7°F | Wt 172.0 lb

## 2013-12-03 DIAGNOSIS — Z79899 Other long term (current) drug therapy: Secondary | ICD-10-CM | POA: Diagnosis not present

## 2013-12-03 DIAGNOSIS — L989 Disorder of the skin and subcutaneous tissue, unspecified: Secondary | ICD-10-CM

## 2013-12-03 DIAGNOSIS — I10 Essential (primary) hypertension: Secondary | ICD-10-CM

## 2013-12-03 DIAGNOSIS — F411 Generalized anxiety disorder: Secondary | ICD-10-CM

## 2013-12-03 DIAGNOSIS — M1990 Unspecified osteoarthritis, unspecified site: Secondary | ICD-10-CM | POA: Diagnosis not present

## 2013-12-03 LAB — BASIC METABOLIC PANEL
BUN: 22 mg/dL (ref 6–23)
CO2: 29 mEq/L (ref 19–32)
Calcium: 9.8 mg/dL (ref 8.4–10.5)
Chloride: 98 mEq/L (ref 96–112)
Creatinine, Ser: 1 mg/dL (ref 0.4–1.2)
GFR: 57.11 mL/min — ABNORMAL LOW (ref >60.00)
Glucose, Bld: 89 mg/dL (ref 70–99)
Potassium: 3.8 mEq/L (ref 3.5–5.1)
Sodium: 136 mEq/L (ref 135–145)

## 2013-12-03 LAB — SEDIMENTATION RATE: Sed Rate: 10 mm/hr (ref 0–22)

## 2013-12-03 MED ORDER — LORAZEPAM 0.5 MG PO TABS: 0.5000 mg | ORAL_TABLET | Freq: Every evening | ORAL | Status: DC | PRN

## 2013-12-03 MED ORDER — GABAPENTIN 400 MG PO CAPS: 400.0000 mg | ORAL_CAPSULE | Freq: Three times a day (TID) | ORAL | Status: DC

## 2013-12-03 MED ORDER — METHOCARBAMOL 750 MG PO TABS: 750.0000 mg | ORAL_TABLET | Freq: Four times a day (QID) | ORAL | Status: DC

## 2013-12-03 NOTE — Assessment & Plan Note (Signed)
Well-controlled, needs a refill and UDS

## 2013-12-03 NOTE — Progress Notes (Signed)
Subjective:    Patient ID: [name], female    DOB: September 25, 1940, 73 y.o.   MRN: [id number]  DOS:  12/03/2013 Type of  visit:  ROV Concerned about 2 skin lesions in the back. OA, concerned that she may have rheumatoid arthritiss. She has a long history of joint pains, hands are somehow stiff in the morning. Pain is well controlled with current meds. Patient request me to start RF her medications previously refilled  by orthopedic surgery. Hypertension, normal ambulatory BPs.    ROS Denies fever chills or weight loss. No rash. No chest or difficulty breathing Denies nausea, vomiting, diarrhea, no blood in the stools or abdominal pain   Past Medical History  Diagnosis Date  . History of elevated homocysteine   . Thyroid nodule     rt benign colloid nodule--left hyperplastic nodule- Thyroid bx 2006: neg  . Baker's cyst   . Goiter   . Adenomatous colon polyp 03/2003  . PONV (postoperative nausea and vomiting)   . Anxiety     ativan for sleep  . OA (osteoarthritis)      knee pain after TKR left , back pain- last steroid injection back 2 months ago Dr Nelva Bush  . Hypertension 09/11/2005    Past Surgical History  Procedure Laterality Date  . Abdominal hysterectomy    . Oophorectomy    . Knee arthroscopy      L   replacement 2007, Dr Corinne Ports, then a manipulation, then redo TKR 11/09 w/ Dr.Alusio. Parcial replacement again 09-2010  . Total hip arthroplasty  3/11    Right   . Facelift    . Incisional hernia repair    . Knee surgery      x 5-  left  . Shoulder surgery      replacements: L 2010, R 2012  . Bakers cystectomy      right knee  . Total knee arthroplasty  05/20/2012    Procedure: TOTAL KNEE ARTHROPLASTY;  Surgeon: Gearlean Alf, MD;  Location: [facility];  Service: Orthopedics;  Laterality: Right;  . Back surgery  10-01-2012    at [facility], lumbar    History   Social History  . Marital Status: Married    Spouse Name: N/A    Number of Children: 2  . Years of Education:  N/A   Occupational History  . Retired Pharmacist, hospital     retired  .     Social History Main Topics  . Smoking status: Former Smoker    Quit date: 09/12/1983  . Smokeless tobacco: Never Used  . Alcohol Use: No  . Drug Use: No  . Sexual Activity: Not on file   Other Topics Concern  . Not on file   Social History Narrative   Lives w/ husband of 34 years                 Medication List       This list is accurate as of: 12/03/13  6:52 PM.  Always use your most recent med list.               aspirin 81 MG tablet  Take 81 mg by mouth daily.     CALCIUM + D PO  Take 600 mg by mouth daily after breakfast.     gabapentin 400 MG capsule  Commonly known as:  NEURONTIN  TAKE 1 CAPSULE BY MOUTH THREE TIMES DAILY     HYDROcodone-acetaminophen 5-325 MG per tablet  Commonly known  as:  NORCO/VICODIN     hydrocortisone 2.5 % cream  Apply 1 application topically daily.     lisinopril-hydrochlorothiazide 20-12.5 MG per tablet  Commonly known as:  PRINZIDE,ZESTORETIC  TAKE 1 TABLET BY MOUTH EVERY DAY     LORazepam 0.5 MG tablet  Commonly known as:  ATIVAN  Take 1-2 tablets (0.5-1 mg total) by mouth at bedtime as needed for sleep.     methocarbamol 750 MG tablet  Commonly known as:  ROBAXIN  Take 1 tablet (750 mg total) by mouth 4 (four) times daily.     tretinoin 0.1 % cream  Commonly known as:  RETIN-A  Apply 1 application topically at bedtime.           Objective:   Physical Exam BP 111/69  Pulse 69  Temp(Src) 97.7 F (36.5 C)  Wt 172 lb (78.019 kg)  SpO2 97%  General -- alert, well-developed, NAD.  Lungs -- normal respiratory effort, no intercostal retractions, no accessory muscle use, and normal breath sounds.  Heart-- normal rate, regular rhythm, ?soft syst murmur.  Extremities-- no pretibial edema bilaterally ; Hands with joint deformities consistent with OA, no synovitis on exam Neurologic--  alert & oriented X3. Speech normal, gait normal, strength  normal in all extremities.   skin-- 2 skin lesions in the back,~ 3 mm, soft, pinkish in color, no bleeding or ulceration. One of them looks like a typical skin tag  Psych-- Cognition and judgment appear intact. Cooperative with normal attention span and concentration. No anxious or depressed appearing.       Assessment & Plan:    Skin lesions-- Likely benign, recommend observation, will call if changing color, size or bleeding

## 2013-12-03 NOTE — Assessment & Plan Note (Signed)
Controlled, check a BMP 

## 2013-12-03 NOTE — Patient Instructions (Signed)
Get your blood work before you leave , also a urine sample UDS  Next visit is for a physical exam in 6 months ,  fasting Please make an appointment

## 2013-12-03 NOTE — Assessment & Plan Note (Addendum)
Symptoms well-controlled with   Robaxin, gabapentin, naproxen and Prevacid as needed, tolerated NSAIDs well. Asked me to assume the refill of her medications Tylenol did not help before. occ uses hydrocodone She wonders about RA, I think she has OA but will screen for auto immune diseases with a sedimentation rate, ANA and rheumatoid factors

## 2013-12-03 NOTE — Progress Notes (Signed)
Pre visit review using our clinic review tool, if applicable. No additional management support is needed unless otherwise documented below in the visit note. 

## 2013-12-04 ENCOUNTER — Other Ambulatory Visit: Payer: Self-pay | Admitting: Internal Medicine

## 2013-12-04 ENCOUNTER — Telehealth: Payer: Self-pay | Admitting: Internal Medicine

## 2013-12-04 LAB — RHEUMATOID FACTOR: Rhuematoid fact SerPl-aCnc: 11 IU/mL (ref <=14)

## 2013-12-04 LAB — ANA: Anti Nuclear Antibody(ANA): NEGATIVE

## 2013-12-04 NOTE — Telephone Encounter (Signed)
emmi mailing mailed to patient °

## 2013-12-08 ENCOUNTER — Encounter: Payer: Self-pay | Admitting: *Deleted

## 2013-12-16 ENCOUNTER — Encounter: Payer: Self-pay | Admitting: Internal Medicine

## 2013-12-16 ENCOUNTER — Ambulatory Visit (INDEPENDENT_AMBULATORY_CARE_PROVIDER_SITE_OTHER): Payer: Medicare Other | Admitting: Internal Medicine

## 2013-12-16 ENCOUNTER — Other Ambulatory Visit: Payer: Self-pay | Admitting: Internal Medicine

## 2013-12-16 VITALS — BP 144/71 | HR 71 | Temp 98.2°F | Wt 177.0 lb

## 2013-12-16 DIAGNOSIS — R609 Edema, unspecified: Secondary | ICD-10-CM

## 2013-12-16 DIAGNOSIS — M1990 Unspecified osteoarthritis, unspecified site: Secondary | ICD-10-CM | POA: Diagnosis not present

## 2013-12-16 DIAGNOSIS — R600 Localized edema: Secondary | ICD-10-CM | POA: Insufficient documentation

## 2013-12-16 MED ORDER — FUROSEMIDE 20 MG PO TABS: 20.0000 mg | ORAL_TABLET | Freq: Every day | ORAL | Status: DC | PRN

## 2013-12-16 NOTE — Progress Notes (Signed)
Pre visit review using our clinic review tool, if applicable. No additional management support is needed unless otherwise documented below in the visit note. 

## 2013-12-16 NOTE — Progress Notes (Signed)
Subjective:    Patient ID: [name], female    DOB: 1940/10/22, 73 y.o.   MRN: [id number]  DOS:  12/16/2013 Type of  Visit: Acute visit   Symptoms started about a week ago: She developed swelling around the knees and subsequently around the ankles feet. She felt bloated, her hands and fingers are also swollen. Reports no recent increase in sodium intake. On further questioning, after I saw 12/03/2013, she started taking naproxen daily when before she was taking it sporadically. She discontinue naproxen as she felt that maybe the culprit of swelling (I agree)  ROS Denies fever or chills. No chest pain, difficulty breathing, palpitations. No orthopnea. No  nausea, vomiting, diarrhea or blood in the stools.   Past Medical History  Diagnosis Date  . History of elevated homocysteine   . Thyroid nodule     rt benign colloid nodule--left hyperplastic nodule- Thyroid bx 2006: neg  . Baker's cyst   . Goiter   . Adenomatous colon polyp 03/2003  . PONV (postoperative nausea and vomiting)   . Anxiety     ativan for sleep  . OA (osteoarthritis)      knee pain after TKR left , back pain- last steroid injection back 2 months ago Dr Nelva Bush  . Hypertension 09/11/2005    Past Surgical History  Procedure Laterality Date  . Abdominal hysterectomy    . Oophorectomy    . Knee arthroscopy      L   replacement 2007, Dr Corinne Ports, then a manipulation, then redo TKR 11/09 w/ Dr.Alusio. Parcial replacement again 09-2010  . Total hip arthroplasty  3/11    Right   . Facelift    . Incisional hernia repair    . Knee surgery      x 5-  left  . Shoulder surgery      replacements: L 2010, R 2012  . Bakers cystectomy      right knee  . Total knee arthroplasty  05/20/2012    Procedure: TOTAL KNEE ARTHROPLASTY;  Surgeon: Gearlean Alf, MD;  Location: [facility];  Service: Orthopedics;  Laterality: Right;  . Back surgery  10-01-2012    at [facility], lumbar    History   Social History  . Marital Status:  Married    Spouse Name: N/A    Number of Children: 2  . Years of Education: N/A   Occupational History  . Retired Pharmacist, hospital     retired  .     Social History Main Topics  . Smoking status: Former Smoker    Quit date: 09/12/1983  . Smokeless tobacco: Never Used  . Alcohol Use: No  . Drug Use: No  . Sexual Activity: Not on file   Other Topics Concern  . Not on file   Social History Narrative   Lives w/ husband of 41 years                 Medication List       This list is accurate as of: 12/16/13  6:25 PM.  Always use your most recent med list.               aspirin 81 MG tablet  Take 81 mg by mouth daily.     CALCIUM + D PO  Take 600 mg by mouth daily after breakfast.     furosemide 20 MG tablet  Commonly known as:  LASIX  Take 1 tablet (20 mg total) by mouth daily as needed.  gabapentin 800 MG tablet  Commonly known as:  NEURONTIN  Take 800 mg by mouth 3 (three) times daily.     HYDROcodone-acetaminophen 5-325 MG per tablet  Commonly known as:  NORCO/VICODIN     hydrocortisone 2.5 % cream  Apply 1 application topically daily.     lisinopril-hydrochlorothiazide 20-12.5 MG per tablet  Commonly known as:  PRINZIDE,ZESTORETIC  TAKE 1 TABLET BY MOUTH EVERY DAY     LORazepam 0.5 MG tablet  Commonly known as:  ATIVAN  Take 1-2 tablets (0.5-1 mg total) by mouth at bedtime as needed for sleep.     methocarbamol 750 MG tablet  Commonly known as:  ROBAXIN  Take 1 tablet (750 mg total) by mouth 4 (four) times daily.     omeprazole 20 MG capsule  Commonly known as:  PRILOSEC  TAKE ONE CAPSULE BY MOUTH DAILY     tretinoin 0.1 % cream  Commonly known as:  RETIN-A  Apply 1 application topically at bedtime.           Objective:   Physical Exam BP 144/71  Pulse 71  Temp(Src) 98.2 F (36.8 C)  Wt 177 lb (80.287 kg)  SpO2 99% General -- alert, well-developed, NAD.  Neck --no JVD at 45 HEENT-- Not pale.   Lungs -- normal respiratory effort, no  intercostal retractions, no accessory muscle use, and normal breath sounds.  Heart-- normal rate, regular rhythm, no murmur.  Abdomen-- Not distended, good bowel sounds,soft, non-tender. Extremities--   Knees are slightly swollen. +/+++ Pretibial, ankle edema. Skin without redness or warmness. Neurologic--  alert & oriented X3. Speech normal, gait normal, strength normal in all extremities.   Psych-- Cognition and judgment appear intact. Cooperative with normal attention span and concentration. No anxious or depressed appearing.      Assessment & Plan:

## 2013-12-16 NOTE — Assessment & Plan Note (Signed)
Lower extremity edema for a few days, has gained 5 pounds per our scale since her last visit. Doubt a cardiac event given lack of symptoms, EKG today normal. Symptoms probably related to naproxen. Plan: DC naproxen, BMP, Lasix temporarily, return to the office in 2 weeks

## 2013-12-16 NOTE — Patient Instructions (Signed)
Get your blood work before you leave  stop naproxen Lasix one tablet a day for 3 or 4 days Please come back in 2 weeks  Call anytime if you get worse

## 2013-12-16 NOTE — Assessment & Plan Note (Signed)
See previous entry, labs were okay, she started to take naproxen daily and has developed edema. Plan: Discontinue naproxen

## 2013-12-17 LAB — BASIC METABOLIC PANEL
BUN: 20 mg/dL (ref 6–23)
CO2: 29 mEq/L (ref 19–32)
Calcium: 9.7 mg/dL (ref 8.4–10.5)
Chloride: 100 mEq/L (ref 96–112)
Creatinine, Ser: 0.9 mg/dL (ref 0.4–1.2)
GFR: 66.08 mL/min (ref >60.00)
Glucose, Bld: 90 mg/dL (ref 70–99)
Potassium: 3.8 mEq/L (ref 3.5–5.1)
Sodium: 138 mEq/L (ref 135–145)

## 2013-12-18 ENCOUNTER — Telehealth: Payer: Self-pay | Admitting: *Deleted

## 2013-12-18 NOTE — Telephone Encounter (Signed)
Pt requesting #90 tablet refill on lasix 20mg  Last OV- 12/16/13  Dr. Paz - Last refilled 12/16/13 #15/ 3 Rf   

## 2013-12-18 NOTE — Telephone Encounter (Signed)
Pt requesting #90 tablet refill on lasix 20mg   Last OV- 12/16/13  Dr. Larose Kells - Last refilled 12/16/13 #15/ 3 Rf

## 2013-12-19 NOTE — Telephone Encounter (Signed)
Pt states did not request #90 for lasix must be error on pharmacy. Pt also states stopped taking lasix yesterday, has no more swelling.

## 2013-12-19 NOTE — Telephone Encounter (Signed)
Advise patient, She just needed Lasix temporarily, if she ended up needing it long-term I will prescribe 90 otherwise advised to get from local pharmacy

## 2013-12-19 NOTE — Telephone Encounter (Signed)
thx

## 2013-12-22 ENCOUNTER — Telehealth: Payer: Self-pay

## 2013-12-22 NOTE — Telephone Encounter (Signed)
UDS: 12/03/2013 Positive for lorazepam Low risk per Dr Larose Kells

## 2013-12-25 DIAGNOSIS — J019 Acute sinusitis, unspecified: Secondary | ICD-10-CM | POA: Diagnosis not present

## 2013-12-25 DIAGNOSIS — J209 Acute bronchitis, unspecified: Secondary | ICD-10-CM | POA: Diagnosis not present

## 2013-12-30 ENCOUNTER — Ambulatory Visit: Payer: Medicare Other | Admitting: Internal Medicine

## 2013-12-30 DIAGNOSIS — Z0289 Encounter for other administrative examinations: Secondary | ICD-10-CM

## 2014-01-01 NOTE — Telephone Encounter (Signed)
Refill for lasix, 90 day supply sent to [facility] in [place]

## 2014-01-06 ENCOUNTER — Other Ambulatory Visit: Payer: Self-pay | Admitting: Internal Medicine

## 2014-01-09 ENCOUNTER — Encounter: Payer: Self-pay | Admitting: Internal Medicine

## 2014-02-03 ENCOUNTER — Other Ambulatory Visit: Payer: Self-pay | Admitting: Internal Medicine

## 2014-02-03 ENCOUNTER — Telehealth: Payer: Self-pay | Admitting: *Deleted

## 2014-02-03 NOTE — Telephone Encounter (Signed)
Lorazepam 0.5mg  Last OV- 12/16/13 Last refilled- 12/03/13 #60 / 3rf  UDS- 12/03/13 LOW risk.

## 2014-02-11 ENCOUNTER — Ambulatory Visit (HOSPITAL_COMMUNITY)
Admission: RE | Admit: 2014-02-11 | Discharge: 2014-02-11 | Disposition: A | Discharge status: Home / Self Care | Payer: Medicare Other | Source: Ambulatory Visit | Attending: Cardiovascular Disease | Admitting: Cardiovascular Disease

## 2014-02-11 ENCOUNTER — Other Ambulatory Visit (HOSPITAL_COMMUNITY): Payer: Self-pay | Admitting: Physician Assistant

## 2014-02-11 DIAGNOSIS — M7989 Other specified soft tissue disorders: Secondary | ICD-10-CM | POA: Diagnosis not present

## 2014-02-11 DIAGNOSIS — Z471 Aftercare following joint replacement surgery: Secondary | ICD-10-CM | POA: Diagnosis not present

## 2014-02-11 DIAGNOSIS — M79609 Pain in unspecified limb: Secondary | ICD-10-CM

## 2014-02-11 DIAGNOSIS — Z96659 Presence of unspecified artificial knee joint: Secondary | ICD-10-CM

## 2014-02-11 DIAGNOSIS — R609 Edema, unspecified: Secondary | ICD-10-CM

## 2014-02-11 DIAGNOSIS — R600 Localized edema: Secondary | ICD-10-CM

## 2014-02-11 NOTE — Progress Notes (Signed)
Left Lower Ext. Venous Duplex Completed. Negative for DVT. Addysen Louth, BS, RDMS, RVT  

## 2014-02-12 ENCOUNTER — Telehealth (HOSPITAL_COMMUNITY): Payer: Self-pay | Admitting: *Deleted

## 2014-02-18 ENCOUNTER — Telehealth (HOSPITAL_COMMUNITY): Payer: Self-pay | Admitting: *Deleted

## 2014-03-11 ENCOUNTER — Telehealth: Payer: Self-pay | Admitting: *Deleted

## 2014-03-11 NOTE — Telephone Encounter (Addendum)
Called and spoke with the pt regarding the Lidocaine patch prior authorization we received.  Needed to know when the pt was prescribed the prescription by Dr. Larose Kells.  Pt stated that Dr. Larose Kells did not prescribe the prescription it was done by the doctor at [facility].  She stated that she did not know where the pharmacy was going to send the request.  She asked if I would ask Dr. Larose Kells if he would fill out the request if not she will call [facility].//AB/CMA

## 2014-03-11 NOTE — Telephone Encounter (Signed)
Received prior authorization request from [facility].  Waiting prior authorization request form from [facility].//AB/CMA

## 2014-03-12 NOTE — Telephone Encounter (Signed)
LMOM @ (2:08pm) informing the pt that I spoke with Dr. Larose Kells regarding filling out the prior auth and he stated that she will need to call [facility] and have the doctor who did her surgery and prescribed the med fill out the authorization.  Asked the pt to give [place] a call back if she has any questions, and also informed her that I will give her a call back on Monday (03-16-14) to make sure she got the message.//AB/CMA

## 2014-04-10 DIAGNOSIS — M76899 Other specified enthesopathies of unspecified lower limb, excluding foot: Secondary | ICD-10-CM | POA: Diagnosis not present

## 2014-04-10 DIAGNOSIS — Z96649 Presence of unspecified artificial hip joint: Secondary | ICD-10-CM | POA: Diagnosis not present

## 2014-05-04 DIAGNOSIS — M79609 Pain in unspecified limb: Secondary | ICD-10-CM | POA: Diagnosis not present

## 2014-05-11 ENCOUNTER — Emergency Department (HOSPITAL_COMMUNITY): Payer: Medicare Other

## 2014-05-11 ENCOUNTER — Encounter (HOSPITAL_COMMUNITY): Payer: Self-pay | Admitting: Emergency Medicine

## 2014-05-11 ENCOUNTER — Emergency Department (HOSPITAL_COMMUNITY)
Admission: EM | Admit: 2014-05-11 | Discharge: 2014-05-11 | Disposition: A | Discharge status: Home / Self Care | Payer: Medicare Other | Attending: Emergency Medicine | Admitting: Emergency Medicine

## 2014-05-11 DIAGNOSIS — M5430 Sciatica, unspecified side: Secondary | ICD-10-CM | POA: Diagnosis not present

## 2014-05-11 DIAGNOSIS — IMO0002 Reserved for concepts with insufficient information to code with codable children: Secondary | ICD-10-CM | POA: Insufficient documentation

## 2014-05-11 DIAGNOSIS — M1990 Unspecified osteoarthritis, unspecified site: Secondary | ICD-10-CM | POA: Diagnosis not present

## 2014-05-11 DIAGNOSIS — Z860100 Personal history of colon polyps, unspecified: Secondary | ICD-10-CM | POA: Diagnosis not present

## 2014-05-11 DIAGNOSIS — F411 Generalized anxiety disorder: Secondary | ICD-10-CM | POA: Insufficient documentation

## 2014-05-11 DIAGNOSIS — Z862 Personal history of diseases of the blood and blood-forming organs and certain disorders involving the immune mechanism: Secondary | ICD-10-CM | POA: Diagnosis not present

## 2014-05-11 DIAGNOSIS — M169 Osteoarthritis of hip, unspecified: Secondary | ICD-10-CM | POA: Diagnosis not present

## 2014-05-11 DIAGNOSIS — Z87891 Personal history of nicotine dependence: Secondary | ICD-10-CM | POA: Insufficient documentation

## 2014-05-11 DIAGNOSIS — Z96649 Presence of unspecified artificial hip joint: Secondary | ICD-10-CM | POA: Diagnosis not present

## 2014-05-11 DIAGNOSIS — Z79899 Other long term (current) drug therapy: Secondary | ICD-10-CM | POA: Insufficient documentation

## 2014-05-11 DIAGNOSIS — Z90710 Acquired absence of both cervix and uterus: Secondary | ICD-10-CM | POA: Diagnosis not present

## 2014-05-11 DIAGNOSIS — M25559 Pain in unspecified hip: Secondary | ICD-10-CM | POA: Diagnosis not present

## 2014-05-11 DIAGNOSIS — I10 Essential (primary) hypertension: Secondary | ICD-10-CM | POA: Diagnosis not present

## 2014-05-11 DIAGNOSIS — Z8639 Personal history of other endocrine, nutritional and metabolic disease: Secondary | ICD-10-CM | POA: Insufficient documentation

## 2014-05-11 DIAGNOSIS — Z96659 Presence of unspecified artificial knee joint: Secondary | ICD-10-CM | POA: Insufficient documentation

## 2014-05-11 DIAGNOSIS — M5432 Sciatica, left side: Secondary | ICD-10-CM

## 2014-05-11 DIAGNOSIS — M1610 Unilateral primary osteoarthritis, unspecified hip: Secondary | ICD-10-CM | POA: Diagnosis not present

## 2014-05-11 MED ORDER — KETOROLAC TROMETHAMINE 30 MG/ML IJ SOLN
30.0000 mg | Freq: Once | INTRAMUSCULAR | Status: AC
  Administered 2014-05-11: 30 mg via INTRAVENOUS
  Filled 2014-05-11: qty 1

## 2014-05-11 MED ORDER — MORPHINE SULFATE 4 MG/ML IJ SOLN
6.0000 mg | Freq: Once | INTRAMUSCULAR | Status: AC
  Administered 2014-05-11: 6 mg via INTRAVENOUS
  Filled 2014-05-11: qty 2

## 2014-05-11 MED ORDER — ONDANSETRON HCL 4 MG/2ML IJ SOLN
4.0000 mg | Freq: Once | INTRAMUSCULAR | Status: AC
  Administered 2014-05-11: 4 mg via INTRAVENOUS
  Filled 2014-05-11: qty 2

## 2014-05-11 MED ORDER — ONDANSETRON 8 MG PO TBDP: 8.0000 mg | ORAL_TABLET | Freq: Three times a day (TID) | ORAL | Status: DC | PRN

## 2014-05-11 MED ORDER — DEXAMETHASONE SODIUM PHOSPHATE 10 MG/ML IJ SOLN
10.0000 mg | Freq: Once | INTRAMUSCULAR | Status: AC
  Administered 2014-05-11: 10 mg via INTRAVENOUS
  Filled 2014-05-11: qty 1

## 2014-05-11 MED ORDER — OXYCODONE-ACETAMINOPHEN 5-325 MG PO TABS: 1.0000 | ORAL_TABLET | ORAL | Status: DC | PRN

## 2014-05-11 NOTE — ED Notes (Addendum)
Patient transported to X-ray 

## 2014-05-11 NOTE — ED Provider Notes (Signed)
CSN: [id number]     Arrival date & time 05/11/14  0728 History   First MD Initiated Contact with Patient 05/11/14 [phone]     Chief Complaint  Patient presents with  . Hip Pain     HPI Patient presents the emergency department approximately 12-14 hours of left buttock and left hip pain.  She denies trauma or injury.  No fevers or chills.  She did have a cortisone injection 2 weeks ago.  She denies numbness or weakness of her left lower extremity.  She has been able to walk around with a limp and a cane secondary to pain.  She reports the pain in her left buttock now seems to be spreading more down the lateral aspect of her left leg.  No fevers or chills.  Pain is moderate to severe in severity.  She tried an oxycodone this morning without significant improvement in her symptoms.  Denies rash.  Denies similar symptoms before.  She does report prior history of lumbar surgery at [facility] secondary to "nerve issue".    Past Medical History  Diagnosis Date  . History of elevated homocysteine   . Thyroid nodule     rt benign colloid nodule--left hyperplastic nodule- Thyroid bx 2006: neg  . Baker's cyst   . Goiter   . Adenomatous colon polyp 03/2003  . PONV (postoperative nausea and vomiting)   . Anxiety     ativan for sleep  . OA (osteoarthritis)      knee pain after TKR left , back pain- last steroid injection back 2 months ago Dr Nelva Bush  . Hypertension 09/11/2005   Past Surgical History  Procedure Laterality Date  . Abdominal hysterectomy    . Oophorectomy    . Knee arthroscopy      L   replacement 2007, Dr Corinne Ports, then a manipulation, then redo TKR 11/09 w/ Dr.Alusio. Parcial replacement again 09-2010  . Total hip arthroplasty  3/11    Right   . Facelift    . Incisional hernia repair    . Knee surgery      x 5-  left  . Shoulder surgery      replacements: L 2010, R 2012  . Bakers cystectomy      right knee  . Total knee arthroplasty  05/20/2012    Procedure: TOTAL KNEE  ARTHROPLASTY;  Surgeon: Gearlean Alf, MD;  Location: [facility];  Service: Orthopedics;  Laterality: Right;  . Back surgery  10-01-2012    at [facility], lumbar   Family History  Problem Relation Age of Onset  . Breast cancer Neg Hx   . Colon cancer Neg Hx   . Pancreatic cancer Neg Hx   . Stomach cancer Neg Hx   . Coronary artery disease Father     CABG at 73  . Prostate cancer Father   . Colon polyps Father   . Coronary artery disease Brother   . Colon polyps Brother    History  Substance Use Topics  . Smoking status: Former Smoker    Quit date: 09/12/1983  . Smokeless tobacco: Never Used  . Alcohol Use: No   OB History   Grav Para Term Preterm Abortions TAB SAB Ect Mult Living                 Review of Systems  All other systems reviewed and are negative.     Allergies  Tramadol hcl  Home Medications   Prior to Admission medications   Medication  Sig Start Date End Date Taking? Authorizing Provider  aspirin 81 MG tablet Take 81 mg by mouth daily.    Historical Provider, MD  Calcium Carbonate-Vitamin D (CALCIUM + D PO) Take 600 mg by mouth daily after breakfast.    Historical Provider, MD  furosemide (LASIX) 20 MG tablet TAKE 1 TABLET BY MOUTH EVERY DAY AS NEEDED    Colon Branch, MD  gabapentin (NEURONTIN) 800 MG tablet Take 800 mg by mouth 3 (three) times daily.    Historical Provider, MD  HYDROcodone-acetaminophen (NORCO/VICODIN) 5-325 MG per tablet  11/20/13   Historical Provider, MD  hydrocortisone 2.5 % cream Apply 1 application topically daily.  10/08/13   Historical Provider, MD  lisinopril-hydrochlorothiazide (PRINZIDE,ZESTORETIC) 20-12.5 MG per tablet TAKE 1 TABLET BY MOUTH DAILY 01/06/14   Colon Branch, MD  LORazepam (ATIVAN) 0.5 MG tablet Take 1-2 tablets (0.5-1 mg total) by mouth at bedtime as needed for sleep. 12/03/13   Colon Branch, MD  methocarbamol (ROBAXIN) 750 MG tablet Take 1 tablet (750 mg total) by mouth 4 (four) times daily. 12/03/13   Colon Branch, MD  omeprazole  (PRILOSEC) 20 MG capsule TAKE ONE CAPSULE BY MOUTH DAILY 12/04/13   Colon Branch, MD  tretinoin (RETIN-A) 0.1 % cream Apply 1 application topically at bedtime.  10/08/13   Historical Provider, MD   BP 179/91  Pulse 63  Temp(Src) 97.8 F (36.6 C) (Oral)  Resp 20  SpO2 97% Physical Exam  Nursing note and vitals reviewed. Constitutional: She is oriented to person, place, and time. She appears well-developed and well-nourished.  HENT:  Head: Normocephalic.  Eyes: EOM are normal.  Neck: Normal range of motion.  Pulmonary/Chest: Effort normal.  Abdominal: She exhibits no distension.  Musculoskeletal:  Normal PT and DP pulses bilaterally.  No unilateral leg swelling of the left lower extremity.  No rash of the left buttock region noted.  Full range of motion of left ankle, left knee, left hip.  No obvious shortening of the left lower extremity.  No discoloration of the left leg.  Mild tenderness in the left sciatic groove  Neurological: She is alert and oriented to person, place, and time.  Psychiatric: She has a normal mood and affect.    ED Course  Procedures (including critical care time) Labs Review Labs Reviewed - No data to display  Imaging Review Dg Hip Complete Left  05/11/2014   CLINICAL DATA:  Atraumatic left hip pain ; history of previous right hip arthroplasty  EXAM: LEFT HIP - COMPLETE 2+ VIEW  COMPARISON:  None.  FINDINGS: The bony pelvis is mildly osteopenic. No acute pelvic fracture is demonstrated. AP and lateral views of the left hip reveal mild symmetric narrowing of the joint space. The femoral head and neck and intertrochanteric regions are normal. The acetabulum exhibits no acute abnormality.  IMPRESSION: There is mild degenerative narrowing of the left hip joint space. No acute or significant chronic bony abnormality is demonstrated.   Electronically Signed   By: David  Martinique   On: 05/11/2014 08:15     EKG Interpretation None      MDM   Final diagnoses:  None     Patient feels much better after management with pain meds in the emergency department.  I suspect this is more of a sciatica-type presentation.  No weakness of the lower extremities.  Discharge home in good condition with outpatient PCP followup.  If her symptoms continue to persist she may benefit from outpatient  MRI of her low back    Hoy Morn, MD 05/13/14 [phone]

## 2014-05-11 NOTE — ED Notes (Signed)
MD at bedside. 

## 2014-05-11 NOTE — ED Notes (Signed)
Initial Contact - pt A+Ox4, ambulatory with slow steady gait to void in BR.  Skin PWD.  Speaking full/clear sentences.  NAD.

## 2014-05-11 NOTE — ED Notes (Addendum)
Pt presents with c/o left hip pain that started yesterday evening. Pt reports that the pain is in the top left area of her left buttocks. Pt reports pain with ambulation. Pt reports no injury, does report hx of back surgery in 2014. Pt reports her last dose of pain medication was at 6:00 this morning, oxycodone.

## 2014-05-13 ENCOUNTER — Other Ambulatory Visit: Payer: Self-pay | Admitting: Internal Medicine

## 2014-05-13 ENCOUNTER — Encounter: Payer: Self-pay | Admitting: Internal Medicine

## 2014-05-13 ENCOUNTER — Ambulatory Visit (INDEPENDENT_AMBULATORY_CARE_PROVIDER_SITE_OTHER): Payer: Medicare Other | Admitting: Internal Medicine

## 2014-05-13 VITALS — BP 118/68 | HR 69 | Temp 98.7°F | Wt 176.1 lb

## 2014-05-13 DIAGNOSIS — M1990 Unspecified osteoarthritis, unspecified site: Secondary | ICD-10-CM

## 2014-05-13 MED ORDER — PREDNISONE 10 MG PO TABS: ORAL_TABLET | ORAL | Status: DC

## 2014-05-13 MED ORDER — LACTULOSE 10 GM/15ML PO SOLN: 10.0000 g | Freq: Three times a day (TID) | ORAL | Status: DC | PRN

## 2014-05-13 NOTE — Progress Notes (Signed)
Subjective:    Patient ID: [name], female    DOB: October 25, 1940, 73 y.o.   MRN: [id number]  DOS:  05/13/2014 Type of visit - description : ED f/u Interval history: Woke up on  05/10/2014 w/  left-sided buttock and hip pain with radiation to left leg up to the 2-3th toe which are numb. The next day she went to the emergency room, x-rays show left hip DJD, she was given a shot and felt a slightly better. Initially, pain was 10/10, now is 4/10 and constant. She has back pain since her back surgery more than a year ago and that has not changed. She's taking hydrocodone and oxycodone, mostly  oxycodone because it helps the pain better. Has developed severe constipation in the last couple of days, OTCs no help   ROS  Denies fever chills; No recent fall or injury Has seen  fresh red blood per rectum in the last couple of days, mostly when she tries to go to the  bathroom and strains. No anal discomfort except with straining. No nausea, vomiting, abdominal pain. No bladder or bowel incontinence.    Past Medical History  Diagnosis Date  . History of elevated homocysteine   . Thyroid nodule     rt benign colloid nodule--left hyperplastic nodule- Thyroid bx 2006: neg  . Baker's cyst   . Goiter   . Adenomatous colon polyp 03/2003  . PONV (postoperative nausea and vomiting)   . Anxiety     ativan for sleep  . OA (osteoarthritis)      knee pain after TKR left , back pain- last steroid injection back 2 months ago Dr Nelva Bush  . Hypertension 09/11/2005    Past Surgical History  Procedure Laterality Date  . Abdominal hysterectomy    . Oophorectomy    . Knee arthroscopy      L   replacement 2007, Dr Corinne Ports, then a manipulation, then redo TKR 11/09 w/ Dr.Alusio. Parcial replacement again 09-2010  . Total hip arthroplasty  3/11    Right   . Facelift    . Incisional hernia repair    . Knee surgery      x 5-  left  . Shoulder surgery      replacements: L 2010, R 2012  . Bakers cystectomy       right knee  . Total knee arthroplasty  05/20/2012    Procedure: TOTAL KNEE ARTHROPLASTY;  Surgeon: Gearlean Alf, MD;  Location: [facility];  Service: Orthopedics;  Laterality: Right;  . Back surgery  11-09-2011    at [facility], lumbar    History   Social History  . Marital Status: Married    Spouse Name: N/A    Number of Children: 2  . Years of Education: N/A   Occupational History  . Retired Pharmacist, hospital     retired  .     Social History Main Topics  . Smoking status: Former Smoker    Quit date: 09/12/1983  . Smokeless tobacco: Never Used  . Alcohol Use: No  . Drug Use: No  . Sexual Activity: Not on file   Other Topics Concern  . Not on file   Social History Narrative   Lives w/ husband of 76 years                 Medication List       This list is accurate as of: 05/13/14  5:05 PM.  Always use your most recent med list.  aspirin 81 MG tablet  Take 81 mg by mouth daily.     CALCIUM + D PO  Take 600 mg by mouth daily after breakfast.     gabapentin 800 MG tablet  Commonly known as:  NEURONTIN  Take 800 mg by mouth 3 (three) times daily.     HYDROcodone-acetaminophen 5-325 MG per tablet  Commonly known as:  NORCO/VICODIN  Take 1 tablet by mouth every 6 (six) hours as needed for moderate pain.     lactulose 10 GM/15ML solution  Commonly known as:  CHRONULAC  Take 15 mLs (10 g total) by mouth 3 (three) times daily as needed for moderate constipation.     lisinopril-hydrochlorothiazide 20-12.5 MG per tablet  Commonly known as:  PRINZIDE,ZESTORETIC  Take 1 tablet by mouth daily.     LORazepam 0.5 MG tablet  Commonly known as:  ATIVAN  Take 0.5-1 mg by mouth at bedtime as needed for anxiety or sleep.     methocarbamol 500 MG tablet  Commonly known as:  ROBAXIN  Take 500 mg by mouth 4 (four) times daily. Exact dose?     NAPROXEN PO  Take 3 tablets by mouth daily as needed.     predniSONE 10 MG tablet  Commonly known as:  DELTASONE  5  tablets a day x 2 days, 4 tablets x 2 days, 3 tabs x 2 days, 2 tabs x 2 days, 1 tab x 2 days           Objective:   Physical Exam BP 118/68  Pulse 69  Temp(Src) 98.7 F (37.1 C) (Oral)  Wt 176 lb 2.4 oz (79.9 kg)  SpO2 97% General -- alert, well-developed, NAD but + Antalgic compression.   Abdomen-- Not distended, good bowel sounds,soft, non-tender.  Extremities-- no pretibial edema bilaterally  Neurologic--  alert & oriented X3. Speech normal, gait limited by pain , uses a cane;  strength decreased @ L leg distal>proximal (Apparently since back surgery more than a year ago) .DTRs lower extremity absent . Pinprick examination w/ decrease sensitivity to   distal lower L extremity   Psych-- Cognition and judgment appear intact. Cooperative with normal attention span and concentration. No anxious or depressed appearing.          Assessment & Plan:     F2F ~ 35 min

## 2014-05-13 NOTE — Assessment & Plan Note (Addendum)
Symptoms and findings consistent with a left radiculopathy (likely L5), sx started  05/10/2014. Chronic back pain seems at baseline. Plan:  Discontinue OxyContin due to to constipation, stick with hydrocodone Prednisone Likes to see a local back specialist, refer her to [facility]  Severe constipation and rectal bleeding (Likely from straining) --->  prescribe lactulose, d/c oxycontin, she will call if bleeding increases or now better. She had a normal colonoscopy 10-2013

## 2014-05-13 NOTE — Patient Instructions (Signed)
Stop Oxycodone Take hydrocodone as needed for  pain Take prednisone as prescribed Take lactulose as needed for constipation If the constipation is not improving or if you would  More severe- persistent bleeding ---> please call the office immediately.

## 2014-05-13 NOTE — Progress Notes (Signed)
Pre visit review using our clinic review tool, if applicable. No additional management support is needed unless otherwise documented below in the visit note. 

## 2014-05-22 ENCOUNTER — Other Ambulatory Visit: Payer: Self-pay | Admitting: Internal Medicine

## 2014-05-22 ENCOUNTER — Other Ambulatory Visit: Payer: Self-pay

## 2014-05-22 ENCOUNTER — Telehealth: Payer: Self-pay | Admitting: Internal Medicine

## 2014-05-22 DIAGNOSIS — M5440 Lumbago with sciatica, unspecified side: Secondary | ICD-10-CM

## 2014-05-22 MED ORDER — GABAPENTIN 800 MG PO TABS: 800.0000 mg | ORAL_TABLET | Freq: Three times a day (TID) | ORAL | Status: DC

## 2014-05-22 NOTE — Telephone Encounter (Signed)
Pt is needing new rx for gabapentin (NEURONTIN) 800 MG tablet, naproxen po, send to [facility] [address].

## 2014-05-22 NOTE — Telephone Encounter (Signed)
She liked to see a "back specialist" , after we discussed the issue I recommended orthopedic surgery   because she has a radiculopathy.  If she prefers to see neurology that is okay with me. New referral enter

## 2014-05-22 NOTE — Telephone Encounter (Signed)
Pt calling very upset/angry/cussing, states she was to be referred to a neurologist. Referral was placed for ortho. She is in terrible pain and request urgent referral to neurologist. Also states [facility] has faxed over order form multiple times and no response back. Request call back asap.

## 2014-05-22 NOTE — Telephone Encounter (Signed)
Please advise 

## 2014-05-22 NOTE — Telephone Encounter (Signed)
Pt aware.

## 2014-05-22 NOTE — Telephone Encounter (Signed)
Medication sent to Pharmacy. Naproxen can't be sent, she uses OTC.

## 2014-05-26 ENCOUNTER — Ambulatory Visit: Payer: Medicare Other | Attending: Internal Medicine | Admitting: Physical Therapy

## 2014-05-26 DIAGNOSIS — Z96649 Presence of unspecified artificial hip joint: Secondary | ICD-10-CM | POA: Diagnosis not present

## 2014-05-26 DIAGNOSIS — M545 Low back pain, unspecified: Secondary | ICD-10-CM | POA: Insufficient documentation

## 2014-05-26 DIAGNOSIS — Z96659 Presence of unspecified artificial knee joint: Secondary | ICD-10-CM | POA: Diagnosis not present

## 2014-05-26 DIAGNOSIS — M25559 Pain in unspecified hip: Secondary | ICD-10-CM | POA: Diagnosis not present

## 2014-05-26 DIAGNOSIS — Z96619 Presence of unspecified artificial shoulder joint: Secondary | ICD-10-CM | POA: Diagnosis not present

## 2014-05-26 DIAGNOSIS — IMO0001 Reserved for inherently not codable concepts without codable children: Secondary | ICD-10-CM | POA: Diagnosis not present

## 2014-06-01 ENCOUNTER — Ambulatory Visit: Payer: Medicare Other | Admitting: Physical Therapy

## 2014-06-01 DIAGNOSIS — IMO0001 Reserved for inherently not codable concepts without codable children: Secondary | ICD-10-CM | POA: Diagnosis not present

## 2014-06-02 DIAGNOSIS — M5137 Other intervertebral disc degeneration, lumbosacral region: Secondary | ICD-10-CM | POA: Diagnosis not present

## 2014-06-04 ENCOUNTER — Ambulatory Visit: Payer: Medicare Other | Admitting: Physical Therapy

## 2014-06-08 ENCOUNTER — Ambulatory Visit: Payer: Medicare Other | Admitting: Physical Therapy

## 2014-06-08 DIAGNOSIS — IMO0001 Reserved for inherently not codable concepts without codable children: Secondary | ICD-10-CM | POA: Diagnosis not present

## 2014-06-09 DIAGNOSIS — M47812 Spondylosis without myelopathy or radiculopathy, cervical region: Secondary | ICD-10-CM | POA: Diagnosis not present

## 2014-06-09 DIAGNOSIS — Z981 Arthrodesis status: Secondary | ICD-10-CM | POA: Diagnosis not present

## 2014-06-09 DIAGNOSIS — M48062 Spinal stenosis, lumbar region with neurogenic claudication: Secondary | ICD-10-CM | POA: Diagnosis not present

## 2014-06-09 DIAGNOSIS — M4120 Other idiopathic scoliosis, site unspecified: Secondary | ICD-10-CM | POA: Diagnosis not present

## 2014-06-09 DIAGNOSIS — Q762 Congenital spondylolisthesis: Secondary | ICD-10-CM | POA: Diagnosis not present

## 2014-06-09 DIAGNOSIS — IMO0002 Reserved for concepts with insufficient information to code with codable children: Secondary | ICD-10-CM | POA: Diagnosis not present

## 2014-06-11 ENCOUNTER — Ambulatory Visit: Payer: Medicare Other | Attending: Internal Medicine | Admitting: Physical Therapy

## 2014-06-11 DIAGNOSIS — M5440 Lumbago with sciatica, unspecified side: Secondary | ICD-10-CM | POA: Diagnosis not present

## 2014-06-11 DIAGNOSIS — Z9889 Other specified postprocedural states: Secondary | ICD-10-CM | POA: Diagnosis not present

## 2014-06-11 DIAGNOSIS — Z96641 Presence of right artificial hip joint: Secondary | ICD-10-CM | POA: Insufficient documentation

## 2014-06-11 DIAGNOSIS — M25551 Pain in right hip: Secondary | ICD-10-CM | POA: Insufficient documentation

## 2014-06-16 ENCOUNTER — Ambulatory Visit: Payer: Medicare Other | Admitting: Physical Therapy

## 2014-06-16 DIAGNOSIS — M5440 Lumbago with sciatica, unspecified side: Secondary | ICD-10-CM | POA: Diagnosis not present

## 2014-06-18 ENCOUNTER — Ambulatory Visit: Payer: Medicare Other | Admitting: Physical Therapy

## 2014-06-18 DIAGNOSIS — M5440 Lumbago with sciatica, unspecified side: Secondary | ICD-10-CM | POA: Diagnosis not present

## 2014-06-22 ENCOUNTER — Ambulatory Visit: Payer: Medicare Other | Admitting: Physical Therapy

## 2014-06-23 DIAGNOSIS — M4316 Spondylolisthesis, lumbar region: Secondary | ICD-10-CM | POA: Diagnosis not present

## 2014-06-23 DIAGNOSIS — M899 Disorder of bone, unspecified: Secondary | ICD-10-CM | POA: Diagnosis not present

## 2014-06-23 DIAGNOSIS — M4806 Spinal stenosis, lumbar region: Secondary | ICD-10-CM | POA: Diagnosis not present

## 2014-06-24 ENCOUNTER — Ambulatory Visit: Payer: Medicare Other | Admitting: Physical Therapy

## 2014-06-24 DIAGNOSIS — M5440 Lumbago with sciatica, unspecified side: Secondary | ICD-10-CM | POA: Diagnosis not present

## 2014-06-25 ENCOUNTER — Ambulatory Visit: Payer: Medicare Other | Admitting: Physical Therapy

## 2014-06-25 ENCOUNTER — Encounter: Payer: Self-pay | Admitting: *Deleted

## 2014-06-26 ENCOUNTER — Ambulatory Visit: Payer: Medicare Other | Admitting: Physical Therapy

## 2014-06-26 DIAGNOSIS — M5440 Lumbago with sciatica, unspecified side: Secondary | ICD-10-CM | POA: Diagnosis not present

## 2014-06-29 ENCOUNTER — Telehealth: Payer: Self-pay | Admitting: Neurology

## 2014-06-29 NOTE — Telephone Encounter (Signed)
Pt cancelled NP appt w/ Dr. Tomi Likens 07/03/14. She says she will call later to r/s. Referring office notified via [facility] referral message / Gayleen Orem.

## 2014-06-30 ENCOUNTER — Ambulatory Visit: Payer: Medicare Other | Admitting: Physical Therapy

## 2014-06-30 DIAGNOSIS — M5440 Lumbago with sciatica, unspecified side: Secondary | ICD-10-CM | POA: Diagnosis not present

## 2014-07-01 DIAGNOSIS — Z23 Encounter for immunization: Secondary | ICD-10-CM | POA: Diagnosis not present

## 2014-07-02 ENCOUNTER — Ambulatory Visit: Payer: Medicare Other | Admitting: Physical Therapy

## 2014-07-03 ENCOUNTER — Ambulatory Visit: Payer: Medicare Other | Admitting: Neurology

## 2014-07-07 ENCOUNTER — Ambulatory Visit: Payer: Medicare Other | Admitting: Physical Therapy

## 2014-07-07 DIAGNOSIS — M5440 Lumbago with sciatica, unspecified side: Secondary | ICD-10-CM | POA: Diagnosis not present

## 2014-07-09 ENCOUNTER — Ambulatory Visit: Payer: Medicare Other | Admitting: Physical Therapy

## 2014-07-09 ENCOUNTER — Telehealth: Payer: Self-pay | Admitting: Internal Medicine

## 2014-07-09 DIAGNOSIS — M5440 Lumbago with sciatica, unspecified side: Secondary | ICD-10-CM | POA: Diagnosis not present

## 2014-07-09 NOTE — Telephone Encounter (Signed)
done

## 2014-07-09 NOTE — Telephone Encounter (Signed)
Faxed to [facility]

## 2014-07-09 NOTE — Telephone Encounter (Signed)
Pt is requesting a refill on Lorazepam.  Last OV: 05/13/2014 Last Fill: 12/03/2013 # 60 3 RF UDS: 12/03/2013 Low risk   Please advise.

## 2014-07-13 ENCOUNTER — Telehealth: Payer: Self-pay | Admitting: Neurology

## 2014-07-13 NOTE — Telephone Encounter (Signed)
Pt resch appt to 09-01-14

## 2014-07-14 ENCOUNTER — Ambulatory Visit: Payer: Medicare Other | Attending: Internal Medicine | Admitting: Physical Therapy

## 2014-07-14 DIAGNOSIS — M25551 Pain in right hip: Secondary | ICD-10-CM | POA: Diagnosis not present

## 2014-07-14 DIAGNOSIS — Z9889 Other specified postprocedural states: Secondary | ICD-10-CM | POA: Diagnosis not present

## 2014-07-14 DIAGNOSIS — Z96641 Presence of right artificial hip joint: Secondary | ICD-10-CM | POA: Insufficient documentation

## 2014-07-14 DIAGNOSIS — M5440 Lumbago with sciatica, unspecified side: Secondary | ICD-10-CM | POA: Diagnosis not present

## 2014-07-15 DIAGNOSIS — M5136 Other intervertebral disc degeneration, lumbar region: Secondary | ICD-10-CM | POA: Diagnosis not present

## 2014-07-15 DIAGNOSIS — M5415 Radiculopathy, thoracolumbar region: Secondary | ICD-10-CM | POA: Diagnosis not present

## 2014-07-21 ENCOUNTER — Ambulatory Visit: Payer: Medicare Other | Admitting: Physical Therapy

## 2014-07-21 DIAGNOSIS — M5440 Lumbago with sciatica, unspecified side: Secondary | ICD-10-CM | POA: Diagnosis not present

## 2014-07-23 ENCOUNTER — Ambulatory Visit: Payer: Medicare Other | Admitting: Physical Therapy

## 2014-07-23 ENCOUNTER — Ambulatory Visit: Payer: Medicare Other | Admitting: Neurology

## 2014-07-23 DIAGNOSIS — M5440 Lumbago with sciatica, unspecified side: Secondary | ICD-10-CM | POA: Diagnosis not present

## 2014-07-30 DIAGNOSIS — M5136 Other intervertebral disc degeneration, lumbar region: Secondary | ICD-10-CM | POA: Diagnosis not present

## 2014-07-30 DIAGNOSIS — M961 Postlaminectomy syndrome, not elsewhere classified: Secondary | ICD-10-CM | POA: Diagnosis not present

## 2014-07-30 DIAGNOSIS — M5416 Radiculopathy, lumbar region: Secondary | ICD-10-CM | POA: Diagnosis not present

## 2014-08-09 ENCOUNTER — Other Ambulatory Visit: Payer: Self-pay | Admitting: Internal Medicine

## 2014-08-10 ENCOUNTER — Other Ambulatory Visit: Payer: Self-pay

## 2014-08-13 DIAGNOSIS — G894 Chronic pain syndrome: Secondary | ICD-10-CM | POA: Diagnosis not present

## 2014-08-13 DIAGNOSIS — M961 Postlaminectomy syndrome, not elsewhere classified: Secondary | ICD-10-CM | POA: Diagnosis not present

## 2014-08-13 DIAGNOSIS — M5416 Radiculopathy, lumbar region: Secondary | ICD-10-CM | POA: Diagnosis not present

## 2014-09-01 ENCOUNTER — Ambulatory Visit: Payer: Medicare Other | Admitting: Neurology

## 2014-09-01 ENCOUNTER — Telehealth: Payer: Self-pay | Admitting: Neurology

## 2014-09-01 NOTE — Telephone Encounter (Signed)
Pt called at 7:52 AM to cancel her NP appt w/ Dr. Tomi Likens. Pt will call later to r/s. Dr. Paz/referring provider was notified.

## 2014-09-01 NOTE — Telephone Encounter (Signed)
appt will be marked as a no show due to same day cancellation but we do not need to send a letter  [name] S.

## 2014-09-10 DIAGNOSIS — F4542 Pain disorder with related psychological factors: Secondary | ICD-10-CM | POA: Diagnosis not present

## 2014-09-25 DIAGNOSIS — M5416 Radiculopathy, lumbar region: Secondary | ICD-10-CM | POA: Diagnosis not present

## 2014-09-25 DIAGNOSIS — M5136 Other intervertebral disc degeneration, lumbar region: Secondary | ICD-10-CM | POA: Diagnosis not present

## 2014-09-25 DIAGNOSIS — M961 Postlaminectomy syndrome, not elsewhere classified: Secondary | ICD-10-CM | POA: Diagnosis not present

## 2014-10-16 DIAGNOSIS — G8929 Other chronic pain: Secondary | ICD-10-CM | POA: Diagnosis not present

## 2014-10-16 DIAGNOSIS — M961 Postlaminectomy syndrome, not elsewhere classified: Secondary | ICD-10-CM | POA: Diagnosis not present

## 2014-10-16 DIAGNOSIS — G894 Chronic pain syndrome: Secondary | ICD-10-CM | POA: Diagnosis not present

## 2014-10-16 DIAGNOSIS — M5416 Radiculopathy, lumbar region: Secondary | ICD-10-CM | POA: Diagnosis not present

## 2014-10-21 DIAGNOSIS — M961 Postlaminectomy syndrome, not elsewhere classified: Secondary | ICD-10-CM | POA: Diagnosis not present

## 2014-10-21 DIAGNOSIS — M5416 Radiculopathy, lumbar region: Secondary | ICD-10-CM | POA: Diagnosis not present

## 2015-01-19 DIAGNOSIS — M5136 Other intervertebral disc degeneration, lumbar region: Secondary | ICD-10-CM | POA: Diagnosis not present

## 2015-01-19 DIAGNOSIS — M5416 Radiculopathy, lumbar region: Secondary | ICD-10-CM | POA: Diagnosis not present

## 2015-01-21 ENCOUNTER — Encounter: Payer: Self-pay | Admitting: Medical

## 2015-01-21 ENCOUNTER — Ambulatory Visit (INDEPENDENT_AMBULATORY_CARE_PROVIDER_SITE_OTHER): Payer: Medicare Other | Admitting: Medical

## 2015-01-21 VITALS — BP 125/84 | HR 62 | Temp 99.0°F | Ht 65.0 in | Wt 168.2 lb

## 2015-01-21 DIAGNOSIS — J029 Acute pharyngitis, unspecified: Secondary | ICD-10-CM | POA: Insufficient documentation

## 2015-01-21 DIAGNOSIS — J019 Acute sinusitis, unspecified: Secondary | ICD-10-CM | POA: Insufficient documentation

## 2015-01-21 DIAGNOSIS — J0100 Acute maxillary sinusitis, unspecified: Secondary | ICD-10-CM

## 2015-01-21 DIAGNOSIS — J301 Allergic rhinitis due to pollen: Secondary | ICD-10-CM

## 2015-01-21 DIAGNOSIS — J309 Allergic rhinitis, unspecified: Secondary | ICD-10-CM | POA: Insufficient documentation

## 2015-01-21 LAB — POCT RAPID STREP A (OFFICE): Rapid Strep A Screen: NEGATIVE

## 2015-01-21 MED ORDER — AZITHROMYCIN 250 MG PO TABS: ORAL_TABLET | ORAL | Status: DC

## 2015-01-21 NOTE — Assessment & Plan Note (Signed)
Advised get flonase otc. Mild preceding symptoms before maxillary sinus pressure/possible sinus infection.

## 2015-01-21 NOTE — Progress Notes (Signed)
Subjective:    Patient ID: [name], female    DOB: [birth year], 74 y.o.   MRN: [id number]  HPI  Pt in with sinus pressure yesterday afternoon  and st this am.t. Pt states st severe this morning. mild slight allergies with runny nose and pnd last week. Pt states last dental procedure about 1 months ago needed antibiotic after sinus infection following dental procedure.    Review of Systems  Constitutional: Negative for fever, chills and fatigue.  HENT: Positive for congestion, postnasal drip, sinus pressure and sore throat.   Respiratory: Negative for apnea, cough, choking, shortness of breath and wheezing.   Cardiovascular: Negative for chest pain and palpitations.  Musculoskeletal: Negative for back pain.  Neurological: Negative for dizziness, seizures, weakness, light-headedness, numbness and headaches.  Psychiatric/Behavioral: Negative for behavioral problems and confusion.    Past Medical History  Diagnosis Date  . History of elevated homocysteine   . Thyroid nodule     rt benign colloid nodule--left hyperplastic nodule- Thyroid bx 2006: neg  . Baker's cyst   . Goiter   . Adenomatous colon polyp 03/2003  . PONV (postoperative nausea and vomiting)   . Anxiety     ativan for sleep  . OA (osteoarthritis)      knee pain after TKR left , back pain- last steroid injection back 2 months ago Dr Nelva Bush  . Hypertension 09/11/2005    History   Social History  . Marital Status: Married    Spouse Name: N/A  . Number of Children: 2  . Years of Education: N/A   Occupational History  . Retired Pharmacist, hospital     retired  .     Social History Main Topics  . Smoking status: Former Smoker    Quit date: 09/12/1983  . Smokeless tobacco: Never Used  . Alcohol Use: No  . Drug Use: No  . Sexual Activity: Not on file   Other Topics Concern  . Not on file   Social History Narrative   Lives w/ husband of 75 years             Past Surgical History  Procedure Laterality Date    . Abdominal hysterectomy    . Oophorectomy    . Knee arthroscopy      L   replacement 2007, Dr Corinne Ports, then a manipulation, then redo TKR 11/09 w/ Dr.Alusio. Parcial replacement again 09-2010  . Total hip arthroplasty  3/11    Right   . Facelift    . Incisional hernia repair    . Knee surgery      x 5-  left  . Shoulder surgery      replacements: L 2010, R 2012  . Bakers cystectomy      right knee  . Total knee arthroplasty  05/20/2012    Procedure: TOTAL KNEE ARTHROPLASTY;  Surgeon: Gearlean Alf, MD;  Location: [facility];  Service: Orthopedics;  Laterality: Right;  . Back surgery  11-09-2011    at [facility], lumbar    Family History  Problem Relation Age of Onset  . Breast cancer Neg Hx   . Colon cancer Neg Hx   . Pancreatic cancer Neg Hx   . Stomach cancer Neg Hx   . Coronary artery disease Father     CABG at 43  . Prostate cancer Father   . Colon polyps Father   . Coronary artery disease Brother   . Colon polyps Brother     Allergies  Allergen  Reactions  . Tramadol Hives and Swelling  . Tramadol Hcl Hives    REACTION: swelling    Current Outpatient Prescriptions on File Prior to Visit  Medication Sig Dispense Refill  . aspirin 81 MG tablet Take 81 mg by mouth daily.    . Calcium Carbonate-Vitamin D (CALCIUM + D PO) Take 600 mg by mouth daily after breakfast.    . gabapentin (NEURONTIN) 800 MG tablet TAKE 1 TABLET BY MOUTH THREE TIMES DAILY 270 tablet 4  . HYDROcodone-acetaminophen (NORCO/VICODIN) 5-325 MG per tablet Take 1 tablet by mouth every 6 (six) hours as needed for moderate pain.    . hydrocortisone 2.5 % cream     . lisinopril-hydrochlorothiazide (PRINZIDE,ZESTORETIC) 20-12.5 MG per tablet Take 1 tablet by mouth daily.    Marland Kitchen LORazepam (ATIVAN) 0.5 MG tablet Take 1-2 tablets (0.5-1 mg total) by mouth at bedtime as needed for sleep. 60 tablet 2  . naproxen (NAPROSYN) 500 MG tablet TAKE 1 TABLET BY MOUTH TWICE DAILY WITH MEALS 60 tablet 1   No current  facility-administered medications on file prior to visit.    BP 125/84 mmHg  Pulse 62  Temp(Src) 99 F (37.2 C) (Oral)  Ht 5\' 5"  (1.651 m)  Wt 168 lb 3.2 oz (76.295 kg)  BMI 27.99 kg/m2  SpO2 99%       Objective:   Physical Exam  General  Mental Status - Alert. General Appearance - Well groomed. Not in acute distress.  Skin Rashes- No Rashes.  HEENT Head- Normal. Ear Auditory Canal - Left- Normal. Right - Normal.Tympanic Membrane- Left- Normal. Right- Normal. Eye Sclera/Conjunctiva- Left- Normal. Right- Normal. Nose & Sinuses Nasal Mucosa- Left-  Boggy and Congested. Right-  Boggy and  Congested.Bilateral maxillary sius pressure but no  frontal sinus pressure. Mouth & Throat Lips: Upper Lip- Normal: no dryness, cracking, pallor, cyanosis, or vesicular eruption. Lower Lip-Normal: no dryness, cracking, pallor, cyanosis or vesicular eruption. Buccal Mucosa- Bilateral- No Aphthous ulcers. Oropharynx- No Discharge or Erythema. +PND. Tonsils: Characteristics- Bilateral- Erythema +  Congestion. Size/Enlargement- Bilateral-  1+ enlargement. Discharge- bilateral-None.  Neck Neck- Supple. No Masses. Mild submandibular node enlarged  Chest and Lung Exam Auscultation: Breath Sounds:-Clear even and unlabored.  Cardiovascular Auscultation:Rythm- Regular, rate and rhythm. Murmurs & Other Heart Sounds:Ausculatation of the heart reveal- No Murmurs.  Lymphatic Head & Neck General Head & Neck Lymphatics: Bilateral: Description- No Localized lymphadenopathy.       Assessment & Plan:

## 2015-01-21 NOTE — Assessment & Plan Note (Signed)
Rx azithromycin 

## 2015-01-21 NOTE — Patient Instructions (Addendum)
For your nasal congestion. Would recommend flonase nasal spray otc.  For your likely sinus infection and for possible throat infection despite negative strep test, I am going to go ahead and azithromycin.  Follow up in 7 days any pesristing symptoms or as needed any worsening symptoms.

## 2015-01-21 NOTE — Assessment & Plan Note (Signed)
Rapid strep neg but appearance of throat such that could be false negative and did rx azithromycin today.

## 2015-01-21 NOTE — Progress Notes (Signed)
Pre visit review using our clinic review tool, if applicable. No additional management support is needed unless otherwise documented below in the visit note. 

## 2015-02-17 ENCOUNTER — Other Ambulatory Visit: Payer: Self-pay | Admitting: Internal Medicine

## 2015-02-17 DIAGNOSIS — Z23 Encounter for immunization: Secondary | ICD-10-CM | POA: Diagnosis not present

## 2015-03-03 ENCOUNTER — Other Ambulatory Visit: Payer: Self-pay

## 2015-03-03 ENCOUNTER — Other Ambulatory Visit: Payer: Self-pay | Admitting: Internal Medicine

## 2015-03-03 NOTE — Telephone Encounter (Signed)
Needs office visit, not seen in more than 6 months, okay to send #20 until she is seen

## 2015-03-03 NOTE — Telephone Encounter (Signed)
Rx printed, awaiting MD signature.  

## 2015-03-03 NOTE — Telephone Encounter (Signed)
Pt is requesting refill on Lorazepam.  Last OV: 05/13/2014 Last Fill: 07/09/2014 #60 2RF UDS: 12/03/2013 Low risk  Please advise.

## 2015-03-03 NOTE — Telephone Encounter (Signed)
Rx faxed to [facility] pharmacy.  

## 2015-03-16 ENCOUNTER — Ambulatory Visit (HOSPITAL_BASED_OUTPATIENT_CLINIC_OR_DEPARTMENT_OTHER)
Admission: RE | Admit: 2015-03-16 | Discharge: 2015-03-16 | Disposition: A | Discharge status: Home / Self Care | Payer: Medicare Other | Source: Ambulatory Visit | Attending: Internal Medicine | Admitting: Internal Medicine

## 2015-03-16 ENCOUNTER — Other Ambulatory Visit: Payer: Self-pay | Admitting: Internal Medicine

## 2015-03-16 ENCOUNTER — Encounter: Payer: Self-pay | Admitting: Internal Medicine

## 2015-03-16 ENCOUNTER — Ambulatory Visit (INDEPENDENT_AMBULATORY_CARE_PROVIDER_SITE_OTHER): Payer: Medicare Other | Admitting: Internal Medicine

## 2015-03-16 VITALS — BP 124/64 | HR 60 | Temp 98.1°F | Ht 65.0 in | Wt 170.4 lb

## 2015-03-16 DIAGNOSIS — M150 Primary generalized (osteo)arthritis: Secondary | ICD-10-CM | POA: Diagnosis not present

## 2015-03-16 DIAGNOSIS — M1990 Unspecified osteoarthritis, unspecified site: Secondary | ICD-10-CM

## 2015-03-16 DIAGNOSIS — M1612 Unilateral primary osteoarthritis, left hip: Secondary | ICD-10-CM | POA: Insufficient documentation

## 2015-03-16 DIAGNOSIS — M159 Polyosteoarthritis, unspecified: Secondary | ICD-10-CM

## 2015-03-16 DIAGNOSIS — I10 Essential (primary) hypertension: Secondary | ICD-10-CM | POA: Diagnosis not present

## 2015-03-16 DIAGNOSIS — R202 Paresthesia of skin: Secondary | ICD-10-CM | POA: Diagnosis present

## 2015-03-16 DIAGNOSIS — M8949 Other hypertrophic osteoarthropathy, multiple sites: Secondary | ICD-10-CM

## 2015-03-16 LAB — CBC WITH DIFFERENTIAL/PLATELET
Basophils Absolute: 0 10*3/uL (ref 0.0–0.1)
Basophils Relative: 0.4 % (ref 0.0–3.0)
Eosinophils Absolute: 0.3 10*3/uL (ref 0.0–0.7)
Eosinophils Relative: 3.5 % (ref 0.0–5.0)
HCT: 39.4 % (ref 36.0–46.0)
Hemoglobin: 13.2 g/dL (ref 12.0–15.0)
Lymphocytes Relative: 31.2 % (ref 12.0–46.0)
Lymphs Abs: 2.6 10*3/uL (ref 0.7–4.0)
MCHC: 33.4 g/dL (ref 30.0–36.0)
MCV: 88.4 fl (ref 78.0–100.0)
Monocytes Absolute: 0.6 10*3/uL (ref 0.1–1.0)
Monocytes Relative: 7.6 % (ref 3.0–12.0)
Neutro Abs: 4.8 10*3/uL (ref 1.4–7.7)
Neutrophils Relative %: 57.3 % (ref 43.0–77.0)
Platelets: 326 10*3/uL (ref 150.0–400.0)
RBC: 4.45 Mil/uL (ref 3.87–5.11)
RDW: 13.9 % (ref 11.5–15.5)
WBC: 8.4 10*3/uL (ref 4.0–10.5)

## 2015-03-16 LAB — COMPREHENSIVE METABOLIC PANEL
ALT: 12 U/L (ref 0–35)
AST: 25 U/L (ref 0–37)
Albumin: 4.3 g/dL (ref 3.5–5.2)
Alkaline Phosphatase: 82 U/L (ref 39–117)
BUN: 17 mg/dL (ref 6–23)
CO2: 28 mEq/L (ref 19–32)
Calcium: 9.8 mg/dL (ref 8.4–10.5)
Chloride: 100 mEq/L (ref 96–112)
Creatinine, Ser: 0.91 mg/dL (ref 0.40–1.20)
GFR: 64.18 mL/min (ref >60.00)
Glucose, Bld: 91 mg/dL (ref 70–99)
Potassium: 4.3 mEq/L (ref 3.5–5.1)
Sodium: 137 mEq/L (ref 135–145)
Total Bilirubin: 0.5 mg/dL (ref 0.2–1.2)
Total Protein: 7 g/dL (ref 6.0–8.3)

## 2015-03-16 LAB — SEDIMENTATION RATE: Sed Rate: 17 mm/hr (ref 0–22)

## 2015-03-16 MED ORDER — LISINOPRIL-HYDROCHLOROTHIAZIDE 20-12.5 MG PO TABS: 1.0000 | ORAL_TABLET | Freq: Every day | ORAL | Status: DC

## 2015-03-16 MED ORDER — GABAPENTIN 800 MG PO TABS: 800.0000 mg | ORAL_TABLET | Freq: Four times a day (QID) | ORAL | Status: DC

## 2015-03-16 NOTE — Progress Notes (Signed)
Pre visit review using our clinic review tool, if applicable. No additional management support is needed unless otherwise documented below in the visit note. 

## 2015-03-16 NOTE — Patient Instructions (Signed)
Get your blood work before you leave   Stop by the first floor and get the XR   Increase gabapentin to 4 times a day

## 2015-03-16 NOTE — Assessment & Plan Note (Signed)
Well controlled, labs  

## 2015-03-16 NOTE — Progress Notes (Signed)
Subjective:    Patient ID: [name], female    DOB: 1941/02/05, 74 y.o.   MRN: [id number]  DOS:  03/16/2015 Type of visit - description : Acute visit Interval history: Continue with back pain since the last time I saw her 05-2014. the left leg numbness and toe numbness are getting worse described as a sharp burning pain throughout the leg. She is also developing same symptoms on the right leg. Symptoms decrease when she lays flat on her back with her legs bended Symptoms increased by walking. Denies any recent injuries or falls. Notes from [facility] in [place] orthopedics reviewed. HTN, good compliance of medication, reports ambulatory BPs normal   Review of Systems No fever chills No bladder or bowel incontinence No headaches or weight loss No myalgias per se. She has developed knee pain bilaterally for the last 3 weeks. No shoulder pain at all, minor aches at the wrists and hands without redness or swelling  Past Medical History  Diagnosis Date  . History of elevated homocysteine   . Thyroid nodule     rt benign colloid nodule--left hyperplastic nodule- Thyroid bx 2006: neg  . Baker's cyst   . Goiter   . Adenomatous colon polyp 03/2003  . PONV (postoperative nausea and vomiting)   . Anxiety     ativan for sleep  . OA (osteoarthritis)      knee pain after TKR left , back pain- last steroid injection back 2 months ago Dr Nelva Bush  . Hypertension 09/11/2005    Past Surgical History  Procedure Laterality Date  . Abdominal hysterectomy    . Oophorectomy    . Knee arthroscopy      L   replacement 2007, Dr Corinne Ports, then a manipulation, then redo TKR 11/09 w/ Dr.Alusio. Parcial replacement again 09-2010  . Total hip arthroplasty  3/11    Right   . Facelift    . Incisional hernia repair    . Knee surgery      x 5-  left  . Shoulder surgery      replacements: L 2010, R 2012  . Bakers cystectomy      right knee  . Total knee arthroplasty  05/20/2012    Procedure:  TOTAL KNEE ARTHROPLASTY;  Surgeon: Gearlean Alf, MD;  Location: [facility];  Service: Orthopedics;  Laterality: Right;  . Back surgery  11-09-2011    at [facility], lumbar    History   Social History  . Marital Status: Married    Spouse Name: N/A  . Number of Children: 2  . Years of Education: N/A   Occupational History  . Retired Pharmacist, hospital     retired  .     Social History Main Topics  . Smoking status: Former Smoker    Quit date: 09/12/1983  . Smokeless tobacco: Never Used  . Alcohol Use: No  . Drug Use: No  . Sexual Activity: Not on file   Other Topics Concern  . Not on file   Social History Narrative   Lives w/ husband of 42 years                 Medication List       This list is accurate as of: 03/16/15  1:12 PM.  Always use your most recent med list.               aspirin 81 MG tablet  Take 81 mg by mouth daily.     CALCIUM +  D PO  Take 600 mg by mouth daily after breakfast.     gabapentin 800 MG tablet  Commonly known as:  NEURONTIN  Take 1 tablet (800 mg total) by mouth 4 (four) times daily.     HYDROcodone-acetaminophen 5-325 MG per tablet  Commonly known as:  NORCO/VICODIN  Take 1 tablet by mouth every 6 (six) hours as needed for moderate pain.     hydrocortisone 2.5 % cream     lisinopril-hydrochlorothiazide 20-12.5 MG per tablet  Commonly known as:  PRINZIDE,ZESTORETIC  Take 1 tablet by mouth daily.     LORazepam 0.5 MG tablet  Commonly known as:  ATIVAN  Take 1-2 tablets (0.5-1 mg total) by mouth at bedtime as needed for sleep.     TYLENOL PO  Take by mouth as needed.           Objective:   Physical Exam BP 124/64 mmHg  Pulse 60  Temp(Src) 98.1 F (36.7 C) (Oral)  Ht 5\' 5"  (1.651 m)  Wt 170 lb 6 oz (77.282 kg)  BMI 28.35 kg/m2  SpO2 97% General:   Well developed, well nourished. + Distress physical a with walking and emotionally when she talks about pain.  HEENT:  Normocephalic . Face symmetric, atraumatic Lungs:  CTA  B Normal respiratory effort, no intercostal retractions, no accessory muscle use. Heart: RRR,  no murmur.  No pretibial edema bilaterally  MSK: Both knees s/p replacements, range of motion seems okay, some warmness without redness anteriorly. Right hip rotation normal. Left hip rotation very painful. Hands and wrists: No synovitis. Skin: Not pale. Not jaundice Neurologic:  alert & oriented X3.  Speech normal, gait  Unassisted, limited by pain.  Psych--  Cognition and judgment appear intact.  Cooperative with normal attention span and concentration.  Behavior appropriate. No anxious or depressed appearing.        Assessment & Plan:

## 2015-03-16 NOTE — Assessment & Plan Note (Addendum)
Patient presents with the following pains: Chronic back pain Chronic L>R leg numbness and burning  Acute (3 weeks) bilateral knee pain with some warmness, status post remote bilateral TKR. Since the last time I saw her last year, she was seen by a [facility], and [facility]. She was diagnosed with postlaminectomy syndrome, spinal stenosis. She received a local back injection preparation: No help They were talking about the cord stimulator but she doesn't think that is going to work. Currently on hydrocodone and gabapentin 800 mg 3 times a day. I am somewhat concerned about the bilateral knee pain and warmness for 3 weeks. Plan:  Increase gabapentin to 4 times a day which is the top dose Hydrocodone as needed, she did take Oxycodone yesterday (a leftover) because the pain was severe. Sed rate, total CK, hip x-ray Recommend to see ortho for knee pain Likes to get a second opinion for pain management, currently see Dr. Nelva Bush, arrange a new referral

## 2015-03-17 LAB — CK TOTAL AND CKMB (NOT AT ARMC): Total CK: 79 U/L (ref 7–177)

## 2015-03-22 ENCOUNTER — Telehealth: Payer: Self-pay | Admitting: Internal Medicine

## 2015-03-22 DIAGNOSIS — Z471 Aftercare following joint replacement surgery: Secondary | ICD-10-CM | POA: Diagnosis not present

## 2015-03-22 DIAGNOSIS — M25562 Pain in left knee: Secondary | ICD-10-CM | POA: Diagnosis not present

## 2015-03-22 DIAGNOSIS — M7072 Other bursitis of hip, left hip: Secondary | ICD-10-CM | POA: Diagnosis not present

## 2015-03-22 DIAGNOSIS — Z96652 Presence of left artificial knee joint: Secondary | ICD-10-CM | POA: Diagnosis not present

## 2015-03-22 NOTE — Telephone Encounter (Signed)
Tried calling Pt, no voicemail set up or answer. Will try contacting Pt again later.

## 2015-03-22 NOTE — Telephone Encounter (Signed)
Pt called in upset that she hasn't received results of xray. I told her a letter was mailed. She was not happy and said that she felt dropped like a hot potato. Pt had questions about who referral was sent to and she wanted a name. Transferred to [facility] for referral info. Pt would like call about xray results.

## 2015-03-23 NOTE — Telephone Encounter (Signed)
Tried calling Pt, again no answer.

## 2015-04-22 DIAGNOSIS — M5136 Other intervertebral disc degeneration, lumbar region: Secondary | ICD-10-CM | POA: Diagnosis not present

## 2015-04-22 DIAGNOSIS — M5416 Radiculopathy, lumbar region: Secondary | ICD-10-CM | POA: Diagnosis not present

## 2015-04-22 DIAGNOSIS — M961 Postlaminectomy syndrome, not elsewhere classified: Secondary | ICD-10-CM | POA: Diagnosis not present

## 2015-05-03 DIAGNOSIS — R5382 Chronic fatigue, unspecified: Secondary | ICD-10-CM | POA: Diagnosis not present

## 2015-05-03 DIAGNOSIS — I10 Essential (primary) hypertension: Secondary | ICD-10-CM | POA: Diagnosis not present

## 2015-05-03 DIAGNOSIS — M1990 Unspecified osteoarthritis, unspecified site: Secondary | ICD-10-CM | POA: Diagnosis not present

## 2015-05-03 DIAGNOSIS — E119 Type 2 diabetes mellitus without complications: Secondary | ICD-10-CM | POA: Diagnosis not present

## 2015-05-03 DIAGNOSIS — E669 Obesity, unspecified: Secondary | ICD-10-CM | POA: Diagnosis not present

## 2015-05-03 DIAGNOSIS — E785 Hyperlipidemia, unspecified: Secondary | ICD-10-CM | POA: Diagnosis not present

## 2015-05-04 ENCOUNTER — Other Ambulatory Visit: Payer: Self-pay

## 2015-05-04 DIAGNOSIS — Z1231 Encounter for screening mammogram for malignant neoplasm of breast: Secondary | ICD-10-CM

## 2015-05-11 ENCOUNTER — Ambulatory Visit
Admission: RE | Admit: 2015-05-11 | Discharge: 2015-05-11 | Disposition: A | Discharge status: Home / Self Care | Payer: Medicare Other | Source: Ambulatory Visit

## 2015-05-11 DIAGNOSIS — Z1231 Encounter for screening mammogram for malignant neoplasm of breast: Secondary | ICD-10-CM

## 2015-06-03 ENCOUNTER — Encounter: Payer: Self-pay | Admitting: Gastroenterology

## 2015-06-15 ENCOUNTER — Encounter: Payer: Self-pay | Admitting: Neurology

## 2015-06-15 ENCOUNTER — Ambulatory Visit (INDEPENDENT_AMBULATORY_CARE_PROVIDER_SITE_OTHER): Payer: Medicare Other | Admitting: Neurology

## 2015-06-15 VITALS — BP 118/78 | HR 53 | Ht 65.0 in | Wt 173.0 lb

## 2015-06-15 DIAGNOSIS — M4806 Spinal stenosis, lumbar region: Secondary | ICD-10-CM

## 2015-06-15 DIAGNOSIS — M5416 Radiculopathy, lumbar region: Secondary | ICD-10-CM | POA: Diagnosis not present

## 2015-06-15 DIAGNOSIS — M48061 Spinal stenosis, lumbar region without neurogenic claudication: Secondary | ICD-10-CM

## 2015-06-15 MED ORDER — PREGABALIN 75 MG PO CAPS: 75.0000 mg | ORAL_CAPSULE | Freq: Two times a day (BID) | ORAL | Status: DC

## 2015-06-15 NOTE — Addendum Note (Signed)
Addended by: Chester Holstein on: 06/15/2015 01:26 PM   Modules accepted: Orders

## 2015-06-15 NOTE — Patient Instructions (Signed)
1.  Start Lyrica 75mg  twice daily.  Call in 4 weeks with update 2.  Continue gabapentin for now 3.  Follow up in 6 months.

## 2015-06-15 NOTE — Progress Notes (Signed)
NEUROLOGY CONSULTATION NOTE  [name] MRN: [id number] DOB: 1941-07-21  Referring provider: Dr. Larose Kells Primary care provider: Dr. Larose Kells  Reason for consult:  Lumbar radicular pain  HISTORY OF PRESENT ILLNESS: [name] is a 74 year old right-handed woman with OA, hypertension, goiter, benign thyroid nodules and back pain status post surgery who presents for back pain and left sciatica.  History obtained by patient and orthopedic notes from [place].  Reports of lumbar MRI reviewed.  She has significant history of lumbar stenosis and has undergone surgeries.  Her last fusion from T12 through L5, was performed in February 2014.    Pain was exacerbated in August 2015.  She reports burning pain from the left lower back, to the hip and the anterior/posterior thigh and anterior lower leg.  She also notes numbness on the side of her thigh.  She feels her leg is a little weak.  Her lower leg is painful to the touch.  XR of the left him showed mild degerative joint space narrowing.  L5 radiculopathy was suspected.  MRI of the lumbar spine with and without contrast performed at [facility] showed post spine fusion changes from T12 through L5, as well as 3 mm anterolisthesis of the L4 over L5 and osteophyte ridge, causing mild spinal canal stenosis and mild to moderate narrowing of the neural foramina abutting the left exiting nerve root.  She underwent PT as well as left L5 foraminal epidural injections.  She takes gabapentin 800mg  four times daily, which she doesn't think helps much.  She also takes hydrocodone for severe pain.  She also has history of shoulder surgery, bilateral knee surgery and right hip surgery. NOTE:  In the past, tramadol reportedly caused hives.  PAST MEDICAL HISTORY: Past Medical History  Diagnosis Date  . History of elevated homocysteine   . Thyroid nodule     rt benign colloid nodule--left hyperplastic nodule- Thyroid bx 2006: neg  . Baker's cyst   . Goiter   . Adenomatous  colon polyp 03/2003  . PONV (postoperative nausea and vomiting)   . Anxiety     ativan for sleep  . OA (osteoarthritis)      knee pain after TKR left , back pain- last steroid injection back 2 months ago Dr Nelva Bush  . Hypertension 09/11/2005  . Postlaminectomy syndrome, lumbar region     Dr. Nelva Bush, recommended doing bilateral S1 transforaminal epidural with D5W  . Lumbar radiculopathy     Dr. Nelva Bush  . Degenerative lumbar disc     Dr. Nelva Bush    PAST SURGICAL HISTORY: Past Surgical History  Procedure Laterality Date  . Abdominal hysterectomy    . Oophorectomy    . Knee arthroscopy      L   replacement 2007, Dr Corinne Ports, then a manipulation, then redo TKR 11/09 w/ Dr.Alusio. Parcial replacement again 09-2010  . Total hip arthroplasty  3/11    Right   . Facelift    . Incisional hernia repair    . Knee surgery      x 5-  left  . Shoulder surgery      replacements: L 2010, R 2012  . Bakers cystectomy      right knee  . Total knee arthroplasty  05/20/2012    Procedure: TOTAL KNEE ARTHROPLASTY;  Surgeon: Gearlean Alf, MD;  Location: [facility];  Service: Orthopedics;  Laterality: Right;  . Back surgery  11-09-2011    at [facility], lumbar    MEDICATIONS: Current Outpatient Prescriptions  on File Prior to Visit  Medication Sig Dispense Refill  . Acetaminophen (TYLENOL PO) Take by mouth as needed.    Marland Kitchen aspirin 81 MG tablet Take 81 mg by mouth daily.    . Calcium Carbonate-Vitamin D (CALCIUM + D PO) Take 600 mg by mouth daily after breakfast.    . gabapentin (NEURONTIN) 800 MG tablet Take 1 tablet (800 mg total) by mouth 4 (four) times daily. 360 tablet 1  . HYDROcodone-acetaminophen (NORCO/VICODIN) 5-325 MG per tablet Take 1 tablet by mouth every 6 (six) hours as needed for moderate pain.    . hydrocortisone 2.5 % cream     . lisinopril-hydrochlorothiazide (PRINZIDE,ZESTORETIC) 20-12.5 MG per tablet Take 1 tablet by mouth daily. 90 tablet 1  . LORazepam (ATIVAN) 0.5 MG tablet Take 1-2 tablets  (0.5-1 mg total) by mouth at bedtime as needed for sleep. 20 tablet 0   No current facility-administered medications on file prior to visit.    ALLERGIES: Allergies  Allergen Reactions  . Oxycodone-Acetaminophen Other (See Comments) and Nausea Only  . Tramadol Hcl Hives    REACTION: swelling    FAMILY HISTORY: Family History  Problem Relation Age of Onset  . Breast cancer Neg Hx   . Colon cancer Neg Hx   . Pancreatic cancer Neg Hx   . Stomach cancer Neg Hx   . Coronary artery disease Father     CABG at 71  . Prostate cancer Father   . Colon polyps Father   . Coronary artery disease Brother   . Colon polyps Brother     SOCIAL HISTORY: Social History   Social History  . Marital Status: Married    Spouse Name: N/A  . Number of Children: 2  . Years of Education: N/A   Occupational History  . Retired Pharmacist, hospital     retired  .     Social History Main Topics  . Smoking status: Former Smoker    Quit date: 09/12/1983  . Smokeless tobacco: Never Used  . Alcohol Use: No  . Drug Use: No  . Sexual Activity: Not on file   Other Topics Concern  . Not on file   Social History Narrative   Lives w/ husband of 52 years in a 3 story home. Has 2 sons.  Retired Education officer, museum.  Education: college.             REVIEW OF SYSTEMS: Constitutional: No fevers, chills, or sweats, no generalized fatigue, change in appetite Eyes: No visual changes, double vision, eye pain Ear, nose and throat: No hearing loss, ear pain, nasal congestion, sore throat Cardiovascular: No chest pain, palpitations Respiratory:  No shortness of breath at rest or with exertion, wheezes GastrointestinaI: No nausea, vomiting, diarrhea, abdominal pain, fecal incontinence Genitourinary:  No dysuria, urinary retention or frequency Musculoskeletal:  Back pain Integumentary: No rash, pruritus, skin lesions Neurological: as above Psychiatric: No depression, insomnia, anxiety Endocrine: No palpitations,  fatigue, diaphoresis, mood swings, change in appetite, change in weight, increased thirst Hematologic/Lymphatic:  No anemia, purpura, petechiae. Allergic/Immunologic: no itchy/runny eyes, nasal congestion, recent allergic reactions, rashes  PHYSICAL EXAM: Filed Vitals:   06/15/15 1011  BP: 118/78  Pulse: 53   General: No acute distress.  Patient appears well-groomed.  Head:  Normocephalic/atraumatic Eyes:  fundi unremarkable, without vessel changes, exudates, hemorrhages or papilledema. Neck: supple, no paraspinal tenderness, full range of motion Back: left sided lumbar paraspinal tenderness Heart: regular rate and rhythm Lungs: Clear to auscultation bilaterally. Vascular: No carotid bruits.  Neurological Exam: Mental status: alert and oriented to person, place, and time, recent and remote memory intact, fund of knowledge intact, attention and concentration intact, speech fluent and not dysarthric, language intact. Cranial nerves: CN I: not tested CN II: pupils equal, round and reactive to light, visual fields intact, fundi unremarkable, without vessel changes, exudates, hemorrhages or papilledema. CN III, IV, VI:  full range of motion, no nystagmus, no ptosis CN V: facial sensation intact CN VII: upper and lower face symmetric CN VIII: hearing intact CN IX, X: gag intact, uvula midline CN XI: sternocleidomastoid and trapezius muscles intact CN XII: tongue midline Bulk & Tone: normal, no fasciculations. Motor:  5-/5 left hip flexion, and both quadriceps.  Otherwise, 5/5 Sensation:  Decreased pinprick and vibration sensation of entire left lower extremity. Deep Tendon Reflexes:  Absent throughout, toes downgoing.  Finger to nose testing:  Without dysmetria.  Heel to shin:  Without dysmetria.  Gait:  Antalgic gait.  Difficulty tandem walking.  Romberg negative  IMPRESSION: Left sided lumbar polyradiculopathy.  She has had surgery involving multiple levels of her lumbar  spine.  PLAN: 1.  Will try Lyrica 75mg  twice daily.  Samples provided. 2.  Will continue gabapentin for now, with intention to taper off later if pain is controlled on Lyrica 3.  Follow up in 6 weeks  Thank you for allowing me to take part in the care of this patient.  Metta Clines, DO  CC:  Kathlene November, MD

## 2015-06-18 ENCOUNTER — Ambulatory Visit: Payer: BLUE CROSS/BLUE SHIELD | Admitting: Internal Medicine

## 2015-06-22 ENCOUNTER — Encounter: Payer: Self-pay | Admitting: Physical Medicine & Rehabilitation

## 2015-07-07 DIAGNOSIS — Z23 Encounter for immunization: Secondary | ICD-10-CM | POA: Diagnosis not present

## 2015-07-12 ENCOUNTER — Ambulatory Visit (HOSPITAL_BASED_OUTPATIENT_CLINIC_OR_DEPARTMENT_OTHER): Payer: Medicare Other | Admitting: Physical Medicine & Rehabilitation

## 2015-07-12 ENCOUNTER — Encounter: Payer: Medicare Other | Attending: Physical Medicine & Rehabilitation

## 2015-07-12 ENCOUNTER — Encounter: Payer: Self-pay | Admitting: Physical Medicine & Rehabilitation

## 2015-07-12 ENCOUNTER — Other Ambulatory Visit: Payer: Self-pay | Admitting: Physical Medicine & Rehabilitation

## 2015-07-12 VITALS — BP 150/81 | HR 61 | Resp 16

## 2015-07-12 DIAGNOSIS — M25552 Pain in left hip: Secondary | ICD-10-CM | POA: Diagnosis not present

## 2015-07-12 DIAGNOSIS — F419 Anxiety disorder, unspecified: Secondary | ICD-10-CM | POA: Diagnosis not present

## 2015-07-12 DIAGNOSIS — M1990 Unspecified osteoarthritis, unspecified site: Secondary | ICD-10-CM | POA: Diagnosis not present

## 2015-07-12 DIAGNOSIS — M5416 Radiculopathy, lumbar region: Secondary | ICD-10-CM | POA: Diagnosis not present

## 2015-07-12 DIAGNOSIS — G8929 Other chronic pain: Secondary | ICD-10-CM

## 2015-07-12 DIAGNOSIS — E7211 Homocystinuria: Secondary | ICD-10-CM | POA: Insufficient documentation

## 2015-07-12 DIAGNOSIS — I10 Essential (primary) hypertension: Secondary | ICD-10-CM | POA: Insufficient documentation

## 2015-07-12 DIAGNOSIS — M961 Postlaminectomy syndrome, not elsewhere classified: Secondary | ICD-10-CM | POA: Diagnosis not present

## 2015-07-12 DIAGNOSIS — Z87891 Personal history of nicotine dependence: Secondary | ICD-10-CM | POA: Diagnosis not present

## 2015-07-12 DIAGNOSIS — Z5181 Encounter for therapeutic drug level monitoring: Secondary | ICD-10-CM

## 2015-07-12 MED ORDER — TRAMADOL HCL 50 MG PO TABS: 50.0000 mg | ORAL_TABLET | Freq: Two times a day (BID) | ORAL | Status: DC | PRN

## 2015-07-12 NOTE — Patient Instructions (Signed)
Will try another level for epidural injection  Left L3 and Left L4

## 2015-07-12 NOTE — Progress Notes (Addendum)
Subjective:    Patient ID: [name], female    DOB: [birth year], 74 y.o.   MRN: [id number] CC:  Left buttock, anteromedial thigh and Left 2nd toe pain > RIght  Thigh pain HPI 74 yo female with hx of mutiple joint replacement (excluding Left hip) With several year history of low back pain radiating into the lower extremities. Patient states that she does not have left hip pain with walking.She underwent T12-L5 fusion at [facility] in 2014. Postoperatively she continues to have pain radiating down the left leg. She has had multiple L5 transforaminal injections done by Dr. Nelva Bush which pt states didn't work.  Also has had spinal cord stimulation trial which was not helpful. Recently seen by neurology and started on Lyrica 75 mg twice a day which was not effective.  Patient is having side effects of dizziness as well as nausea. She cannot tolerate it very well. Has been on gabapentin 800 mg 4 times per day, when she reduced her dose her pain got worse. Tramadol causes some itching on the backs of her arms only Patient has an allergy list oxycodone however she states that it really is not an allergy and she has tolerated it postoperatively  Currently goes to therapy at [employer]. Ambulates about half mile per day Patient takes hydrocodone in the afternoon around 3 or 4:00. Takes about 1800 mg of Tylenol per day 2 in the morning and 1  at noon    Pain Inventory Average Pain 7 Pain Right Now 6 My pain is burning and tingling  In the last 24 hours, has pain interfered with the following? General activity 7 Relation with others 7 Enjoyment of life 7 What TIME of day is your pain at its worst? evening Sleep (in general) Fair  Pain is worse with: walking and standing Pain improves with: rest, heat/ice and medication Relief from Meds: 6  Mobility walk without assistance how many minutes can you walk? 15 ability to climb steps?  yes  Function retired I  need assistance with the following:  household duties and shopping  Neuro/Psych bladder control problems bowel control problems weakness numbness tingling trouble walking  Prior Studies Any changes since last visit?  no  Physicians involved in your care Any changes since last visit?  no   Family History  Problem Relation Age of Onset  . Breast cancer Neg Hx   . Colon cancer Neg Hx   . Pancreatic cancer Neg Hx   . Stomach cancer Neg Hx   . Coronary artery disease Father     CABG at 72  . Prostate cancer Father   . Colon polyps Father   . Coronary artery disease Brother   . Colon polyps Brother    Social History   Social History  . Marital Status: Married    Spouse Name: N/A  . Number of Children: 2  . Years of Education: N/A   Occupational History  . Retired Pharmacist, hospital     retired  .     Social History Main Topics  . Smoking status: Former Smoker    Quit date: 09/12/1983  . Smokeless tobacco: Never Used  . Alcohol Use: No  . Drug Use: No  . Sexual Activity: Not Asked   Other Topics Concern  . None   Social History Narrative   Lives w/ husband of 87 years in a 3 story home. Has 2 sons.  Retired Education officer, museum.  Education: college.  Past Surgical History  Procedure Laterality Date  . Abdominal hysterectomy    . Oophorectomy    . Knee arthroscopy      L   replacement 2007, Dr Corinne Ports, then a manipulation, then redo TKR 11/09 w/ Dr.Alusio. Parcial replacement again 09-2010  . Total hip arthroplasty  3/11    Right   . Facelift    . Incisional hernia repair    . Knee surgery      x 5-  left  . Shoulder surgery      replacements: L 2010, R 2012  . Bakers cystectomy      right knee  . Total knee arthroplasty  05/20/2012    Procedure: TOTAL KNEE ARTHROPLASTY;  Surgeon: Gearlean Alf, MD;  Location: [facility];  Service: Orthopedics;  Laterality: Right;  . Back surgery  11-09-2011    at [facility], lumbar   Past Medical History  Diagnosis Date  .  History of elevated homocysteine   . Thyroid nodule     rt benign colloid nodule--left hyperplastic nodule- Thyroid bx 2006: neg  . Baker's cyst   . Goiter   . Adenomatous colon polyp 03/2003  . PONV (postoperative nausea and vomiting)   . Anxiety     ativan for sleep  . OA (osteoarthritis)      knee pain after TKR left , back pain- last steroid injection back 2 months ago Dr Nelva Bush  . Hypertension 09/11/2005  . Postlaminectomy syndrome, lumbar region     Dr. Nelva Bush, recommended doing bilateral S1 transforaminal epidural with D5W  . Lumbar radiculopathy     Dr. Nelva Bush  . Degenerative lumbar disc     Dr. Nelva Bush   BP 150/81 mmHg  Pulse 61  Resp 16  SpO2 98%  Opioid Risk Score:   Fall Risk Score:  `1  Depression screen PHQ 2/9  Depression screen [facility] 2/9 07/12/2015 03/16/2015 02/27/2013  Decreased Interest 0 0 0  Down, Depressed, Hopeless 0 0 0  PHQ - 2 Score 0 0 0     Review of Systems  Gastrointestinal: Positive for constipation.       Bowel Control Problems  Genitourinary: Positive for dysuria.       Bladder Control Problems  Neurological: Positive for weakness and numbness.       Tingling Gait Instability  All other systems reviewed and are negative.      Objective:   Physical Exam  Constitutional: She is oriented to person, place, and time. She appears well-developed and well-nourished.  HENT:  Head: Normocephalic and atraumatic.  Eyes: Conjunctivae and EOM are normal. Pupils are equal, round, and reactive to light.  Neck: Normal range of motion.  Cardiovascular: Normal rate and regular rhythm.   Pulmonary/Chest: Effort normal and breath sounds normal.  Abdominal: Soft. Bowel sounds are normal.  Musculoskeletal:       Right shoulder: She exhibits deformity. She exhibits normal range of motion and no tenderness.       Left shoulder: She exhibits deformity. She exhibits normal range of motion and no tenderness.       Right hip: She exhibits decreased range of motion  and tenderness.       Left hip: She exhibits tenderness. She exhibits normal range of motion.  Postoperative changes Bilateral shoulder  Neurological: She is alert and oriented to person, place, and time. No sensory deficit.  Reflex Scores:      Patellar reflexes are 0 on the right side and 0 on the left  side.      Achilles reflexes are 0 on the right side and 0 on the left side. Hyperalgesia over left medial knee, there are varicosities over that area.  Psychiatric: She has a normal mood and affect.  Nursing note and vitals reviewed.   110 deg flex knee r 95 deg flex knee left  Hypersensitive to touch Left anterior thigh and medial knee Patient has reduced lumbar range of motion she essentially has no flexion extension at the lumbar spine all her flexion-extension appears to come from the hip area. She has minimal lateral bending. Cervical range of motion is 75% flexion-extension lateral rotation and bending Thoracic range of motion reduced approximately 25-50% flexion extension and rotation     Assessment & Plan:  1. Lumbar post laminectomy syndrome status post T12-L5 fusion. 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 She's had spinal cord stimulator trial which was not helpful. She has not had any effect from L5 transforaminal injections. To her knowledge she has never had higher lumbar areas injected. Clinically she appears to have a chronic left L3-4 left L4 radiculopathy.Also has some symptoms going down to the left foot implicating L5  2. Left hip pain she has good range of motion. Certainly given her history of osteoarthritis this could be a pain generator other possibilities include sacroiliac pain radiating toward the hip. She does have a history of lumbar fusion which puts her at risk for this diagnosis. May consider either left sacroiliac injection versus left hip intra-articular injection the future. May recheck left hip x-ray although  she states she had one a year or 2 ago that looked "okay"  3. Chronic pain syndrome. She reports some sensitivities to medication but one asked in more details she is not so sure. She says she's had oxycodone since her last surgery and did not have any side effects. She also has had tramadol in the past causing some itching in her arms but did not have more of a generalized itching. We discussed that this may not be a allergic reaction to tramadol. Given that she does well with a fairly small dose of hydrocodone once a day at think she may be a good candidate for a trial of tramadol 50-100 mg every day. If this is not helpful may consider other options such as Nucynta given her neurogenic pain combined with her Musculoskeletal pain.

## 2015-07-13 LAB — PMP ALCOHOL METABOLITE (ETG)

## 2015-07-16 LAB — OPIATES/OPIOIDS (LC/MS-MS)
Codeine Urine: NEGATIVE ng/mL (ref <50)
Hydrocodone: NEGATIVE ng/mL — AB (ref <50)
Hydromorphone: 132 ng/mL (ref <50)
Morphine Urine: NEGATIVE ng/mL (ref <50)
Norhydrocodone, Ur: 181 ng/mL (ref <50)
Noroxycodone, Ur: NEGATIVE ng/mL (ref <50)
Oxycodone, ur: NEGATIVE ng/mL (ref <50)
Oxymorphone: NEGATIVE ng/mL (ref <50)

## 2015-07-16 LAB — ETHYL GLUCURONIDE, URINE
Ethyl Glucuronide (EtG): 907 ng/mL — ABNORMAL HIGH (ref <500)
Ethyl Sulfate (ETS): 377 ng/mL — ABNORMAL HIGH (ref <100)

## 2015-07-17 LAB — PRESCRIPTION MONITORING PROFILE (SOLSTAS)
Amphetamine/Meth: NEGATIVE ng/mL
Barbiturate Screen, Urine: NEGATIVE ng/mL
Benzodiazepine Screen, Urine: NEGATIVE ng/mL
Buprenorphine, Urine: NEGATIVE ng/mL
Cannabinoid Scrn, Ur: NEGATIVE ng/mL
Carisoprodol, Urine: NEGATIVE ng/mL
Cocaine Metabolites: NEGATIVE ng/mL
Creatinine, Urine: 36.42 mg/dL (ref >20.0)
Fentanyl, Ur: NEGATIVE ng/mL
MDMA URINE: NEGATIVE ng/mL
Meperidine, Ur: NEGATIVE ng/mL
Methadone Screen, Urine: NEGATIVE ng/mL
Nitrites, Initial: NEGATIVE ug/mL
Oxycodone Screen, Ur: NEGATIVE ng/mL
Propoxyphene: NEGATIVE ng/mL
Tapentadol, urine: NEGATIVE ng/mL
Tramadol Scrn, Ur: NEGATIVE ng/mL
Zolpidem, Urine: NEGATIVE ng/mL
pH, Initial: 6.9 pH (ref 4.5–8.9)

## 2015-07-26 ENCOUNTER — Telehealth: Payer: Self-pay | Admitting: Internal Medicine

## 2015-07-26 NOTE — Telephone Encounter (Signed)
Okay to schedule at her convenience. Please put 2-15 minute appts together. Also will need OV notes from Pain Management.

## 2015-07-26 NOTE — Telephone Encounter (Signed)
Pt calling stating she changed providers a couple months ago bc she was misinformed about referral Dr. Larose Kells placed to pain mgmt. She has now been to pain mgmt. She was upset that it took so long to get it and assumed that we did not follow up properly with the referral. She is stating the new provider she changed to was a mistake and that he told her she has high cholesterol. She is wanting to come back to see Dr. Larose Kells. Please advise.

## 2015-07-27 ENCOUNTER — Ambulatory Visit: Payer: Medicare Other | Admitting: Neurology

## 2015-07-28 NOTE — Progress Notes (Signed)
Urine drug screen for this encounter is consistent for prescribed medication 

## 2015-08-02 ENCOUNTER — Ambulatory Visit (HOSPITAL_BASED_OUTPATIENT_CLINIC_OR_DEPARTMENT_OTHER): Payer: Medicare Other | Admitting: Physical Medicine & Rehabilitation

## 2015-08-02 ENCOUNTER — Encounter: Payer: Medicare Other | Attending: Physical Medicine & Rehabilitation

## 2015-08-02 ENCOUNTER — Encounter: Payer: Self-pay | Admitting: Physical Medicine & Rehabilitation

## 2015-08-02 VITALS — BP 114/45 | HR 50 | Resp 14

## 2015-08-02 DIAGNOSIS — M1990 Unspecified osteoarthritis, unspecified site: Secondary | ICD-10-CM | POA: Insufficient documentation

## 2015-08-02 DIAGNOSIS — M961 Postlaminectomy syndrome, not elsewhere classified: Secondary | ICD-10-CM | POA: Diagnosis not present

## 2015-08-02 DIAGNOSIS — E7211 Homocystinuria: Secondary | ICD-10-CM | POA: Diagnosis not present

## 2015-08-02 DIAGNOSIS — M25552 Pain in left hip: Secondary | ICD-10-CM | POA: Diagnosis not present

## 2015-08-02 DIAGNOSIS — Z87891 Personal history of nicotine dependence: Secondary | ICD-10-CM | POA: Diagnosis not present

## 2015-08-02 DIAGNOSIS — Z5181 Encounter for therapeutic drug level monitoring: Secondary | ICD-10-CM | POA: Diagnosis not present

## 2015-08-02 DIAGNOSIS — G8929 Other chronic pain: Secondary | ICD-10-CM | POA: Insufficient documentation

## 2015-08-02 DIAGNOSIS — F419 Anxiety disorder, unspecified: Secondary | ICD-10-CM | POA: Insufficient documentation

## 2015-08-02 DIAGNOSIS — M5416 Radiculopathy, lumbar region: Secondary | ICD-10-CM

## 2015-08-02 DIAGNOSIS — I10 Essential (primary) hypertension: Secondary | ICD-10-CM | POA: Diagnosis not present

## 2015-08-02 MED ORDER — TRAMADOL HCL 50 MG PO TABS: 100.0000 mg | ORAL_TABLET | Freq: Two times a day (BID) | ORAL | Status: DC

## 2015-08-02 NOTE — Progress Notes (Signed)
  PROCEDURE RECORD [facility]   Name: AISATOU PENNOYER DOB:[birth year] MRN: [id number]  Date:08/02/2015  Physician: Alysia Penna, MD    Nurse/CMA: Mancel Parsons  Allergies:  Allergies  Allergen Reactions  . Oxycodone-Acetaminophen Other (See Comments) and Nausea Only    Consent Signed: Yes.    Is patient diabetic? No.  CBG today? na  Pregnant: No. LMP: No LMP recorded. Patient has had a hysterectomy. (age [age])  Anticoagulants: no Anti-inflammatory: no Antibiotics: no  Procedure: left transforaminal epidural steroid injection  Position: Prone Start Time: 11:04am  End Time: 11:19am  Fluoro Time: 1:04  RN/CMA Daryel November    Time 10:50am 11:21am    BP 114/45 133/52    Pulse 50 52    Respirations 14 14    O2 Sat 100 100    S/S 6 6    Pain Level 5/10 3/10     D/C home with daughter in law, [name], D/C instructions reviewed, and sits independently.

## 2015-08-02 NOTE — Patient Instructions (Signed)
You received an epidural steroid injection under fluoroscopic guidance. This is the most accurate way to perform an epidural injection. This injection was performed to relieve thigh or leg or foot pain that may be related to a pinched nerve in the lumbar spine. The local anesthetic injected today may cause numbness in your leg for a couple hours. If it is severe we may need to observe you for 30-60 minutes after the injection. The cortisone medicine injected today may take several days to take full effect. This medicine can also cause facial flushing or feeling of being warm.  This injection may last for days weeks or months. It can be repeated if needed. If it is not effective, another spinal level may need to be injected. Other treatments include medication management as well as physical therapy. In some cases surgery may be an option.  Your next option would be a caudal epidural injection

## 2015-08-02 NOTE — Progress Notes (Signed)
Left L3 and left L4 Lumbar transforaminal epidural steroid injection under fluoroscopic guidance  Indication: Lumbosacral radiculitis is not relieved by medication management or other conservative care and interfering with self-care and mobility.   Informed consent was obtained after describing risk and benefits of the procedure with the patient, this includes bleeding, bruising, infection, paralysis and medication side effects.  The patient wishes to proceed and has given written consent.  Patient was placed in prone position.  The lumbar area was marked and prepped with Betadine.  It was entered with a 25-gauge 1-1/2 inch needle and one mL of 1% lidocaine was injected into the skin and subcutaneous tissue.  Then a 22-gauge 3.5" spinal needle was inserted into the Left L3-4 intervertebral foramen under AP, lateral, and oblique view.  Then a solution containing one mL of 10 mg per mL dexamethasone and 2 mL of 1% lidocaine was injected.  Then a 22-gauge 3.5" spinal needle was inserted into the Left L4-5 intervertebral foramen under AP, lateral, and oblique view.  Then a solution containing one mL of 10 mg per mL dexamethasone and 2 mL of 1% lidocaine was injected. The patient tolerated procedure well.  Post procedure instructions were given.  Please see post procedure form. Preinjection pain 5/10 Postinjection pain 3/10  Left anterior thigh numbness post injection

## 2015-08-03 ENCOUNTER — Telehealth: Payer: Self-pay

## 2015-08-03 NOTE — Telephone Encounter (Signed)
Spoke to pt is doing better no pain meds today, looking forward to her next visit/procedure on 12/13

## 2015-08-03 NOTE — Telephone Encounter (Signed)
Patient called complaining of worse pain now, her feet are burning and its hard to stand.  She would like to know what to do?

## 2015-08-04 ENCOUNTER — Encounter: Payer: Self-pay | Admitting: Internal Medicine

## 2015-08-04 ENCOUNTER — Ambulatory Visit (INDEPENDENT_AMBULATORY_CARE_PROVIDER_SITE_OTHER): Payer: Medicare Other | Admitting: Internal Medicine

## 2015-08-04 VITALS — BP 118/74 | HR 49 | Temp 97.7°F | Ht 65.0 in | Wt 174.5 lb

## 2015-08-04 DIAGNOSIS — R7989 Other specified abnormal findings of blood chemistry: Secondary | ICD-10-CM

## 2015-08-04 DIAGNOSIS — G4700 Insomnia, unspecified: Secondary | ICD-10-CM

## 2015-08-04 DIAGNOSIS — F411 Generalized anxiety disorder: Secondary | ICD-10-CM

## 2015-08-04 DIAGNOSIS — E785 Hyperlipidemia, unspecified: Secondary | ICD-10-CM | POA: Diagnosis not present

## 2015-08-04 DIAGNOSIS — I10 Essential (primary) hypertension: Secondary | ICD-10-CM

## 2015-08-04 DIAGNOSIS — E7211 Homocystinuria: Secondary | ICD-10-CM | POA: Diagnosis not present

## 2015-08-04 DIAGNOSIS — Z09 Encounter for follow-up examination after completed treatment for conditions other than malignant neoplasm: Secondary | ICD-10-CM

## 2015-08-04 LAB — COMPREHENSIVE METABOLIC PANEL
ALT: 14 U/L (ref 0–35)
AST: 25 U/L (ref 0–37)
Albumin: 4.3 g/dL (ref 3.5–5.2)
Alkaline Phosphatase: 78 U/L (ref 39–117)
BUN: 18 mg/dL (ref 6–23)
CO2: 31 mEq/L (ref 19–32)
Calcium: 10 mg/dL (ref 8.4–10.5)
Chloride: 95 mEq/L — ABNORMAL LOW (ref 96–112)
Creatinine, Ser: 0.97 mg/dL (ref 0.40–1.20)
GFR: 59.56 mL/min — ABNORMAL LOW (ref >60.00)
Glucose, Bld: 85 mg/dL (ref 70–99)
Potassium: 5.1 mEq/L (ref 3.5–5.1)
Sodium: 135 mEq/L (ref 135–145)
Total Bilirubin: 0.5 mg/dL (ref 0.2–1.2)
Total Protein: 6.7 g/dL (ref 6.0–8.3)

## 2015-08-04 LAB — LIPID PANEL
Cholesterol: 192 mg/dL (ref 0–200)
HDL: 91.2 mg/dL (ref >39.00)
LDL Cholesterol: 78 mg/dL (ref 0–99)
NonHDL: 100.62
Total CHOL/HDL Ratio: 2
Triglycerides: 113 mg/dL (ref 0.0–149.0)
VLDL: 22.6 mg/dL (ref 0.0–40.0)

## 2015-08-04 MED ORDER — LORAZEPAM 0.5 MG PO TABS: 0.5000 mg | ORAL_TABLET | Freq: Every evening | ORAL | Status: DC | PRN

## 2015-08-04 NOTE — Patient Instructions (Signed)
Get your blood work before you leave     Please get the records from your blood work 2 months ago at the other doctor.  Next visit  for a physical exam in 6 months, fasting.      Please schedule an appointment at the front desk

## 2015-08-04 NOTE — Progress Notes (Signed)
Subjective:    Patient ID: [name], female    DOB: [birth year], 74 y.o.   MRN: [id number]  DOS:  08/04/2015 Type of visit - description : Routine checkup Interval history: Since the last visit, she was seen at another clinic, cholesterol was checked, she was prescribed simvastatin 2 months ago, no apparent side effects. Back pain: Was seen by Dr. Read Drivers, had a shot 2 days ago, feeling better. HTN: Good compliance of medication, ambulatory BPs always within normal   Review of Systems  Denies chest pain or difficulty breathing No nausea, vomiting, diarrhea.  Past Medical History  Diagnosis Date  . History of elevated homocysteine   . Thyroid nodule     rt benign colloid nodule--left hyperplastic nodule- Thyroid bx 2006: neg  . Baker's cyst   . Goiter   . Adenomatous colon polyp 03/2003  . PONV (postoperative nausea and vomiting)   . Anxiety     ativan for sleep  . OA (osteoarthritis)      knee pain after TKR left , back pain- last steroid injection back 2 months ago Dr Nelva Bush  . Hypertension 09/11/2005  . Postlaminectomy syndrome, lumbar region     Dr. Nelva Bush, recommended doing bilateral S1 transforaminal epidural with D5W  . Lumbar radiculopathy     Dr. Nelva Bush  . Degenerative lumbar disc     Dr. Nelva Bush    Past Surgical History  Procedure Laterality Date  . Abdominal hysterectomy    . Oophorectomy    . Knee arthroscopy      L   replacement 2007, Dr Corinne Ports, then a manipulation, then redo TKR 11/09 w/ Dr.Alusio. Parcial replacement again 09-2010  . Total hip arthroplasty  3/11    Right   . Facelift    . Incisional hernia repair    . Knee surgery      x 5-  left  . Shoulder surgery      replacements: L 2010, R 2012  . Bakers cystectomy      right knee  . Total knee arthroplasty  05/20/2012    Procedure: TOTAL KNEE ARTHROPLASTY;  Surgeon: Gearlean Alf, MD;  Location: [facility];  Service: Orthopedics;  Laterality: Right;  . Back surgery  11-09-2011    at [facility], lumbar      Social History   Social History  . Marital Status: Married    Spouse Name: N/A  . Number of Children: 2  . Years of Education: N/A   Occupational History  . Retired Pharmacist, hospital     retired  .     Social History Main Topics  . Smoking status: Former Smoker    Quit date: 09/12/1983  . Smokeless tobacco: Never Used  . Alcohol Use: No  . Drug Use: No  . Sexual Activity: Not on file   Other Topics Concern  . Not on file   Social History Narrative   Lives w/ husband of 70 years in a 3 story home. Has 2 sons.  Retired Education officer, museum.  Education: college.                 Medication List       This list is accurate as of: 08/04/15 11:59 PM.  Always use your most recent med list.               aspirin 81 MG tablet  Take 81 mg by mouth daily.     CALCIUM + D PO  Take 600 mg by  mouth daily after breakfast.     gabapentin 800 MG tablet  Commonly known as:  NEURONTIN  Take 1 tablet (800 mg total) by mouth 4 (four) times daily.     HYDROcodone-acetaminophen 10-325 MG tablet  Commonly known as:  NORCO  Take 1 tablet by mouth every evening.     hydrocortisone 2.5 % cream     lisinopril-hydrochlorothiazide 20-12.5 MG tablet  Commonly known as:  PRINZIDE,ZESTORETIC  Take 1 tablet by mouth daily.     LORazepam 0.5 MG tablet  Commonly known as:  ATIVAN  Take 1 tablet (0.5 mg total) by mouth at bedtime as needed for sleep.     simvastatin 20 MG tablet  Commonly known as:  ZOCOR     traMADol 50 MG tablet  Commonly known as:  ULTRAM  Take 2 tablets (100 mg total) by mouth 2 (two) times daily.     tretinoin 0.1 % cream  Commonly known as:  RETIN-A     TYLENOL PO  Take by mouth as needed.           Objective:   Physical Exam BP 118/74 mmHg  Pulse 49  Temp(Src) 97.7 F (36.5 C) (Oral)  Ht 5\' 5"  (1.651 m)  Wt 174 lb 8 oz (79.153 kg)  BMI 29.04 kg/m2  SpO2 99% General:   Well developed, well nourished . NAD.  HEENT:  Normocephalic . Face symmetric,  atraumatic Lungs:  CTA B Normal respiratory effort, no intercostal retractions, no accessory muscle use. Heart: RRR,  no murmur.  No pretibial edema bilaterally  Skin: Not pale. Not jaundice Neurologic:  alert & oriented X3.  Speech normal, gait appropriate for age and unassisted Psych--  Cognition and judgment appear intact.  Cooperative with normal attention span and concentration.  Behavior appropriate. No anxious or depressed appearing.      Assessment & Plan:   Assessment> HTN Anxiety, insomnia Elevated homocysteine Goiter, Thyroid nodule BX 2006 neg MSK: ---DJD, knee. ---Postlaminectomy syndrome, back surgery 2013, due ---Intolerant to Lyrica   Plan: HTN: Seems well-controlled, check a CMP Hyperlipidemia: 2 months ago at another office cholesterol was checked and she was prescribed simvastatin. No apparent side effects. Labs. Asked patient to bring records. Anxiety insomnia: Currently doing well, used to take Ativan, it helped tremendously. Will refill. Pain management: Just had a local injection by pain management,. Takes tramadol and hydrocodone as needed. Abnormal homocysteine? Check labs RTC 6 months

## 2015-08-04 NOTE — Progress Notes (Signed)
Pre visit review using our clinic review tool, if applicable. No additional management support is needed unless otherwise documented below in the visit note. 

## 2015-08-05 LAB — HOMOCYSTEINE: Homocysteine: 27.7 umol/L — ABNORMAL HIGH (ref <10.4)

## 2015-08-07 DIAGNOSIS — Z09 Encounter for follow-up examination after completed treatment for conditions other than malignant neoplasm: Secondary | ICD-10-CM | POA: Insufficient documentation

## 2015-08-07 NOTE — Assessment & Plan Note (Signed)
HTN: Seems well-controlled, check a CMP Hyperlipidemia: 2 months ago at another office cholesterol was checked and she was prescribed simvastatin. No apparent side effects. Labs. Asked patient to bring records. Anxiety insomnia: Currently doing well, used to take Ativan, it helped tremendously. Will refill. Pain management: Just had a local injection by pain management,. Takes tramadol and hydrocodone as needed. Abnormal homocysteine? Check labs RTC 6 months

## 2015-08-11 MED ORDER — FOLIC ACID 1 MG PO TABS: 1.0000 mg | ORAL_TABLET | Freq: Every day | ORAL | Status: DC

## 2015-08-11 NOTE — Addendum Note (Signed)
Addended by: Wilfrid Lund on: 08/11/2015 02:42 PM   Modules accepted: Orders

## 2015-08-14 DIAGNOSIS — J069 Acute upper respiratory infection, unspecified: Secondary | ICD-10-CM | POA: Diagnosis not present

## 2015-08-24 ENCOUNTER — Ambulatory Visit (HOSPITAL_BASED_OUTPATIENT_CLINIC_OR_DEPARTMENT_OTHER): Payer: Medicare Other | Admitting: Physical Medicine & Rehabilitation

## 2015-08-24 ENCOUNTER — Encounter: Payer: Self-pay | Admitting: Physical Medicine & Rehabilitation

## 2015-08-24 VITALS — BP 129/50 | HR 55 | Resp 14

## 2015-08-24 DIAGNOSIS — G8929 Other chronic pain: Secondary | ICD-10-CM

## 2015-08-24 DIAGNOSIS — M5416 Radiculopathy, lumbar region: Secondary | ICD-10-CM | POA: Diagnosis not present

## 2015-08-24 DIAGNOSIS — E7211 Homocystinuria: Secondary | ICD-10-CM | POA: Diagnosis not present

## 2015-08-24 DIAGNOSIS — M25552 Pain in left hip: Secondary | ICD-10-CM | POA: Diagnosis not present

## 2015-08-24 DIAGNOSIS — Z5181 Encounter for therapeutic drug level monitoring: Secondary | ICD-10-CM | POA: Insufficient documentation

## 2015-08-24 DIAGNOSIS — M961 Postlaminectomy syndrome, not elsewhere classified: Secondary | ICD-10-CM | POA: Insufficient documentation

## 2015-08-24 DIAGNOSIS — Z87891 Personal history of nicotine dependence: Secondary | ICD-10-CM | POA: Insufficient documentation

## 2015-08-24 DIAGNOSIS — M1990 Unspecified osteoarthritis, unspecified site: Secondary | ICD-10-CM | POA: Insufficient documentation

## 2015-08-24 DIAGNOSIS — I10 Essential (primary) hypertension: Secondary | ICD-10-CM | POA: Diagnosis not present

## 2015-08-24 DIAGNOSIS — F419 Anxiety disorder, unspecified: Secondary | ICD-10-CM | POA: Insufficient documentation

## 2015-08-24 NOTE — Progress Notes (Signed)
  PROCEDURE RECORD [facility]   Name: [name] DOB:1940-10-30 MRN: [id number]  Date:08/24/2015  Physician: Alysia Penna, MD    Nurse/CMA: Gara Kroner, CMA  Allergies:  Allergies  Allergen Reactions  . Oxycodone-Acetaminophen Other (See Comments) and Nausea Only    Consent Signed: Yes.    Is patient diabetic? No.  CBG today? N/A  Pregnant: No. LMP: No LMP recorded. Patient has had a hysterectomy. (age [age])  Anticoagulants: no Anti-inflammatory: yes (Tylenol) Antibiotics: no  Procedure: Caudal Steroid Injection Position: Prone Start Time: 11:40 am End Time: 11:50 am Fluoro Time: 19  RN/CMA Harrison Z[facility] [employer]	[place]    Time 11:08am 11:52 am    BP 129/50 148/62    Pulse 55 56    Respirations 14 14    O2 Sat 98 98    S/S 6 6    Pain Level 4/10 4/10     D/C home with daughter in law, [name], D/C instructions reviewed, and sits independently.

## 2015-08-24 NOTE — Patient Instructions (Addendum)
You received an epidural steroid injection under fluoroscopic guidance. This is the most accurate way to perform an epidural injection. This injection was performed to relieve thigh or leg or foot pain that may be related to a pinched nerve in the lumbar spine. The local anesthetic injected today may cause numbness in your leg for a couple hours. If it is severe we may need to observe you for 30-60 minutes after the injection. The cortisone medicine injected today may take several days to take full effect. This medicine can also cause facial flushing or feeling of being warm.  This injection may last for days weeks or months. It can be repeated if needed. If it is not effective, another spinal level may need to be injected. Other treatments include medication management as well as physical therapy. In some cases surgery may be an option.  Your next option would be a Repeat caudal epidural injection, If this injection is helpful  Discuss further treatment options next month

## 2015-08-24 NOTE — Progress Notes (Signed)
Caudal epidural injection Informed consent was obtained after describing risks and benefits of the procedure the patient is include bleeding bruising and infection she elects to proceed and has given written consent. Patient placed prone on fluoroscopy table Betadine prep Sterile drape 25-gauge 1.5 inch needle was used to anesthetize the skin and subcutaneous tissue after identifying the sacral hiatus on fluoroscopic AP views Then a 22-gauge 1.5 inch needle was inserted into the sacral hiatus. Lateral views were utilized to maneuver the needle into the sacral canal Under AP views 3 cc of Omnipaque 180 injected demonstrating appropriate spread of injectate Then a solution containing 12 mg of Celestone and 3 cc of 1% MPF of lidocaine were injected. Patient tolerated procedure well Post procedure instructions given

## 2015-09-02 ENCOUNTER — Telehealth: Payer: Self-pay | Admitting: *Deleted

## 2015-09-02 NOTE — Telephone Encounter (Signed)
Called to say that she is in severe pain and the tramadol is not covering it.  She is asking for increase or something else.  She said to leave a message on her cell phone.  I called her back and told her that unfortunately with Dr Letta Pate being out of office until 09/07/15 there was nothing we could do. I advised her if her pain was worse she would need to be evaluated in an urgent care or ED over the holiday as we are closed 12/23-26 reopening 12/27.  I will forward her message to Dr Letta Pate.

## 2015-09-04 NOTE — Telephone Encounter (Signed)
Need to may appt, would start Nucynta 50mg  TID #90

## 2015-09-07 NOTE — Telephone Encounter (Signed)
See if we can get her in with Zella Ball this week or do a nurse visit to sign a opioids consent, CSA as well as receive the prescription for Nucynta 50 3 times a day

## 2015-09-07 NOTE — Telephone Encounter (Signed)
She has appt scheduled with you on 09/27/15.  Do we give her RX for the nucynta?

## 2015-09-08 NOTE — Telephone Encounter (Signed)
done

## 2015-09-08 NOTE — Telephone Encounter (Signed)
Can you schedule appt with Zella Ball this week? Thanks!

## 2015-09-09 ENCOUNTER — Encounter: Payer: Self-pay | Admitting: Registered Nurse

## 2015-09-09 ENCOUNTER — Telehealth: Payer: Self-pay | Admitting: *Deleted

## 2015-09-09 ENCOUNTER — Encounter: Payer: Medicare Other | Attending: Physical Medicine & Rehabilitation | Admitting: Registered Nurse

## 2015-09-09 VITALS — BP 122/72 | HR 60 | Resp 14

## 2015-09-09 DIAGNOSIS — Z87891 Personal history of nicotine dependence: Secondary | ICD-10-CM | POA: Diagnosis not present

## 2015-09-09 DIAGNOSIS — R5381 Other malaise: Secondary | ICD-10-CM

## 2015-09-09 DIAGNOSIS — Z79899 Other long term (current) drug therapy: Secondary | ICD-10-CM

## 2015-09-09 DIAGNOSIS — M961 Postlaminectomy syndrome, not elsewhere classified: Secondary | ICD-10-CM

## 2015-09-09 DIAGNOSIS — M5416 Radiculopathy, lumbar region: Secondary | ICD-10-CM | POA: Diagnosis not present

## 2015-09-09 DIAGNOSIS — G8929 Other chronic pain: Secondary | ICD-10-CM

## 2015-09-09 DIAGNOSIS — M25552 Pain in left hip: Secondary | ICD-10-CM | POA: Diagnosis not present

## 2015-09-09 DIAGNOSIS — Z5181 Encounter for therapeutic drug level monitoring: Secondary | ICD-10-CM

## 2015-09-09 MED ORDER — TAPENTADOL HCL 50 MG PO TABS: 50.0000 mg | ORAL_TABLET | Freq: Three times a day (TID) | ORAL | Status: DC | PRN

## 2015-09-09 NOTE — Telephone Encounter (Signed)
I spoke with [name] and let her know that a prior [name] has been initiated for her [name].  She states that she has enough [name] to get through the weekend holiday.  I told her [name] said that she could take tid.

## 2015-09-09 NOTE — Progress Notes (Signed)
Subjective:    Patient ID: [name], female    DOB: September 02, 1941, 74 y.o.   MRN: [id number]  HPI: Ms. [name] is a 74 year old female who returns for follow up appointment and medication initiation. She states her pain is in her lower back radiating into bilateral lower extremities anteriorly. Her current exercise regime is attending [employer] twice a week and walking her dog daily. S/P Caudal Epidural Injection with no relief. Today she will be prescribed Nucynta 50 mg TID, narcotic policy reviewed and contract signed. She verbalizes understanding.   Pain Inventory Average Pain 7 Pain Right Now 7 My pain is burning and tingling  In the last 24 hours, has pain interfered with the following? General activity 9 Relation with others 8 Enjoyment of life NA What TIME of day is your pain at its worst? evening Sleep (in general) Fair  Pain is worse with: walking and standing Pain improves with: heat/ice Relief from Meds: NA  Mobility walk without assistance how many minutes can you walk? 15 ability to climb steps?  yes do you drive?  yes transfers alone  Function not employed: date last employed NA  Neuro/Psych weakness numbness tingling trouble walking  Prior Studies Any changes since last visit?  no  Physicians involved in your care Any changes since last visit?  no   Family History  Problem Relation Age of Onset  . Breast cancer Neg Hx   . Colon cancer Neg Hx   . Pancreatic cancer Neg Hx   . Stomach cancer Neg Hx   . Coronary artery disease Father     CABG at 3  . Prostate cancer Father   . Colon polyps Father   . Coronary artery disease Brother   . Colon polyps Brother    Social History   Social History  . Marital Status: Married    Spouse Name: N/A  . Number of Children: 2  . Years of Education: N/A   Occupational History  . Retired Pharmacist, hospital     retired  .     Social History Main Topics  . Smoking status: Former Smoker    Quit  date: 09/12/1983  . Smokeless tobacco: Never Used  . Alcohol Use: No  . Drug Use: No  . Sexual Activity: Not Asked   Other Topics Concern  . None   Social History Narrative   Lives w/ husband of 59 years in a 3 story home. Has 2 sons.  Retired Education officer, museum.  Education: college.            Past Surgical History  Procedure Laterality Date  . Abdominal hysterectomy    . Oophorectomy    . Knee arthroscopy      L   replacement 2007, Dr Corinne Ports, then a manipulation, then redo TKR 11/09 w/ Dr.Alusio. Parcial replacement again 09-2010  . Total hip arthroplasty  3/11    Right   . Facelift    . Incisional hernia repair    . Knee surgery      x 5-  left  . Shoulder surgery      replacements: L 2010, R 2012  . Bakers cystectomy      right knee  . Total knee arthroplasty  05/20/2012    Procedure: TOTAL KNEE ARTHROPLASTY;  Surgeon: Gearlean Alf, MD;  Location: [facility];  Service: Orthopedics;  Laterality: Right;  . Back surgery  11-09-2011    at [facility], lumbar   Past Medical  History  Diagnosis Date  . History of elevated homocysteine   . Thyroid nodule     rt benign colloid nodule--left hyperplastic nodule- Thyroid bx 2006: neg  . Baker's cyst   . Goiter   . Adenomatous colon polyp 03/2003  . PONV (postoperative nausea and vomiting)   . Anxiety     ativan for sleep  . OA (osteoarthritis)      knee pain after TKR left , back pain- last steroid injection back 2 months ago Dr Nelva Bush  . Hypertension 09/11/2005  . Postlaminectomy syndrome, lumbar region     Dr. Nelva Bush, recommended doing bilateral S1 transforaminal epidural with D5W  . Lumbar radiculopathy     Dr. Nelva Bush  . Degenerative lumbar disc     Dr. Nelva Bush   BP 122/72 mmHg  Pulse 60  Resp 14  SpO2 96%  Opioid Risk Score:   Fall Risk Score:  `1  Depression screen PHQ 2/9  Depression screen [facility] 2/9 07/12/2015 03/16/2015 02/27/2013  Decreased Interest 0 0 0  Down, Depressed, Hopeless 0 0 0  PHQ - 2 Score 0 0 0      Review  of Systems  Musculoskeletal: Positive for gait problem.  Neurological: Positive for weakness and numbness.       Tingling  All other systems reviewed and are negative.      Objective:   Physical Exam  Constitutional: She is oriented to person, place, and time. She appears well-developed and well-nourished.  HENT:  Head: Normocephalic and atraumatic.  Neck: Normal range of motion. Neck supple.  Cardiovascular: Normal rate and regular rhythm.   Pulmonary/Chest: Effort normal and breath sounds normal.  Musculoskeletal:  Normal Muscle Bulk and Muscle Testing Reveals: Upper Extremities: Full ROM and Muscle Strength 5/5 Lumbar Paraspinal Tenderness: L-3- L-5 Lower Extremities: Full ROM and Muscle Strength 5/5 Left Lower Extremity Flexion Produces Pain into Patella and Politeal fossa Arises from chair with ease Antalgic gait   Neurological: She is alert and oriented to person, place, and time.  Skin: Skin is warm and dry.  Psychiatric: She has a normal mood and affect.  Nursing note and vitals reviewed.         Assessment & Plan:  1. Lumbar post laminectomy syndrome status post T12-L5 fusion with chronic left L3-4 left L4 radiculopathy RX: Nucynta 50 mg one tablet three times a day. Continue Gabapentin 2. Left hip pain: No complaints today. Continue with HEP, heat and ice therapy. 3. Chronic pain syndrome.Nucynta Prescribed. Continue to Monitor  20 minutes of face to face patient care time was spent during this visit. All questions was encouraged and answered.

## 2015-09-09 NOTE — Telephone Encounter (Signed)
Nucynta needs a prior auth.  In the meantime [name] is asking ow much tramadol she can take to make her feel better until then.

## 2015-09-10 NOTE — Telephone Encounter (Signed)
Received call from [facility].  Nucynta 50 tablets has been approved 09/09/15-09/08/16. Left message per DPR on voicemail notifying [place] [facility] [place].

## 2015-09-15 ENCOUNTER — Telehealth: Payer: Self-pay | Admitting: *Deleted

## 2015-09-15 NOTE — Telephone Encounter (Signed)
Spoke with patient and given verbal warning.

## 2015-09-15 NOTE — Telephone Encounter (Signed)
-----   Message from Charlett Blake, MD sent at 09/14/2015  8:13 AM EST ----- + ETOH on UDS- give warning and check UDS next visit

## 2015-09-17 ENCOUNTER — Ambulatory Visit: Payer: Medicare Other | Admitting: Physical Therapy

## 2015-09-20 ENCOUNTER — Ambulatory Visit: Payer: Medicare Other | Attending: Registered Nurse | Admitting: Physical Therapy

## 2015-09-20 ENCOUNTER — Encounter: Payer: Self-pay | Admitting: Physical Therapy

## 2015-09-20 DIAGNOSIS — R262 Difficulty in walking, not elsewhere classified: Secondary | ICD-10-CM

## 2015-09-20 DIAGNOSIS — M5442 Lumbago with sciatica, left side: Secondary | ICD-10-CM | POA: Diagnosis not present

## 2015-09-20 DIAGNOSIS — M25552 Pain in left hip: Secondary | ICD-10-CM | POA: Diagnosis not present

## 2015-09-20 NOTE — Therapy (Signed)
[place], [place], [address] Phone: [phone]   Fax:  [phone]  Physical Therapy Evaluation  Patient Details  Name: [name] MRN: [id number] Date of Birth: November 08, 1940 Referring Provider: Letta Pate  Encounter Date: 09/20/2015      PT End of Session - 09/20/15 1301    Visit Number 1   Date for PT Re-Evaluation 11/18/15   PT Start Time 1040   PT Stop Time 1145   PT Time Calculation (min) 65 min   Activity Tolerance Patient tolerated treatment well   Behavior During Therapy [facility] for tasks assessed/performed      Past Medical History  Diagnosis Date  . History of elevated homocysteine   . Thyroid nodule     rt benign colloid nodule--left hyperplastic nodule- Thyroid bx 2006: neg  . Baker's cyst   . Goiter   . Adenomatous colon polyp 03/2003  . PONV (postoperative nausea and vomiting)   . Anxiety     ativan for sleep  . OA (osteoarthritis)      knee pain after TKR left , back pain- last steroid injection back 2 months ago Dr Nelva Bush  . Hypertension 09/11/2005  . Postlaminectomy syndrome, lumbar region     Dr. Nelva Bush, recommended doing bilateral S1 transforaminal epidural with D5W  . Lumbar radiculopathy     Dr. Nelva Bush  . Degenerative lumbar disc     Dr. Nelva Bush    Past Surgical History  Procedure Laterality Date  . Abdominal hysterectomy    . Oophorectomy    . Knee arthroscopy      L   replacement 2007, Dr Corinne Ports, then a manipulation, then redo TKR 11/09 w/ Dr.Alusio. Parcial replacement again 09-2010  . Total hip arthroplasty  3/11    Right   . Facelift    . Incisional hernia repair    . Knee surgery      x 5-  left  . Shoulder surgery      replacements: L 2010, R 2012  . Bakers cystectomy      right knee  . Total knee arthroplasty  05/20/2012    Procedure: TOTAL KNEE ARTHROPLASTY;  Surgeon: Gearlean Alf, MD;  Location: [facility];  Service: Orthopedics;  Laterality: Right;  .  Back surgery  11-09-2011    at [facility], lumbar    There were no vitals filed for this visit.  Visit Diagnosis:  Bilateral low back pain with left-sided sciatica - Plan: PT plan of care cert/re-cert  Left hip pain - Plan: PT plan of care cert/re-cert  Difficulty walking - Plan: PT plan of care cert/re-cert      Subjective Assessment - 09/20/15 1044    Subjective Patient has been in back pain for a number of years, she underwent a lumbar fusion in 2013.   Reports that she really did not get better after the surgery.  She has multiple injections with minimal relief.  She reports some relief with gabapentin   Patient Stated Goals have less pain   Currently in Pain? Yes   Pain Score 5    Pain Location Back   Pain Descriptors / Indicators Aching;Stabbing   Pain Type Chronic pain   Pain Radiating Towards pain radiates to left hip and down to big toe   Pain Onset More than a month ago   Pain Frequency Constant   Aggravating Factors  increased activity and as the day goes on pain a 10/10  Pain Relieving Factors   with gabapentin and rest pain at its best is a 3/10   Effect of Pain on Daily Activities really wears on me, changes my mood            [facility] PT Assessment - 09/20/15 0001    Assessment   Medical Diagnosis LBP   Referring Provider Kirsteins   Onset Date/Surgical Date 07/21/15   Prior Therapy 2 years ago   Precautions   Precautions None   Balance Screen   Has the patient fallen in the past 6 months No   Has the patient had a decrease in activity level because of a fear of falling?  No   Is the patient reluctant to leave their home because of a fear of falling?  No   Home Environment   Additional Comments does some housework and yardwork   Prior Function   Level of Independence Independent   Vocation Retired   Leisure 1-2 x/week will exercise   Posture/Postural Control   Posture Comments fwd head, gaurded posture   AROM   Overall AROM Comments Lumbar flexion  decreased 50%, extension decreased 100%, side bending decresed 75%   Strength   Overall Strength Comments 4-/5 for the LE's   Flexibility   Soft Tissue Assessment /Muscle Length --  tight ITB, calves and piriformis mms   Palpation   Palpation comment very tight and tender in the lumbar paraspinals, the SI, buttocks and into the ITB bilateally, "very tender"   Ambulation/Gait   Gait Comments gait is with a SPC, slow, very small steps, some scissoring, unable to straighten up, forward flexed trunk.                   [facility] Adult PT Treatment/Exercise - 09/20/15 0001    Exercises   Exercises Lumbar   Lumbar Exercises: Stretches   Passive Hamstring Stretch 3 reps;30 seconds   Double Knee to Chest Stretch 3 reps;20 seconds   Lower Trunk Rotation 3 reps;20 seconds   Hip Flexor Stretch 3 reps;20 seconds   Quad Stretch 3 reps;20 seconds   ITB Stretch 3 reps;20 seconds   Piriformis Stretch 3 reps;20 seconds   Lumbar Exercises: Aerobic   Tread Mill NuStep Level 5 x 5 minutes   UBE (Upper Arm Bike) Level 3 x 3 minutes   Lumbar Exercises: Machines for Strengthening   Other Lumbar Machine Exercise lats 20# 2x10   Moist Heat Therapy   Number Minutes Moist Heat 15 Minutes   Moist Heat Location Lumbar Spine   Electrical Stimulation   Electrical Stimulation Location lumbar left   Electrical Stimulation Action IFC   Electrical Stimulation Parameters tolerance   Electrical Stimulation Goals Pain                  PT Short Term Goals - 09/20/15 1309    PT SHORT TERM GOAL #1   Title independent iwth initial HEP   Time 2   Period Weeks   Status New           PT Long Term Goals - 09/20/15 1309    PT LONG TERM GOAL #1   Title understand proper posture and body mechanics instruction   Time 8   Period Weeks   Status New   PT LONG TERM GOAL #2   Title increase lumbar ROM 25%   Time 8   Period Weeks   Status New   PT LONG TERM GOAL #3   Title report  25% decrease  in pain   Time 8   Period Weeks   Status New   PT LONG TERM GOAL #4   Title increase LE strength to 4/5   Time 8   Period Weeks   Status New               Plan - 10/13/2015 1303    Clinical Impression Statement Patient with a long history of LBP, has had multiple surgeries with minimal relief and other procedures.  She returns today hoping to relieve some pain.  Has very limited ROM, spasms in the low back.  Has very tight hip flexors.     Pt will benefit from skilled therapeutic intervention in order to improve on the following deficits Abnormal gait;Cardiopulmonary status limiting activity;Decreased activity tolerance;Decreased balance;Decreased range of motion;Decreased mobility;Decreased strength;Difficulty walking;Impaired flexibility;Increased muscle spasms;Postural dysfunction;Pain;Improper body mechanics   Rehab Potential Good   PT Frequency 2x / week   PT Duration 8 weeks   PT Treatment/Interventions ADLs/Self Care Home Management;Electrical Stimulation;Moist Heat;Gait training;Functional mobility training;Therapeutic activities;Therapeutic exercise;Balance training;Patient/family education;Manual techniques   PT Next Visit Plan try flexibility and STM, as well as some gentle ROM and strengthening exercises.  She has had many different procedures done with minimal relief in the past so I feel that we should try something that have not been done.   Consulted and Agree with Plan of Care Patient          G-Codes - 13-Oct-2015 1311    Functional Assessment Tool Used foto 63% limitation   Functional Limitation Mobility: Walking and moving around   Mobility: Walking and Moving Around Current Status [phone]) At least 60 percent but less than 80 percent impaired, limited or restricted   Mobility: Walking and Moving Around Goal Status [phone]) At least 40 percent but less than 60 percent impaired, limited or restricted       Problem List Patient Active Problem List   Diagnosis  Date Noted  . PCP NOTES >>>>> 08/07/2015  . Allergic rhinitis 01/21/2015  . Leg edema 12/16/2013  . Weight loss 08/29/2012  . Lumbar canal stenosis 08/20/2012  . Medicare annual wellness visit, subsequent 11/03/2011  . Heart murmur 11/03/2011  . Dermatitis 01/19/2011  . THYROID NODULE 03/15/2010  . PERSONAL HX COLONIC POLYPS 04/30/2008  . FATIGUE 04/17/2008  . ANXIETY- Insomnia 01/27/2008  . Osteoarthritis 01/27/2008  . BAKER'S CYST 12/20/2006  . Essential hypertension 09/11/2005    Sumner Boast., PT 10-13-2015, 1:21 PM  [name], [place], [address] Phone: [phone]   Fax:  [phone]  Name: [name] MRN: [id number] Date of Birth: 30-Jun-1941

## 2015-09-24 ENCOUNTER — Ambulatory Visit: Payer: Medicare Other | Admitting: Physical Therapy

## 2015-09-24 ENCOUNTER — Ambulatory Visit: Payer: Medicare Other | Admitting: Physical Medicine & Rehabilitation

## 2015-09-24 ENCOUNTER — Encounter: Payer: Self-pay | Admitting: Physical Therapy

## 2015-09-24 DIAGNOSIS — R262 Difficulty in walking, not elsewhere classified: Secondary | ICD-10-CM | POA: Diagnosis not present

## 2015-09-24 DIAGNOSIS — M25552 Pain in left hip: Secondary | ICD-10-CM | POA: Diagnosis not present

## 2015-09-24 DIAGNOSIS — M5442 Lumbago with sciatica, left side: Secondary | ICD-10-CM

## 2015-09-24 NOTE — Therapy (Signed)
[place], [place], [address] Phone: [phone]   Fax:  [phone]  Physical Therapy Treatment  Patient Details  Name: [name] MRN: [id number] Date of Birth: [birth year] Referring Provider: Letta Pate  Encounter Date: 09/24/2015      PT End of Session - 09/24/15 1042    Visit Number 2   Date for PT Re-Evaluation 11/18/15   PT Start Time 1010   PT Stop Time 1105   PT Time Calculation (min) 55 min      Past Medical History  Diagnosis Date  . History of elevated homocysteine   . Thyroid nodule     rt benign colloid nodule--left hyperplastic nodule- Thyroid bx 2006: neg  . Baker's cyst   . Goiter   . Adenomatous colon polyp 03/2003  . PONV (postoperative nausea and vomiting)   . Anxiety     ativan for sleep  . OA (osteoarthritis)      knee pain after TKR left , back pain- last steroid injection back 2 months ago Dr Nelva Bush  . Hypertension 09/11/2005  . Postlaminectomy syndrome, lumbar region     Dr. Nelva Bush, recommended doing bilateral S1 transforaminal epidural with D5W  . Lumbar radiculopathy     Dr. Nelva Bush  . Degenerative lumbar disc     Dr. Nelva Bush    Past Surgical History  Procedure Laterality Date  . Abdominal hysterectomy    . Oophorectomy    . Knee arthroscopy      L   replacement 2007, Dr Corinne Ports, then a manipulation, then redo TKR 11/09 w/ Dr.Alusio. Parcial replacement again 09-2010  . Total hip arthroplasty  3/11    Right   . Facelift    . Incisional hernia repair    . Knee surgery      x 5-  left  . Shoulder surgery      replacements: L 2010, R 2012  . Bakers cystectomy      right knee  . Total knee arthroplasty  05/20/2012    Procedure: TOTAL KNEE ARTHROPLASTY;  Surgeon: Gearlean Alf, MD;  Location: [facility];  Service: Orthopedics;  Laterality: Right;  . Back surgery  11-09-2011    at [facility], lumbar    There were no vitals filed for this visit.  Visit Diagnosis:   Bilateral low back pain with left-sided sciatica  Left hip pain      Subjective Assessment - 09/24/15 1007    Subjective eval treatment helped,sore but overall helped   Currently in Pain? Yes   Pain Score 5    Pain Location Back                         [facility] Adult PT Treatment/Exercise - 09/24/15 0001    Lumbar Exercises: Aerobic   Tread Mill NuStep Level 5 x 5 minutes   Lumbar Exercises: Supine   Ab Set 15 reps;3 seconds  ball squeeze   Bent Knee Raise 10 reps;2 seconds   Straight Leg Raise 10 reps  with abd   Other Supine Lumbar Exercises Bridge with ball and obl with ball 15 times each   Moist Heat Therapy   Number Minutes Moist Heat 15 Minutes   Moist Heat Location Lumbar Spine   Electrical Stimulation   Electrical Stimulation Location lumbar left   Electrical Stimulation Action IFC   Electrical Stimulation Goals Pain   Manual Therapy   Manual Therapy Joint  mobilization;Soft tissue mobilization;Myofascial release;Passive ROM;Manual Traction;Neural Stretch   Soft tissue mobilization LB   Myofascial Release LB   Passive ROM LE and trunk   Manual Traction gentle trunk   Neural Stretch LE                  PT Short Term Goals - 09/20/15 1309    PT SHORT TERM GOAL #1   Title independent iwth initial HEP   Time 2   Period Weeks   Status New           PT Long Term Goals - 09/20/15 1309    PT LONG TERM GOAL #1   Title understand proper posture and body mechanics instruction   Time 8   Period Weeks   Status New   PT LONG TERM GOAL #2   Title increase lumbar ROM 25%   Time 8   Period Weeks   Status New   PT LONG TERM GOAL #3   Title report 25% decrease in pain   Time 8   Period Weeks   Status New   PT LONG TERM GOAL #4   Title increase LE strength to 4/5   Time 8   Period Weeks   Status New               Plan - 09/24/15 1042    Clinical Impression Statement initiated core stabd today, VCing needed. MT and STW  which pt tolerated well and got relief   PT Next Visit Plan try flexibility and STM, as well as some gentle ROM and strengthening exercises.  Core Stab!!!        Problem List Patient Active Problem List   Diagnosis Date Noted  . PCP NOTES >>>>> 08/07/2015  . Allergic rhinitis 01/21/2015  . Leg edema 12/16/2013  . Weight loss 08/29/2012  . Lumbar canal stenosis 08/20/2012  . Medicare annual wellness visit, subsequent 11/03/2011  . Heart murmur 11/03/2011  . Dermatitis 01/19/2011  . THYROID NODULE 03/15/2010  . PERSONAL HX COLONIC POLYPS 04/30/2008  . FATIGUE 04/17/2008  . ANXIETY- Insomnia 01/27/2008  . Osteoarthritis 01/27/2008  . BAKER'S CYST 12/20/2006  . Essential hypertension 09/11/2005    PAYSEUR,ANGIE PTA 09/24/2015, 10:44 AM  [place], [place], [address] Phone: [phone]   Fax:  [phone]  Name: JAMILYN WATCHORN MRN: [id number] Date of Birth: 1941-07-05

## 2015-09-27 ENCOUNTER — Ambulatory Visit: Payer: Medicare Other | Admitting: Physical Medicine & Rehabilitation

## 2015-09-27 ENCOUNTER — Other Ambulatory Visit: Payer: Self-pay | Admitting: Internal Medicine

## 2015-09-28 ENCOUNTER — Ambulatory Visit: Payer: Medicare Other | Admitting: Physical Therapy

## 2015-09-28 ENCOUNTER — Encounter: Payer: Self-pay | Admitting: Physical Therapy

## 2015-09-28 DIAGNOSIS — R262 Difficulty in walking, not elsewhere classified: Secondary | ICD-10-CM | POA: Diagnosis not present

## 2015-09-28 DIAGNOSIS — M25552 Pain in left hip: Secondary | ICD-10-CM | POA: Diagnosis not present

## 2015-09-28 DIAGNOSIS — M5442 Lumbago with sciatica, left side: Secondary | ICD-10-CM

## 2015-09-28 NOTE — Therapy (Signed)
[place], [place], [address] Phone: [phone]   Fax:  [phone]  Physical Therapy Treatment  Patient Details  Name: DAFINA LASHLEE MRN: [id number] Date of Birth: 1940/11/10 Referring Provider: Letta Pate  Encounter Date: 09/28/2015      PT End of Session - 09/28/15 1211    Visit Number 3   Date for PT Re-Evaluation 11/18/15   PT Start Time 1130   PT Stop Time 1225   PT Time Calculation (min) 55 min      Past Medical History  Diagnosis Date  . History of elevated homocysteine   . Thyroid nodule     rt benign colloid nodule--left hyperplastic nodule- Thyroid bx 2006: neg  . Baker's cyst   . Goiter   . Adenomatous colon polyp 03/2003  . PONV (postoperative nausea and vomiting)   . Anxiety     ativan for sleep  . OA (osteoarthritis)      knee pain after TKR left , back pain- last steroid injection back 2 months ago Dr Nelva Bush  . Hypertension 09/11/2005  . Postlaminectomy syndrome, lumbar region     Dr. Nelva Bush, recommended doing bilateral S1 transforaminal epidural with D5W  . Lumbar radiculopathy     Dr. Nelva Bush  . Degenerative lumbar disc     Dr. Nelva Bush    Past Surgical History  Procedure Laterality Date  . Abdominal hysterectomy    . Oophorectomy    . Knee arthroscopy      L   replacement 2007, Dr Corinne Ports, then a manipulation, then redo TKR 11/09 w/ Dr.Alusio. Parcial replacement again 09-2010  . Total hip arthroplasty  3/11    Right   . Facelift    . Incisional hernia repair    . Knee surgery      x 5-  left  . Shoulder surgery      replacements: L 2010, R 2012  . Bakers cystectomy      right knee  . Total knee arthroplasty  05/20/2012    Procedure: TOTAL KNEE ARTHROPLASTY;  Surgeon: Gearlean Alf, MD;  Location: [facility];  Service: Orthopedics;  Laterality: Right;  . Back surgery  11-09-2011    at [facility], lumbar    There were no vitals filed for this visit.  Visit Diagnosis:   Bilateral low back pain with left-sided sciatica      Subjective Assessment - 09/28/15 1133    Subjective tx does help temporary-hoping it will continue   Pain Score 4    Pain Location Back                         [facility] Adult PT Treatment/Exercise - 09/28/15 0001    Lumbar Exercises: Aerobic   Stationary Bike 6 min   Tread Mill NuStep Level 5 x 5 minutes   Lumbar Exercises: Supine   Other Supine Lumbar Exercises Core stab 20 min  VCing for tech needed as well as modifications d/t pain   Moist Heat Therapy   Number Minutes Moist Heat 15 Minutes   Moist Heat Location Lumbar Spine   Manual Therapy   Manual Therapy Joint mobilization;Soft tissue mobilization;Myofascial release;Passive ROM;Manual Traction;Neural Stretch   Soft tissue mobilization LB   Myofascial Release LB   Passive ROM LE and trunk   Manual Traction gentle trunk   Neural Stretch LE  PT Short Term Goals - 09/20/15 1309    PT SHORT TERM GOAL #1   Title independent iwth initial HEP   Time 2   Period Weeks   Status New           PT Long Term Goals - 09/20/15 1309    PT LONG TERM GOAL #1   Title understand proper posture and body mechanics instruction   Time 8   Period Weeks   Status New   PT LONG TERM GOAL #2   Title increase lumbar ROM 25%   Time 8   Period Weeks   Status New   PT LONG TERM GOAL #3   Title report 25% decrease in pain   Time 8   Period Weeks   Status New   PT LONG TERM GOAL #4   Title increase LE strength to 4/5   Time 8   Period Weeks   Status New               Plan - 09/28/15 1211    Clinical Impression Statement core stab did increase pain and required modifications,verba ndtactile cuing. Some cramping with PROM. Decreased trigger pts in LB and gluts noted today   PT Next Visit Plan Core Stab, PROM and MT        Problem List Patient Active Problem List   Diagnosis Date Noted  . PCP NOTES >>>>> 08/07/2015  .  Allergic rhinitis 01/21/2015  . Leg edema 12/16/2013  . Weight loss 08/29/2012  . Lumbar canal stenosis 08/20/2012  . Medicare annual wellness visit, subsequent 11/03/2011  . Heart murmur 11/03/2011  . Dermatitis 01/19/2011  . THYROID NODULE 03/15/2010  . PERSONAL HX COLONIC POLYPS 04/30/2008  . FATIGUE 04/17/2008  . ANXIETY- Insomnia 01/27/2008  . Osteoarthritis 01/27/2008  . BAKER'S CYST 12/20/2006  . Essential hypertension 09/11/2005    PAYSEUR,ANGIE PTA  09/28/2015, 12:13 PM  [place], [place], [address] Phone: [phone]   Fax:  [phone]  Name: SHERA WHITT[place]HILL MRN: [id number] Date of Birth: 21-Feb-1941

## 2015-09-30 ENCOUNTER — Encounter: Payer: Self-pay | Admitting: Physical Therapy

## 2015-09-30 ENCOUNTER — Ambulatory Visit: Payer: Medicare Other | Admitting: Physical Therapy

## 2015-09-30 DIAGNOSIS — M5442 Lumbago with sciatica, left side: Secondary | ICD-10-CM | POA: Diagnosis not present

## 2015-09-30 DIAGNOSIS — M25552 Pain in left hip: Secondary | ICD-10-CM | POA: Diagnosis not present

## 2015-09-30 DIAGNOSIS — R262 Difficulty in walking, not elsewhere classified: Secondary | ICD-10-CM | POA: Diagnosis not present

## 2015-09-30 NOTE — Therapy (Signed)
[place], [place], [address] Phone: [phone]   Fax:  [phone]  Physical Therapy Treatment  Patient Details  Name: [name] MRN: [id number] Date of Birth: 1941-06-25 Referring Provider: Letta Pate  Encounter Date: 09/30/2015      PT End of Session - 09/30/15 1526    Visit Number 4   Date for PT Re-Evaluation 11/18/15   PT Start Time [id number]   PT Stop Time 1540   PT Time Calculation (min) 55 min      Past Medical History  Diagnosis Date  . History of elevated homocysteine   . Thyroid nodule     rt benign colloid nodule--left hyperplastic nodule- Thyroid bx 2006: neg  . Baker's cyst   . Goiter   . Adenomatous colon polyp 03/2003  . PONV (postoperative nausea and vomiting)   . Anxiety     ativan for sleep  . OA (osteoarthritis)      knee pain after TKR left , back pain- last steroid injection back 2 months ago Dr Nelva Bush  . Hypertension 09/11/2005  . Postlaminectomy syndrome, lumbar region     Dr. Nelva Bush, recommended doing bilateral S1 transforaminal epidural with D5W  . Lumbar radiculopathy     Dr. Nelva Bush  . Degenerative lumbar disc     Dr. Nelva Bush    Past Surgical History  Procedure Laterality Date  . Abdominal hysterectomy    . Oophorectomy    . Knee arthroscopy      L   replacement 2007, Dr Corinne Ports, then a manipulation, then redo TKR 11/09 w/ Dr.Alusio. Parcial replacement again 09-2010  . Total hip arthroplasty  3/11    Right   . Facelift    . Incisional hernia repair    . Knee surgery      x 5-  left  . Shoulder surgery      replacements: L 2010, R 2012  . Bakers cystectomy      right knee  . Total knee arthroplasty  05/20/2012    Procedure: TOTAL KNEE ARTHROPLASTY;  Surgeon: Gearlean Alf, MD;  Location: [facility];  Service: Orthopedics;  Laterality: Right;  . Back surgery  11-09-2011    at [facility], lumbar    There were no vitals filed for this visit.  Visit Diagnosis:   Bilateral low back pain with left-sided sciatica      Subjective Assessment - 09/30/15 1447    Subjective very very sore after last session , all over but esp knees I think because they were bent so much   Currently in Pain? Yes   Pain Score 6    Pain Location Back                         [facility] Adult PT Treatment/Exercise - 09/30/15 0001    Lumbar Exercises: Aerobic   Stationary Bike 6 min   Lumbar Exercises: Seated   Sit to Stand Limitations trunk flex and ext 15 times each blue tband   Lumbar Exercises: Supine   Other Supine Lumbar Exercises Core stab 15 min  decreased activity with knees bent   Moist Heat Therapy   Number Minutes Moist Heat 15 Minutes   Moist Heat Location Lumbar Spine   Electrical Stimulation   Electrical Stimulation Location lumbar   Electrical Stimulation Action IFC   Electrical Stimulation Goals Pain   Manual Therapy   Manual Therapy Joint mobilization;Soft tissue mobilization;Myofascial  release;Passive ROM   Soft tissue mobilization LB   Myofascial Release LB   Passive ROM LE and trunk                  PT Short Term Goals - 09/30/15 1524    PT SHORT TERM GOAL #1   Title independent iwth initial HEP   Status Achieved           PT Long Term Goals - 09/30/15 1524    PT LONG TERM GOAL #1   Title understand proper posture and body mechanics instruction   Status On-going   PT LONG TERM GOAL #2   Title increase lumbar ROM 25%   Baseline much looser but still limited ROM   Status On-going   PT LONG TERM GOAL #3   Title report 25% decrease in pain   Baseline feels better after session but not lasting   Status On-going   PT LONG TERM GOAL #4   Title increase LE strength to 4/5   Status On-going               Plan - 09/30/15 1527    Clinical Impression Statement decreased knee bent core stab,much looser with ROM -pt continues with weak core. Pt verb increased activity tolerance-can now walk dog 1 mile   PT  Next Visit Plan Core Stab, PROM and MT        Problem List Patient Active Problem List   Diagnosis Date Noted  . PCP NOTES >>>>> 08/07/2015  . Allergic rhinitis 01/21/2015  . Leg edema 12/16/2013  . Weight loss 08/29/2012  . Lumbar canal stenosis 08/20/2012  . Medicare annual wellness visit, subsequent 11/03/2011  . Heart murmur 11/03/2011  . Dermatitis 01/19/2011  . THYROID NODULE 03/15/2010  . PERSONAL HX COLONIC POLYPS 04/30/2008  . FATIGUE 04/17/2008  . ANXIETY- Insomnia 01/27/2008  . Osteoarthritis 01/27/2008  . BAKER'S CYST 12/20/2006  . Essential hypertension 09/11/2005    Steven Basso,ANGIE PTA 09/30/2015, 3:29 PM  [place], [place], [address] Phone: [phone]   Fax:  [phone]  Name: [name] MRN: [id number] Date of Birth: April 21, 1941

## 2015-10-04 ENCOUNTER — Encounter: Payer: Medicare Other | Attending: Physical Medicine & Rehabilitation

## 2015-10-04 ENCOUNTER — Encounter: Payer: Self-pay | Admitting: Physical Medicine & Rehabilitation

## 2015-10-04 ENCOUNTER — Ambulatory Visit (HOSPITAL_BASED_OUTPATIENT_CLINIC_OR_DEPARTMENT_OTHER): Payer: Medicare Other | Admitting: Physical Medicine & Rehabilitation

## 2015-10-04 VITALS — BP 142/78 | HR 55 | Resp 14

## 2015-10-04 DIAGNOSIS — G8929 Other chronic pain: Secondary | ICD-10-CM | POA: Diagnosis not present

## 2015-10-04 DIAGNOSIS — M1990 Unspecified osteoarthritis, unspecified site: Secondary | ICD-10-CM | POA: Diagnosis not present

## 2015-10-04 DIAGNOSIS — G894 Chronic pain syndrome: Secondary | ICD-10-CM | POA: Diagnosis not present

## 2015-10-04 DIAGNOSIS — M25552 Pain in left hip: Secondary | ICD-10-CM | POA: Insufficient documentation

## 2015-10-04 DIAGNOSIS — Z5181 Encounter for therapeutic drug level monitoring: Secondary | ICD-10-CM

## 2015-10-04 DIAGNOSIS — E7211 Homocystinuria: Secondary | ICD-10-CM | POA: Insufficient documentation

## 2015-10-04 DIAGNOSIS — F419 Anxiety disorder, unspecified: Secondary | ICD-10-CM | POA: Diagnosis not present

## 2015-10-04 DIAGNOSIS — Z87891 Personal history of nicotine dependence: Secondary | ICD-10-CM | POA: Insufficient documentation

## 2015-10-04 DIAGNOSIS — M5416 Radiculopathy, lumbar region: Secondary | ICD-10-CM

## 2015-10-04 DIAGNOSIS — M961 Postlaminectomy syndrome, not elsewhere classified: Secondary | ICD-10-CM

## 2015-10-04 DIAGNOSIS — Z79899 Other long term (current) drug therapy: Secondary | ICD-10-CM | POA: Diagnosis not present

## 2015-10-04 DIAGNOSIS — I10 Essential (primary) hypertension: Secondary | ICD-10-CM | POA: Insufficient documentation

## 2015-10-04 MED ORDER — GABAPENTIN 800 MG PO TABS: 800.0000 mg | ORAL_TABLET | Freq: Four times a day (QID) | ORAL | Status: DC

## 2015-10-04 MED ORDER — TRAMADOL HCL 50 MG PO TABS: 100.0000 mg | ORAL_TABLET | Freq: Three times a day (TID) | ORAL | Status: DC

## 2015-10-04 NOTE — Progress Notes (Signed)
Subjective:    Patient ID: [name], female    DOB: 06-21-1941, 75 y.o.   MRN: [id number]  HPI Now walking dog 1 mile Doing theraband exercises at home Therapy x 4 sessions at [facility], 2 more scheduled Left leg burning discomfort, worse  Pain Inventory Average Pain 4 Pain Right Now 3 My pain is burning, tingling and aching  In the last 24 hours, has pain interfered with the following? General activity 5 Relation with others 2 Enjoyment of life 6 What TIME of day is your pain at its worst? evening Sleep (in general) Fair  Pain is worse with: walking, bending and standing Pain improves with: rest, heat/ice and medication Relief from Meds: NA  Mobility walk without assistance how many minutes can you walk? 20 ability to climb steps?  yes  Function retired  Neuro/Psych numbness tingling trouble walking  Prior Studies Any changes since last visit?  no  Physicians involved in your care Any changes since last visit?  no   Family History  Problem Relation Age of Onset  . Breast cancer Neg Hx   . Colon cancer Neg Hx   . Pancreatic cancer Neg Hx   . Stomach cancer Neg Hx   . Coronary artery disease Father     CABG at 25  . Prostate cancer Father   . Colon polyps Father   . Coronary artery disease Brother   . Colon polyps Brother    Social History   Social History  . Marital Status: Married    Spouse Name: N/A  . Number of Children: 2  . Years of Education: N/A   Occupational History  . Retired Pharmacist, hospital     retired  .     Social History Main Topics  . Smoking status: Former Smoker    Quit date: 09/12/1983  . Smokeless tobacco: Never Used  . Alcohol Use: No  . Drug Use: No  . Sexual Activity: Not Asked   Other Topics Concern  . None   Social History Narrative   Lives w/ husband of 35 years in a 3 story home. Has 2 sons.  Retired Education officer, museum.  Education: college.            Past Surgical History  Procedure Laterality Date  .  Abdominal hysterectomy    . Oophorectomy    . Knee arthroscopy      L   replacement 2007, Dr Corinne Ports, then a manipulation, then redo TKR 11/09 w/ Dr.Alusio. Parcial replacement again 09-2010  . Total hip arthroplasty  3/11    Right   . Facelift    . Incisional hernia repair    . Knee surgery      x 5-  left  . Shoulder surgery      replacements: L 2010, R 2012  . Bakers cystectomy      right knee  . Total knee arthroplasty  05/20/2012    Procedure: TOTAL KNEE ARTHROPLASTY;  Surgeon: Gearlean Alf, MD;  Location: [facility];  Service: Orthopedics;  Laterality: Right;  . Back surgery  11-09-2011    at [facility], lumbar   Past Medical History  Diagnosis Date  . History of elevated homocysteine   . Thyroid nodule     rt benign colloid nodule--left hyperplastic nodule- Thyroid bx 2006: neg  . Baker's cyst   . Goiter   . Adenomatous colon polyp 03/2003  . PONV (postoperative nausea and vomiting)   . Anxiety  ativan for sleep  . OA (osteoarthritis)      knee pain after TKR left , back pain- last steroid injection back 2 months ago Dr Nelva Bush  . Hypertension 09/11/2005  . Postlaminectomy syndrome, lumbar region     Dr. Nelva Bush, recommended doing bilateral S1 transforaminal epidural with D5W  . Lumbar radiculopathy     Dr. Nelva Bush  . Degenerative lumbar disc     Dr. Nelva Bush   BP 142/78 mmHg  Pulse 55  Resp 14  SpO2 100%  Opioid Risk Score:   Fall Risk Score:  `1  Depression screen PHQ 2/9  Depression screen [facility] 2/9 07/12/2015 03/16/2015 02/27/2013  Decreased Interest 0 0 0  Down, Depressed, Hopeless 0 0 0  PHQ - 2 Score 0 0 0     Review of Systems  Gastrointestinal: Positive for constipation.  Musculoskeletal: Positive for gait problem.  Neurological: Positive for numbness.       Tingling  All other systems reviewed and are negative.      Objective:   Physical Exam  Constitutional: She is oriented to person, place, and time. She appears well-developed and well-nourished.  HENT:    Head: Normocephalic and atraumatic.  Eyes: Conjunctivae are normal. Pupils are equal, round, and reactive to light.  Neurological: She is alert and oriented to person, place, and time.  Psychiatric: She has a normal mood and affect.  Nursing note and vitals reviewed.  Her strength is 5/5 bilateral deltoids, biceps, triceps, grip 5/5 bilateral hip flexion and extension and ankle dorsi flexion Sensation reduced to vibratory sensation in bilateral great toes, intact at the ankles. Pinprick sensation reduced in the left foot at the L5 and S1 dermatomes.       Assessment & Plan:  1. Lumbar post laminectomy syndrome status post T12-L5 fusion with chronic left  radiculopathy Last month RX: Nucynta 50 mg one tablet three times a day. Tried Nucynta for 2 days, which was helpful for sleep at night but pt read up on this and didn't want to continue.  Pt feels therapy has been beneficial, We will finish out 2 more visits and then patient to continue home exercise program. Continue Gabapentin, 800 mg 4 times a day Discontinue Nucynta him up patient will return bottle Tramadol 100 mg 3 times a day 5 refills Return to clinic 6 months

## 2015-10-04 NOTE — Addendum Note (Signed)
Addended by: Gara Kroner L on: 10/04/2015 01:34 PM   Modules accepted: Orders

## 2015-10-05 ENCOUNTER — Encounter: Payer: Self-pay | Admitting: Physical Therapy

## 2015-10-05 ENCOUNTER — Ambulatory Visit: Payer: Medicare Other | Admitting: Physical Therapy

## 2015-10-05 DIAGNOSIS — M5442 Lumbago with sciatica, left side: Secondary | ICD-10-CM

## 2015-10-05 DIAGNOSIS — R262 Difficulty in walking, not elsewhere classified: Secondary | ICD-10-CM | POA: Diagnosis not present

## 2015-10-05 DIAGNOSIS — M25552 Pain in left hip: Secondary | ICD-10-CM

## 2015-10-05 NOTE — Therapy (Signed)
[place], [place], [address] Phone: [phone]   Fax:  [phone]  Physical Therapy Treatment  Patient Details  Name: [name] MRN: [id number] Date of Birth: October 20, 1940 Referring Provider: Letta Pate  Encounter Date: 10/05/2015      PT End of Session - 10/05/15 1355    Visit Number 5   Date for PT Re-Evaluation 11/18/15   PT Start Time 1315   PT Stop Time 1415   PT Time Calculation (min) 60 min      Past Medical History  Diagnosis Date  . History of elevated homocysteine   . Thyroid nodule     rt benign colloid nodule--left hyperplastic nodule- Thyroid bx 2006: neg  . Baker's cyst   . Goiter   . Adenomatous colon polyp 03/2003  . PONV (postoperative nausea and vomiting)   . Anxiety     ativan for sleep  . OA (osteoarthritis)      knee pain after TKR left , back pain- last steroid injection back 2 months ago Dr Nelva Bush  . Hypertension 09/11/2005  . Postlaminectomy syndrome, lumbar region     Dr. Nelva Bush, recommended doing bilateral S1 transforaminal epidural with D5W  . Lumbar radiculopathy     Dr. Nelva Bush  . Degenerative lumbar disc     Dr. Nelva Bush    Past Surgical History  Procedure Laterality Date  . Abdominal hysterectomy    . Oophorectomy    . Knee arthroscopy      L   replacement 2007, Dr Corinne Ports, then a manipulation, then redo TKR 11/09 w/ Dr.Alusio. Parcial replacement again 09-2010  . Total hip arthroplasty  3/11    Right   . Facelift    . Incisional hernia repair    . Knee surgery      x 5-  left  . Shoulder surgery      replacements: L 2010, R 2012  . Bakers cystectomy      right knee  . Total knee arthroplasty  05/20/2012    Procedure: TOTAL KNEE ARTHROPLASTY;  Surgeon: Gearlean Alf, MD;  Location: [facility];  Service: Orthopedics;  Laterality: Right;  . Back surgery  11-09-2011    at [facility], lumbar    There were no vitals filed for this visit.  Visit Diagnosis:   Bilateral low back pain with left-sided sciatica  Left hip pain      Subjective Assessment - 10/05/15 1321    Subjective woke up this morning with legs burning   Currently in Pain? Yes   Pain Score 6    Pain Location Back                         [facility] Adult PT Treatment/Exercise - 10/05/15 0001    Lumbar Exercises: Aerobic   Stationary Bike 6 min   Tread Mill NuStep Level 5 x 6 minutes   Modalities   Modalities Iontophoresis   Moist Heat Therapy   Number Minutes Moist Heat 15 Minutes   Moist Heat Location Lumbar Spine   Electrical Stimulation   Electrical Stimulation Location lumbar   Electrical Stimulation Action IFC   Electrical Stimulation Goals Pain   Iontophoresis   Type of Iontophoresis Dexamethasone   Location Left SI   Dose 1.2 mA   Time 4 hour leave on   Manual Therapy   Manual Therapy Joint mobilization;Soft tissue mobilization;Passive ROM;Manual Traction   Soft tissue mobilization  LB   Manual Traction gentle trunk, gentle sheet and Left LE                  PT Short Term Goals - 09/30/15 1524    PT SHORT TERM GOAL #1   Title independent iwth initial HEP   Status Achieved           PT Long Term Goals - 09/30/15 1524    PT LONG TERM GOAL #1   Title understand proper posture and body mechanics instruction   Status On-going   PT LONG TERM GOAL #2   Title increase lumbar ROM 25%   Baseline much looser but still limited ROM   Status On-going   PT LONG TERM GOAL #3   Title report 25% decrease in pain   Baseline feels better after session but not lasting   Status On-going   PT LONG TERM GOAL #4   Title increase LE strength to 4/5   Status On-going               Plan - 10/05/15 1355    Clinical Impression Statement pt presents with increase Left SI pain and presents with Left post rotation, responded fair to gentle traction but very painfula ndlimited with STW   PT Next Visit Plan Core Stab, PROM and MT         Problem List Patient Active Problem List   Diagnosis Date Noted  . Lumbar post-laminectomy syndrome 10/04/2015  . Chronic lumbar radiculopathy 10/04/2015  . PCP NOTES >>>>> 08/07/2015  . Allergic rhinitis 01/21/2015  . Leg edema 12/16/2013  . Weight loss 08/29/2012  . Lumbar canal stenosis 08/20/2012  . Medicare annual wellness visit, subsequent 11/03/2011  . Heart murmur 11/03/2011  . Dermatitis 01/19/2011  . THYROID NODULE 03/15/2010  . PERSONAL HX COLONIC POLYPS 04/30/2008  . FATIGUE 04/17/2008  . ANXIETY- Insomnia 01/27/2008  . Osteoarthritis 01/27/2008  . BAKER'S CYST 12/20/2006  . Essential hypertension 09/11/2005    PAYSEUR,ANGIE PTA 10/05/2015, 1:57 PM  [place], [place], [address] Phone: [phone]   Fax:  [phone]  Name: CHELLSEY MALONY MRN: [id number] Date of Birth: 1941/02/01

## 2015-10-07 ENCOUNTER — Ambulatory Visit: Payer: Medicare Other | Admitting: Physical Therapy

## 2015-10-07 ENCOUNTER — Encounter: Payer: Self-pay | Admitting: Physical Therapy

## 2015-10-07 DIAGNOSIS — M25552 Pain in left hip: Secondary | ICD-10-CM

## 2015-10-07 DIAGNOSIS — R262 Difficulty in walking, not elsewhere classified: Secondary | ICD-10-CM | POA: Diagnosis not present

## 2015-10-07 DIAGNOSIS — M5442 Lumbago with sciatica, left side: Secondary | ICD-10-CM

## 2015-10-07 NOTE — Therapy (Signed)
[place], [place], [address] Phone: [phone]   Fax:  [phone]  Physical Therapy Treatment  Patient Details  Name: [name] MRN: [id number] Date of Birth: September 17, 1940 Referring Provider: Letta Pate  Encounter Date: 10/07/2015      PT End of Session - 10/07/15 1603    Visit Number 6   Date for PT Re-Evaluation 11/18/15   PT Start Time 1510   PT Stop Time 1620   PT Time Calculation (min) 70 min      Past Medical History  Diagnosis Date  . History of elevated homocysteine   . Thyroid nodule     rt benign colloid nodule--left hyperplastic nodule- Thyroid bx 2006: neg  . Baker's cyst   . Goiter   . Adenomatous colon polyp 03/2003  . PONV (postoperative nausea and vomiting)   . Anxiety     ativan for sleep  . OA (osteoarthritis)      knee pain after TKR left , back pain- last steroid injection back 2 months ago Dr Nelva Bush  . Hypertension 09/11/2005  . Postlaminectomy syndrome, lumbar region     Dr. Nelva Bush, recommended doing bilateral S1 transforaminal epidural with D5W  . Lumbar radiculopathy     Dr. Nelva Bush  . Degenerative lumbar disc     Dr. Nelva Bush    Past Surgical History  Procedure Laterality Date  . Abdominal hysterectomy    . Oophorectomy    . Knee arthroscopy      L   replacement 2007, Dr Corinne Ports, then a manipulation, then redo TKR 11/09 w/ Dr.Alusio. Parcial replacement again 09-2010  . Total hip arthroplasty  3/11    Right   . Facelift    . Incisional hernia repair    . Knee surgery      x 5-  left  . Shoulder surgery      replacements: L 2010, R 2012  . Bakers cystectomy      right knee  . Total knee arthroplasty  05/20/2012    Procedure: TOTAL KNEE ARTHROPLASTY;  Surgeon: Gearlean Alf, MD;  Location: [facility];  Service: Orthopedics;  Laterality: Right;  . Back surgery  11-09-2011    at [facility], lumbar    There were no vitals filed for this visit.  Visit Diagnosis:   Bilateral low back pain with left-sided sciatica  Left hip pain      Subjective Assessment - 10/07/15 1522    Subjective felt better after last session   Currently in Pain? Yes   Pain Score 4    Pain Location Back   Pain Orientation Left                         [facility] Adult PT Treatment/Exercise - 10/07/15 0001    Lumbar Exercises: Aerobic   Stationary Bike 6 min   Tread Mill NuStep Level 5 x 6 minutes   Lumbar Exercises: Supine   Other Supine Lumbar Exercises 22 min core stab   Moist Heat Therapy   Number Minutes Moist Heat 15 Minutes   Moist Heat Location Lumbar Spine   Electrical Stimulation   Electrical Stimulation Location lumbar   Electrical Stimulation Action IFC   Electrical Stimulation Goals Pain   Iontophoresis   Type of Iontophoresis Dexamethasone   Location Left SI   Dose 1.2 mA   Time 4 hour leave on   Manual Therapy   Manual Therapy  Soft tissue mobilization;Passive ROM   Manual therapy comments much looser than last session, decreased tenderness   Soft tissue mobilization LB inot SI   Passive ROM LE and trunk                  PT Short Term Goals - 09/30/15 1524    PT SHORT TERM GOAL #1   Title independent iwth initial HEP   Status Achieved           PT Long Term Goals - 10/07/15 1547    PT LONG TERM GOAL #1   Title understand proper posture and body mechanics instruction   Status On-going   PT LONG TERM GOAL #2   Title increase lumbar ROM 25%   Status On-going   PT LONG TERM GOAL #3   Title report 25% decrease in pain   Status On-going   PT LONG TERM GOAL #4   Title increase LE strength to 4/5   Status On-going               Plan - 10/07/15 1603    Clinical Impression Statement pt looser with stretching and decreased tenderness and tightness hwever still very limited with ROM and poor body mechanics at times. temporary relief with therapy tx.   PT Next Visit Plan Core Stab, PROM and MT         Problem List Patient Active Problem List   Diagnosis Date Noted  . Lumbar post-laminectomy syndrome 10/04/2015  . Chronic lumbar radiculopathy 10/04/2015  . PCP NOTES >>>>> 08/07/2015  . Allergic rhinitis 01/21/2015  . Leg edema 12/16/2013  . Weight loss 08/29/2012  . Lumbar canal stenosis 08/20/2012  . Medicare annual wellness visit, subsequent 11/03/2011  . Heart murmur 11/03/2011  . Dermatitis 01/19/2011  . THYROID NODULE 03/15/2010  . PERSONAL HX COLONIC POLYPS 04/30/2008  . FATIGUE 04/17/2008  . ANXIETY- Insomnia 01/27/2008  . Osteoarthritis 01/27/2008  . BAKER'S CYST 12/20/2006  . Essential hypertension 09/11/2005    PAYSEUR,ANGIE PTA 10/07/2015, 4:04 PM  [place], [place], [address] Phone: [phone]   Fax:  [phone]  Name: [name] MRN: [id number] Date of Birth: [birth year]

## 2015-10-09 LAB — TOXASSURE SELECT,+ANTIDEPR,UR: PDF: 0

## 2015-10-12 ENCOUNTER — Ambulatory Visit: Payer: Medicare Other | Admitting: Physical Therapy

## 2015-10-12 ENCOUNTER — Encounter: Payer: Self-pay | Admitting: Physical Therapy

## 2015-10-12 DIAGNOSIS — M5442 Lumbago with sciatica, left side: Secondary | ICD-10-CM | POA: Diagnosis not present

## 2015-10-12 DIAGNOSIS — M25552 Pain in left hip: Secondary | ICD-10-CM

## 2015-10-12 DIAGNOSIS — R262 Difficulty in walking, not elsewhere classified: Secondary | ICD-10-CM | POA: Diagnosis not present

## 2015-10-12 NOTE — Progress Notes (Signed)
Urine drug screen for this encounter is consistent for prescribed medication 

## 2015-10-12 NOTE — Therapy (Signed)
[place], [place], [address] Phone: [phone]   Fax:  [phone]  Physical Therapy Treatment  Patient Details  Name: [name] MRN: [id number] Date of Birth: 12-Sep-1940 Referring Provider: Letta Pate  Encounter Date: 10/12/2015      PT End of Session - 10/12/15 1249    Visit Number 7   Date for PT Re-Evaluation 11/18/15   PT Start Time 1210   PT Stop Time 1315   PT Time Calculation (min) 65 min      Past Medical History  Diagnosis Date  . History of elevated homocysteine   . Thyroid nodule     rt benign colloid nodule--left hyperplastic nodule- Thyroid bx 2006: neg  . Baker's cyst   . Goiter   . Adenomatous colon polyp 03/2003  . PONV (postoperative nausea and vomiting)   . Anxiety     ativan for sleep  . OA (osteoarthritis)      knee pain after TKR left , back pain- last steroid injection back 2 months ago Dr Nelva Bush  . Hypertension 09/11/2005  . Postlaminectomy syndrome, lumbar region     Dr. Nelva Bush, recommended doing bilateral S1 transforaminal epidural with D5W  . Lumbar radiculopathy     Dr. Nelva Bush  . Degenerative lumbar disc     Dr. Nelva Bush    Past Surgical History  Procedure Laterality Date  . Abdominal hysterectomy    . Oophorectomy    . Knee arthroscopy      L   replacement 2007, Dr Corinne Ports, then a manipulation, then redo TKR 11/09 w/ Dr.Alusio. Parcial replacement again 09-2010  . Total hip arthroplasty  3/11    Right   . Facelift    . Incisional hernia repair    . Knee surgery      x 5-  left  . Shoulder surgery      replacements: L 2010, R 2012  . Bakers cystectomy      right knee  . Total knee arthroplasty  05/20/2012    Procedure: TOTAL KNEE ARTHROPLASTY;  Surgeon: Gearlean Alf, MD;  Location: [facility];  Service: Orthopedics;  Laterality: Right;  . Back surgery  11-09-2011    at [facility], lumbar    There were no vitals filed for this visit.  Visit Diagnosis:   Bilateral low back pain with left-sided sciatica  Left hip pain      Subjective Assessment - 10/12/15 1246    Subjective left LB/SI pain. " I think I need to call MD,wonder if its my hip."   Currently in Pain? Yes   Pain Score 8    Pain Location Back   Pain Orientation Left                         [facility] Adult PT Treatment/Exercise - 10/12/15 0001    Modalities   Modalities Ultrasound   Moist Heat Therapy   Number Minutes Moist Heat 15 Minutes   Moist Heat Location Lumbar Spine   Electrical Stimulation   Electrical Stimulation Location lumbar   Electrical Stimulation Action IFC   Electrical Stimulation Goals Pain   Ultrasound   Ultrasound Location Left SI   Ultrasound Parameters 3.3 mHz 1.3 w/cm 2   Ultrasound Goals Pain   Iontophoresis   Type of Iontophoresis Dexamethasone   Location Left SI   Dose 1.2 mA   Time 4 hour leave on   Manual Therapy  Manual Therapy Soft tissue mobilization   Soft tissue mobilization LB/Left SI                  PT Short Term Goals - 09/30/15 1524    PT SHORT TERM GOAL #1   Title independent iwth initial HEP   Status Achieved           PT Long Term Goals - 10/07/15 1547    PT LONG TERM GOAL #1   Title understand proper posture and body mechanics instruction   Status On-going   PT LONG TERM GOAL #2   Title increase lumbar ROM 25%   Status On-going   PT LONG TERM GOAL #3   Title report 25% decrease in pain   Status On-going   PT LONG TERM GOAL #4   Title increase LE strength to 4/5   Status On-going               Plan - 10/12/15 1249    Clinical Impression Statement pt with increased pain in Left SI,difficulty performing any ther ex d/t pain. Tolerated modalites and STW fair. Pt stated pain is depressing her.   PT Next Visit Plan Core Stab, PROM and MT        Problem List Patient Active Problem List   Diagnosis Date Noted  . Lumbar post-laminectomy syndrome 10/04/2015  . Chronic  lumbar radiculopathy 10/04/2015  . PCP NOTES >>>>> 08/07/2015  . Allergic rhinitis 01/21/2015  . Leg edema 12/16/2013  . Weight loss 08/29/2012  . Lumbar canal stenosis 08/20/2012  . Medicare annual wellness visit, subsequent 11/03/2011  . Heart murmur 11/03/2011  . Dermatitis 01/19/2011  . THYROID NODULE 03/15/2010  . PERSONAL HX COLONIC POLYPS 04/30/2008  . FATIGUE 04/17/2008  . ANXIETY- Insomnia 01/27/2008  . Osteoarthritis 01/27/2008  . BAKER'S CYST 12/20/2006  . Essential hypertension 09/11/2005    [name],[name] PTA 10/12/2015, 12:57 PM  [place], [place], [address] Phone: [phone]   Fax:  [phone]  Name: [name] MRN: [id number] Date of Birth: Jan 14, 1941

## 2015-10-14 ENCOUNTER — Encounter: Payer: Self-pay | Admitting: Physical Therapy

## 2015-10-14 ENCOUNTER — Ambulatory Visit: Payer: Medicare Other | Attending: Registered Nurse | Admitting: Physical Therapy

## 2015-10-14 DIAGNOSIS — M5442 Lumbago with sciatica, left side: Secondary | ICD-10-CM

## 2015-10-14 DIAGNOSIS — R262 Difficulty in walking, not elsewhere classified: Secondary | ICD-10-CM | POA: Diagnosis not present

## 2015-10-14 DIAGNOSIS — M25552 Pain in left hip: Secondary | ICD-10-CM

## 2015-10-14 NOTE — Therapy (Signed)
[place], [place], [address] Phone: [phone]   Fax:  [phone]  Physical Therapy Treatment  Patient Details  Name: [name] MRN: [id number] Date of Birth: 1941-06-13 Referring Provider: Letta Pate  Encounter Date: 10/14/2015      PT End of Session - 10/14/15 1512    Visit Number 8   Date for PT Re-Evaluation 11/18/15   PT Start Time 1443   PT Stop Time 1545   PT Time Calculation (min) 60 min      Past Medical History  Diagnosis Date  . History of elevated homocysteine   . Thyroid nodule     rt benign colloid nodule--left hyperplastic nodule- Thyroid bx 2006: neg  . Baker's cyst   . Goiter   . Adenomatous colon polyp 03/2003  . PONV (postoperative nausea and vomiting)   . Anxiety     ativan for sleep  . OA (osteoarthritis)      knee pain after TKR left , back pain- last steroid injection back 2 months ago Dr Nelva Bush  . Hypertension 09/11/2005  . Postlaminectomy syndrome, lumbar region     Dr. Nelva Bush, recommended doing bilateral S1 transforaminal epidural with D5W  . Lumbar radiculopathy     Dr. Nelva Bush  . Degenerative lumbar disc     Dr. Nelva Bush    Past Surgical History  Procedure Laterality Date  . Abdominal hysterectomy    . Oophorectomy    . Knee arthroscopy      L   replacement 2007, Dr Corinne Ports, then a manipulation, then redo TKR 11/09 w/ Dr.Alusio. Parcial replacement again 09-2010  . Total hip arthroplasty  3/11    Right   . Facelift    . Incisional hernia repair    . Knee surgery      x 5-  left  . Shoulder surgery      replacements: L 2010, R 2012  . Bakers cystectomy      right knee  . Total knee arthroplasty  05/20/2012    Procedure: TOTAL KNEE ARTHROPLASTY;  Surgeon: Gearlean Alf, MD;  Location: [facility];  Service: Orthopedics;  Laterality: Right;  . Back surgery  11-09-2011    at [facility], lumbar    There were no vitals filed for this visit.  Visit Diagnosis:   Bilateral low back pain with left-sided sciatica  Left hip pain  Difficulty walking      Subjective Assessment - 10/14/15 1449    Subjective ' i think i need a new hip on the left" MD 11/03/15   Currently in Pain? Yes   Pain Score 7    Pain Location Hip   Pain Orientation Left;Posterior                         [facility] Adult PT Treatment/Exercise - 10/14/15 0001    Lumbar Exercises: Aerobic   Stationary Bike 6 min   Lumbar Exercises: Supine   Other Supine Lumbar Exercises Core Stab 20 min   Electrical Stimulation   Electrical Stimulation Location lumbar   Electrical Stimulation Action IFC   Electrical Stimulation Goals Pain   Iontophoresis   Type of Iontophoresis Dexamethasone   Location Left SI   Dose 1.2 mA   Time 4 hour leave on   Manual Therapy   Manual Therapy Soft tissue mobilization;Manual Traction;Passive ROM;Joint mobilization   Soft tissue mobilization LB/left SI   Myofascial Release sacral release  Passive ROM LE and trunk   Manual Traction left LE traction                  PT Short Term Goals - 09/30/15 1524    PT SHORT TERM GOAL #1   Title independent iwth initial HEP   Status Achieved           PT Long Term Goals - 10/14/15 1514    PT LONG TERM GOAL #1   Title understand proper posture and body mechanics instruction   Status Partially Met   PT LONG TERM GOAL #2   Title increase lumbar ROM 25%   Status On-going   PT LONG TERM GOAL #3   Title report 25% decrease in pain   Status On-going               Plan - 10/14/15 1512    Clinical Impression Statement pt needs cuing with ther ex to breath and correct BM. Pt SI appears to be misaligned but hard to tell d/t ROM issues in knees, pt did respond well to LE distraction.   PT Next Visit Plan Core Stab, PROM and MT        Problem List Patient Active Problem List   Diagnosis Date Noted  . Lumbar post-laminectomy syndrome 10/04/2015  . Chronic lumbar  radiculopathy 10/04/2015  . PCP NOTES >>>>> 08/07/2015  . Allergic rhinitis 01/21/2015  . Leg edema 12/16/2013  . Weight loss 08/29/2012  . Lumbar canal stenosis 08/20/2012  . Medicare annual wellness visit, subsequent 11/03/2011  . Heart murmur 11/03/2011  . Dermatitis 01/19/2011  . THYROID NODULE 03/15/2010  . PERSONAL HX COLONIC POLYPS 04/30/2008  . FATIGUE 04/17/2008  . ANXIETY- Insomnia 01/27/2008  . Osteoarthritis 01/27/2008  . BAKER'S CYST 12/20/2006  . Essential hypertension 09/11/2005    Jeron Grahn,ANGIE PTA 10/14/2015, 3:16 PM  [place], [place], [address] Phone: [phone]   Fax:  [phone]  Name: [name] MRN: [id number] Date of Birth: [birth year]

## 2015-10-19 ENCOUNTER — Encounter: Payer: Self-pay | Admitting: Physical Therapy

## 2015-10-19 ENCOUNTER — Ambulatory Visit: Payer: Medicare Other | Admitting: Physical Therapy

## 2015-10-19 DIAGNOSIS — M5442 Lumbago with sciatica, left side: Secondary | ICD-10-CM | POA: Diagnosis not present

## 2015-10-19 DIAGNOSIS — M25552 Pain in left hip: Secondary | ICD-10-CM

## 2015-10-19 DIAGNOSIS — R262 Difficulty in walking, not elsewhere classified: Secondary | ICD-10-CM | POA: Diagnosis not present

## 2015-10-19 NOTE — Therapy (Signed)
[place], [place], [address] Phone: [phone]   Fax:  [phone]  Physical Therapy Treatment  Patient Details  Name: [name] MRN: [id number] Date of Birth: 06-14-1941 Referring Provider: Letta Pate  Encounter Date: 10/19/2015      PT End of Session - 10/19/15 1444    Visit Number 9   Date for PT Re-Evaluation 11/18/15   PT Start Time 1400   PT Stop Time 1510   PT Time Calculation (min) 70 min      Past Medical History  Diagnosis Date  . History of elevated homocysteine   . Thyroid nodule     rt benign colloid nodule--left hyperplastic nodule- Thyroid bx 2006: neg  . Baker's cyst   . Goiter   . Adenomatous colon polyp 03/2003  . PONV (postoperative nausea and vomiting)   . Anxiety     ativan for sleep  . OA (osteoarthritis)      knee pain after TKR left , back pain- last steroid injection back 2 months ago Dr Nelva Bush  . Hypertension 09/11/2005  . Postlaminectomy syndrome, lumbar region     Dr. Nelva Bush, recommended doing bilateral S1 transforaminal epidural with D5W  . Lumbar radiculopathy     Dr. Nelva Bush  . Degenerative lumbar disc     Dr. Nelva Bush    Past Surgical History  Procedure Laterality Date  . Abdominal hysterectomy    . Oophorectomy    . Knee arthroscopy      L   replacement 2007, Dr Corinne Ports, then a manipulation, then redo TKR 11/09 w/ Dr.Alusio. Parcial replacement again 09-2010  . Total hip arthroplasty  3/11    Right   . Facelift    . Incisional hernia repair    . Knee surgery      x 5-  left  . Shoulder surgery      replacements: L 2010, R 2012  . Bakers cystectomy      right knee  . Total knee arthroplasty  05/20/2012    Procedure: TOTAL KNEE ARTHROPLASTY;  Surgeon: Gearlean Alf, MD;  Location: [facility];  Service: Orthopedics;  Laterality: Right;  . Back surgery  11-09-2011    at [facility], lumbar    There were no vitals filed for this visit.  Visit Diagnosis:   Bilateral low back pain with left-sided sciatica  Left hip pain      Subjective Assessment - 10/19/15 1416    Subjective better last couple days. heel lift helps alot   Currently in Pain? Yes   Pain Score 5    Pain Location Hip   Pain Orientation Left                         [facility] Adult PT Treatment/Exercise - 10/19/15 0001    Lumbar Exercises: Aerobic   Stationary Bike 6 min   UBE (Upper Arm Bike) Level 3 x 3 minutes   Lumbar Exercises: Seated   Sit to Stand Limitations ball add squeeze   Lumbar Exercises: Supine   Large Ball Abdominal Isometric 15 reps   Other Supine Lumbar Exercises Core Stab 20 min   Moist Heat Therapy   Number Minutes Moist Heat 15 Minutes   Moist Heat Location Lumbar Spine   Electrical Stimulation   Electrical Stimulation Location lumbar   Electrical Stimulation Action IFC   Electrical Stimulation Goals Pain   Iontophoresis   Type of Iontophoresis  Dexamethasone   Location Left SI   Dose 1.2 mA   Time 4 hour leave on   Manual Therapy   Manual Therapy Soft tissue mobilization;Passive ROM;Manual Traction;Neural Stretch   Manual therapy comments very tender Left SI and lateral   Soft tissue mobilization LB/left SI   Passive ROM LE and trunk   Manual Traction LE   Neural Stretch LE                  PT Short Term Goals - 09/30/15 1524    PT SHORT TERM GOAL #1   Title independent iwth initial HEP   Status Achieved           PT Long Term Goals - 10/14/15 1514    PT LONG TERM GOAL #1   Title understand proper posture and body mechanics instruction   Status Partially Met   PT LONG TERM GOAL #2   Title increase lumbar ROM 25%   Status On-going   PT LONG TERM GOAL #3   Title report 25% decrease in pain   Status On-going               Plan - 10/19/15 1444    Clinical Impression Statement walking better adn some decrease in pain since using heel lift. Increased LE ROM with decreased tightness,very tender  Left SI and laterally   PT Next Visit Plan Core Stab, PROM and MT        Problem List Patient Active Problem List   Diagnosis Date Noted  . Lumbar post-laminectomy syndrome 10/04/2015  . Chronic lumbar radiculopathy 10/04/2015  . PCP NOTES >>>>> 08/07/2015  . Allergic rhinitis 01/21/2015  . Leg edema 12/16/2013  . Weight loss 08/29/2012  . Lumbar canal stenosis 08/20/2012  . Medicare annual wellness visit, subsequent 11/03/2011  . Heart murmur 11/03/2011  . Dermatitis 01/19/2011  . THYROID NODULE 03/15/2010  . PERSONAL HX COLONIC POLYPS 04/30/2008  . FATIGUE 04/17/2008  . ANXIETY- Insomnia 01/27/2008  . Osteoarthritis 01/27/2008  . BAKER'S CYST 12/20/2006  . Essential hypertension 09/11/2005    [facility]  PTA [place], [place], [address] Phone: [phone]   Fax:  [phone]  Name: [name] MRN: [id number] Date of Birth: May 25, 1941

## 2015-10-21 ENCOUNTER — Ambulatory Visit: Payer: Medicare Other | Admitting: Physical Therapy

## 2015-10-21 ENCOUNTER — Encounter: Payer: Self-pay | Admitting: Physical Therapy

## 2015-10-21 DIAGNOSIS — M5442 Lumbago with sciatica, left side: Secondary | ICD-10-CM

## 2015-10-21 DIAGNOSIS — M25552 Pain in left hip: Secondary | ICD-10-CM | POA: Diagnosis not present

## 2015-10-21 DIAGNOSIS — R262 Difficulty in walking, not elsewhere classified: Secondary | ICD-10-CM | POA: Diagnosis not present

## 2015-10-21 NOTE — Therapy (Signed)
[name], [place], [address] Phone: [phone]   Fax:  [phone]  Physical Therapy Treatment  Patient Details  Name: [name] MRN: [id number] Date of Birth: 04/24/1941 Referring Provider: Letta Pate  Encounter Date: 10/21/2015      PT End of Session - 10/21/15 1453    Visit Number 10   Date for PT Re-Evaluation 11/18/15   PT Start Time 1400   PT Stop Time 1500   PT Time Calculation (min) 60 min      Past Medical History  Diagnosis Date  . History of elevated homocysteine   . Thyroid nodule     rt benign colloid nodule--left hyperplastic nodule- Thyroid bx 2006: neg  . Baker's cyst   . Goiter   . Adenomatous colon polyp 03/2003  . PONV (postoperative nausea and vomiting)   . Anxiety     ativan for sleep  . OA (osteoarthritis)      knee pain after TKR left , back pain- last steroid injection back 2 months ago Dr Nelva Bush  . Hypertension 09/11/2005  . Postlaminectomy syndrome, lumbar region     Dr. Nelva Bush, recommended doing bilateral S1 transforaminal epidural with D5W  . Lumbar radiculopathy     Dr. Nelva Bush  . Degenerative lumbar disc     Dr. Nelva Bush    Past Surgical History  Procedure Laterality Date  . Abdominal hysterectomy    . Oophorectomy    . Knee arthroscopy      L   replacement 2007, Dr Corinne Ports, then a manipulation, then redo TKR 11/09 w/ Dr.Alusio. Parcial replacement again 09-2010  . Total hip arthroplasty  3/11    Right   . Facelift    . Incisional hernia repair    . Knee surgery      x 5-  left  . Shoulder surgery      replacements: L 2010, R 2012  . Bakers cystectomy      right knee  . Total knee arthroplasty  05/20/2012    Procedure: TOTAL KNEE ARTHROPLASTY;  Surgeon: Gearlean Alf, MD;  Location: [facility];  Service: Orthopedics;  Laterality: Right;  . Back surgery  11-09-2011    at [facility], lumbar    There were no vitals filed for this visit.  Visit Diagnosis:   Bilateral low back pain with left-sided sciatica  Left hip pain      Subjective Assessment - 10/21/15 1408    Subjective knees really killed me after last session   Currently in Pain? Yes   Pain Score 5    Pain Location Hip                         [facility] Adult PT Treatment/Exercise - 10/21/15 0001    Lumbar Exercises: Aerobic   Stationary Bike 6 min   Tread Mill NuStep Level 5 x 6 minutes   UBE (Upper Arm Bike) Level 3 x 3 minutes   Lumbar Exercises: Machines for Strengthening   Other Lumbar Machine Exercise lats 20# 2x10   Other Lumbar Machine Exercise seated row 20# 2 sets 10   Lumbar Exercises: Standing   Other Standing Lumbar Exercises standing obl 3 way   Lumbar Exercises: Supine   Other Supine Lumbar Exercises Core stab 10 min   Moist Heat Therapy   Number Minutes Moist Heat 15 Minutes   Moist Heat Location Lumbar Spine   Electrical Stimulation  Electrical Stimulation Location lumbar   Electrical Stimulation Action IFC   Iontophoresis   Type of Iontophoresis Dexamethasone   Location Left SI   Dose 1.2 mA   Time 4 hour leave on   Manual Therapy   Manual Therapy Passive ROM   Passive ROM LE and trunk                  PT Short Term Goals - 09/30/15 1524    PT SHORT TERM GOAL #1   Title independent iwth initial HEP   Status Achieved           PT Long Term Goals - 10/21/15 1458    PT LONG TERM GOAL #1   Title understand proper posture and body mechanics instruction   Status On-going   PT LONG TERM GOAL #2   Title increase lumbar ROM 25%   Status Achieved   PT LONG TERM GOAL #3   Title report 25% decrease in pain   PT LONG TERM GOAL #4   Title increase LE strength to 4/5   Status On-going               Plan - 10/21/15 1453    Clinical Impression Statement pt reports increased knee pain so focused core stab and UE strength with less leg involvement to see if helps. pt always feels and moves better after PT  interventions. Slow progression with goals.   PT Next Visit Plan Core Stab, PROM and MT        Problem List Patient Active Problem List   Diagnosis Date Noted  . Lumbar post-laminectomy syndrome 10/04/2015  . Chronic lumbar radiculopathy 10/04/2015  . PCP NOTES >>>>> 08/07/2015  . Allergic rhinitis 01/21/2015  . Leg edema 12/16/2013  . Weight loss 08/29/2012  . Lumbar canal stenosis 08/20/2012  . Medicare annual wellness visit, subsequent 11/03/2011  . Heart murmur 11/03/2011  . Dermatitis 01/19/2011  . THYROID NODULE 03/15/2010  . PERSONAL HX COLONIC POLYPS 04/30/2008  . FATIGUE 04/17/2008  . ANXIETY- Insomnia 01/27/2008  . Osteoarthritis 01/27/2008  . BAKER'S CYST 12/20/2006  . Essential hypertension 09/11/2005    PAYSEUR,ANGIE PTA 10/21/2015, 3:00 PM  [place], [place], [address] Phone: [phone]   Fax:  [phone]  Name: [name] MRN: [id number] Date of Birth: 1941/04/13

## 2015-10-26 ENCOUNTER — Encounter: Payer: Self-pay | Admitting: Physical Therapy

## 2015-10-26 ENCOUNTER — Ambulatory Visit: Payer: Medicare Other | Admitting: Physical Therapy

## 2015-10-26 DIAGNOSIS — M5442 Lumbago with sciatica, left side: Secondary | ICD-10-CM | POA: Diagnosis not present

## 2015-10-26 DIAGNOSIS — R262 Difficulty in walking, not elsewhere classified: Secondary | ICD-10-CM

## 2015-10-26 DIAGNOSIS — M25552 Pain in left hip: Secondary | ICD-10-CM | POA: Diagnosis not present

## 2015-10-26 NOTE — Therapy (Signed)
[place], [place], [address] Phone: [phone]   Fax:  [phone]  Physical Therapy Treatment  Patient Details  Name: [name] MRN: [id number] Date of Birth: 05/06/1941 Referring Provider: Letta Pate  Encounter Date: 10/26/2015      PT End of Session - 10/26/15 1443    Visit Number 11   Date for PT Re-Evaluation 11/18/15   PT Start Time 1400   PT Stop Time 1500   PT Time Calculation (min) 60 min      Past Medical History  Diagnosis Date  . History of elevated homocysteine   . Thyroid nodule     rt benign colloid nodule--left hyperplastic nodule- Thyroid bx 2006: neg  . Baker's cyst   . Goiter   . Adenomatous colon polyp 03/2003  . PONV (postoperative nausea and vomiting)   . Anxiety     ativan for sleep  . OA (osteoarthritis)      knee pain after TKR left , back pain- last steroid injection back 2 months ago Dr Nelva Bush  . Hypertension 09/11/2005  . Postlaminectomy syndrome, lumbar region     Dr. Nelva Bush, recommended doing bilateral S1 transforaminal epidural with D5W  . Lumbar radiculopathy     Dr. Nelva Bush  . Degenerative lumbar disc     Dr. Nelva Bush    Past Surgical History  Procedure Laterality Date  . Abdominal hysterectomy    . Oophorectomy    . Knee arthroscopy      L   replacement 2007, Dr Corinne Ports, then a manipulation, then redo TKR 11/09 w/ Dr.Alusio. Parcial replacement again 09-2010  . Total hip arthroplasty  3/11    Right   . Facelift    . Incisional hernia repair    . Knee surgery      x 5-  left  . Shoulder surgery      replacements: L 2010, R 2012  . Bakers cystectomy      right knee  . Total knee arthroplasty  05/20/2012    Procedure: TOTAL KNEE ARTHROPLASTY;  Surgeon: Gearlean Alf, MD;  Location: [facility];  Service: Orthopedics;  Laterality: Right;  . Back surgery  11-09-2011    at [facility], lumbar    There were no vitals filed for this visit.  Visit Diagnosis:   Bilateral low back pain with left-sided sciatica  Difficulty walking      Subjective Assessment - 10/26/15 1405    Subjective had company this weekend and overdid, cried myself to sleep I hurt so bad.   Currently in Pain? Yes   Pain Score 8    Pain Location Back   Pain Orientation Left                         [facility] Adult PT Treatment/Exercise - 10/26/15 0001    Lumbar Exercises: Machines for Strengthening   Other Lumbar Machine Exercise lats 20# 2x10   Other Lumbar Machine Exercise seated row 20# 2 sets 10   Lumbar Exercises: Seated   Sit to Stand Limitations sit fit pelvic ROM and stab ex   Modalities   Modalities Traction   Moist Heat Therapy   Number Minutes Moist Heat 15 Minutes   Moist Heat Location Lumbar Spine   Traction   Type of Traction Lumbar   Min (lbs) 40   Time 12   Manual Therapy   Manual Therapy Passive ROM;Soft tissue mobilization  Passive ROM LE and trunk                  PT Short Term Goals - 09/30/15 1524    PT SHORT TERM GOAL #1   Title independent iwth initial HEP   Status Achieved           PT Long Term Goals - 10/21/15 1458    PT LONG TERM GOAL #1   Title understand proper posture and body mechanics instruction   Status On-going   PT LONG TERM GOAL #2   Title increase lumbar ROM 25%   Status Achieved   PT LONG TERM GOAL #3   Title report 25% decrease in pain   PT LONG TERM GOAL #4   Title increase LE strength to 4/5   Status On-going               Plan - 10/26/15 1443    Clinical Impression Statement pt struggling with increased pain and well as increased neuropathy, pt verb depressed but keeps going in fear of getting paralized.After discusssion with PT decided to try light static traction.   PT Next Visit Plan Core Stab, PROM and MT        Problem List Patient Active Problem List   Diagnosis Date Noted  . Lumbar post-laminectomy syndrome 10/04/2015  . Chronic lumbar radiculopathy  10/04/2015  . PCP NOTES >>>>> 08/07/2015  . Allergic rhinitis 01/21/2015  . Leg edema 12/16/2013  . Weight loss 08/29/2012  . Lumbar canal stenosis 08/20/2012  . Medicare annual wellness visit, subsequent 11/03/2011  . Heart murmur 11/03/2011  . Dermatitis 01/19/2011  . THYROID NODULE 03/15/2010  . PERSONAL HX COLONIC POLYPS 04/30/2008  . FATIGUE 04/17/2008  . ANXIETY- Insomnia 01/27/2008  . Osteoarthritis 01/27/2008  . BAKER'S CYST 12/20/2006  . Essential hypertension 09/11/2005    PAYSEUR,ANGIE PTA 10/26/2015, 2:46 PM  [name], [place], [address] Phone: [phone]   Fax:  [phone]  Name: CASSANDRIA LUBICH MRN: [id number] Date of Birth: May 02, 1941

## 2015-10-28 ENCOUNTER — Encounter: Payer: Self-pay | Admitting: Physical Therapy

## 2015-10-28 ENCOUNTER — Ambulatory Visit: Payer: Medicare Other | Admitting: Physical Therapy

## 2015-10-28 DIAGNOSIS — R262 Difficulty in walking, not elsewhere classified: Secondary | ICD-10-CM

## 2015-10-28 DIAGNOSIS — M5442 Lumbago with sciatica, left side: Secondary | ICD-10-CM

## 2015-10-28 DIAGNOSIS — M25552 Pain in left hip: Secondary | ICD-10-CM | POA: Diagnosis not present

## 2015-10-28 NOTE — Therapy (Signed)
[place], [place], [address] Phone: [phone]   Fax:  [phone]  Physical Therapy Treatment  Patient Details  Name: [name] MRN: [id number] Date of Birth: Dec 18, 1940 Referring Provider: Letta Pate  Encounter Date: 10/28/2015      PT End of Session - 10/28/15 1520    Visit Number 12   Date for PT Re-Evaluation 11/18/15   PT Start Time 1450   PT Stop Time 1535   PT Time Calculation (min) 45 min      Past Medical History  Diagnosis Date  . History of elevated homocysteine   . Thyroid nodule     rt benign colloid nodule--left hyperplastic nodule- Thyroid bx 2006: neg  . Baker's cyst   . Goiter   . Adenomatous colon polyp 03/2003  . PONV (postoperative nausea and vomiting)   . Anxiety     ativan for sleep  . OA (osteoarthritis)      knee pain after TKR left , back pain- last steroid injection back 2 months ago Dr Nelva Bush  . Hypertension 09/11/2005  . Postlaminectomy syndrome, lumbar region     Dr. Nelva Bush, recommended doing bilateral S1 transforaminal epidural with D5W  . Lumbar radiculopathy     Dr. Nelva Bush  . Degenerative lumbar disc     Dr. Nelva Bush    Past Surgical History  Procedure Laterality Date  . Abdominal hysterectomy    . Oophorectomy    . Knee arthroscopy      L   replacement 2007, Dr Corinne Ports, then a manipulation, then redo TKR 11/09 w/ Dr.Alusio. Parcial replacement again 09-2010  . Total hip arthroplasty  3/11    Right   . Facelift    . Incisional hernia repair    . Knee surgery      x 5-  left  . Shoulder surgery      replacements: L 2010, R 2012  . Bakers cystectomy      right knee  . Total knee arthroplasty  05/20/2012    Procedure: TOTAL KNEE ARTHROPLASTY;  Surgeon: Gearlean Alf, MD;  Location: [facility];  Service: Orthopedics;  Laterality: Right;  . Back surgery  11-09-2011    at [facility], lumbar    There were no vitals filed for this visit.  Visit Diagnosis:   Bilateral low back pain with left-sided sciatica  Difficulty walking      Subjective Assessment - 10/28/15 1516    Subjective felt alot better after stretching and traction   Currently in Pain? Yes   Pain Score 4    Pain Location Back            [facility] PT Assessment - 10/28/15 0001    AROM   Overall AROM Comments ROM decreased 50% and ext 75%   Strength   Overall Strength Comments hips 4-/5                     [facility] Adult PT Treatment/Exercise - 10/28/15 0001    Moist Heat Therapy   Number Minutes Moist Heat 15 Minutes   Moist Heat Location Lumbar Spine   Traction   Type of Traction Lumbar   Min (lbs) 45   Time 15  static   Manual Therapy   Manual Therapy Passive ROM  LE and trunk                PT Education - 10/28/15 1518    Education  provided Yes   Education Details pt educated on decreasing "erradic mvmt" ie dancing and hip shakes to decrease stress/strain   Person(s) Educated Patient   Methods Explanation;Demonstration   Comprehension Verbalized understanding          PT Short Term Goals - 09/30/15 1524    PT SHORT TERM GOAL #1   Title independent iwth initial HEP   Status Achieved           PT Long Term Goals - 10/28/15 1518    PT LONG TERM GOAL #1   Title understand proper posture and body mechanics instruction   Status Achieved   PT LONG TERM GOAL #2   Title increase lumbar ROM 25%   Status On-going   PT LONG TERM GOAL #3   Title report 25% decrease in pain   Status On-going   PT LONG TERM GOAL #4   Title increase LE strength to 4/5               Plan - 10/28/15 1520    Clinical Impression Statement pt with good relief with manual stretching and traction, pt stated she ordered a "stretcher" asked pt to bring in prior to use so she doesn't hurt herself   PT Next Visit Plan Core Stab,MT,PROM,traction        Problem List Patient Active Problem List   Diagnosis Date Noted  . Lumbar post-laminectomy  syndrome 10/04/2015  . Chronic lumbar radiculopathy 10/04/2015  . PCP NOTES >>>>> 08/07/2015  . Allergic rhinitis 01/21/2015  . Leg edema 12/16/2013  . Weight loss 08/29/2012  . Lumbar canal stenosis 08/20/2012  . Medicare annual wellness visit, subsequent 11/03/2011  . Heart murmur 11/03/2011  . Dermatitis 01/19/2011  . THYROID NODULE 03/15/2010  . PERSONAL HX COLONIC POLYPS 04/30/2008  . FATIGUE 04/17/2008  . ANXIETY- Insomnia 01/27/2008  . Osteoarthritis 01/27/2008  . BAKER'S CYST 12/20/2006  . Essential hypertension 09/11/2005    PAYSEUR,ANGIE PTA 10/28/2015, 3:22 PM  [place], [place], [address] Phone: [phone]   Fax:  [phone]  Name: [name] MRN: [id number] Date of Birth: 02/20/1941

## 2015-11-02 ENCOUNTER — Ambulatory Visit: Payer: Medicare Other | Admitting: Physical Therapy

## 2015-11-02 ENCOUNTER — Encounter: Payer: Self-pay | Admitting: Physical Therapy

## 2015-11-02 DIAGNOSIS — R262 Difficulty in walking, not elsewhere classified: Secondary | ICD-10-CM

## 2015-11-02 DIAGNOSIS — M5442 Lumbago with sciatica, left side: Secondary | ICD-10-CM | POA: Diagnosis not present

## 2015-11-02 DIAGNOSIS — M25552 Pain in left hip: Secondary | ICD-10-CM | POA: Diagnosis not present

## 2015-11-02 NOTE — Therapy (Signed)
[place], [place], [address] Phone: [phone]   Fax:  [phone]  Physical Therapy Treatment  Patient Details  Name: [name] MRN: [id number] Date of Birth: [birth year] Referring Provider: Letta Pate  Encounter Date: 11/02/2015      PT End of Session - 11/02/15 1413    Visit Number 13   Date for PT Re-Evaluation 11/18/15   PT Start Time 1350   PT Stop Time 1430   PT Time Calculation (min) 40 min      Past Medical History  Diagnosis Date  . History of elevated homocysteine   . Thyroid nodule     rt benign colloid nodule--left hyperplastic nodule- Thyroid bx 2006: neg  . Baker's cyst   . Goiter   . Adenomatous colon polyp 03/2003  . PONV (postoperative nausea and vomiting)   . Anxiety     ativan for sleep  . OA (osteoarthritis)      knee pain after TKR left , back pain- last steroid injection back 2 months ago Dr Nelva Bush  . Hypertension 09/11/2005  . Postlaminectomy syndrome, lumbar region     Dr. Nelva Bush, recommended doing bilateral S1 transforaminal epidural with D5W  . Lumbar radiculopathy     Dr. Nelva Bush  . Degenerative lumbar disc     Dr. Nelva Bush    Past Surgical History  Procedure Laterality Date  . Abdominal hysterectomy    . Oophorectomy    . Knee arthroscopy      L   replacement 2007, Dr Corinne Ports, then a manipulation, then redo TKR 11/09 w/ Dr.Alusio. Parcial replacement again 09-2010  . Total hip arthroplasty  3/11    Right   . Facelift    . Incisional hernia repair    . Knee surgery      x 5-  left  . Shoulder surgery      replacements: L 2010, R 2012  . Bakers cystectomy      right knee  . Total knee arthroplasty  05/20/2012    Procedure: TOTAL KNEE ARTHROPLASTY;  Surgeon: Gearlean Alf, MD;  Location: [facility];  Service: Orthopedics;  Laterality: Right;  . Back surgery  11-09-2011    at [facility], lumbar    There were no vitals filed for this visit.  Visit Diagnosis:   Bilateral low back pain with left-sided sciatica  Difficulty walking  Left hip pain      Subjective Assessment - 11/02/15 1409    Subjective stretching really helps, but I am over doing at home. See MD re: hip tomorrow   Currently in Pain? Yes   Pain Score 5    Pain Location Back                         [facility] Adult PT Treatment/Exercise - 11/02/15 0001    Lumbar Exercises: Aerobic   Tread Mill NuStep Level 5 x 6 minutes   Moist Heat Therapy   Number Minutes Moist Heat 15 Minutes   Manual Therapy   Manual Therapy Passive ROM;Soft tissue mobilization   Soft tissue mobilization hip IT and hip flex   Passive ROM LE and trunk                  PT Short Term Goals - 09/30/15 1524    PT SHORT TERM GOAL #1   Title independent iwth initial HEP   Status Achieved  PT Long Term Goals - 10/28/15 1518    PT LONG TERM GOAL #1   Title understand proper posture and body mechanics instruction   Status Achieved   PT LONG TERM GOAL #2   Title increase lumbar ROM 25%   Status On-going   PT LONG TERM GOAL #3   Title report 25% decrease in pain   Status On-going   PT LONG TERM GOAL #4   Title increase LE strength to 4/5               Plan - 11/02/15 1414    Clinical Impression Statement increased tightness in LE and trunk,cuing to relax. much relief from STW and PROM. hip flexors very tight,pt amb with fwd flex and knee flex on RT   PT Next Visit Plan assess and progress        Problem List Patient Active Problem List   Diagnosis Date Noted  . Lumbar post-laminectomy syndrome 10/04/2015  . Chronic lumbar radiculopathy 10/04/2015  . PCP NOTES >>>>> 08/07/2015  . Allergic rhinitis 01/21/2015  . Leg edema 12/16/2013  . Weight loss 08/29/2012  . Lumbar canal stenosis 08/20/2012  . Medicare annual wellness visit, subsequent 11/03/2011  . Heart murmur 11/03/2011  . Dermatitis 01/19/2011  . THYROID NODULE 03/15/2010  . PERSONAL HX  COLONIC POLYPS 04/30/2008  . FATIGUE 04/17/2008  . ANXIETY- Insomnia 01/27/2008  . Osteoarthritis 01/27/2008  . BAKER'S CYST 12/20/2006  . Essential hypertension 09/11/2005    Cleopha Indelicato,ANGIE PTA 11/02/2015, 2:16 PM  [place], [place], [address] Phone: [phone]   Fax:  [phone]  Name: ABEL SAENGER MRN: [id number] Date of Birth: 1941/07/22

## 2015-11-03 DIAGNOSIS — M7062 Trochanteric bursitis, left hip: Secondary | ICD-10-CM | POA: Diagnosis not present

## 2015-11-04 ENCOUNTER — Ambulatory Visit: Payer: Medicare Other | Admitting: Physical Therapy

## 2015-11-08 DIAGNOSIS — M961 Postlaminectomy syndrome, not elsewhere classified: Secondary | ICD-10-CM | POA: Diagnosis not present

## 2015-11-08 DIAGNOSIS — M5136 Other intervertebral disc degeneration, lumbar region: Secondary | ICD-10-CM | POA: Diagnosis not present

## 2015-11-08 DIAGNOSIS — M5416 Radiculopathy, lumbar region: Secondary | ICD-10-CM | POA: Diagnosis not present

## 2015-11-08 DIAGNOSIS — G894 Chronic pain syndrome: Secondary | ICD-10-CM | POA: Diagnosis not present

## 2015-11-09 ENCOUNTER — Ambulatory Visit: Payer: Medicare Other | Admitting: Physical Therapy

## 2015-11-09 ENCOUNTER — Encounter: Payer: Self-pay | Admitting: Physical Therapy

## 2015-11-09 DIAGNOSIS — M25552 Pain in left hip: Secondary | ICD-10-CM | POA: Diagnosis not present

## 2015-11-09 DIAGNOSIS — R262 Difficulty in walking, not elsewhere classified: Secondary | ICD-10-CM | POA: Diagnosis not present

## 2015-11-09 DIAGNOSIS — M5442 Lumbago with sciatica, left side: Secondary | ICD-10-CM

## 2015-11-09 NOTE — Therapy (Signed)
[name], [place], [address] Phone: [phone]   Fax:  [phone]  Physical Therapy Treatment  Patient Details  Name: [name] MRN: [id number] Date of Birth: 1941/02/17 Referring Provider: Letta Pate  Encounter Date: 11/09/2015      PT End of Session - 11/09/15 1419    Visit Number 14   Date for PT Re-Evaluation 11/18/15   PT Start Time 1400   PT Stop Time 1450   PT Time Calculation (min) 50 min      Past Medical History  Diagnosis Date  . History of elevated homocysteine   . Thyroid nodule     rt benign colloid nodule--left hyperplastic nodule- Thyroid bx 2006: neg  . Baker's cyst   . Goiter   . Adenomatous colon polyp 03/2003  . PONV (postoperative nausea and vomiting)   . Anxiety     ativan for sleep  . OA (osteoarthritis)      knee pain after TKR left , back pain- last steroid injection back 2 months ago Dr Nelva Bush  . Hypertension 09/11/2005  . Postlaminectomy syndrome, lumbar region     Dr. Nelva Bush, recommended doing bilateral S1 transforaminal epidural with D5W  . Lumbar radiculopathy     Dr. Nelva Bush  . Degenerative lumbar disc     Dr. Nelva Bush    Past Surgical History  Procedure Laterality Date  . Abdominal hysterectomy    . Oophorectomy    . Knee arthroscopy      L   replacement 2007, Dr Corinne Ports, then a manipulation, then redo TKR 11/09 w/ Dr.Alusio. Parcial replacement again 09-2010  . Total hip arthroplasty  3/11    Right   . Facelift    . Incisional hernia repair    . Knee surgery      x 5-  left  . Shoulder surgery      replacements: L 2010, R 2012  . Bakers cystectomy      right knee  . Total knee arthroplasty  05/20/2012    Procedure: TOTAL KNEE ARTHROPLASTY;  Surgeon: Gearlean Alf, MD;  Location: [facility];  Service: Orthopedics;  Laterality: Right;  . Back surgery  11-09-2011    at [facility], lumbar    There were no vitals filed for this visit.  Visit Diagnosis:   Bilateral low back pain with left-sided sciatica  Left hip pain      Subjective Assessment - 11/09/15 1357    Subjective new script from MD for hip bursitis. Dry needling Monday.   Currently in Pain? Yes   Pain Score 5    Pain Location Hip   Pain Orientation Left                         [facility] Adult PT Treatment/Exercise - 11/09/15 0001    Moist Heat Therapy   Number Minutes Moist Heat 15 Minutes   Moist Heat Location Lumbar Spine   Ultrasound   Ultrasound Location left GT   Ultrasound Parameters 8 min   Iontophoresis   Type of Iontophoresis Dexamethasone   Location left GT   Dose 1.2 mA   Time 4 hour leave on   Manual Therapy   Manual Therapy Passive ROM;Soft tissue mobilization   Soft tissue mobilization hip IT and hip flex   Passive ROM LE and trunk  PT Short Term Goals - 09/30/15 1524    PT SHORT TERM GOAL #1   Title independent iwth initial HEP   Status Achieved           PT Long Term Goals - 10/28/15 1518    PT LONG TERM GOAL #1   Title understand proper posture and body mechanics instruction   Status Achieved   PT LONG TERM GOAL #2   Title increase lumbar ROM 25%   Status On-going   PT LONG TERM GOAL #3   Title report 25% decrease in pain   Status On-going   PT LONG TERM GOAL #4   Title increase LE strength to 4/5               Plan - 11/09/15 1419    Clinical Impression Statement pt came ealry and did alot ther ex independantlyfor knee ROM, per conversation with this PTA and PT added [place] and ionto to left hip but hold to eval until after trial of dry needling to see if can help pain throughout back/hips and legs   PT Next Visit Plan assess [place] and ionto.        Problem List Patient Active Problem List   Diagnosis Date Noted  . Lumbar post-laminectomy syndrome 10/04/2015  . Chronic lumbar radiculopathy 10/04/2015  . PCP NOTES >>>>> 08/07/2015  . Allergic rhinitis 01/21/2015  . Leg edema  12/16/2013  . Weight loss 08/29/2012  . Lumbar canal stenosis 08/20/2012  . Medicare annual wellness visit, subsequent 11/03/2011  . Heart murmur 11/03/2011  . Dermatitis 01/19/2011  . THYROID NODULE 03/15/2010  . PERSONAL HX COLONIC POLYPS 04/30/2008  . FATIGUE 04/17/2008  . ANXIETY- Insomnia 01/27/2008  . Osteoarthritis 01/27/2008  . BAKER'S CYST 12/20/2006  . Essential hypertension 09/11/2005    PAYSEUR,ANGIE PTA 11/09/2015, 2:22 PM  [place], [place], [address] Phone: [phone]   Fax:  [phone]  Name: [name] MRN: [id number] Date of Birth: 1941-01-27

## 2015-11-10 ENCOUNTER — Encounter: Payer: Medicare Other | Admitting: Physical Therapy

## 2015-11-11 ENCOUNTER — Encounter: Payer: Self-pay | Admitting: Physical Therapy

## 2015-11-11 ENCOUNTER — Ambulatory Visit: Payer: Medicare Other | Attending: Registered Nurse | Admitting: Physical Therapy

## 2015-11-11 DIAGNOSIS — M5442 Lumbago with sciatica, left side: Secondary | ICD-10-CM | POA: Diagnosis not present

## 2015-11-11 DIAGNOSIS — M25552 Pain in left hip: Secondary | ICD-10-CM | POA: Diagnosis not present

## 2015-11-11 NOTE — Therapy (Signed)
[place], [place], [address] Phone: [phone]   Fax:  [phone]  Physical Therapy Treatment  Patient Details  Name: [name] MRN: [id number] Date of Birth: 1941/05/21 Referring Provider: Letta Pate  Encounter Date: 11/11/2015      PT End of Session - 11/11/15 0953    Visit Number 15   Date for PT Re-Evaluation 11/18/15   PT Start Time 0930   PT Stop Time 1030   PT Time Calculation (min) 60 min      Past Medical History  Diagnosis Date  . History of elevated homocysteine   . Thyroid nodule     rt benign colloid nodule--left hyperplastic nodule- Thyroid bx 2006: neg  . Baker's cyst   . Goiter   . Adenomatous colon polyp 03/2003  . PONV (postoperative nausea and vomiting)   . Anxiety     ativan for sleep  . OA (osteoarthritis)      knee pain after TKR left , back pain- last steroid injection back 2 months ago Dr Nelva Bush  . Hypertension 09/11/2005  . Postlaminectomy syndrome, lumbar region     Dr. Nelva Bush, recommended doing bilateral S1 transforaminal epidural with D5W  . Lumbar radiculopathy     Dr. Nelva Bush  . Degenerative lumbar disc     Dr. Nelva Bush    Past Surgical History  Procedure Laterality Date  . Abdominal hysterectomy    . Oophorectomy    . Knee arthroscopy      L   replacement 2007, Dr Corinne Ports, then a manipulation, then redo TKR 11/09 w/ Dr.Alusio. Parcial replacement again 09-2010  . Total hip arthroplasty  3/11    Right   . Facelift    . Incisional hernia repair    . Knee surgery      x 5-  left  . Shoulder surgery      replacements: L 2010, R 2012  . Bakers cystectomy      right knee  . Total knee arthroplasty  05/20/2012    Procedure: TOTAL KNEE ARTHROPLASTY;  Surgeon: Gearlean Alf, MD;  Location: [facility];  Service: Orthopedics;  Laterality: Right;  . Back surgery  11-09-2011    at [facility], lumbar    There were no vitals filed for this visit.  Visit Diagnosis:   Bilateral low back pain with left-sided sciatica  Left hip pain      Subjective Assessment - 11/11/15 0950    Subjective heat and stretching really help bt does not last. Dry needling next thursday 3/9   Currently in Pain? Yes   Pain Score 5    Pain Location Hip   Pain Orientation Left            [facility] PT Assessment - 11/11/15 0001    AROM   Overall AROM Comments ROM decreased 50%   Strength   Overall Strength Comments hips 4-/5                     [facility] Adult PT Treatment/Exercise - 11/11/15 0001    Moist Heat Therapy   Number Minutes Moist Heat 15 Minutes   Moist Heat Location Lumbar Spine   Ultrasound   Ultrasound Location left GT   Ultrasound Parameters 8 min 1 mhz   Iontophoresis   Type of Iontophoresis Dexamethasone   Location left GT   Dose 1.2 mA   Time 4 hour leave on   Manual Therapy  Manual Therapy Passive ROM;Soft tissue mobilization   Soft tissue mobilization hip IT and hip flex   Passive ROM LE and trunk                  PT Short Term Goals - 09/30/15 1524    PT SHORT TERM GOAL #1   Title independent iwth initial HEP   Status Achieved           PT Long Term Goals - 11/11/15 0951    PT LONG TERM GOAL #1   Title understand proper posture and body mechanics instruction   Status Achieved   PT LONG TERM GOAL #2   Title increase lumbar ROM 25%   Baseline 50% decreased    Status On-going   PT LONG TERM GOAL #3   Title report 25% decrease in pain   Status On-going   PT LONG TERM GOAL #4   Title increase LE strength to 4/5   Baseline hips 4-/5   Status On-going               Plan - 11/11/15 0954    Clinical Impression Statement focus on tx to left hip and ROM/stretching. Pt doing bike and Nustep independantly for ROM and strength. Pt very tight and painful with stretching but relief after .   PT Next Visit Plan Renewal due next week. Dry needling next week.        Problem List Patient Active Problem  List   Diagnosis Date Noted  . Lumbar post-laminectomy syndrome 10/04/2015  . Chronic lumbar radiculopathy 10/04/2015  . PCP NOTES >>>>> 08/07/2015  . Allergic rhinitis 01/21/2015  . Leg edema 12/16/2013  . Weight loss 08/29/2012  . Lumbar canal stenosis 08/20/2012  . Medicare annual wellness visit, subsequent 11/03/2011  . Heart murmur 11/03/2011  . Dermatitis 01/19/2011  . THYROID NODULE 03/15/2010  . PERSONAL HX COLONIC POLYPS 04/30/2008  . FATIGUE 04/17/2008  . ANXIETY- Insomnia 01/27/2008  . Osteoarthritis 01/27/2008  . BAKER'S CYST 12/20/2006  . Essential hypertension 09/11/2005    Gowri Suchan,ANGIE PTA 11/11/2015, 9:56 AM  [place], [place], [address] Phone: [phone]   Fax:  [phone]  Name: [name] MRN: [id number] Date of Birth: 05-06-1941

## 2015-11-16 ENCOUNTER — Encounter: Payer: Self-pay | Admitting: Physical Therapy

## 2015-11-16 ENCOUNTER — Ambulatory Visit: Payer: Medicare Other | Admitting: Physical Therapy

## 2015-11-16 DIAGNOSIS — M5442 Lumbago with sciatica, left side: Secondary | ICD-10-CM

## 2015-11-16 DIAGNOSIS — M25552 Pain in left hip: Secondary | ICD-10-CM

## 2015-11-16 NOTE — Therapy (Signed)
[place], [place], [address] Phone: [phone]   Fax:  [phone]  Physical Therapy Treatment  Patient Details  Name: [name] MRN: [id number] Date of Birth: [birth year] Referring Provider: Letta Pate  Encounter Date: 11/16/2015      PT End of Session - 11/16/15 1432    Visit Number 16   Date for PT Re-Evaluation 11/18/15   PT Start Time 1400   PT Stop Time 1500   PT Time Calculation (min) 60 min      Past Medical History  Diagnosis Date  . History of elevated homocysteine   . Thyroid nodule     rt benign colloid nodule--left hyperplastic nodule- Thyroid bx 2006: neg  . Baker's cyst   . Goiter   . Adenomatous colon polyp 03/2003  . PONV (postoperative nausea and vomiting)   . Anxiety     ativan for sleep  . OA (osteoarthritis)      knee pain after TKR left , back pain- last steroid injection back 2 months ago Dr Nelva Bush  . Hypertension 09/11/2005  . Postlaminectomy syndrome, lumbar region     Dr. Nelva Bush, recommended doing bilateral S1 transforaminal epidural with D5W  . Lumbar radiculopathy     Dr. Nelva Bush  . Degenerative lumbar disc     Dr. Nelva Bush    Past Surgical History  Procedure Laterality Date  . Abdominal hysterectomy    . Oophorectomy    . Knee arthroscopy      L   replacement 2007, Dr Corinne Ports, then a manipulation, then redo TKR 11/09 w/ Dr.Alusio. Parcial replacement again 09-2010  . Total hip arthroplasty  3/11    Right   . Facelift    . Incisional hernia repair    . Knee surgery      x 5-  left  . Shoulder surgery      replacements: L 2010, R 2012  . Bakers cystectomy      right knee  . Total knee arthroplasty  05/20/2012    Procedure: TOTAL KNEE ARTHROPLASTY;  Surgeon: Gearlean Alf, MD;  Location: [facility];  Service: Orthopedics;  Laterality: Right;  . Back surgery  11-09-2011    at [facility], lumbar    There were no vitals filed for this visit.  Visit Diagnosis:   Bilateral low back pain with left-sided sciatica  Left hip pain      Subjective Assessment - 11/16/15 1407    Subjective Dr Nelva Bush 3/8 and Dry needling Friday. My legs are on fire today. I do get temp relief and increased mobility after PT sessions   Currently in Pain? Yes   Pain Score 7    Pain Location Leg                         [facility] Adult PT Treatment/Exercise - 11/16/15 0001    Moist Heat Therapy   Number Minutes Moist Heat 15 Minutes   Moist Heat Location Lumbar Spine   Ultrasound   Ultrasound Location left GT   Ultrasound Parameters 8 min   Iontophoresis   Type of Iontophoresis Dexamethasone   Location left GT   Dose 1.2 mA   Time 4 hour leave on   Manual Therapy   Manual Therapy Passive ROM;Soft tissue mobilization   Soft tissue mobilization hip IT and hip flex   Passive ROM LE and trunk  PT Short Term Goals - 09/30/15 1524    PT SHORT TERM GOAL #1   Title independent iwth initial HEP   Status Achieved           PT Long Term Goals - 11/16/15 1431    PT LONG TERM GOAL #1   Title understand proper posture and body mechanics instruction   Status Achieved   PT LONG TERM GOAL #2   Title increase lumbar ROM 25%   Status On-going   PT LONG TERM GOAL #3   Title report 25% decrease in pain   Status On-going   PT LONG TERM GOAL #4   Title increase LE strength to 4/5   Status On-going               Plan - 11/16/15 1433    Clinical Impression Statement temp relief with increased mobility after treatment but not lasting.   PT Next Visit Plan Renewal Done        Problem List Patient Active Problem List   Diagnosis Date Noted  . Lumbar post-laminectomy syndrome 10/04/2015  . Chronic lumbar radiculopathy 10/04/2015  . PCP NOTES >>>>> 08/07/2015  . Allergic rhinitis 01/21/2015  . Leg edema 12/16/2013  . Weight loss 08/29/2012  . Lumbar canal stenosis 08/20/2012  . Medicare annual wellness visit,  subsequent 11/03/2011  . Heart murmur 11/03/2011  . Dermatitis 01/19/2011  . THYROID NODULE 03/15/2010  . PERSONAL HX COLONIC POLYPS 04/30/2008  . FATIGUE 04/17/2008  . ANXIETY- Insomnia 01/27/2008  . Osteoarthritis 01/27/2008  . BAKER'S CYST 12/20/2006  . Essential hypertension 09/11/2005    PAYSEUR,ANGIE PTA 11/16/2015, 2:34 PM  [place], [place], [address] Phone: [phone]   Fax:  [phone]  Name: [name] MRN: [id number] Date of Birth: 06-Jul-1941

## 2015-11-17 ENCOUNTER — Encounter: Payer: Medicare Other | Admitting: Physical Therapy

## 2015-11-18 ENCOUNTER — Encounter: Payer: Medicare Other | Admitting: Physical Therapy

## 2015-11-18 ENCOUNTER — Ambulatory Visit: Payer: Medicare Other | Admitting: Physical Therapy

## 2015-11-23 ENCOUNTER — Ambulatory Visit: Payer: Medicare Other | Admitting: Physical Therapy

## 2015-11-23 DIAGNOSIS — M5136 Other intervertebral disc degeneration, lumbar region: Secondary | ICD-10-CM | POA: Diagnosis not present

## 2015-11-24 ENCOUNTER — Encounter: Payer: Self-pay | Admitting: Physical Therapy

## 2015-11-24 ENCOUNTER — Ambulatory Visit (INDEPENDENT_AMBULATORY_CARE_PROVIDER_SITE_OTHER): Payer: Medicare Other | Admitting: Physical Therapy

## 2015-11-24 DIAGNOSIS — M25552 Pain in left hip: Secondary | ICD-10-CM

## 2015-11-24 DIAGNOSIS — M5442 Lumbago with sciatica, left side: Secondary | ICD-10-CM

## 2015-11-24 DIAGNOSIS — R262 Difficulty in walking, not elsewhere classified: Secondary | ICD-10-CM

## 2015-11-24 NOTE — Patient Instructions (Addendum)

## 2015-11-24 NOTE — Therapy (Signed)
[place], [place], [address] Phone: [phone]   Fax:  [phone]  Physical Therapy Treatment  Patient Details  Name: [name] MRN: [id number] Date of Birth: July 15, 1941 Referring Provider: Letta Pate  Encounter Date: 11/24/2015      PT End of Session - 11/24/15 0912    Visit Number 17   Date for PT Re-Evaluation 12/19/15   PT Start Time 0913   PT Stop Time 1021   PT Time Calculation (min) 68 min      Past Medical History  Diagnosis Date  . History of elevated homocysteine   . Thyroid nodule     rt benign colloid nodule--left hyperplastic nodule- Thyroid bx 2006: neg  . Baker's cyst   . Goiter   . Adenomatous colon polyp 03/2003  . PONV (postoperative nausea and vomiting)   . Anxiety     ativan for sleep  . OA (osteoarthritis)      knee pain after TKR left , back pain- last steroid injection back 2 months ago Dr Nelva Bush  . Hypertension 09/11/2005  . Postlaminectomy syndrome, lumbar region     Dr. Nelva Bush, recommended doing bilateral S1 transforaminal epidural with D5W  . Lumbar radiculopathy     Dr. Nelva Bush  . Degenerative lumbar disc     Dr. Nelva Bush    Past Surgical History  Procedure Laterality Date  . Abdominal hysterectomy    . Oophorectomy    . Knee arthroscopy      L   replacement 2007, Dr Corinne Ports, then a manipulation, then redo TKR 11/09 w/ Dr.Alusio. Parcial replacement again 09-2010  . Total hip arthroplasty  3/11    Right   . Facelift    . Incisional hernia repair    . Knee surgery      x 5-  left  . Shoulder surgery      replacements: L 2010, R 2012  . Bakers cystectomy      right knee  . Total knee arthroplasty  05/20/2012    Procedure: TOTAL KNEE ARTHROPLASTY;  Surgeon: Gearlean Alf, MD;  Location: [facility];  Service: Orthopedics;  Laterality: Right;  . Back surgery  11-09-2011    at [facility], lumbar    There were no vitals filed for this visit.  Visit Diagnosis:   Bilateral low back pain with left-sided sciatica  Left hip pain  Difficulty walking      Subjective Assessment - 11/24/15 0913    Subjective Patient reports that the neuropathy in her legs is really the worse ever since the surgery at [facility].  Lt LE is worse, starts in her back and travels down the leg. Does have pain on the Rt side. Had an injection from Dr Nelva Bush yesterday and doesn't feel any difference yet.    Patient Stated Goals have less pain   Currently in Pain? Yes   Pain Score 4    Pain Location Back   Pain Orientation Left  anterior thigh to lateral Lt hip   Pain Type Chronic pain   Pain Onset More than a month ago   Pain Frequency Constant   Aggravating Factors  increased activity   Pain Relieving Factors medicine helps some                         [facility] Adult PT Treatment/Exercise - 11/24/15 0001    Lumbar Exercises: Stretches   Single Knee to Chest Stretch 20 seconds  Double Knee to Chest Stretch 20 seconds   Lower Trunk Rotation 5 reps   Modalities   Modalities Electrical Stimulation;Moist Heat   Moist Heat Therapy   Number Minutes Moist Heat 15 Minutes   Moist Heat Location Lumbar Spine   Electrical Stimulation   Electrical Stimulation Location lumbar   Electrical Stimulation Action IFC   Electrical Stimulation Parameters to tolerance    Electrical Stimulation Goals Tone;Pain   Manual Therapy   Manual Therapy Soft tissue mobilization   Soft tissue mobilization lumbar paraspinals and QLs , Rt TFL at trigger point          Trigger Point Dry Needling - 11/24/15 1009    Consent Given? Yes   Education Handout Provided Yes   Muscles Treated Upper Body Quadratus Lumborum;Longissimus;Tensor fascia lata  bilat QL/multifidi, Rt TFL   Longissimus Response Twitch response elicited;Palpable increased muscle length  Lt side tighter than Rt              PT Education - 11/24/15 0912    Education provided Yes   Education Details TDN    Person(s) Educated Patient   Methods Explanation;Handout   Comprehension Verbalized understanding          PT Short Term Goals - 09/30/15 1524    PT SHORT TERM GOAL #1   Title independent iwth initial HEP   Status Achieved           PT Long Term Goals - 11/24/15 1015    PT LONG TERM GOAL #1   Title understand proper posture and body mechanics instruction   Status Achieved   PT LONG TERM GOAL #2   Title increase lumbar ROM 25%   Status On-going   PT LONG TERM GOAL #3   Title report 25% decrease in pain   Status On-going   PT LONG TERM GOAL #4   Title increase LE strength to 4/5   Status On-going               Plan - 11/24/15 1013    Clinical Impression Statement [name] had good releases with TDN, She has significant tighness due to chronic and multiple orthopedic issues.  She is tight in her lowand mid back, hips and thighs.  Should feel some ease in mobility after treatment and post needling muscle soreness decreases.    Pt will benefit from skilled therapeutic intervention in order to improve on the following deficits Abnormal gait;Cardiopulmonary status limiting activity;Decreased activity tolerance;Decreased balance;Decreased range of motion;Decreased mobility;Decreased strength;Difficulty walking;Impaired flexibility;Increased muscle spasms;Postural dysfunction;Pain;Improper body mechanics   Rehab Potential Good   PT Frequency 2x / week   PT Duration 8 weeks   PT Treatment/Interventions ADLs/Self Care Home Management;Electrical Stimulation;Moist Heat;Gait training;Functional mobility training;Therapeutic activities;Therapeutic exercise;Balance training;Patient/family education;Manual techniques;Dry needling   PT Next Visit Plan assess response to TDN and continue in lumbar and thighs/hips    Consulted and Agree with Plan of Care Patient        Problem List Patient Active Problem List   Diagnosis Date Noted  . Lumbar post-laminectomy syndrome 10/04/2015   . Chronic lumbar radiculopathy 10/04/2015  . PCP NOTES >>>>> 08/07/2015  . Allergic rhinitis 01/21/2015  . Leg edema 12/16/2013  . Weight loss 08/29/2012  . Lumbar canal stenosis 08/20/2012  . Medicare annual wellness visit, subsequent 11/03/2011  . Heart murmur 11/03/2011  . Dermatitis 01/19/2011  . THYROID NODULE 03/15/2010  . PERSONAL HX COLONIC POLYPS 04/30/2008  . FATIGUE 04/17/2008  . ANXIETY- Insomnia 01/27/2008  .  Osteoarthritis 01/27/2008  . BAKER'S CYST 12/20/2006  . Essential hypertension 09/11/2005    Jeral Pinch PT 11/24/2015, 10:16 AM  [facility] [place], [place], [address] Phone: [phone]   Fax:  [phone]  Name: [name] MRN: [id number] Date of Birth: [birth year]

## 2015-11-25 ENCOUNTER — Ambulatory Visit: Payer: Medicare Other | Admitting: Physical Therapy

## 2015-11-29 ENCOUNTER — Ambulatory Visit (INDEPENDENT_AMBULATORY_CARE_PROVIDER_SITE_OTHER): Payer: Medicare Other | Admitting: Physical Therapy

## 2015-11-29 ENCOUNTER — Encounter: Payer: Self-pay | Admitting: Physical Therapy

## 2015-11-29 DIAGNOSIS — M5442 Lumbago with sciatica, left side: Secondary | ICD-10-CM | POA: Diagnosis present

## 2015-11-29 DIAGNOSIS — M25552 Pain in left hip: Secondary | ICD-10-CM

## 2015-11-29 DIAGNOSIS — R262 Difficulty in walking, not elsewhere classified: Secondary | ICD-10-CM | POA: Diagnosis not present

## 2015-11-29 NOTE — Therapy (Signed)
[place], [place], [address] Phone: [phone]   Fax:  [phone]  Physical Therapy Treatment  Patient Details  Name: [name] MRN: [id number] Date of Birth: 1941/05/07 Referring Provider: Letta Pate  Encounter Date: 11/29/2015      PT End of Session - 11/29/15 1005    Visit Number 18   Date for PT Re-Evaluation 12/19/15   PT Start Time 1006   PT Stop Time 1106   PT Time Calculation (min) 60 min   Activity Tolerance Patient tolerated treatment well      Past Medical History  Diagnosis Date  . History of elevated homocysteine   . Thyroid nodule     rt benign colloid nodule--left hyperplastic nodule- Thyroid bx 2006: neg  . Baker's cyst   . Goiter   . Adenomatous colon polyp 03/2003  . PONV (postoperative nausea and vomiting)   . Anxiety     ativan for sleep  . OA (osteoarthritis)      knee pain after TKR left , back pain- last steroid injection back 2 months ago Dr Nelva Bush  . Hypertension 09/11/2005  . Postlaminectomy syndrome, lumbar region     Dr. Nelva Bush, recommended doing bilateral S1 transforaminal epidural with D5W  . Lumbar radiculopathy     Dr. Nelva Bush  . Degenerative lumbar disc     Dr. Nelva Bush    Past Surgical History  Procedure Laterality Date  . Abdominal hysterectomy    . Oophorectomy    . Knee arthroscopy      L   replacement 2007, Dr Corinne Ports, then a manipulation, then redo TKR 11/09 w/ Dr.Alusio. Parcial replacement again 09-2010  . Total hip arthroplasty  3/11    Right   . Facelift    . Incisional hernia repair    . Knee surgery      x 5-  left  . Shoulder surgery      replacements: L 2010, R 2012  . Bakers cystectomy      right knee  . Total knee arthroplasty  05/20/2012    Procedure: TOTAL KNEE ARTHROPLASTY;  Surgeon: Gearlean Alf, MD;  Location: [facility];  Service: Orthopedics;  Laterality: Right;  . Back surgery  11-09-2011    at [facility], lumbar    There were  no vitals filed for this visit.  Visit Diagnosis:  Bilateral low back pain with left-sided sciatica  Left hip pain  Difficulty walking      Subjective Assessment - 11/29/15 1011    Subjective Pt is not sure if the needle therapy helped her.  She is still having pain in her low back.  The front/side of the Rt thigh feel a little better.  She wants to give the needling another try as she was sore before the last treatment and wonders  if that made a difference   Currently in Pain? Yes   Pain Score 5    Pain Location Back   Pain Orientation Right;Left   Pain Descriptors / Indicators Aching;Stabbing;Throbbing   Pain Type Chronic pain   Pain Onset More than a month ago   Pain Frequency Constant   Aggravating Factors  increased activity, burning is constant   Pain Relieving Factors medicine                         [facility] Adult PT Treatment/Exercise - 11/29/15 0001    Lumbar Exercises: Stretches   Double Knee to  Chest Stretch 20 seconds   Lower Trunk Rotation --  10 reports   Lower Trunk Rotation Limitations seated lumbar stretch in chair   Modalities   Modalities Electrical Stimulation;Moist Heat   Moist Heat Therapy   Number Minutes Moist Heat 15 Minutes   Moist Heat Location Lumbar Spine   Electrical Stimulation   Electrical Stimulation Location lumbar   Electrical Stimulation Action IFC   Electrical Stimulation Parameters  to tolerance   Electrical Stimulation Goals Tone;Pain   Manual Therapy   Manual Therapy Soft tissue mobilization   Soft tissue mobilization lumbar paraspinals, bilat gluts and QLs          Trigger Point Dry Needling - 11/29/15 1014    Consent Given? Yes   Education Handout Provided No   Muscles Treated Upper Body Quadratus Lumborum;Longissimus  Rt   Muscles Treated Lower Body Gluteus minimus;Gluteus maximus;Piriformis   Longissimus Response Twitch response elicited;Palpable increased muscle length  used HV stim on the Rt side    Gluteus Maximus Response Twitch response elicited;Palpable increased muscle length  on the Rt , minimal response on the Lt   Piriformis Response Twitch response elicited;Palpable increased muscle length  Rt , not on the left                PT Short Term Goals - 09/30/15 1524    PT SHORT TERM GOAL #1   Title independent iwth initial HEP   Status Achieved           PT Long Term Goals - 11/29/15 1058    PT LONG TERM GOAL #1   Title understand proper posture and body mechanics instruction   Status Achieved   PT LONG TERM GOAL #2   Title increase lumbar ROM 25%   Status On-going   PT LONG TERM GOAL #3   Title report 25% decrease in pain   Status On-going   PT LONG TERM GOAL #4   Title increase LE strength to 4/5   Status On-going               Plan - 11/29/15 1056    Clinical Impression Statement this was [name]'s second visit with me for TDN.  She is not sure if she had relief after the first session however she had palpable decrease in muscular tightness on the Lt side.  Rt side was very tight today.  She continues with gait abnormalities and muscular imbalances from multiple years of surgeries.    Pt will benefit from skilled therapeutic intervention in order to improve on the following deficits Abnormal gait;Cardiopulmonary status limiting activity;Decreased activity tolerance;Decreased balance;Decreased range of motion;Decreased mobility;Decreased strength;Difficulty walking;Impaired flexibility;Increased muscle spasms;Postural dysfunction;Pain;Improper body mechanics   Rehab Potential Good   PT Frequency 2x / week   PT Duration 8 weeks   PT Treatment/Interventions ADLs/Self Care Home Management;Electrical Stimulation;Moist Heat;Gait training;Functional mobility training;Therapeutic activities;Therapeutic exercise;Balance training;Patient/family education;Manual techniques;Dry needling   PT Next Visit Plan assess response to TDN and continue in lumbar and  thighs/hips see if pt wishes to continue with TDN or return to traditional treatments.    Consulted and Agree with Plan of Care Patient        Problem List Patient Active Problem List   Diagnosis Date Noted  . Lumbar post-laminectomy syndrome 10/04/2015  . Chronic lumbar radiculopathy 10/04/2015  . PCP NOTES >>>>> 08/07/2015  . Allergic rhinitis 01/21/2015  . Leg edema 12/16/2013  . Weight loss 08/29/2012  . Lumbar canal stenosis 08/20/2012  . Medicare  annual wellness visit, subsequent 11/03/2011  . Heart murmur 11/03/2011  . Dermatitis 01/19/2011  . THYROID NODULE 03/15/2010  . PERSONAL HX COLONIC POLYPS 04/30/2008  . FATIGUE 04/17/2008  . ANXIETY- Insomnia 01/27/2008  . Osteoarthritis 01/27/2008  . BAKER'S CYST 12/20/2006  . Essential hypertension 09/11/2005    Jeral Pinch PT 11/29/2015, 11:00 AM  [facility] [place], [place], [address] Phone: [phone]   Fax:  [phone]  Name: [name] MRN: [id number] Date of Birth: 1940-12-10

## 2015-12-01 ENCOUNTER — Ambulatory Visit: Payer: Medicare Other | Admitting: Physical Therapy

## 2015-12-07 ENCOUNTER — Encounter: Payer: Medicare Other | Admitting: Physical Therapy

## 2015-12-17 ENCOUNTER — Other Ambulatory Visit: Payer: Self-pay | Admitting: Internal Medicine

## 2016-01-05 DIAGNOSIS — M5417 Radiculopathy, lumbosacral region: Secondary | ICD-10-CM | POA: Diagnosis not present

## 2016-01-05 DIAGNOSIS — G894 Chronic pain syndrome: Secondary | ICD-10-CM | POA: Diagnosis not present

## 2016-01-05 DIAGNOSIS — M961 Postlaminectomy syndrome, not elsewhere classified: Secondary | ICD-10-CM | POA: Diagnosis not present

## 2016-01-05 DIAGNOSIS — M5441 Lumbago with sciatica, right side: Secondary | ICD-10-CM | POA: Diagnosis not present

## 2016-01-05 DIAGNOSIS — Z5181 Encounter for therapeutic drug level monitoring: Secondary | ICD-10-CM | POA: Diagnosis not present

## 2016-01-05 DIAGNOSIS — Z79899 Other long term (current) drug therapy: Secondary | ICD-10-CM | POA: Diagnosis not present

## 2016-01-11 DIAGNOSIS — M5417 Radiculopathy, lumbosacral region: Secondary | ICD-10-CM | POA: Diagnosis not present

## 2016-01-19 DIAGNOSIS — M25562 Pain in left knee: Secondary | ICD-10-CM | POA: Diagnosis not present

## 2016-01-19 DIAGNOSIS — G8929 Other chronic pain: Secondary | ICD-10-CM | POA: Diagnosis not present

## 2016-01-19 DIAGNOSIS — M25561 Pain in right knee: Secondary | ICD-10-CM | POA: Diagnosis not present

## 2016-01-21 ENCOUNTER — Telehealth: Payer: Self-pay

## 2016-01-21 MED ORDER — LORAZEPAM 0.5 MG PO TABS: 0.5000 mg | ORAL_TABLET | Freq: Every evening | ORAL | Status: DC | PRN

## 2016-01-21 NOTE — Telephone Encounter (Signed)
Pt is requesting refill on Lorazepam.  Last OV: 08/04/2015, CPE scheduled for 02/10/2016 Last Fill: 08/04/2015 #30 and 1RF UDS: 12/01/2013 Low risk  Please advise.

## 2016-01-21 NOTE — Telephone Encounter (Signed)
Okay #30 and one refill 

## 2016-01-21 NOTE — Telephone Encounter (Signed)
Rx faxed to [facility] pharmacy.  

## 2016-01-21 NOTE — Telephone Encounter (Signed)
Rx printed, awaiting MD signature.  

## 2016-01-25 DIAGNOSIS — M25561 Pain in right knee: Secondary | ICD-10-CM | POA: Diagnosis not present

## 2016-01-25 DIAGNOSIS — M5442 Lumbago with sciatica, left side: Secondary | ICD-10-CM | POA: Diagnosis not present

## 2016-01-25 DIAGNOSIS — M4806 Spinal stenosis, lumbar region: Secondary | ICD-10-CM | POA: Diagnosis not present

## 2016-01-25 DIAGNOSIS — G894 Chronic pain syndrome: Secondary | ICD-10-CM | POA: Diagnosis not present

## 2016-02-09 ENCOUNTER — Telehealth: Payer: Self-pay | Admitting: *Deleted

## 2016-02-09 NOTE — Telephone Encounter (Signed)
Unable to reach patient at time of pre-visit call. Left message for patient to return call when available.  

## 2016-02-10 ENCOUNTER — Ambulatory Visit (INDEPENDENT_AMBULATORY_CARE_PROVIDER_SITE_OTHER): Payer: Medicare Other | Admitting: Internal Medicine

## 2016-02-10 ENCOUNTER — Encounter: Payer: Self-pay | Admitting: Internal Medicine

## 2016-02-10 VITALS — BP 126/74 | HR 77 | Temp 98.2°F | Ht 65.0 in | Wt 167.0 lb

## 2016-02-10 DIAGNOSIS — E049 Nontoxic goiter, unspecified: Secondary | ICD-10-CM | POA: Diagnosis not present

## 2016-02-10 DIAGNOSIS — E7211 Homocystinuria: Secondary | ICD-10-CM | POA: Diagnosis not present

## 2016-02-10 DIAGNOSIS — I10 Essential (primary) hypertension: Secondary | ICD-10-CM | POA: Diagnosis not present

## 2016-02-10 DIAGNOSIS — F411 Generalized anxiety disorder: Secondary | ICD-10-CM

## 2016-02-10 DIAGNOSIS — M159 Polyosteoarthritis, unspecified: Secondary | ICD-10-CM

## 2016-02-10 DIAGNOSIS — Z0000 Encounter for general adult medical examination without abnormal findings: Secondary | ICD-10-CM | POA: Diagnosis not present

## 2016-02-10 DIAGNOSIS — M8949 Other hypertrophic osteoarthropathy, multiple sites: Secondary | ICD-10-CM

## 2016-02-10 DIAGNOSIS — M150 Primary generalized (osteo)arthritis: Secondary | ICD-10-CM

## 2016-02-10 DIAGNOSIS — E010 Iodine-deficiency related diffuse (endemic) goiter: Secondary | ICD-10-CM

## 2016-02-10 DIAGNOSIS — R7989 Other specified abnormal findings of blood chemistry: Secondary | ICD-10-CM

## 2016-02-10 DIAGNOSIS — Z780 Asymptomatic menopausal state: Secondary | ICD-10-CM

## 2016-02-10 DIAGNOSIS — G4700 Insomnia, unspecified: Secondary | ICD-10-CM | POA: Diagnosis not present

## 2016-02-10 DIAGNOSIS — Z09 Encounter for follow-up examination after completed treatment for conditions other than malignant neoplasm: Secondary | ICD-10-CM

## 2016-02-10 LAB — CBC WITH DIFFERENTIAL/PLATELET
Basophils Absolute: 0 10*3/uL (ref 0.0–0.1)
Basophils Relative: 0.4 % (ref 0.0–3.0)
Eosinophils Absolute: 0.2 10*3/uL (ref 0.0–0.7)
Eosinophils Relative: 2.2 % (ref 0.0–5.0)
HCT: 40.4 % (ref 36.0–46.0)
Hemoglobin: 13.3 g/dL (ref 12.0–15.0)
Lymphocytes Relative: 19.9 % (ref 12.0–46.0)
Lymphs Abs: 1.9 10*3/uL (ref 0.7–4.0)
MCHC: 32.8 g/dL (ref 30.0–36.0)
MCV: 90.7 fl (ref 78.0–100.0)
Monocytes Absolute: 0.7 10*3/uL (ref 0.1–1.0)
Monocytes Relative: 6.7 % (ref 3.0–12.0)
Neutro Abs: 6.9 10*3/uL (ref 1.4–7.7)
Neutrophils Relative %: 70.8 % (ref 43.0–77.0)
Platelets: 342 10*3/uL (ref 150.0–400.0)
RBC: 4.46 Mil/uL (ref 3.87–5.11)
RDW: 15.3 % (ref 11.5–15.5)
WBC: 9.7 10*3/uL (ref 4.0–10.5)

## 2016-02-10 LAB — BASIC METABOLIC PANEL
BUN: 18 mg/dL (ref 6–23)
CO2: 30 mEq/L (ref 19–32)
Calcium: 10.2 mg/dL (ref 8.4–10.5)
Chloride: 97 mEq/L (ref 96–112)
Creatinine, Ser: 0.96 mg/dL (ref 0.40–1.20)
GFR: 60.19 mL/min (ref >60.00)
Glucose, Bld: 96 mg/dL (ref 70–99)
Potassium: 4.5 mEq/L (ref 3.5–5.1)
Sodium: 136 mEq/L (ref 135–145)

## 2016-02-10 MED ORDER — LISINOPRIL-HYDROCHLOROTHIAZIDE 20-12.5 MG PO TABS: 1.0000 | ORAL_TABLET | Freq: Every day | ORAL | Status: DC

## 2016-02-10 MED ORDER — GABAPENTIN 800 MG PO TABS: 800.0000 mg | ORAL_TABLET | Freq: Four times a day (QID) | ORAL | Status: DC

## 2016-02-10 NOTE — Patient Instructions (Signed)
Get your blood work before you leave    Please consider visit these websites for more information about the healthcare power of attorney:  www.begintheconversation.org  Theconversationproject.org  Next visit in 6 months    Fall Prevention and Home Safety Falls cause injuries and can affect all age groups. It is possible to use preventive measures to significantly decrease the likelihood of falls. There are many simple measures which can make your home safer and prevent falls. OUTDOORS  Repair cracks and edges of walkways and driveways.  Remove high doorway thresholds.  Trim shrubbery on the main path into your home.  Have good outside lighting.  Clear walkways of tools, rocks, debris, and clutter.  Check that handrails are not broken and are securely fastened. Both sides of steps should have handrails.  Have leaves, snow, and ice cleared regularly.  Use sand or salt on walkways during winter months.  In the garage, clean up grease or oil spills. BATHROOM  Install night lights.  Install grab bars by the toilet and in the tub and shower.  Use non-skid mats or decals in the tub or shower.  Place a plastic non-slip stool in the shower to sit on, if needed.  Keep floors dry and clean up all water on the floor immediately.  Remove soap buildup in the tub or shower on a regular basis.  Secure bath mats with non-slip, double-sided rug tape.  Remove throw rugs and tripping hazards from the floors. BEDROOMS  Install night lights.  Make sure a bedside light is easy to reach.  Do not use oversized bedding.  Keep a telephone by your bedside.  Have a firm chair with side arms to use for getting dressed.  Remove throw rugs and tripping hazards from the floor. KITCHEN  Keep handles on pots and pans turned toward the center of the stove. Use back burners when possible.  Clean up spills quickly and allow time for drying.  Avoid walking on wet floors.  Avoid hot  utensils and knives.  Position shelves so they are not too high or low.  Place commonly used objects within easy reach.  If necessary, use a sturdy step stool with a grab bar when reaching.  Keep electrical cables out of the way.  Do not use floor polish or wax that makes floors slippery. If you must use wax, use non-skid floor wax.  Remove throw rugs and tripping hazards from the floor. STAIRWAYS  Never leave objects on stairs.  Place handrails on both sides of stairways and use them. Fix any loose handrails. Make sure handrails on both sides of the stairways are as long as the stairs.  Check carpeting to make sure it is firmly attached along stairs. Make repairs to worn or loose carpet promptly.  Avoid placing throw rugs at the top or bottom of stairways, or properly secure the rug with carpet tape to prevent slippage. Get rid of throw rugs, if possible.  Have an electrician put in a light switch at the top and bottom of the stairs. OTHER FALL PREVENTION TIPS  Wear low-heel or rubber-soled shoes that are supportive and fit well. Wear closed toe shoes.  When using a stepladder, make sure it is fully opened and both spreaders are firmly locked. Do not climb a closed stepladder.  Add color or contrast paint or tape to grab bars and handrails in your home. Place contrasting color strips on first and last steps.  Learn and use mobility aids as needed. Install an  electrical emergency response system.  Turn on lights to avoid dark areas. Replace light bulbs that burn out immediately. Get light switches that glow.  Arrange furniture to create clear pathways. Keep furniture in the same place.  Firmly attach carpet with non-skid or double-sided tape.  Eliminate uneven floor surfaces.  Select a carpet pattern that does not visually hide the edge of steps.  Be aware of all pets. OTHER HOME SAFETY TIPS  Set the water temperature for 120 F (48.8 C).  Keep emergency numbers on  or near the telephone.  Keep smoke detectors on every level of the home and near sleeping areas. Document Released: 08/18/2002 Document Revised: 02/27/2012 Document Reviewed: 11/17/2011 [facility] Patient Information 2015 [employer], [place]. This information is not intended to replace advice given to you by your health care provider. Make sure you discuss any questions you have with your health care provider.   Preventive Care for Adults Ages 38 and over  Blood pressure check.** / Every 1 to 2 years.  Lipid and cholesterol check.**/ Every 5 years beginning at age 86.  Lung cancer screening. / Every year if you are aged 59-80 years and have a 30-pack-year history of smoking and currently smoke or have quit within the past 15 years. Yearly screening is stopped once you have quit smoking for at least 15 years or develop a health problem that would prevent you from having lung cancer treatment.  Fecal occult blood test (FOBT) of stool. / Every year beginning at age 18 and continuing until age 7. You may not have to do this test if you get a colonoscopy every 10 years.  Flexible sigmoidoscopy** or colonoscopy.** / Every 5 years for a flexible sigmoidoscopy or every 10 years for a colonoscopy beginning at age 80 and continuing until age 27.  Hepatitis C blood test.** / For all people born from [birth year] through 1965 and any individual with known risks for hepatitis C.  Abdominal aortic aneurysm (AAA) screening.** / A one-time screening for ages 73 to 40 years who are current or former smokers.  Skin self-exam. / Monthly.  Influenza vaccine. / Every year.  Tetanus, diphtheria, and acellular pertussis (Tdap/Td) vaccine.** / 1 dose of Td every 10 years.  Varicella vaccine.** / Consult your health care provider.  Zoster vaccine.** / 1 dose for adults aged 39 years or older.  Pneumococcal 13-valent conjugate (PCV13) vaccine.** / Consult your health care provider.  Pneumococcal polysaccharide (PPSV23)  vaccine.** / 1 dose for all adults aged 61 years and older.  Meningococcal vaccine.** / Consult your health care provider.  Hepatitis A vaccine.** / Consult your health care provider.  Hepatitis B vaccine.** / Consult your health care provider.  Haemophilus influenzae type b (Hib) vaccine.** / Consult your health care provider. **Family history and personal history of risk and conditions may change your health care provider's recommendations. Document Released: 10/24/2001 Document Revised: 09/02/2013 Document Reviewed: 01/23/2011 [facility] Patient Information 2015 [place], [place]. This information is not intended to replace advice given to you by your health care provider. Make sure you discuss any questions you have with your health care provider.

## 2016-02-10 NOTE — Assessment & Plan Note (Signed)
HTN: Continue Zestoretic, check a BMP and CBC Hyperlipidemia, on Zocor, last FLP satisfactory Anxiety insomnia: Continue Ativan, contract signed, gets UDS at pain management Elevated homocysteine: On folic acid and 123456, check labs Goiter,  R thyroid gland seems larger on exam today, recheck a ultrasound Pain management, gabapentin works, still has significant amount of pain RTC 6 months

## 2016-02-10 NOTE — Progress Notes (Signed)
Subjective:    Patient ID: CASHA SALEN, female    DOB: 10/06/1940, 75 y.o.   MRN: [id number]  DOS:  02/10/2016 Type of visit - description :  Interval history: Here for Medicare AWV:  1. Risk factors based on Past M, S, F history: reviewed 2. Physical Activities:  Limited by pain, some walking 3. Depression/mood: good and bad days, pain causes depression sometimes  4. Hearing:  No problems noted or reported  5. ADL's: independent, drives  6. Fall Risk: no recent falls, prevention discussed , see AVS 7. home Safety: does feel safe at home  8. Height, weight, & visual acuity: see VS, sees eye doctor regulalrly 9. Counseling: provided 10. Labs ordered based on risk factors: if needed  11. Referral Coordination: if needed 12. Care Plan, see assessment and plan , written personalized plan provided , see AVS 13. Cognitive Assessment: motor skills limited by pain-DJD 14. Care team updated  15. End-of-life care, rec to get a HC-POA  In addition, today we discussed the following: HTN: Good compliance with meds, ambulatory BPs within normal Anxiety: Well-controlled with Ativan as needed Pain management done elsewhere, on hydrocodone, developed itching w/ Ultram. Has on and off pain due to DJD and neuropathy, burning sensation at the whole left leg is the most noticeable symptom.   Review of Systems Constitutional: No fever. No chills. No unexplained wt changes. No unusual sweats  HEENT: No dental problems, no ear discharge, no facial swelling, no voice changes. No eye discharge, no eye  redness , no  intolerance to light   Respiratory: No wheezing , no  difficulty breathing. No cough , no mucus production  Cardiovascular: No CP, no leg swelling , no  Palpitations  GI: no nausea, no vomiting, no diarrhea , no  abdominal pain.  No blood in the stools. No dysphagia, no odynophagia    Endocrine: No polyphagia, no polyuria , no polydipsia  GU: No dysuria, gross hematuria, difficulty  urinating. No urinary urgency, no frequency.  Musculoskeletal: on going sx   Skin: No change in the color of the skin, palor , no  Rash  Allergic, immunologic: No environmental allergies , no  food allergies  Neurological: No dizziness no  syncope. No headaches. No diplopia, no slurred, no slurred speech, no motor deficits, no facial  Numbness  Hematological: No enlarged lymph nodes, no easy bruising , no unusual bleedings  Psychiatry: No suicidal ideas, no hallucinations, no beavior problems, no confusion.  Good days and bad days due to pain but no persistent depression.  Past Medical History  Diagnosis Date  . History of elevated homocysteine   . Thyroid nodule     rt benign colloid nodule--left hyperplastic nodule- Thyroid bx 2006: neg  . Baker's cyst   . Goiter   . Adenomatous colon polyp 03/2003  . PONV (postoperative nausea and vomiting)   . Anxiety     ativan for sleep  . OA (osteoarthritis)      knee pain after TKR left , back pain- last steroid injection back 2 months ago Dr Nelva Bush  . Hypertension 09/11/2005  . Postlaminectomy syndrome, lumbar region     Dr. Nelva Bush, recommended doing bilateral S1 transforaminal epidural with D5W  . Lumbar radiculopathy     Dr. Nelva Bush, epidureal injections  . Degenerative lumbar disc     Dr. Nelva Bush, epidural injections  . Lumbosacral radiculopathy     seeing Pain Management    Past Surgical History  Procedure Laterality Date  .  Abdominal hysterectomy    . Oophorectomy    . Knee arthroscopy      L   replacement 2007, Dr Corinne Ports, then a manipulation, then redo TKR 11/09 w/ Dr.Alusio. Parcial replacement again 09-2010  . Total hip arthroplasty  3/11    Right   . Facelift    . Incisional hernia repair    . Knee surgery      x 5-  left  . Shoulder surgery      replacements: L 2010, R 2012  . Bakers cystectomy      right knee  . Total knee arthroplasty  05/20/2012    Procedure: TOTAL KNEE ARTHROPLASTY;  Surgeon: Gearlean Alf, MD;   Location: [facility];  Service: Orthopedics;  Laterality: Right;  . Back surgery  11-09-2011    at [facility], lumbar    Social History   Social History  . Marital Status: Married    Spouse Name: N/A  . Number of Children: 2  . Years of Education: N/A   Occupational History  . Retired Pharmacist, hospital     retired  .     Social History Main Topics  . Smoking status: Former Smoker    Quit date: 09/12/1983  . Smokeless tobacco: Never Used  . Alcohol Use: No  . Drug Use: No  . Sexual Activity: Not on file   Other Topics Concern  . Not on file   Social History Narrative   Lives w/ husband of 66 years in a 3 story home. Has 2 sons.  Retired Education officer, museum.  Education: college.              Family History  Problem Relation Age of Onset  . Breast cancer Neg Hx   . Colon cancer Neg Hx   . Pancreatic cancer Neg Hx   . Stomach cancer Neg Hx   . Coronary artery disease Father     CABG at 75  . Prostate cancer Father   . Colon polyps Father   . Coronary artery disease Brother   . Colon polyps Brother        Medication List       This list is accurate as of: 02/10/16 12:55 PM.  Always use your most recent med list.               aspirin 81 MG tablet  Take 81 mg by mouth daily.     CALCIUM + D PO  Take 600 mg by mouth daily after breakfast.     folic acid 1 MG tablet  Commonly known as:  FOLVITE  Take 1 tablet (1 mg total) by mouth daily.     gabapentin 800 MG tablet  Commonly known as:  NEURONTIN  Take 1 tablet (800 mg total) by mouth 4 (four) times daily.     HYDROcodone-acetaminophen 5-325 MG tablet  Commonly known as:  NORCO/VICODIN  Take 1 tablet by mouth 2 (two) times daily as needed.     hydrocortisone 2.5 % cream     lisinopril-hydrochlorothiazide 20-12.5 MG tablet  Commonly known as:  PRINZIDE,ZESTORETIC  Take 1 tablet by mouth daily.     LORazepam 0.5 MG tablet  Commonly known as:  ATIVAN  Take 1 tablet (0.5 mg total) by mouth at bedtime as needed for  sleep.     simvastatin 20 MG tablet  Commonly known as:  ZOCOR  Take 1 tablet (20 mg total) by mouth daily.     tretinoin 0.1 % cream  Commonly known as:  RETIN-A     TYLENOL PO  Take by mouth as needed.           Objective:   Physical Exam BP 126/74 mmHg  Pulse 77  Temp(Src) 98.2 F (36.8 C) (Oral)  Ht 5\' 5"  (1.651 m)  Wt 167 lb (75.751 kg)  BMI 27.79 kg/m2  SpO2 96% General:   Well developed, well nourished . NAD.  Neck: Right sided thyroid enlargement around 2 cm. Not tender, not nodular HEENT:  Normocephalic . Face symmetric, atraumatic Breast: no dominant mass, skin and nipples normal to inspection on palpation, axillary areas without mass or lymphadenopathy Lungs:  CTA B Normal respiratory effort, no intercostal retractions, no accessory muscle use. Heart: RRR,  no murmur.  No pretibial edema bilaterally  Abdomen:  Not distended, soft, non-tender. No rebound or rigidity.   Skin: Exposed areas without rash. Not pale. Not jaundice Neurologic:  alert & oriented X3.  Speech normal,   Gait quite limited by DJD  Psych: Cognition and judgment appear intact.  Cooperative with normal attention span and concentration.  Behavior appropriate. No anxious or depressed appearing.     Assessment & Plan:   Assessment> HTN Hyperlipidemia Anxiety, insomnia, ativan rx per pcp Elevated homocysteine Goiter, Thyroid nodule BX 2006 neg, [place] 2013 stable MSK: ---DJD . ---Postlaminectomy syndrome, back surgery 2013  ---Intolerant to Lyrica, gabapentin helps --hydrocodone rx elsewhere   Plan: HTN: Continue Zestoretic, check a BMP and CBC Hyperlipidemia, on Zocor, last FLP satisfactory Anxiety insomnia: Continue Ativan, contract signed, gets UDS at pain management Elevated homocysteine: On folic acid and 123456, check labs Goiter,  R thyroid gland seems larger on exam today, recheck a ultrasound Pain management, gabapentin works, still has significant amount of pain RTC  6 months

## 2016-02-10 NOTE — Progress Notes (Signed)
Pre visit review using our clinic review tool, if applicable. No additional management support is needed unless otherwise documented below in the visit note. 

## 2016-02-10 NOTE — Assessment & Plan Note (Addendum)
Td 2013; pneumonia shot 09; prevnar 2016;  zostavax 2008 H/p palpable aorta without a bruit ---- Ao u/s (-) 2014  multiple neg MMG, last 04-2015 . Breast exam today (-) s/p Hysterectomy for bening reasons, ? h/o abnormal PAP x 1 decades ago, ? s/p LEEP in the 70s-80s---->  PAP 2013 (-)  ; today we agreed no further paps  Colonoscopy:  04/08/2003  (Results:  Polyps) , cscope 2009 (-), again 10-2013 (-) ---> repeat in 5 years per report   DEXA 2006 and 04-2010 --normal ,  repeat dexa   Diet-exercise discussed

## 2016-02-13 ENCOUNTER — Other Ambulatory Visit: Payer: Self-pay | Admitting: Internal Medicine

## 2016-02-21 ENCOUNTER — Ambulatory Visit (HOSPITAL_BASED_OUTPATIENT_CLINIC_OR_DEPARTMENT_OTHER)
Admission: RE | Admit: 2016-02-21 | Discharge: 2016-02-21 | Disposition: A | Discharge status: Home / Self Care | Payer: Medicare Other | Source: Ambulatory Visit | Attending: Internal Medicine | Admitting: Internal Medicine

## 2016-02-21 ENCOUNTER — Other Ambulatory Visit (INDEPENDENT_AMBULATORY_CARE_PROVIDER_SITE_OTHER): Payer: Medicare Other

## 2016-02-21 DIAGNOSIS — E041 Nontoxic single thyroid nodule: Secondary | ICD-10-CM | POA: Diagnosis not present

## 2016-02-21 DIAGNOSIS — M85832 Other specified disorders of bone density and structure, left forearm: Secondary | ICD-10-CM | POA: Diagnosis not present

## 2016-02-21 DIAGNOSIS — R7989 Other specified abnormal findings of blood chemistry: Secondary | ICD-10-CM

## 2016-02-21 DIAGNOSIS — E7211 Homocystinuria: Secondary | ICD-10-CM

## 2016-02-21 DIAGNOSIS — Z780 Asymptomatic menopausal state: Secondary | ICD-10-CM | POA: Insufficient documentation

## 2016-02-21 DIAGNOSIS — Z13820 Encounter for screening for osteoporosis: Secondary | ICD-10-CM | POA: Diagnosis not present

## 2016-02-21 LAB — HOMOCYSTEINE: Homocysteine: 11.8 umol/L — ABNORMAL HIGH (ref <10.4)

## 2016-02-23 DIAGNOSIS — G894 Chronic pain syndrome: Secondary | ICD-10-CM | POA: Diagnosis not present

## 2016-02-24 ENCOUNTER — Telehealth: Payer: Self-pay | Admitting: Internal Medicine

## 2016-02-24 DIAGNOSIS — E041 Nontoxic single thyroid nodule: Secondary | ICD-10-CM

## 2016-02-24 MED ORDER — FOLIC ACID 1 MG PO TABS: 2.0000 mg | ORAL_TABLET | Freq: Every day | ORAL | Status: DC

## 2016-02-24 NOTE — Telephone Encounter (Signed)
Spoke w/ Pt, informed her of results, informed her Dr. Larose Kells would like for her to have thyroid Bx completed. Instructed her to increase Folic Acid to 2 tablets daily and continue OTC B12 supplements. Pt verbalized understanding. Folic Acid increased to 2 mg and sent to [facility] on [address].

## 2016-02-24 NOTE — Addendum Note (Signed)
Addended byDamita Dunnings D on: 02/24/2016 01:40 PM   Modules accepted: Orders

## 2016-02-24 NOTE — Telephone Encounter (Signed)
Advise patient, thyroid nodule has increased in size, please schedule a thyroid biopsy with radiology Homocystine levels have improved but need to get better. Definitely continue taking B12 supplements OTC and increase folic acid 1 mg to 2 tablets daily, send a new prescription.

## 2016-03-06 DIAGNOSIS — M25561 Pain in right knee: Secondary | ICD-10-CM | POA: Diagnosis not present

## 2016-03-06 DIAGNOSIS — M4806 Spinal stenosis, lumbar region: Secondary | ICD-10-CM | POA: Diagnosis not present

## 2016-03-06 DIAGNOSIS — M5442 Lumbago with sciatica, left side: Secondary | ICD-10-CM | POA: Diagnosis not present

## 2016-03-06 DIAGNOSIS — G894 Chronic pain syndrome: Secondary | ICD-10-CM | POA: Diagnosis not present

## 2016-03-15 ENCOUNTER — Ambulatory Visit
Admission: RE | Admit: 2016-03-15 | Discharge: 2016-03-15 | Disposition: A | Discharge status: Home / Self Care | Payer: Medicare Other | Source: Ambulatory Visit | Attending: Internal Medicine | Admitting: Internal Medicine

## 2016-03-15 ENCOUNTER — Other Ambulatory Visit (HOSPITAL_COMMUNITY)
Admission: RE | Admit: 2016-03-15 | Discharge: 2016-03-15 | Disposition: A | Discharge status: Home / Self Care | Payer: Medicare Other | Source: Ambulatory Visit | Attending: General Surgery | Admitting: General Surgery

## 2016-03-15 DIAGNOSIS — E041 Nontoxic single thyroid nodule: Secondary | ICD-10-CM | POA: Insufficient documentation

## 2016-03-17 ENCOUNTER — Telehealth: Payer: Self-pay

## 2016-03-17 DIAGNOSIS — M5417 Radiculopathy, lumbosacral region: Secondary | ICD-10-CM | POA: Diagnosis not present

## 2016-03-17 MED ORDER — LIDOCAINE 5 % EX PTCH: 1.0000 | MEDICATED_PATCH | CUTANEOUS | Status: DC

## 2016-03-17 NOTE — Telephone Encounter (Signed)
Per Pt formulary list:   lidocaine patch 5% 3 PA, QL (90 patches/30 days)   Lidocaine patch 5 % is a Tier 3 w/ PA, quantity limit of 90 patches for 30 days.

## 2016-03-17 NOTE — Telephone Encounter (Signed)
Rx sent to [facility] pharmacy.  

## 2016-03-17 NOTE — Telephone Encounter (Signed)
We could try to send in an Rx. However most of these get denies by insurance now. There is an OTC lidocaine patch that is just under prescription strength (4% versus 5%) that she may want to try as they would be cheaper. Let me know what she decided.

## 2016-03-17 NOTE — Telephone Encounter (Signed)
Ok to start PA -- will take a few days. Please let her know we will work on this ok to send in quantity 30.

## 2016-03-17 NOTE — Telephone Encounter (Signed)
Called Pt to inform her of thyroid Bx results. She wanted to know if she could have several Lidocaine patches sent to [facility] for her back pain. She has been seeing rehab for bilateral low back pain. Informed her Dr. Larose Kells is out of the office until sometime next week and I would send to covering provider. Pt verbalized understanding.

## 2016-03-20 DIAGNOSIS — M792 Neuralgia and neuritis, unspecified: Secondary | ICD-10-CM | POA: Diagnosis not present

## 2016-03-20 DIAGNOSIS — M961 Postlaminectomy syndrome, not elsewhere classified: Secondary | ICD-10-CM | POA: Diagnosis not present

## 2016-03-20 DIAGNOSIS — M5417 Radiculopathy, lumbosacral region: Secondary | ICD-10-CM | POA: Diagnosis not present

## 2016-03-20 DIAGNOSIS — M4806 Spinal stenosis, lumbar region: Secondary | ICD-10-CM | POA: Diagnosis not present

## 2016-03-22 NOTE — Telephone Encounter (Signed)
PA initiated on [email or web address] [employer] [place] - Rx #[id number]  Awaiting determination. JG//CMA

## 2016-03-27 NOTE — Telephone Encounter (Signed)
PA approved through 03/22/17 Approval letter sent for scanning

## 2016-04-03 ENCOUNTER — Ambulatory Visit: Payer: Medicare Other | Admitting: Physical Medicine & Rehabilitation

## 2016-04-03 ENCOUNTER — Ambulatory Visit: Payer: Medicare Other

## 2016-04-05 ENCOUNTER — Encounter (HOSPITAL_COMMUNITY): Payer: Self-pay | Admitting: Emergency Medicine

## 2016-04-05 ENCOUNTER — Emergency Department (HOSPITAL_COMMUNITY)
Admission: EM | Admit: 2016-04-05 | Discharge: 2016-04-05 | Disposition: A | Discharge status: Home / Self Care | Payer: Medicare Other | Attending: Emergency Medicine | Admitting: Emergency Medicine

## 2016-04-05 DIAGNOSIS — I10 Essential (primary) hypertension: Secondary | ICD-10-CM | POA: Insufficient documentation

## 2016-04-05 DIAGNOSIS — M79605 Pain in left leg: Secondary | ICD-10-CM | POA: Diagnosis not present

## 2016-04-05 DIAGNOSIS — Z79899 Other long term (current) drug therapy: Secondary | ICD-10-CM | POA: Insufficient documentation

## 2016-04-05 DIAGNOSIS — N289 Disorder of kidney and ureter, unspecified: Secondary | ICD-10-CM | POA: Diagnosis not present

## 2016-04-05 DIAGNOSIS — Z87891 Personal history of nicotine dependence: Secondary | ICD-10-CM | POA: Diagnosis not present

## 2016-04-05 DIAGNOSIS — Z7982 Long term (current) use of aspirin: Secondary | ICD-10-CM | POA: Insufficient documentation

## 2016-04-05 LAB — CK: Total CK: 124 U/L (ref 38–234)

## 2016-04-05 LAB — BASIC METABOLIC PANEL
Anion gap: 10 (ref 5–15)
BUN: 36 mg/dL — ABNORMAL HIGH (ref 6–20)
CO2: 23 mmol/L (ref 22–32)
Calcium: 9.6 mg/dL (ref 8.9–10.3)
Chloride: 99 mmol/L — ABNORMAL LOW (ref 101–111)
Creatinine, Ser: 1.31 mg/dL — ABNORMAL HIGH (ref 0.44–1.00)
GFR calc Af Amer: 45 mL/min — ABNORMAL LOW (ref >60)
GFR calc non Af Amer: 39 mL/min — ABNORMAL LOW (ref >60)
Glucose, Bld: 110 mg/dL — ABNORMAL HIGH (ref 65–99)
Potassium: 4.1 mmol/L (ref 3.5–5.1)
Sodium: 132 mmol/L — ABNORMAL LOW (ref 135–145)

## 2016-04-05 LAB — CBC WITH DIFFERENTIAL/PLATELET
Basophils Absolute: 0 10*3/uL (ref 0.0–0.1)
Basophils Relative: 0 %
Eosinophils Absolute: 0.2 10*3/uL (ref 0.0–0.7)
Eosinophils Relative: 2 %
HCT: 39.3 % (ref 36.0–46.0)
Hemoglobin: 12.9 g/dL (ref 12.0–15.0)
Lymphocytes Relative: 29 %
Lymphs Abs: 3.3 10*3/uL (ref 0.7–4.0)
MCH: 30.3 pg (ref 26.0–34.0)
MCHC: 32.8 g/dL (ref 30.0–36.0)
MCV: 92.3 fL (ref 78.0–100.0)
Monocytes Absolute: 1 10*3/uL (ref 0.1–1.0)
Monocytes Relative: 9 %
Neutro Abs: 6.7 10*3/uL (ref 1.7–7.7)
Neutrophils Relative %: 60 %
Platelets: 324 10*3/uL (ref 150–400)
RBC: 4.26 MIL/uL (ref 3.87–5.11)
RDW: 13.9 % (ref 11.5–15.5)
WBC: 11.3 10*3/uL — ABNORMAL HIGH (ref 4.0–10.5)

## 2016-04-05 LAB — I-STAT CG4 LACTIC ACID, ED: Lactic Acid, Venous: 1.48 mmol/L (ref 0.5–1.9)

## 2016-04-05 MED ORDER — ONDANSETRON HCL 4 MG/2ML IJ SOLN
4.0000 mg | Freq: Once | INTRAMUSCULAR | Status: AC
  Administered 2016-04-05: 4 mg via INTRAVENOUS
  Filled 2016-04-05: qty 2

## 2016-04-05 MED ORDER — HYDROMORPHONE HCL 1 MG/ML IJ SOLN
1.0000 mg | Freq: Once | INTRAMUSCULAR | Status: AC
  Administered 2016-04-05: 1 mg via INTRAVENOUS
  Filled 2016-04-05: qty 1

## 2016-04-05 MED ORDER — FENTANYL CITRATE (PF) 100 MCG/2ML IJ SOLN
50.0000 ug | INTRAMUSCULAR | Status: DC | PRN
  Filled 2016-04-05: qty 2

## 2016-04-05 NOTE — Discharge Instructions (Signed)
Please contact your pain management physician to see if adjustments in your narcotic dose might be warranted.  Your blood tests today showed slight increase in BUN and creatinine compared with last month. Please make sure to drink plenty of fluids. Your primary care provider should recheck these tests in 1-2 weeks to make sure that it is not getting worse.

## 2016-04-05 NOTE — ED Triage Notes (Signed)
Pt states she has chronic pain in both of her legs.  States she took 5 hydrocodone today with the last one being just before arrival. Pt tearful on assessment.  Sees orthopedic and pain management doctor regularly.  Pt states the pain is unbearable tonight.

## 2016-04-05 NOTE — ED Notes (Signed)
Pt also states she went to the gym today and used the bicycle and weights and walked the dogs.  Saw pain clinic last 2 weeks ago.

## 2016-04-05 NOTE — ED Provider Notes (Signed)
[facility] DEPT Provider Note   CSN: [id number] Arrival date & time: 04/05/16  0137  First Provider Contact: 04/05/2016 3:03 AM  By signing my name below, I, Georgette Shell, attest that this documentation has been prepared under the direction and in the presence of Delora Fuel, MD. Electronically Signed: Georgette Shell, ED Scribe. 04/05/16. 3:12 AM.    History   Chief Complaint Chief Complaint  Patient presents with  . Leg Pain   HPI Comments: [name] is a 75 y.o. female with h/o HTN, chronic back pain, neuropathy, and sciatica who presents to the Emergency Department complaining of sudden onset, gradually worsening, chronic pain in bilateral legs onset today. Pt states pain worsened at 8 pm. She notes that it feels like a heated curling iron is rolling down her bilateral legs. Pt reports she went to the gym today and lifted more weights than usual and walked her dogs. She has taken 5 hydrocodone today with no relief to the pain. Her last one was taken just PTA. Pt has an orthopedist and pain management doctor who she follows up with regularly. Pt denies fever.   The history is provided by the patient. No language interpreter was used.    Past Medical History:  Diagnosis Date  . Adenomatous colon polyp 03/2003  . Anxiety    ativan for sleep  . Baker's cyst   . Degenerative lumbar disc    Dr. Nelva Bush, epidural injections  . Goiter   . History of elevated homocysteine   . Hypertension 09/11/2005  . Lumbar radiculopathy    Dr. Nelva Bush, epidureal injections  . Lumbosacral radiculopathy    seeing Pain Management  . OA (osteoarthritis)     knee pain after TKR left , back pain- last steroid injection back 2 months ago Dr Nelva Bush  . PONV (postoperative nausea and vomiting)   . Postlaminectomy syndrome, lumbar region    Dr. Nelva Bush, recommended doing bilateral S1 transforaminal epidural with D5W  . Thyroid nodule    rt benign colloid nodule--left hyperplastic nodule- Thyroid bx 2006: neg     Patient Active Problem List   Diagnosis Date Noted  . Lumbar post-laminectomy syndrome 10/04/2015  . Chronic lumbar radiculopathy 10/04/2015  . PCP NOTES >>>>> 08/07/2015  . Allergic rhinitis 01/21/2015  . Leg edema 12/16/2013  . Weight loss 08/29/2012  . Lumbar canal stenosis 08/20/2012  . Annual physical exam 11/03/2011  . Heart murmur 11/03/2011  . Dermatitis 01/19/2011  . THYROID NODULE 03/15/2010  . PERSONAL HX COLONIC POLYPS 04/30/2008  . FATIGUE 04/17/2008  . ANXIETY- Insomnia 01/27/2008  . Osteoarthritis 01/27/2008  . BAKER'S CYST 12/20/2006  . Essential hypertension 09/11/2005    Past Surgical History:  Procedure Laterality Date  . ABDOMINAL HYSTERECTOMY    . BACK SURGERY  11-09-2011   at [facility], lumbar  . bakers cystectomy     right knee  . FACELIFT    . INCISIONAL HERNIA REPAIR    . KNEE ARTHROSCOPY     L   replacement 2007, Dr Corinne Ports, then a manipulation, then redo TKR 11/09 w/ Dr.Alusio. Parcial replacement again 09-2010  . KNEE SURGERY     x 5-  left  . OOPHORECTOMY    . SHOULDER SURGERY     replacements: L 2010, R 2012  . TOTAL HIP ARTHROPLASTY  3/11   Right   . TOTAL KNEE ARTHROPLASTY  05/20/2012   Procedure: TOTAL KNEE ARTHROPLASTY;  Surgeon: Gearlean Alf, MD;  Location: [facility];  Service: Orthopedics;  Laterality: Right;    OB History    No data available       Home Medications    Prior to Admission medications   Medication Sig Start Date End Date Taking? Authorizing Provider  Acetaminophen (TYLENOL PO) Take by mouth as needed.    Historical Provider, MD  aspirin 81 MG tablet Take 81 mg by mouth daily.    Historical Provider, MD  Calcium Carbonate-Vitamin D (CALCIUM + D PO) Take 600 mg by mouth daily after breakfast.    Historical Provider, MD  folic acid (FOLVITE) 1 MG tablet Take 2 tablets (2 mg total) by mouth daily. 02/24/16   Colon Branch, MD  gabapentin (NEURONTIN) 800 MG tablet Take 1 tablet (800 mg total) by mouth 4 (four) times daily.  02/10/16   Colon Branch, MD  HYDROcodone-acetaminophen (NORCO/VICODIN) 5-325 MG tablet Take 1 tablet by mouth 2 (two) times daily as needed. 01/05/16   Historical Provider, MD  hydrocortisone 2.5 % cream  05/05/14   Historical Provider, MD  lidocaine (LIDODERM) 5 % Place 1 patch onto the skin daily. Remove & Discard patch within 12 hours or as directed by MD 03/17/16   Brunetta Jeans, PA-C  lisinopril-hydrochlorothiazide (PRINZIDE,ZESTORETIC) 20-12.5 MG tablet Take 1 tablet by mouth daily. 02/10/16   Colon Branch, MD  LORazepam (ATIVAN) 0.5 MG tablet Take 1 tablet (0.5 mg total) by mouth at bedtime as needed for sleep. 01/21/16   Colon Branch, MD  simvastatin (ZOCOR) 20 MG tablet Take 1 tablet (20 mg total) by mouth daily. 09/27/15   Colon Branch, MD  tretinoin (RETIN-A) 0.1 % cream  06/11/15   Historical Provider, MD    Family History Family History  Problem Relation Age of Onset  . Coronary artery disease Father     CABG at 36  . Prostate cancer Father   . Colon polyps Father   . Coronary artery disease Brother   . Colon polyps Brother   . Breast cancer Neg Hx   . Colon cancer Neg Hx   . Pancreatic cancer Neg Hx   . Stomach cancer Neg Hx     Social History Social History  Substance Use Topics  . Smoking status: Former Smoker    Quit date: 09/12/1983  . Smokeless tobacco: Never Used  . Alcohol use 0.0 oz/week     Comment: occasional      Allergies   Tramadol and Oxycodone-acetaminophen   Review of Systems Review of Systems  Constitutional: Negative for fever.  HENT: Negative for rhinorrhea.   Cardiovascular: Positive for leg swelling.  Gastrointestinal: Negative for vomiting.  Musculoskeletal: Positive for arthralgias.     Physical Exam Updated Vital Signs BP 190/72 (BP Location: Right Arm)   Pulse 63   Temp 97.6 F (36.4 C) (Oral)   Resp 24   SpO2 100%   Physical Exam  Constitutional: She is oriented to person, place, and time. She appears well-developed and well-nourished.   Uncomfortable appearing, restless.  HENT:  Head: Normocephalic and atraumatic.  Eyes: Conjunctivae and EOM are normal. Pupils are equal, round, and reactive to light.  Neck: Normal range of motion. Neck supple. No JVD present.  Cardiovascular: Normal rate, regular rhythm, normal heart sounds and intact distal pulses.   No murmur heard. Pulmonary/Chest: Effort normal. No respiratory distress. She has no wheezes. She exhibits no tenderness.  Abdominal: Soft. Bowel sounds are normal. She exhibits no distension and no mass. There is no tenderness.  Musculoskeletal: Normal  range of motion. She exhibits edema and tenderness.  Trace edema with moderate venous stasis changes. Left leg very tender throughout even to light touch. Left leg is warm to the touch with prompt capillary refill.   Lymphadenopathy:    She has no cervical adenopathy.  Neurological: She is alert and oriented to person, place, and time. No cranial nerve deficit. She exhibits normal muscle tone. Coordination normal.  Skin: Skin is warm and dry. Capillary refill takes less than 2 seconds.  Psychiatric: She has a normal mood and affect. Her behavior is normal.  Nursing note and vitals reviewed.    ED Treatments / Results  DIAGNOSTIC STUDIES: Oxygen Saturation is 100% on RA, normal by my interpretation.    COORDINATION OF CARE: 3:03 AM Discussed treatment plan with pt at bedside which includes IV fluids and lab work and pt agreed to plan.  Labs (all labs ordered are listed, but only abnormal results are displayed) Labs Reviewed  CBC WITH DIFFERENTIAL/PLATELET - Abnormal; Notable for the following:       Result Value   WBC 11.3 (*)    All other components within normal limits  BASIC METABOLIC PANEL - Abnormal; Notable for the following:    Sodium 132 (*)    Chloride 99 (*)    Glucose, Bld 110 (*)    BUN 36 (*)    Creatinine, Ser 1.31 (*)    GFR calc non Af Amer 39 (*)    GFR calc Af Amer 45 (*)    All other  components within normal limits  CK  I-STAT CG4 LACTIC ACID, ED    Procedures Procedures (including critical care time)  Medications Ordered in ED Medications  HYDROmorphone (DILAUDID) injection 1 mg (1 mg Intravenous Given 04/05/16 0318)  ondansetron (ZOFRAN) injection 4 mg (4 mg Intravenous Given 04/05/16 0315)     Initial Impression / Assessment and Plan / ED Course  I have reviewed the triage vital signs and the nursing notes.  Pertinent labs that were available during my care of the patient were reviewed by me and considered in my medical decision making (see chart for details).  Clinical Course    Leg pain in pattern that is consistent with neuropathic pain. No evidence of acute neurologic injury. She is given a dose of hydromorphone with good relief of pain. Screening labs were checked including CK and were significant for mild elevation of BUN and creatinine compared with baseline. Patient is advised to drink plenty of fluids and have BUN and creatinine rechecked in 1-2 weeks. She is to contact her pain management physician to see if her dose of hydrocodone needs to be adjusted. Patient expresses understanding.  Final Clinical Impressions(s) / ED Diagnoses   Final diagnoses:  Leg pain, diffuse, left  Renal insufficiency    New Prescriptions New Prescriptions   No medications on file  I personally performed the services described in this documentation, which was scribed in my presence. The recorded information has been reviewed and is accurate.       Delora Fuel, MD [id number]

## 2016-04-05 NOTE — ED Notes (Signed)
Bed: [id number] Expected date:  Expected time:  Means of arrival:  Comments: For Res A

## 2016-04-05 NOTE — ED Notes (Signed)
Pt got dressed independently and began walking out of her room with her belongings.  Pt stated she was ready to go.  Dr. Roxanne Mins notified and stated he will get her discharge paper together.  Pt now pacing the hallway.

## 2016-04-28 ENCOUNTER — Telehealth: Payer: Self-pay | Admitting: Internal Medicine

## 2016-04-28 NOTE — Telephone Encounter (Signed)
Relation to [id number] Call back [place]:  [facility] [place], [address] [phone] (Phone) [phone] (Fax)     Reason for call:  Patient requesting a refill gabapentin (NEURONTIN) 800 MG tablet

## 2016-04-28 NOTE — Telephone Encounter (Signed)
Medication Detail    Disp Refills Start End   gabapentin (NEURONTIN) 800 MG tablet 360 tablet 2 02/10/2016    Sig - Route: Take 1 tablet (800 mg total) by mouth 4 (four) times daily. - Oral   E-Prescribing Status: Receipt confirmed by pharmacy (02/10/2016 9:58 AM EDT)    Rx was sent for 90 day supply and 2 refills on 02/10/2016.

## 2016-05-04 ENCOUNTER — Ambulatory Visit (HOSPITAL_BASED_OUTPATIENT_CLINIC_OR_DEPARTMENT_OTHER)
Admission: RE | Admit: 2016-05-04 | Discharge: 2016-05-04 | Disposition: A | Discharge status: Home / Self Care | Payer: Medicare Other | Source: Ambulatory Visit | Attending: Medical | Admitting: Medical

## 2016-05-04 ENCOUNTER — Encounter: Payer: Self-pay | Admitting: Medical

## 2016-05-04 ENCOUNTER — Ambulatory Visit (INDEPENDENT_AMBULATORY_CARE_PROVIDER_SITE_OTHER): Payer: Medicare Other | Admitting: Medical

## 2016-05-04 VITALS — BP 119/47 | HR 61 | Temp 98.7°F | Ht 65.0 in | Wt 174.0 lb

## 2016-05-04 DIAGNOSIS — M79602 Pain in left arm: Secondary | ICD-10-CM | POA: Insufficient documentation

## 2016-05-04 DIAGNOSIS — W19XXXA Unspecified fall, initial encounter: Secondary | ICD-10-CM | POA: Insufficient documentation

## 2016-05-04 DIAGNOSIS — R2242 Localized swelling, mass and lump, left lower limb: Secondary | ICD-10-CM | POA: Insufficient documentation

## 2016-05-04 DIAGNOSIS — M25572 Pain in left ankle and joints of left foot: Secondary | ICD-10-CM

## 2016-05-04 DIAGNOSIS — M79605 Pain in left leg: Secondary | ICD-10-CM

## 2016-05-04 DIAGNOSIS — T148XXA Other injury of unspecified body region, initial encounter: Secondary | ICD-10-CM

## 2016-05-04 DIAGNOSIS — T148 Other injury of unspecified body region: Secondary | ICD-10-CM

## 2016-05-04 DIAGNOSIS — M7989 Other specified soft tissue disorders: Secondary | ICD-10-CM | POA: Diagnosis not present

## 2016-05-04 DIAGNOSIS — M79662 Pain in left lower leg: Secondary | ICD-10-CM | POA: Diagnosis not present

## 2016-05-04 NOTE — Patient Instructions (Signed)
For your area of pain will get xray of tibia and ankle.  Also will get lower ext [place] to make sure no DVT present post fall. This is scheduled at 5:30 pm. We will call you late today or tomorrow morning.  For your pain continue your current regimen of you chronic pain/neuropathy.  Follow up date to be determined. Date will depend on study results.

## 2016-05-04 NOTE — Progress Notes (Signed)
Subjective:    Patient ID: [name], female    DOB: 1941/02/21, 75 y.o.   MRN: [id number]  HPI  Pt in with some left ankle swelling. Pt fell off porch about 2 weeks ago. She landed on dirt. She did not gt evaluated. Pt got a bruise to her medial upper calf and her upper pretibial area.   Pt not having any knee pain left side.  She notes some left ankle pain and swelling.    Review of Systems  Constitutional: Negative for chills, fatigue and fever.  Respiratory: Negative for cough, chest tightness, shortness of breath and wheezing.   Cardiovascular: Negative for chest pain and palpitations.  Musculoskeletal:       Left tibia, calf and ankle area pain.  Lt knee no new pain.  Hematological: Bruises/bleeds easily.       Only some from fall. Does not bruise easily.    Past Medical History:  Diagnosis Date  . Adenomatous colon polyp 03/2003  . Anxiety    ativan for sleep  . Baker's cyst   . Degenerative lumbar disc    Dr. Nelva Bush, epidural injections  . Goiter   . History of elevated homocysteine   . Hypertension 09/11/2005  . Lumbar radiculopathy    Dr. Nelva Bush, epidureal injections  . Lumbosacral radiculopathy    seeing Pain Management  . OA (osteoarthritis)     knee pain after TKR left , back pain- last steroid injection back 2 months ago Dr Nelva Bush  . PONV (postoperative nausea and vomiting)   . Postlaminectomy syndrome, lumbar region    Dr. Nelva Bush, recommended doing bilateral S1 transforaminal epidural with D5W  . Thyroid nodule    rt benign colloid nodule--left hyperplastic nodule- Thyroid bx 2006: neg     Social History   Social History  . Marital status: Married    Spouse name: N/A  . Number of children: 2  . Years of education: N/A   Occupational History  . Retired Pharmacist, hospital     retired  .  Retired   Social History Main Topics  . Smoking status: Former Smoker    Quit date: 09/12/1983  . Smokeless tobacco: Never Used  . Alcohol use 0.0 oz/week   Comment: occasional   . Drug use: No  . Sexual activity: Not on file   Other Topics Concern  . Not on file   Social History Narrative   Lives w/ husband of 12 years in a 3 story home. Has 2 sons.  Retired Education officer, museum.  Education: college.             Past Surgical History:  Procedure Laterality Date  . ABDOMINAL HYSTERECTOMY    . BACK SURGERY  11-09-2011   at [facility], lumbar  . bakers cystectomy     right knee  . FACELIFT    . INCISIONAL HERNIA REPAIR    . KNEE ARTHROSCOPY     L   replacement 2007, Dr Corinne Ports, then a manipulation, then redo TKR 11/09 w/ Dr.Alusio. Parcial replacement again 09-2010  . KNEE SURGERY     x 5-  left  . OOPHORECTOMY    . SHOULDER SURGERY     replacements: L 2010, R 2012  . TOTAL HIP ARTHROPLASTY  3/11   Right   . TOTAL KNEE ARTHROPLASTY  05/20/2012   Procedure: TOTAL KNEE ARTHROPLASTY;  Surgeon: Gearlean Alf, MD;  Location: [facility];  Service: Orthopedics;  Laterality: Right;    Family History  Problem  Relation Age of Onset  . Coronary artery disease Father     CABG at 73  . Prostate cancer Father   . Colon polyps Father   . Coronary artery disease Brother   . Colon polyps Brother   . Breast cancer Neg Hx   . Colon cancer Neg Hx   . Pancreatic cancer Neg Hx   . Stomach cancer Neg Hx     Allergies  Allergen Reactions  . Tramadol Hives and Swelling    Per Pt she can take w/o reactions  . Oxycodone-Acetaminophen Nausea Only and Other (See Comments)    Headaches    Current Outpatient Prescriptions on File Prior to Visit  Medication Sig Dispense Refill  . Acetaminophen (TYLENOL PO) Take by mouth as needed.    Marland Kitchen aspirin 81 MG tablet Take 81 mg by mouth daily.    . Calcium Carbonate-Vitamin D (CALCIUM + D PO) Take 600 mg by mouth daily after breakfast.    . folic acid (FOLVITE) 1 MG tablet Take 2 tablets (2 mg total) by mouth daily. 60 tablet 12  . gabapentin (NEURONTIN) 800 MG tablet Take 1 tablet (800 mg total) by mouth 4 (four) times  daily. 360 tablet 2  . HYDROcodone-acetaminophen (NORCO/VICODIN) 5-325 MG tablet Take 1 tablet by mouth 2 (two) times daily as needed.    . hydrocortisone 2.5 % cream     . lidocaine (LIDODERM) 5 % Place 1 patch onto the skin daily. Remove & Discard patch within 12 hours or as directed by MD 30 patch 0  . lisinopril-hydrochlorothiazide (PRINZIDE,ZESTORETIC) 20-12.5 MG tablet Take 1 tablet by mouth daily. 90 tablet 2  . LORazepam (ATIVAN) 0.5 MG tablet Take 1 tablet (0.5 mg total) by mouth at bedtime as needed for sleep. 30 tablet 1  . simvastatin (ZOCOR) 20 MG tablet Take 1 tablet (20 mg total) by mouth daily. 30 tablet 8  . tretinoin (RETIN-A) 0.1 % cream   4   No current facility-administered medications on file prior to visit.     BP (!) 119/47   Pulse 61   Temp 98.7 F (37.1 C) (Oral)   Ht 5\' 5"  (1.651 m)   Wt 174 lb (78.9 kg)   SpO2 99%   BMI 28.96 kg/m       Objective:   Physical Exam  General- No acute distress. Pleasant patient.  Lungs- Clear, even and unlabored. Heart- regular rate and rhythm. Neurologic- CNII- XII grossly intact.  Lt knee- mid ine scar. No obvious pain on palpation. Good rom.  Lt upper lower ext- pretibial area moderate bruise and tender Medial calf bruised and tender.(neg homan sign)  Lt ankle- mild swollen and pain on palpation of lateral aspect. Lt foot- non-tender to palpation.      Assessment & Plan:  For your area of pain will get xray of tibia and ankle.  Also will get lower ext [place] to make sure no DVT present post fall. This is scheduled at 5:30 pm. We will call you late today or tomorrow morning.  For your pain continue your current regimen of you chronic pain/neuropathy.  Follow up date to be determined. Date will depend on study results.  Tyronza Happe, Percell Miller, PA-C

## 2016-05-04 NOTE — Progress Notes (Signed)
Pre visit review using our clinic tool,if applicable. No additional management support is needed unless otherwise documented below in the visit note.  

## 2016-05-11 ENCOUNTER — Other Ambulatory Visit: Payer: Self-pay | Admitting: Internal Medicine

## 2016-05-11 DIAGNOSIS — Z23 Encounter for immunization: Secondary | ICD-10-CM | POA: Diagnosis not present

## 2016-05-11 NOTE — Telephone Encounter (Signed)
Okay 30 and 2 refills 

## 2016-05-11 NOTE — Telephone Encounter (Signed)
Rx printed, awaiting MD signature.  

## 2016-05-11 NOTE — Telephone Encounter (Signed)
Pt is requesting refill on Lorazepam.  Last OV: 02/10/2016 Last Fill: 01/21/2016 #30 and 1RF UDS: None  Please advise.

## 2016-05-11 NOTE — Telephone Encounter (Signed)
Rx faxed to [facility] pharmacy.  

## 2016-05-12 DIAGNOSIS — M5417 Radiculopathy, lumbosacral region: Secondary | ICD-10-CM | POA: Diagnosis not present

## 2016-05-12 DIAGNOSIS — G894 Chronic pain syndrome: Secondary | ICD-10-CM | POA: Diagnosis not present

## 2016-05-12 DIAGNOSIS — M961 Postlaminectomy syndrome, not elsewhere classified: Secondary | ICD-10-CM | POA: Diagnosis not present

## 2016-05-25 ENCOUNTER — Other Ambulatory Visit: Payer: Self-pay | Admitting: Physician Assistant

## 2016-06-07 ENCOUNTER — Other Ambulatory Visit: Payer: Self-pay | Admitting: Internal Medicine

## 2016-06-13 DIAGNOSIS — M47817 Spondylosis without myelopathy or radiculopathy, lumbosacral region: Secondary | ICD-10-CM | POA: Diagnosis not present

## 2016-06-20 DIAGNOSIS — Z5181 Encounter for therapeutic drug level monitoring: Secondary | ICD-10-CM | POA: Diagnosis not present

## 2016-06-20 DIAGNOSIS — M533 Sacrococcygeal disorders, not elsewhere classified: Secondary | ICD-10-CM | POA: Diagnosis not present

## 2016-06-20 DIAGNOSIS — Z79899 Other long term (current) drug therapy: Secondary | ICD-10-CM | POA: Diagnosis not present

## 2016-06-20 DIAGNOSIS — M791 Myalgia: Secondary | ICD-10-CM | POA: Diagnosis not present

## 2016-06-20 DIAGNOSIS — G609 Hereditary and idiopathic neuropathy, unspecified: Secondary | ICD-10-CM | POA: Diagnosis not present

## 2016-06-20 DIAGNOSIS — G894 Chronic pain syndrome: Secondary | ICD-10-CM | POA: Diagnosis not present

## 2016-06-22 ENCOUNTER — Ambulatory Visit: Payer: Medicare Other | Admitting: Physical Therapy

## 2016-07-04 ENCOUNTER — Ambulatory Visit: Payer: Medicare Other | Admitting: Physical Therapy

## 2016-07-04 ENCOUNTER — Encounter: Payer: Self-pay | Admitting: Physical Therapy

## 2016-07-04 ENCOUNTER — Ambulatory Visit: Payer: Medicare Other | Attending: Anesthesiology | Admitting: Physical Therapy

## 2016-07-04 DIAGNOSIS — M5442 Lumbago with sciatica, left side: Secondary | ICD-10-CM | POA: Diagnosis not present

## 2016-07-04 DIAGNOSIS — M62830 Muscle spasm of back: Secondary | ICD-10-CM

## 2016-07-04 DIAGNOSIS — G8929 Other chronic pain: Secondary | ICD-10-CM | POA: Diagnosis not present

## 2016-07-04 DIAGNOSIS — R262 Difficulty in walking, not elsewhere classified: Secondary | ICD-10-CM | POA: Diagnosis not present

## 2016-07-04 NOTE — Therapy (Signed)
[place], [place], [address] Phone: [phone]   Fax:  [phone]  Physical Therapy Evaluation  Patient Details  Name: [name] MRN: [id number] Date of Birth: 10-09-1940 Referring Provider: c. Mauricia Area  Encounter Date: 07/04/2016      PT End of Session - 07/04/16 1556    Visit Number 1   Number of Visits 18   Date for PT Re-Evaluation 09/03/16   PT Start Time [id number]   PT Stop Time 1610   PT Time Calculation (min) 54 min   Activity Tolerance Patient tolerated treatment well   Behavior During Therapy [facility] for tasks assessed/performed      Past Medical History:  Diagnosis Date  . Adenomatous colon polyp 03/2003  . Anxiety    ativan for sleep  . Baker's cyst   . Degenerative lumbar disc    Dr. Nelva Bush, epidural injections  . Goiter   . History of elevated homocysteine   . Hypertension 09/11/2005  . Lumbar radiculopathy    Dr. Nelva Bush, epidureal injections  . Lumbosacral radiculopathy    seeing Pain Management  . OA (osteoarthritis)     knee pain after TKR left , back pain- last steroid injection back 2 months ago Dr Nelva Bush  . PONV (postoperative nausea and vomiting)   . Postlaminectomy syndrome, lumbar region    Dr. Nelva Bush, recommended doing bilateral S1 transforaminal epidural with D5W  . Thyroid nodule    rt benign colloid nodule--left hyperplastic nodule- Thyroid bx 2006: neg    Past Surgical History:  Procedure Laterality Date  . ABDOMINAL HYSTERECTOMY    . BACK SURGERY  11-09-2011   at [facility], lumbar  . bakers cystectomy     right knee  . FACELIFT    . INCISIONAL HERNIA REPAIR    . KNEE ARTHROSCOPY     L   replacement 2007, Dr Corinne Ports, then a manipulation, then redo TKR 11/09 w/ Dr.Alusio. Parcial replacement again 09-2010  . KNEE SURGERY     x 5-  left  . OOPHORECTOMY    . SHOULDER SURGERY     replacements: L 2010, R 2012  . TOTAL HIP ARTHROPLASTY  3/11   Right   . TOTAL  KNEE ARTHROPLASTY  05/20/2012   Procedure: TOTAL KNEE ARTHROPLASTY;  Surgeon: Gearlean Alf, MD;  Location: [facility];  Service: Orthopedics;  Laterality: Right;    There were no vitals filed for this visit.       Subjective Assessment - 07/04/16 1527    Subjective Patient underwent lumbar surgery in 2013 at [facility], fusion of the lumbar spine.  She reports that her pain never really subsided, she has had nerve ablation, a trial of stimulator implant, numerous injections all with minimal to no changes.  Over the past 6 months she reports increased back pain and left leg issues   Limitations Walking;Standing;House hold activities   Patient Stated Goals have less pain, move better   Currently in Pain? Yes   Pain Score 4    Pain Location Back   Pain Orientation Left;Lower   Pain Descriptors / Indicators Aching;Numbness;Stabbing   Pain Type Chronic pain   Pain Radiating Towards left hip, lateral thigh and to the big toe   Pain Onset More than a month ago   Pain Frequency Constant   Aggravating Factors  activity, walk dog pain will be 8/10 , standing 5 minutes and has to change positions due to pain an 8-9/10  Pain Relieving Factors rest, 4/10 is as good as it gets   Effect of Pain on Daily Activities tolerate life better, have less pain            [facility] PT Assessment - 07/04/16 0001      Assessment   Medical Diagnosis LBP, left sciatica   Referring Provider c. Custer   Onset Date/Surgical Date 07/05/15   Prior Therapy earlier int he year, with minimal relief     Precautions   Precautions None     Balance Screen   Has the patient fallen in the past 6 months No   Has the patient had a decrease in activity level because of a fear of falling?  No   Is the patient reluctant to leave their home because of a fear of falling?  No     Home Environment   Additional Comments does some housework and yardwork, has a chair lift for the stairs     Prior Function   Level of Independence  Independent   Vocation Retired   Leisure walks dog daily, still doing some yardwork and gardening     Posture/Postural Control   Posture Comments fwd head, gaurded posture     AROM   Overall AROM Comments Lumbar ROM was decreased 50% for flexion, decreased 100% for extension and side bending all increased pain in the left low back     Strength   Overall Strength Comments left hip 4-/5 with pain, right  4/5 for the right hip , knees 4/5, left DF was 4-/5 with pain     Flexibility   Soft Tissue Assessment /Muscle Length --  very tight and painful quad and hip flexor, HS, ITB     Palpation   Palpation comment very tender in the left SI, the left lateral thigh     Ambulation/Gait   Gait Comments no device, slight forward flexed trunk, small steps                   [facility] Adult PT Treatment/Exercise - 07/04/16 0001      Moist Heat Therapy   Number Minutes Moist Heat 15 Minutes   Moist Heat Location Lumbar Spine     Electrical Stimulation   Electrical Stimulation Location left SI and left lateral thigh area   Electrical Stimulation Action IFC   Electrical Stimulation Parameters supine   Electrical Stimulation Goals Pain                  PT Short Term Goals - 07/04/16 1600      PT SHORT TERM GOAL #1   Title independent with initial HEP   Time 2   Period Weeks   Status New           PT Long Term Goals - 07/04/16 1600      PT LONG TERM GOAL #1   Title understand proper posture and body mechanics instruction   Time 8   Period Weeks   Status New     PT LONG TERM GOAL #2   Title increase lumbar ROM 25%   Time 8   Period Weeks   Status New     PT LONG TERM GOAL #3   Title report 25% decrease in pain   Time 8   Period Weeks   Status New     PT LONG TERM GOAL #4   Title increase LE strength to 4/5   Time 8   Period Weeks  Status New     PT LONG TERM GOAL #5   Title report pain after walking dog 4/10   Time 8   Period Weeks    Status New               Plan - 2016/07/13 1557    Clinical Impression Statement Patient with long standing LBP, prior to a lumbar fusion in 2013.  She reports recent onset of left sciatica type pain with some increased numbness in the left thigh and into the left big toe.  She has had numerous injection, nerve ablation, different pain meds and a trial of implant for pain all with minimal changes in pain, she had PT with dry needling earlier in the year without benefit, she is very tight in the LE, especially the quad and the hip flexor of the left thigh   Rehab Potential Good   PT Frequency 2x / week   PT Duration 8 weeks   PT Next Visit Plan try to slowly do LE flexibility, could try STM of the left ITB and quad, she has a history of doing too much at home and doing some things in the gym that are not good for her   Consulted and Agree with Plan of Care Patient      Patient will benefit from skilled therapeutic intervention in order to improve the following deficits and impairments:  Abnormal gait, Cardiopulmonary status limiting activity, Decreased activity tolerance, Decreased balance, Decreased range of motion, Decreased mobility, Decreased strength, Difficulty walking, Impaired flexibility, Increased muscle spasms, Postural dysfunction, Pain, Improper body mechanics  Visit Diagnosis: Chronic left-sided low back pain with left-sided sciatica  Muscle spasm of back  Difficulty in walking, not elsewhere classified      G-Codes - 2016/07/13 1601    Functional Assessment Tool Used foto 62% limitation   Functional Limitation Mobility: Walking and moving around   Mobility: Walking and Moving Around Current Status [phone]) At least 60 percent but less than 80 percent impaired, limited or restricted   Mobility: Walking and Moving Around Goal Status [phone]) At least 40 percent but less than 60 percent impaired, limited or restricted       Problem List Patient Active Problem List    Diagnosis Date Noted  . Lumbar post-laminectomy syndrome 10/04/2015  . Chronic lumbar radiculopathy 10/04/2015  . PCP NOTES >>>>> 08/07/2015  . Allergic rhinitis 01/21/2015  . Leg edema 12/16/2013  . Weight loss 08/29/2012  . Lumbar canal stenosis 08/20/2012  . Annual physical exam 11/03/2011  . Heart murmur 11/03/2011  . Dermatitis 01/19/2011  . THYROID NODULE 03/15/2010  . PERSONAL HX COLONIC POLYPS 04/30/2008  . FATIGUE 04/17/2008  . ANXIETY- Insomnia 01/27/2008  . Osteoarthritis 01/27/2008  . BAKER'S CYST 12/20/2006  . Essential hypertension 09/11/2005    Sumner Boast., PT 07/13/2016, 4:24 PM  [place], [place], [address] Phone: [phone]   Fax:  [phone]  Name: [name] MRN: [id number] Date of Birth: [birth year]

## 2016-07-11 ENCOUNTER — Encounter: Payer: Self-pay | Admitting: Physical Therapy

## 2016-07-11 ENCOUNTER — Ambulatory Visit: Payer: Medicare Other | Admitting: Physical Therapy

## 2016-07-11 DIAGNOSIS — R262 Difficulty in walking, not elsewhere classified: Secondary | ICD-10-CM

## 2016-07-11 DIAGNOSIS — G8929 Other chronic pain: Secondary | ICD-10-CM | POA: Diagnosis not present

## 2016-07-11 DIAGNOSIS — M5442 Lumbago with sciatica, left side: Principal | ICD-10-CM

## 2016-07-11 DIAGNOSIS — M62830 Muscle spasm of back: Secondary | ICD-10-CM

## 2016-07-11 NOTE — Therapy (Signed)
[place], [place], [address] Phone: [phone]   Fax:  [phone]  Physical Therapy Evaluation  Patient Details  Name: [name] MRN: [id number] Date of Birth: [birth year] Referring Provider: c. Mauricia Area  Encounter Date: 07/11/2016      PT End of Session - 07/11/16 1602    Visit Number 2   Number of Visits 18   Date for PT Re-Evaluation 09/03/16   PT Start Time 1526   PT Stop Time [id number]   PT Time Calculation (min) 49 min      Past Medical History:  Diagnosis Date  . Adenomatous colon polyp 03/2003  . Anxiety    ativan for sleep  . Baker's cyst   . Degenerative lumbar disc    Dr. Nelva Bush, epidural injections  . Goiter   . History of elevated homocysteine   . Hypertension 09/11/2005  . Lumbar radiculopathy    Dr. Nelva Bush, epidureal injections  . Lumbosacral radiculopathy    seeing Pain Management  . OA (osteoarthritis)     knee pain after TKR left , back pain- last steroid injection back 2 months ago Dr Nelva Bush  . PONV (postoperative nausea and vomiting)   . Postlaminectomy syndrome, lumbar region    Dr. Nelva Bush, recommended doing bilateral S1 transforaminal epidural with D5W  . Thyroid nodule    rt benign colloid nodule--left hyperplastic nodule- Thyroid bx 2006: neg    Past Surgical History:  Procedure Laterality Date  . ABDOMINAL HYSTERECTOMY    . BACK SURGERY  11-09-2011   at [facility], lumbar  . bakers cystectomy     right knee  . FACELIFT    . INCISIONAL HERNIA REPAIR    . KNEE ARTHROSCOPY     L   replacement 2007, Dr Corinne Ports, then a manipulation, then redo TKR 11/09 w/ Dr.Alusio. Parcial replacement again 09-2010  . KNEE SURGERY     x 5-  left  . OOPHORECTOMY    . SHOULDER SURGERY     replacements: L 2010, R 2012  . TOTAL HIP ARTHROPLASTY  3/11   Right   . TOTAL KNEE ARTHROPLASTY  05/20/2012   Procedure: TOTAL KNEE ARTHROPLASTY;  Surgeon: Gearlean Alf, MD;  Location: [facility];   Service: Orthopedics;  Laterality: Right;    There were no vitals filed for this visit.       Subjective Assessment - 07/11/16 1557    Subjective nerve pain is just so bad   Currently in Pain? Yes   Pain Score 7    Pain Location Leg                       [facility] Adult PT Treatment/Exercise - 07/11/16 0001      Lumbar Exercises: Supine   Ab Set 15 reps   Bent Knee Raise 10 reps   Straight Leg Raise 10 reps   Large Ball Abdominal Isometric 10 reps   Other Supine Lumbar Exercises isometric hip abd and add 10 each     Moist Heat Therapy   Number Minutes Moist Heat 15 Minutes   Moist Heat Location Lumbar Spine     Manual Therapy   Manual Therapy Soft tissue mobilization;Internal Pelvic Floor;Neural Stretch   Manual therapy comments PROM LE and TRUNK   Soft tissue mobilization quads,calf and IT with foam roller   Neural Stretch LE  PT Short Term Goals - 07/04/16 1600      PT SHORT TERM GOAL #1   Title independent with initial HEP   Time 2   Period Weeks   Status New           PT Long Term Goals - 07/04/16 1600      PT LONG TERM GOAL #1   Title understand proper posture and body mechanics instruction   Time 8   Period Weeks   Status New     PT LONG TERM GOAL #2   Title increase lumbar ROM 25%   Time 8   Period Weeks   Status New     PT LONG TERM GOAL #3   Title report 25% decrease in pain   Time 8   Period Weeks   Status New     PT LONG TERM GOAL #4   Title increase LE strength to 4/5   Time 8   Period Weeks   Status New     PT LONG TERM GOAL #5   Title report pain after walking dog 4/10   Time 8   Period Weeks   Status New               Plan - 07/11/16 1604    Clinical Impression Statement pt very tight in LE and trunk with postive NT. initiated core stab ex with tactile and verb cuing. discussed and educ pt on limiting home activities to avoid flaring up.   PT Next Visit Plan try to slowly do  LE flexibility, could try STM of the left ITB and quad      Patient will benefit from skilled therapeutic intervention in order to improve the following deficits and impairments:  Abnormal gait, Cardiopulmonary status limiting activity, Decreased activity tolerance, Decreased balance, Decreased range of motion, Decreased mobility, Decreased strength, Difficulty walking, Impaired flexibility, Increased muscle spasms, Postural dysfunction, Pain, Improper body mechanics  Visit Diagnosis: Chronic left-sided low back pain with left-sided sciatica  Muscle spasm of back  Difficulty in walking, not elsewhere classified     Problem List Patient Active Problem List   Diagnosis Date Noted  . Lumbar post-laminectomy syndrome 10/04/2015  . Chronic lumbar radiculopathy 10/04/2015  . PCP NOTES >>>>> 08/07/2015  . Allergic rhinitis 01/21/2015  . Leg edema 12/16/2013  . Weight loss 08/29/2012  . Lumbar canal stenosis 08/20/2012  . Annual physical exam 11/03/2011  . Heart murmur 11/03/2011  . Dermatitis 01/19/2011  . THYROID NODULE 03/15/2010  . PERSONAL HX COLONIC POLYPS 04/30/2008  . FATIGUE 04/17/2008  . ANXIETY- Insomnia 01/27/2008  . Osteoarthritis 01/27/2008  . BAKER'S CYST 12/20/2006  . Essential hypertension 09/11/2005    PAYSEUR,ANGIE PTA 07/11/2016, 4:09 PM  [place], [place], [address] Phone: [phone]   Fax:  [phone]  Name: [name] MRN: [id number] Date of Birth: 05-14-1941

## 2016-07-18 ENCOUNTER — Ambulatory Visit: Payer: Medicare Other | Admitting: Physical Therapy

## 2016-07-20 ENCOUNTER — Ambulatory Visit: Payer: Medicare Other | Attending: Anesthesiology | Admitting: Physical Therapy

## 2016-07-20 ENCOUNTER — Encounter: Payer: Self-pay | Admitting: Physical Therapy

## 2016-07-20 DIAGNOSIS — M5442 Lumbago with sciatica, left side: Secondary | ICD-10-CM | POA: Insufficient documentation

## 2016-07-20 DIAGNOSIS — R262 Difficulty in walking, not elsewhere classified: Secondary | ICD-10-CM | POA: Diagnosis not present

## 2016-07-20 DIAGNOSIS — M62830 Muscle spasm of back: Secondary | ICD-10-CM | POA: Diagnosis not present

## 2016-07-20 DIAGNOSIS — G8929 Other chronic pain: Secondary | ICD-10-CM

## 2016-07-20 NOTE — Therapy (Signed)
[place], [place], [address] Phone: [phone]   Fax:  [phone]  Physical Therapy Treatment  Patient Details  Name: [name] MRN: [id number] Date of Birth: 27-Jan-1941 Referring Provider: c. Mauricia Area  Encounter Date: 07/20/2016      PT End of Session - 07/20/16 1520    Visit Number 3   Number of Visits 18   Date for PT Re-Evaluation 09/03/16   PT Start Time 8115   PT Stop Time 1542   PT Time Calculation (min) 57 min      Past Medical History:  Diagnosis Date  . Adenomatous colon polyp 03/2003  . Anxiety    ativan for sleep  . Baker's cyst   . Degenerative lumbar disc    Dr. Nelva Bush, epidural injections  . Goiter   . History of elevated homocysteine   . Hypertension 09/11/2005  . Lumbar radiculopathy    Dr. Nelva Bush, epidureal injections  . Lumbosacral radiculopathy    seeing Pain Management  . OA (osteoarthritis)     knee pain after TKR left , back pain- last steroid injection back 2 months ago Dr Nelva Bush  . PONV (postoperative nausea and vomiting)   . Postlaminectomy syndrome, lumbar region    Dr. Nelva Bush, recommended doing bilateral S1 transforaminal epidural with D5W  . Thyroid nodule    rt benign colloid nodule--left hyperplastic nodule- Thyroid bx 2006: neg    Past Surgical History:  Procedure Laterality Date  . ABDOMINAL HYSTERECTOMY    . BACK SURGERY  11-09-2011   at [facility], lumbar  . bakers cystectomy     right knee  . FACELIFT    . INCISIONAL HERNIA REPAIR    . KNEE ARTHROSCOPY     L   replacement 2007, Dr Corinne Ports, then a manipulation, then redo TKR 11/09 w/ Dr.Alusio. Parcial replacement again 09-2010  . KNEE SURGERY     x 5-  left  . OOPHORECTOMY    . SHOULDER SURGERY     replacements: L 2010, R 2012  . TOTAL HIP ARTHROPLASTY  3/11   Right   . TOTAL KNEE ARTHROPLASTY  05/20/2012   Procedure: TOTAL KNEE ARTHROPLASTY;  Surgeon: Gearlean Alf, MD;  Location: [facility];   Service: Orthopedics;  Laterality: Right;    There were no vitals filed for this visit.      Subjective Assessment - 07/20/16 1444    Subjective better than earlier in the week I was reeally hurting   Currently in Pain? Yes   Pain Score 6    Pain Location Leg                         [facility] Adult PT Treatment/Exercise - 07/20/16 0001      Lumbar Exercises: Aerobic   Stationary Bike 6 min     Lumbar Exercises: Seated   Sit to Stand Limitations sit fit pelvic ROM and stab ex     Lumbar Exercises: Supine   Ab Set 15 reps   Bent Knee Raise 10 reps   Straight Leg Raise 10 reps  10 SLR with abd   Large Ball Abdominal Isometric 15 reps   Other Supine Lumbar Exercises isometric hip abd and add 10 each     Moist Heat Therapy   Number Minutes Moist Heat 15 Minutes   Moist Heat Location Lumbar Spine     Electrical Stimulation   Electrical Stimulation Location  LB   Electrical Stimulation Action IFC   Electrical Stimulation Parameters supine   Electrical Stimulation Goals Pain                  PT Short Term Goals - 07/20/16 1520      PT SHORT TERM GOAL #1   Title independent with initial HEP           PT Long Term Goals - 07/04/16 1600      PT LONG TERM GOAL #1   Title understand proper posture and body mechanics instruction   Time 8   Period Weeks   Status New     PT LONG TERM GOAL #2   Title increase lumbar ROM 25%   Time 8   Period Weeks   Status New     PT LONG TERM GOAL #3   Title report 25% decrease in pain   Time 8   Period Weeks   Status New     PT LONG TERM GOAL #4   Title increase LE strength to 4/5   Time 8   Period Weeks   Status New     PT LONG TERM GOAL #5   Title report pain after walking dog 4/10   Time 8   Period Weeks   Status New               Plan - 07/20/16 1520    Clinical Impression Statement STG met. Pt verb tight in LE and back ,initiating gentle stretches, omited foam rolling d/t  bruising, lumb ROM and stab.   PT Next Visit Plan LE flex and core stab      Patient will benefit from skilled therapeutic intervention in order to improve the following deficits and impairments:  Abnormal gait, Cardiopulmonary status limiting activity, Decreased activity tolerance, Decreased balance, Decreased range of motion, Decreased mobility, Decreased strength, Difficulty walking, Impaired flexibility, Increased muscle spasms, Postural dysfunction, Pain, Improper body mechanics  Visit Diagnosis: Chronic left-sided low back pain with left-sided sciatica  Muscle spasm of back  Difficulty in walking, not elsewhere classified     Problem List Patient Active Problem List   Diagnosis Date Noted  . Lumbar post-laminectomy syndrome 10/04/2015  . Chronic lumbar radiculopathy 10/04/2015  . PCP NOTES >>>>> 08/07/2015  . Allergic rhinitis 01/21/2015  . Leg edema 12/16/2013  . Weight loss 08/29/2012  . Lumbar canal stenosis 08/20/2012  . Annual physical exam 11/03/2011  . Heart murmur 11/03/2011  . Dermatitis 01/19/2011  . THYROID NODULE 03/15/2010  . PERSONAL HX COLONIC POLYPS 04/30/2008  . FATIGUE 04/17/2008  . ANXIETY- Insomnia 01/27/2008  . Osteoarthritis 01/27/2008  . BAKER'S CYST 12/20/2006  . Essential hypertension 09/11/2005    Rubye Strohmeyer,ANGIE PTA 07/20/2016, 3:23 PM  [place], [place], [address] Phone: [phone]   Fax:  [phone]  Name: [name] MRN: [id number] Date of Birth: 1940/10/13

## 2016-07-24 ENCOUNTER — Other Ambulatory Visit: Payer: Self-pay | Admitting: Physical Medicine & Rehabilitation

## 2016-07-27 ENCOUNTER — Ambulatory Visit: Payer: Medicare Other | Admitting: Physical Therapy

## 2016-08-10 DIAGNOSIS — M25561 Pain in right knee: Secondary | ICD-10-CM | POA: Diagnosis not present

## 2016-08-10 DIAGNOSIS — G894 Chronic pain syndrome: Secondary | ICD-10-CM | POA: Diagnosis not present

## 2016-08-10 DIAGNOSIS — M5417 Radiculopathy, lumbosacral region: Secondary | ICD-10-CM | POA: Diagnosis not present

## 2016-08-10 DIAGNOSIS — G609 Hereditary and idiopathic neuropathy, unspecified: Secondary | ICD-10-CM | POA: Diagnosis not present

## 2016-08-14 ENCOUNTER — Other Ambulatory Visit: Payer: Self-pay | Admitting: Internal Medicine

## 2016-08-14 NOTE — Telephone Encounter (Signed)
Okay #30, 1 refill 

## 2016-08-14 NOTE — Telephone Encounter (Signed)
Rx printed, awaiting MD signature.  

## 2016-08-14 NOTE — Telephone Encounter (Signed)
Pt is requesting refill on Lidocaine patches.  Last OV: 02/10/2016 Last Fill: 05/25/2016 #30 patches and 0RF   Okay to refill?

## 2016-08-14 NOTE — Telephone Encounter (Signed)
Rx faxed to [facility] pharmacy.  

## 2016-08-21 ENCOUNTER — Other Ambulatory Visit: Payer: Self-pay | Admitting: Internal Medicine

## 2016-08-21 NOTE — Telephone Encounter (Signed)
Rx printed, awaiting MD signature.  

## 2016-08-21 NOTE — Telephone Encounter (Signed)
Due  for a routine checkup, will do a UDS then--- please arrange. Okay #30, no refills

## 2016-08-21 NOTE — Telephone Encounter (Signed)
Rx faxed to [facility] pharmacy.  

## 2016-08-21 NOTE — Telephone Encounter (Signed)
Pt is requesting refill on Lorazepam.  Last OV: 02/10/2016 Last Fill: 05/11/2016 #30 and 2RF UDS: None  Please advise.

## 2016-08-22 ENCOUNTER — Other Ambulatory Visit: Payer: Self-pay | Admitting: Physical Medicine & Rehabilitation

## 2016-08-23 ENCOUNTER — Other Ambulatory Visit: Payer: Self-pay

## 2016-09-15 DIAGNOSIS — M5136 Other intervertebral disc degeneration, lumbar region: Secondary | ICD-10-CM | POA: Diagnosis not present

## 2016-09-15 DIAGNOSIS — M5416 Radiculopathy, lumbar region: Secondary | ICD-10-CM | POA: Diagnosis not present

## 2016-09-15 DIAGNOSIS — M961 Postlaminectomy syndrome, not elsewhere classified: Secondary | ICD-10-CM | POA: Diagnosis not present

## 2016-09-15 DIAGNOSIS — G894 Chronic pain syndrome: Secondary | ICD-10-CM | POA: Diagnosis not present

## 2016-10-14 DIAGNOSIS — M5416 Radiculopathy, lumbar region: Secondary | ICD-10-CM | POA: Diagnosis not present

## 2016-10-14 DIAGNOSIS — M961 Postlaminectomy syndrome, not elsewhere classified: Secondary | ICD-10-CM | POA: Diagnosis not present

## 2016-10-14 DIAGNOSIS — G894 Chronic pain syndrome: Secondary | ICD-10-CM | POA: Diagnosis not present

## 2016-10-14 DIAGNOSIS — Z79891 Long term (current) use of opiate analgesic: Secondary | ICD-10-CM | POA: Diagnosis not present

## 2016-10-24 ENCOUNTER — Encounter: Payer: Self-pay | Admitting: Internal Medicine

## 2016-10-24 ENCOUNTER — Ambulatory Visit (INDEPENDENT_AMBULATORY_CARE_PROVIDER_SITE_OTHER): Payer: Medicare Other | Admitting: Internal Medicine

## 2016-10-24 VITALS — BP 128/76 | HR 110 | Temp 98.0°F | Resp 14 | Ht 65.0 in | Wt 174.4 lb

## 2016-10-24 DIAGNOSIS — I10 Essential (primary) hypertension: Secondary | ICD-10-CM | POA: Diagnosis not present

## 2016-10-24 DIAGNOSIS — F411 Generalized anxiety disorder: Secondary | ICD-10-CM | POA: Diagnosis not present

## 2016-10-24 DIAGNOSIS — E7800 Pure hypercholesterolemia, unspecified: Secondary | ICD-10-CM

## 2016-10-24 DIAGNOSIS — E785 Hyperlipidemia, unspecified: Secondary | ICD-10-CM

## 2016-10-24 DIAGNOSIS — G894 Chronic pain syndrome: Secondary | ICD-10-CM

## 2016-10-24 LAB — LIPID PANEL
Cholesterol: 249 mg/dL — ABNORMAL HIGH (ref 0–200)
HDL: 97 mg/dL (ref >39.00)
LDL Cholesterol: 132 mg/dL — ABNORMAL HIGH (ref 0–99)
NonHDL: 151.73
Total CHOL/HDL Ratio: 3
Triglycerides: 101 mg/dL (ref 0.0–149.0)
VLDL: 20.2 mg/dL (ref 0.0–40.0)

## 2016-10-24 LAB — BASIC METABOLIC PANEL
BUN: 22 mg/dL (ref 6–23)
CO2: 30 mEq/L (ref 19–32)
Calcium: 9.7 mg/dL (ref 8.4–10.5)
Chloride: 101 mEq/L (ref 96–112)
Creatinine, Ser: 0.85 mg/dL (ref 0.40–1.20)
GFR: 69.14 mL/min (ref >60.00)
Glucose, Bld: 80 mg/dL (ref 70–99)
Potassium: 4.1 mEq/L (ref 3.5–5.1)
Sodium: 138 mEq/L (ref 135–145)

## 2016-10-24 LAB — AST: AST: 22 U/L (ref 0–37)

## 2016-10-24 LAB — ALT: ALT: 10 U/L (ref 0–35)

## 2016-10-24 MED ORDER — LORAZEPAM 0.5 MG PO TABS: 0.5000 mg | ORAL_TABLET | Freq: Every evening | ORAL | 2 refills | Status: DC | PRN

## 2016-10-24 NOTE — Progress Notes (Signed)
Pre visit review using our clinic review tool, if applicable. No additional management support is needed unless otherwise documented below in the visit note. 

## 2016-10-24 NOTE — Patient Instructions (Signed)
GO TO THE LAB : Get the blood work  , will need a  urine sample for a UDS   GO TO THE FRONT DESK Schedule your next appointment for a physical exam in 4 months

## 2016-10-24 NOTE — Progress Notes (Signed)
Subjective:    Patient ID: [name], female    DOB: 24-Jan-1941, 76 y.o.   MRN: [id number]  DOS:  10/24/2016 Type of visit - description : rov Interval history:  HTN: Good medication compliance, no apparent side effects. Pulse is slightly elevated today but is usually normal. High cholesterol: On statins, due for labs. Insomnia, on Ativan, used to take 1 at night, sometimes requires two. Pain management: Seen elsewhere, symptoms are not as well control as she desires, still has significant pain. Today he reports that symptoms are particularly bothersome. Heart rate upon arrival was 111, recheck 66.  Review of Systems Denies chest pain or difficulty breathing Has noted some left ankle puffiness. Area is no actual red, warm  or TTP No recent airplane trips or prolonged car trip  Past Medical History:  Diagnosis Date  . Adenomatous colon polyp 03/2003  . Anxiety    ativan for sleep  . Baker's cyst   . Degenerative lumbar disc    Dr. Nelva Bush, epidural injections  . Goiter   . History of elevated homocysteine   . Hypertension 09/11/2005  . Lumbar radiculopathy    Dr. Nelva Bush, epidureal injections  . Lumbosacral radiculopathy    seeing Pain Management  . OA (osteoarthritis)     knee pain after TKR left , back pain- last steroid injection back 2 months ago Dr Nelva Bush  . PONV (postoperative nausea and vomiting)   . Postlaminectomy syndrome, lumbar region    Dr. Nelva Bush, recommended doing bilateral S1 transforaminal epidural with D5W  . Thyroid nodule    rt benign colloid nodule--left hyperplastic nodule- Thyroid bx 2006: neg    Past Surgical History:  Procedure Laterality Date  . ABDOMINAL HYSTERECTOMY    . BACK SURGERY  11-09-2011   at [facility], lumbar  . bakers cystectomy     right knee  . FACELIFT    . INCISIONAL HERNIA REPAIR    . KNEE ARTHROSCOPY     L   replacement 2007, Dr Corinne Ports, then a manipulation, then redo TKR 11/09 w/ Dr.Alusio. Parcial replacement again 09-2010  . KNEE  SURGERY     x 5-  left  . OOPHORECTOMY    . SHOULDER SURGERY     replacements: L 2010, R 2012  . TOTAL HIP ARTHROPLASTY  3/11   Right   . TOTAL KNEE ARTHROPLASTY  05/20/2012   Procedure: TOTAL KNEE ARTHROPLASTY;  Surgeon: Gearlean Alf, MD;  Location: [facility];  Service: Orthopedics;  Laterality: Right;    Social History   Social History  . Marital status: Married    Spouse name: N/A  . Number of children: 2  . Years of education: N/A   Occupational History  . Retired Pharmacist, hospital     retired  .  Retired   Social History Main Topics  . Smoking status: Former Smoker    Quit date: 09/12/1983  . Smokeless tobacco: Never Used  . Alcohol use 0.0 oz/week     Comment: occasional   . Drug use: No  . Sexual activity: Not on file   Other Topics Concern  . Not on file   Social History Narrative   Lives w/ husband of 75 years in a 3 story home. Has 2 sons.  Retired Education officer, museum.  Education: college.               Allergies as of 10/24/2016      Reactions   Tramadol Hives, Swelling   Per Pt she  can take w/o reactions   Oxycodone-acetaminophen Nausea Only, Other (See Comments)   Headaches      Medication List       Accurate as of 10/24/16  6:09 PM. Always use your most recent med list.          aspirin 81 MG tablet Take 81 mg by mouth daily.   CALCIUM + D PO Take 600 mg by mouth daily after breakfast.   folic acid 1 MG tablet Commonly known as:  FOLVITE Take 2 tablets (2 mg total) by mouth daily.   gabapentin 800 MG tablet Commonly known as:  NEURONTIN TAKE 1 TABLET(800 MG) BY MOUTH FOUR TIMES DAILY   HYDROcodone-acetaminophen 5-325 MG tablet Commonly known as:  NORCO/VICODIN Take 1 tablet by mouth 2 (two) times daily as needed.   hydrocortisone 2.5 % cream   lidocaine 5 % Commonly known as:  LIDODERM Place 1 patch onto the skin daily. Remove and discard patch within 12 hours or as directed by MD.   lisinopril-hydrochlorothiazide 20-12.5 MG  tablet Commonly known as:  PRINZIDE,ZESTORETIC Take 1 tablet by mouth daily.   LORazepam 0.5 MG tablet Commonly known as:  ATIVAN Take 1-2 tablets (0.5-1 mg total) by mouth at bedtime as needed for sleep.   simvastatin 20 MG tablet Commonly known as:  ZOCOR Take 1 tablet (20 mg total) by mouth daily.   tretinoin 0.1 % cream Commonly known as:  RETIN-A   TYLENOL PO Take by mouth as needed.          Objective:   Physical Exam BP 128/76 (BP Location: Left Arm, Patient Position: Sitting, Cuff Size: Normal)   Pulse (!) 110   Temp 98 F (36.7 C) (Oral)   Resp 14   Ht 5\' 5"  (1.651 m)   Wt 174 lb 6 oz (79.1 kg)   SpO2 98%   BMI 29.02 kg/m  General:   Well developed, well nourished . NAD.  HEENT:  Normocephalic . Face symmetric, atraumatic Lungs:  CTA B Normal respiratory effort, no intercostal retractions, no accessory muscle use. Heart: RRR,  no murmur.  Lower extremities: Generalized pain with even light palpation throughout the legs from the knee down. Pedal pulses bilaterally Calves symmetric Left ankle   is slightly puffy but not red, warm or TTP. Skin: Not pale. Not jaundice Neurologic:  alert & oriented X3.  Speech normal, gait appropriate for age and unassisted Psych--  Cognition and judgment appear intact.  Cooperative with normal attention span and concentration.  Behavior appropriate. No anxious or depressed appearing.      Assessment & Plan:  Assessment> HTN Hyperlipidemia Anxiety, insomnia, ativan rx per pcp Elevated homocysteine Goiter,  Thyroid nodule BX 2006 neg, [place] 2013 stable [place] 02-2016 -- bx 03-2016 benign  MSK: sees pain mgmt ---DJD . ---Postlaminectomy syndrome, back surgery 2013  ---Intolerant to Lyrica, gabapentin helps --hydrocodone rx elsewhere --other dx: Neuropathy, chronic pain syndrome  Plan: HTN: Seems controlled on lisinopril HCT. Checking a BMP High cholesterol: On simvastatin, overdue for labs, although she is not  fasting we'll check a lipid panel, AST ALT Anxiety: On Ativan, used to take 1 at night as needed, no takes one or 2 at night. Prescription printed, get a UDS. Mild swelling left ankle: Area is not warm or red, slightly puffy, recommend observation, call anytime if calf swelling, or the ankle looks red-warm. Goiter: had a biopsy last year, benign Chronic pain syndrome: Unfortunately unable to obtain the pain control she desires. Counseled RTC  4 months, cpx

## 2016-10-24 NOTE — Assessment & Plan Note (Signed)
HTN: Seems controlled on lisinopril HCT. Checking a BMP High cholesterol: On simvastatin, overdue for labs, although she is not fasting we'll check a lipid panel, AST ALT Anxiety: On Ativan, used to take 1 at night as needed, no takes one or 2 at night. Prescription printed, get a UDS. Mild swelling left ankle: Area is not warm or red, slightly puffy, recommend observation, call anytime if calf swelling, or the ankle looks red-warm. Goiter: had a biopsy last year, benign Chronic pain syndrome: Unfortunately unable to obtain the pain control she desires. Counseled RTC 4 months, cpx

## 2016-10-26 ENCOUNTER — Encounter: Payer: Self-pay | Admitting: Internal Medicine

## 2016-10-26 DIAGNOSIS — Z79899 Other long term (current) drug therapy: Secondary | ICD-10-CM | POA: Diagnosis not present

## 2016-10-30 DIAGNOSIS — M5136 Other intervertebral disc degeneration, lumbar region: Secondary | ICD-10-CM | POA: Diagnosis not present

## 2016-10-30 DIAGNOSIS — Z4789 Encounter for other orthopedic aftercare: Secondary | ICD-10-CM | POA: Diagnosis not present

## 2016-10-31 ENCOUNTER — Ambulatory Visit: Payer: Medicare Other | Admitting: Physical Therapy

## 2016-11-02 ENCOUNTER — Encounter: Payer: Medicare Other | Admitting: Physical Therapy

## 2016-11-07 ENCOUNTER — Encounter: Payer: Self-pay | Admitting: Physical Therapy

## 2016-11-07 ENCOUNTER — Ambulatory Visit: Payer: Medicare Other | Attending: Orthopedic Surgery | Admitting: Physical Therapy

## 2016-11-07 DIAGNOSIS — M5442 Lumbago with sciatica, left side: Secondary | ICD-10-CM | POA: Diagnosis not present

## 2016-11-07 DIAGNOSIS — R262 Difficulty in walking, not elsewhere classified: Secondary | ICD-10-CM | POA: Diagnosis not present

## 2016-11-07 DIAGNOSIS — M62830 Muscle spasm of back: Secondary | ICD-10-CM | POA: Insufficient documentation

## 2016-11-07 DIAGNOSIS — G8929 Other chronic pain: Secondary | ICD-10-CM | POA: Diagnosis not present

## 2016-11-07 NOTE — Therapy (Signed)
[facility] [place], [place], [address] Phone: [phone]   Fax:  [phone]  Physical Therapy Evaluation  Patient Details  Name: [name] MRN: [id number] Date of Birth: 1940-10-06 Referring Provider: Rolena Infante  Encounter Date: 11/07/2016      PT End of Session - 11/07/16 1054    Visit Number 1   Date for PT Re-Evaluation 01/05/17   PT Start Time 0928   PT Stop Time 1016   PT Time Calculation (min) 48 min   Activity Tolerance Patient tolerated treatment well   Behavior During Therapy [facility] for tasks assessed/performed      Past Medical History:  Diagnosis Date  . Adenomatous colon polyp 03/2003  . Anxiety    ativan for sleep  . Baker's cyst   . Degenerative lumbar disc    Dr. Nelva Bush, epidural injections  . Goiter   . History of elevated homocysteine   . Hypertension 09/11/2005  . Lumbar radiculopathy    Dr. Nelva Bush, epidureal injections  . Lumbosacral radiculopathy    seeing Pain Management  . OA (osteoarthritis)     knee pain after TKR left , back pain- last steroid injection back 2 months ago Dr Nelva Bush  . PONV (postoperative nausea and vomiting)   . Postlaminectomy syndrome, lumbar region    Dr. Nelva Bush, recommended doing bilateral S1 transforaminal epidural with D5W  . Thyroid nodule    rt benign colloid nodule--left hyperplastic nodule- Thyroid bx 2006: neg    Past Surgical History:  Procedure Laterality Date  . ABDOMINAL HYSTERECTOMY    . BACK SURGERY  11-09-2011   at [facility], lumbar  . bakers cystectomy     right knee  . FACELIFT    . INCISIONAL HERNIA REPAIR    . KNEE ARTHROSCOPY     L   replacement 2007, Dr Corinne Ports, then a manipulation, then redo TKR 11/09 w/ Dr.Alusio. Parcial replacement again 09-2010  . KNEE SURGERY     x 5-  left  . OOPHORECTOMY    . SHOULDER SURGERY     replacements: L 2010, R 2012  . TOTAL HIP ARTHROPLASTY  3/11   Right   . TOTAL KNEE ARTHROPLASTY  05/20/2012    Procedure: TOTAL KNEE ARTHROPLASTY;  Surgeon: Gearlean Alf, MD;  Location: [facility];  Service: Orthopedics;  Laterality: Right;    There were no vitals filed for this visit.       Subjective Assessment - 11/07/16 0935    Subjective Patient underwent a lumbar fusion about 3 years ago.  She reports that there really has not been any relief of pain.  She has a history of bilateral knees and shoulder replacements and left hip replacement.  She has been seen here by PT for the back issue with at times some relief but minimal carryover, she is someone that is motivated but much of the time she seems to over do it with yardwork and housework   Patient Stated Goals have less pain   Currently in Pain? Yes   Pain Score 8    Pain Location Back   Pain Orientation Lower   Pain Descriptors / Indicators Aching;Burning   Pain Type Chronic pain   Pain Radiating Towards she does reports a numbing, burning type of pain in the legs, she is tender to palpation here as well   Pain Onset More than a month ago   Pain Frequency Constant   Aggravating Factors  walking, housework,  any activity will increase her pain, she reports that she has to force through things because she is afraid to sit and "waste away"   Pain Relieving Factors the heat and the electrical stimulation helps for some temporary relief, warmer weather helps some   Effect of Pain on Daily Activities reports just limited and has pain for all  ADL's            [facility] PT Assessment - 11/07/16 0001      Assessment   Medical Diagnosis LBP   Referring Provider Rolena Infante   Onset Date/Surgical Date 10/07/16   Prior Therapy yes last year     Precautions   Precautions None     Balance Screen   Has the patient fallen in the past 6 months No   Has the patient had a decrease in activity level because of a fear of falling?  No   Is the patient reluctant to leave their home because of a fear of falling?  No     Home Environment   Additional  Comments does some housework and yardwork, has a chair lift for the stairs     Prior Function   Level of Independence Independent   Vocation Retired   Leisure some exercise     Posture/Postural Control   Posture Comments fwd head, rounded shoulders     ROM / Strength   AROM / PROM / Strength AROM;Strength     AROM   Overall AROM Comments Lumbar ROM decreased 50% flexion, decreased 100% for side bending and extension, knee AROM 5-95 degrees flexion     Strength   Overall Strength Comments LE's 4-/5 with some pain in the low back     Flexibility   Soft Tissue Assessment /Muscle Length --  tight HS R>L, piriformis L>R     Palpation   Palpation comment very tight and tender in the left ITB and LE, the lumbar paraspinals, the left SI area, right leg longer in long sitting     Transfers   Comments gaurded with bed mobility     Ambulation/Gait   Gait Comments no device, very small steps, stooped posture                   [facility] Adult PT Treatment/Exercise - 11/07/16 0001      Modalities   Modalities Electrical Stimulation;Moist Heat     Moist Heat Therapy   Number Minutes Moist Heat 15 Minutes   Moist Heat Location Lumbar Spine     Electrical Stimulation   Electrical Stimulation Location lumbar spine   Electrical Stimulation Action IFC   Electrical Stimulation Parameters supine   Electrical Stimulation Goals Pain                  PT Short Term Goals - 11/07/16 1104      PT SHORT TERM GOAL #1   Title independent with initial HEP   Time 2   Period Weeks   Status New           PT Long Term Goals - 11/07/16 1104      PT LONG TERM GOAL #1   Title understand proper posture and body mechanics instruction   Time 8   Period Weeks   Status New     PT LONG TERM GOAL #2   Title increase lumbar ROM 25%   Time 8   Period Weeks   Status New     PT LONG TERM GOAL #3  Title report 25% decrease in pain   Time 8   Period Weeks   Status New      PT LONG TERM GOAL #4   Title increase LE strength to 4/5   Time 8   Period Weeks   Status New     PT LONG TERM GOAL #5   Title report pain after walking dog 4/10   Time 8   Period Weeks   Status New               Plan - 11/07/16 1055    Clinical Impression Statement Pateint with a long history of LBP, has had multiple surgeries, the last one 3 years ago, she has had injections, different ablations and medications.   She has continued to have significant pain in the low back.  She has some pain in the legs.  She is very tender in the SI and left leg area.  She is tight in the HS, piriformis and very tight and painful in the quads and hip flexors, this stretch seems to replicate her pain.   Rehab Potential Fair   PT Frequency 2x / week   PT Duration 8 weeks   PT Treatment/Interventions ADLs/Self Care Home Management;Cryotherapy;Electrical Stimulation;Iontophoresis 4mg /ml Dexamethasone;Moist Heat;Ultrasound;Gait training;Functional mobility training;Therapeutic activities;Therapeutic exercise;Neuromuscular re-education;Patient/family education;Passive range of motion;Manual techniques;Taping   PT Next Visit Plan work on the flexibility especially of the quads and hip flexors   Consulted and Agree with Plan of Care Patient      Patient will benefit from skilled therapeutic intervention in order to improve the following deficits and impairments:  Abnormal gait, Decreased activity tolerance, Decreased mobility, Decreased range of motion, Decreased strength, Increased muscle spasms, Postural dysfunction, Improper body mechanics, Pain  Visit Diagnosis: Chronic left-sided low back pain with left-sided sciatica - Plan: PT plan of care cert/re-cert  Muscle spasm of back - Plan: PT plan of care cert/re-cert  Difficulty in walking, not elsewhere classified - Plan: PT plan of care cert/re-cert      G-Codes - [id number]    Functional Assessment Tool Used (Outpatient Only) foto  68% limitation   Functional Limitation Mobility: Walking and moving around   Mobility: Walking and Moving Around Current Status [id number]) At least 60 percent but less than 80 percent impaired, limited or restricted   Mobility: Walking and Moving Around Goal Status [phone]) At least 40 percent but less than 60 percent impaired, limited or restricted       Problem List Patient Active Problem List   Diagnosis Date Noted  . Chronic pain syndrome 10/24/2016  . High cholesterol 10/24/2016  . Lumbar post-laminectomy syndrome 10/04/2015  . Chronic lumbar radiculopathy 10/04/2015  . PCP NOTES >>>>> 08/07/2015  . Allergic rhinitis 01/21/2015  . Leg edema 12/16/2013  . Weight loss 08/29/2012  . Lumbar canal stenosis 08/20/2012  . Annual physical exam 11/03/2011  . Heart murmur 11/03/2011  . Dermatitis 01/19/2011  . THYROID NODULE 03/15/2010  . PERSONAL HX COLONIC POLYPS 04/30/2008  . FATIGUE 04/17/2008  . Anxiety state 01/27/2008  . Osteoarthritis 01/27/2008  . BAKER'S CYST 12/20/2006  . Essential hypertension 09/11/2005    Sumner Boast., PT 11/07/2016, 11:09 AM  [facility] [place], [place], [address] Phone: [phone]   Fax:  [phone]  Name: [name] MRN: [id number] Date of Birth: [birth year]

## 2016-11-08 ENCOUNTER — Ambulatory Visit: Payer: Medicare Other | Admitting: Physical Therapy

## 2016-11-10 ENCOUNTER — Encounter: Payer: Self-pay | Admitting: Physical Therapy

## 2016-11-10 ENCOUNTER — Ambulatory Visit: Payer: Medicare Other | Attending: Orthopedic Surgery | Admitting: Physical Therapy

## 2016-11-10 DIAGNOSIS — G8929 Other chronic pain: Secondary | ICD-10-CM

## 2016-11-10 DIAGNOSIS — M5442 Lumbago with sciatica, left side: Secondary | ICD-10-CM | POA: Insufficient documentation

## 2016-11-10 DIAGNOSIS — M62830 Muscle spasm of back: Secondary | ICD-10-CM | POA: Diagnosis not present

## 2016-11-10 DIAGNOSIS — R262 Difficulty in walking, not elsewhere classified: Secondary | ICD-10-CM | POA: Insufficient documentation

## 2016-11-10 NOTE — Therapy (Signed)
[name] [place], [place], [address] Phone: [phone]   Fax:  [phone]  Physical Therapy Treatment  Patient Details  Name: [name] MRN: [id number] Date of Birth: April 30, 1941 Referring Provider: Rolena [name]  Encounter Date: 11/10/2016      PT End of Session - 11/10/16 1135    Visit Number 2   Date for PT Re-Evaluation 01/05/17   PT Start Time 1045   PT Stop Time 1145   PT Time Calculation (min) 60 min   Activity Tolerance Patient tolerated treatment well   Behavior During Therapy [facility] for tasks assessed/performed      Past Medical History:  Diagnosis Date  . Adenomatous colon polyp 03/2003  . Anxiety    ativan for sleep  . Baker's cyst   . Degenerative lumbar disc    Dr. Nelva [name], epidural injections  . Goiter   . History of elevated homocysteine   . Hypertension 09/11/2005  . Lumbar radiculopathy    Dr. Nelva [name], epidureal injections  . Lumbosacral radiculopathy    seeing Pain Management  . OA (osteoarthritis)     knee pain after TKR left , back pain- last steroid injection back 2 months ago Dr [name]  . PONV (postoperative nausea and vomiting)   . Postlaminectomy syndrome, lumbar region    Dr. Nelva [name], recommended doing bilateral S1 transforaminal epidural with D5W  . Thyroid nodule    rt benign colloid nodule--left hyperplastic nodule- Thyroid bx 2006: neg    Past Surgical History:  Procedure Laterality Date  . ABDOMINAL HYSTERECTOMY    . BACK SURGERY  11-09-2011   at [facility], lumbar  . bakers cystectomy     right knee  . FACELIFT    . INCISIONAL HERNIA REPAIR    . KNEE ARTHROSCOPY     L   replacement 2007, Dr [name], then a manipulation, then redo TKR 11/09 w/ Dr.Alusio. Parcial replacement again 09-2010  . KNEE SURGERY     x 5-  left  . OOPHORECTOMY    . SHOULDER SURGERY     replacements: L 2010, R 2012  . TOTAL HIP ARTHROPLASTY  3/11   Right   . TOTAL KNEE ARTHROPLASTY  05/20/2012   Procedure: TOTAL KNEE ARTHROPLASTY;  Surgeon: Laurie Alf, MD;  Location: [facility];  Service: Orthopedics;  Laterality: Right;    There were no vitals filed for this visit.      Subjective Assessment - 11/10/16 1055    Subjective Patient reports that she felt pretty good after the evaluation, but reports that she was helping her husband put on socks and she fell on her left buttock.  She reports that she is having some increased pain and feeling "bruised"   Currently in Pain? Yes   Pain Score 8    Pain Location Back   Pain Orientation Left;Lower   Aggravating Factors  reports falling this AM has increased the pain                         [facility] Adult PT Treatment/Exercise - 11/10/16 0001      Exercises   Exercises Lumbar     Lumbar Exercises: Stretches   Passive Hamstring Stretch 4 reps;30 seconds   Double Knee to Chest Stretch 5 reps;10 seconds   Hip Flexor Stretch 4 reps;30 seconds   Quad Stretch 4 reps;30 seconds     Lumbar Exercises: Aerobic   Stationary Bike 5  minutes   UBE (Upper Arm Bike) NuStep level 5 x 5 minutes     Lumbar Exercises: Machines for Strengthening   Other Lumbar Machine Exercise hip extension 5# 2x10 bilatrerally     Lumbar Exercises: Standing   Row 20 reps;Theraband   Theraband Level (Row) Level 2 (Red)   Shoulder Extension 20 reps;Theraband   Theraband Level (Shoulder Extension) Level 2 (Red)     Lumbar Exercises: Supine   Ab Set 20 reps;2 seconds   Large Ball Abdominal Isometric 20 reps;2 seconds   Other Supine Lumbar Exercises feet on ball K2C, trunk rotation, bridges, ball b/n knees squeeze   Other Supine Lumbar Exercises blue tband hip extension     Moist Heat Therapy   Number Minutes Moist Heat 15 Minutes   Moist Heat Location Lumbar Spine     Electrical Stimulation   Electrical Stimulation Location lumbar spine   Electrical Stimulation Action IFC   Electrical Stimulation Parameters supine   Electrical Stimulation  Goals Pain                  PT Short Term Goals - 11/07/16 1104      PT SHORT TERM GOAL #1   Title independent with initial HEP   Time 2   Period Weeks   Status New           PT Long Term Goals - 11/07/16 1104      PT LONG TERM GOAL #1   Title understand proper posture and body mechanics instruction   Time 8   Period Weeks   Status New     PT LONG TERM GOAL #2   Title increase lumbar ROM 25%   Time 8   Period Weeks   Status New     PT LONG TERM GOAL #3   Title report 25% decrease in pain   Time 8   Period Weeks   Status New     PT LONG TERM GOAL #4   Title increase LE strength to 4/5   Time 8   Period Weeks   Status New     PT LONG TERM GOAL #5   Title report pain after walking dog 4/10   Time 8   Period Weeks   Status New               Plan - 11/10/16 1135    Clinical Impression Statement Patients quads and hip flexors are very tight, when stretching these she did havae some pain in the back.  She did well with the exercises and most she did not report any increase of pain, just some tightness   PT Next Visit Plan work on the flexibility especially of the quads and hip flexors add core stability as toerated   Consulted and Agree with Plan of Care Patient      Patient will benefit from skilled therapeutic intervention in order to improve the following deficits and impairments:  Abnormal gait, Decreased activity tolerance, Decreased mobility, Decreased range of motion, Decreased strength, Increased muscle spasms, Postural dysfunction, Improper body mechanics, Pain  Visit Diagnosis: Chronic left-sided low back pain with left-sided sciatica  Muscle spasm of back  Difficulty in walking, not elsewhere classified     Problem List Patient Active Problem List   Diagnosis Date Noted  . Chronic pain syndrome 10/24/2016  . High cholesterol 10/24/2016  . Lumbar post-laminectomy syndrome 10/04/2015  . Chronic lumbar radiculopathy  10/04/2015  . PCP NOTES >>>>> 08/07/2015  . Allergic  rhinitis 01/21/2015  . Leg edema 12/16/2013  . Weight loss 08/29/2012  . Lumbar canal stenosis 08/20/2012  . Annual physical exam 11/03/2011  . Heart murmur 11/03/2011  . Dermatitis 01/19/2011  . THYROID NODULE 03/15/2010  . PERSONAL HX COLONIC POLYPS 04/30/2008  . FATIGUE 04/17/2008  . Anxiety state 01/27/2008  . Osteoarthritis 01/27/2008  . BAKER'S CYST 12/20/2006  . Essential hypertension 09/11/2005    Sumner Boast., PT 11/10/2016, 11:37 AM  [facility] [place], [place], [address] Phone: [phone]   Fax:  [phone]  Name: [name] MRN: [id number] Date of Birth: July 28, 1941

## 2016-11-14 ENCOUNTER — Encounter: Payer: Self-pay | Admitting: Physical Therapy

## 2016-11-14 ENCOUNTER — Ambulatory Visit: Payer: Medicare Other | Admitting: Physical Therapy

## 2016-11-14 DIAGNOSIS — G8929 Other chronic pain: Secondary | ICD-10-CM

## 2016-11-14 DIAGNOSIS — M62830 Muscle spasm of back: Secondary | ICD-10-CM | POA: Diagnosis not present

## 2016-11-14 DIAGNOSIS — M5442 Lumbago with sciatica, left side: Secondary | ICD-10-CM | POA: Diagnosis not present

## 2016-11-14 DIAGNOSIS — R262 Difficulty in walking, not elsewhere classified: Secondary | ICD-10-CM | POA: Diagnosis not present

## 2016-11-14 IMAGING — US US SOFT TISSUE HEAD/NECK
1 series · 13 of 25 positions shown · non-contrast
Comparison: Thyroid ultrasound November 08, 2011.

CLINICAL DATA: Thyromegaly.  History of thyroid nodules.

EXAM:
THYROID ULTRASOUND
TECHNIQUE: Ultrasound examination of the thyroid gland and adjacent soft
tissues was performed.

[Series 1: us soft tissue head/neck · 0.06mm/px · 13 of 61 slices shown]
[im 1/61]
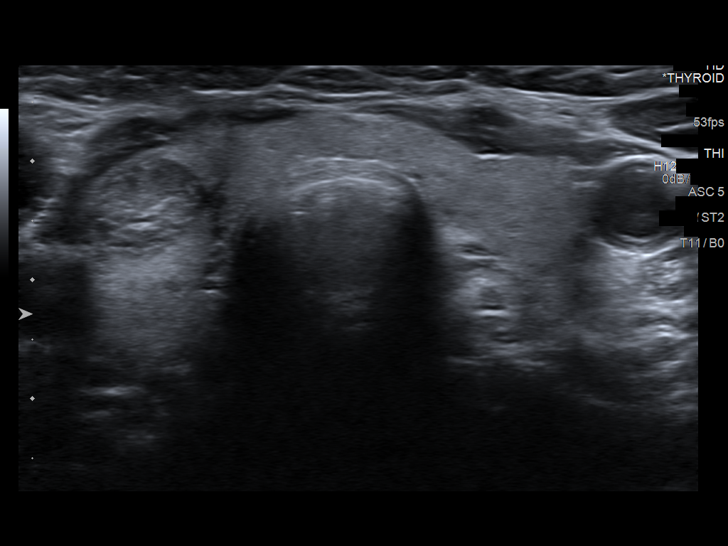
[im 6/61]
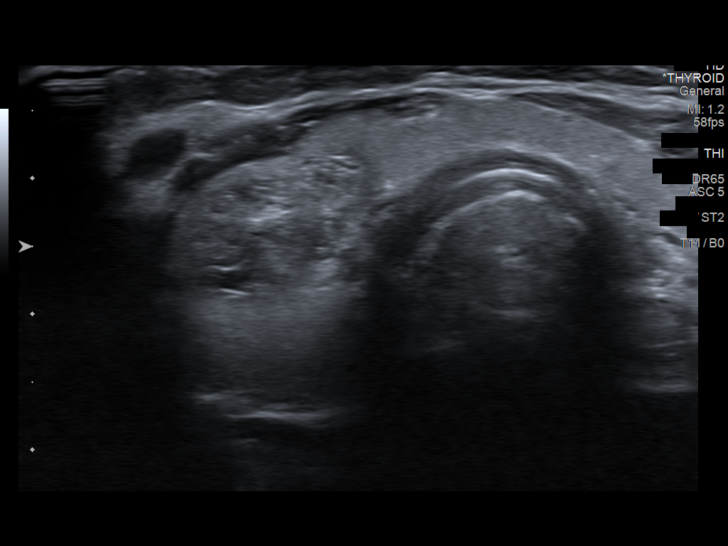
[im 11/61]
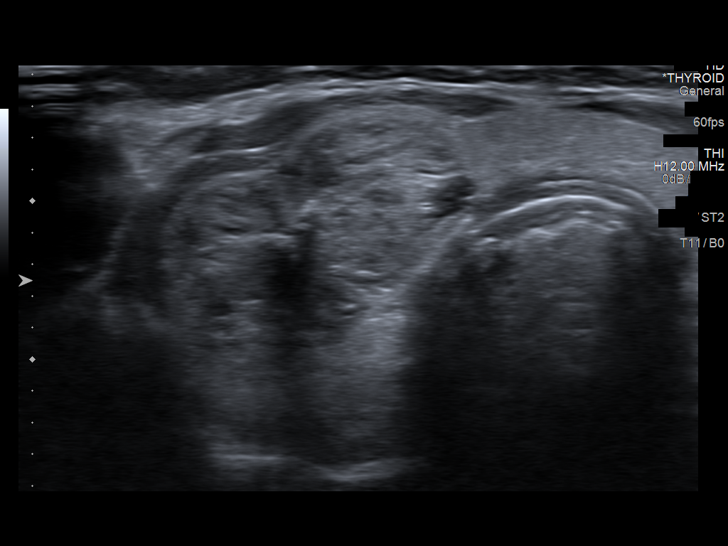
[im 16/61]
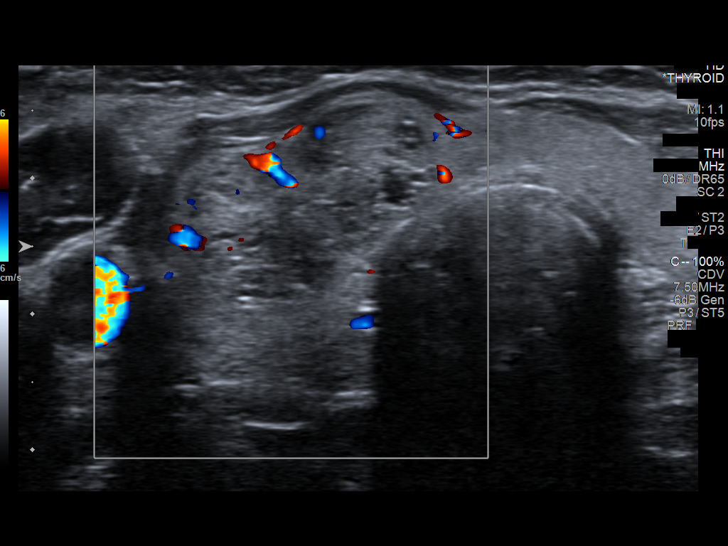
[im 21/61]
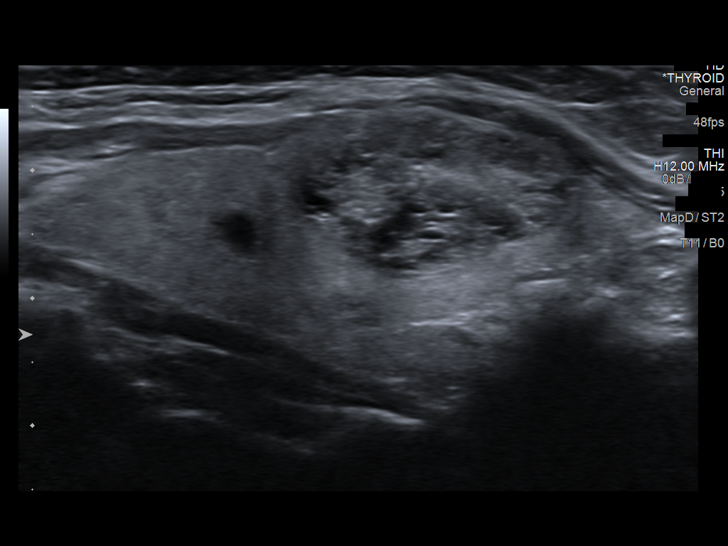
[im 26/61]
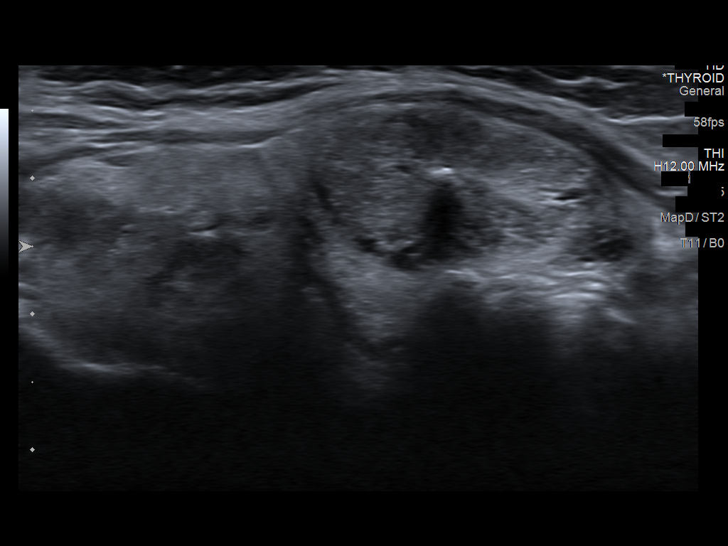
[im 31/61]
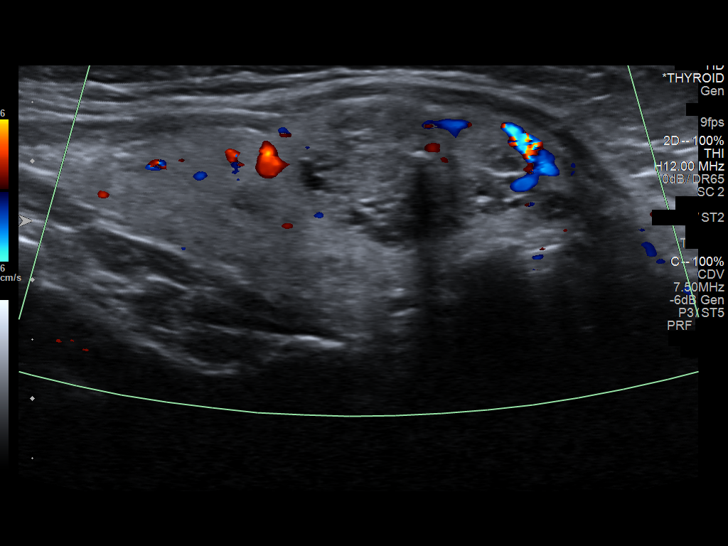
[im 36/61]
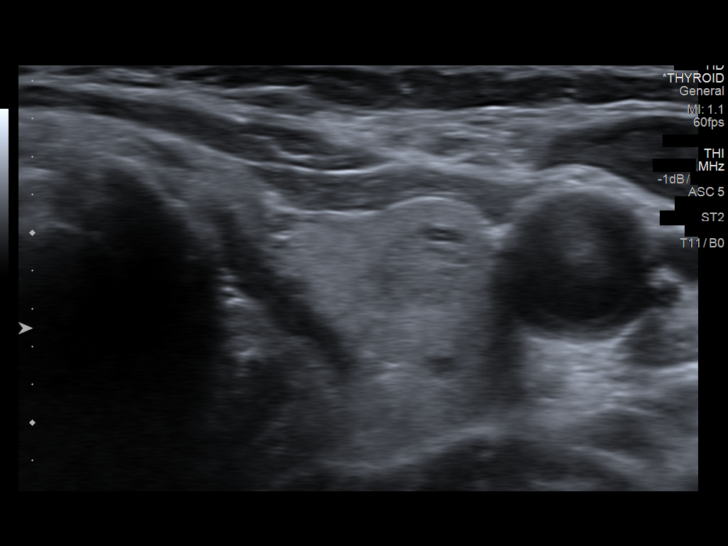
[im 41/61]
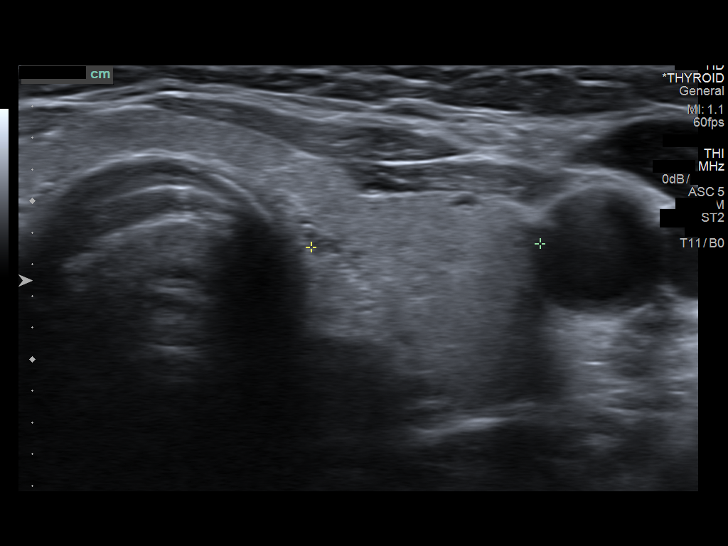
[im 46/61]
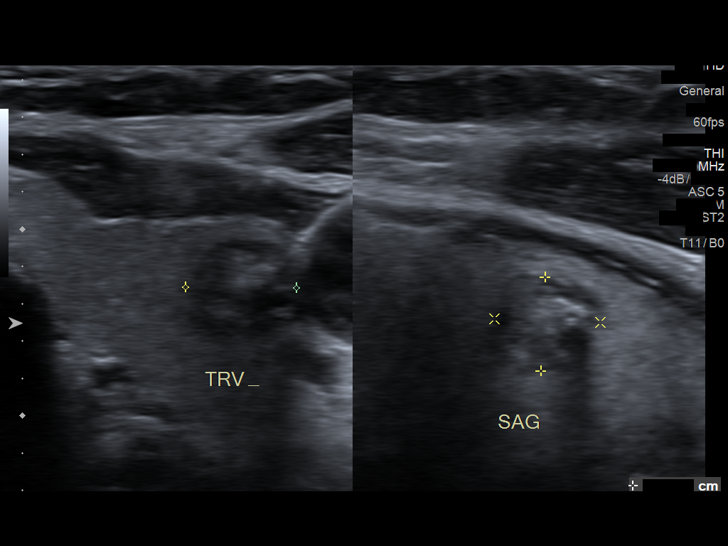
[im 51/61]
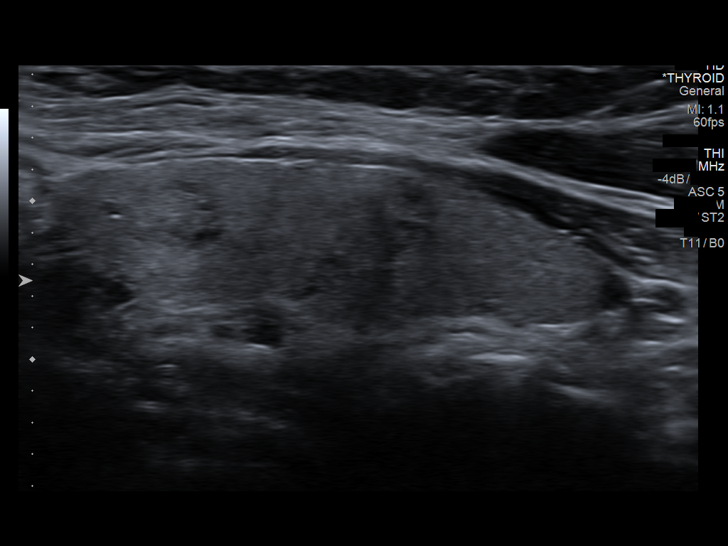
[im 56/61]
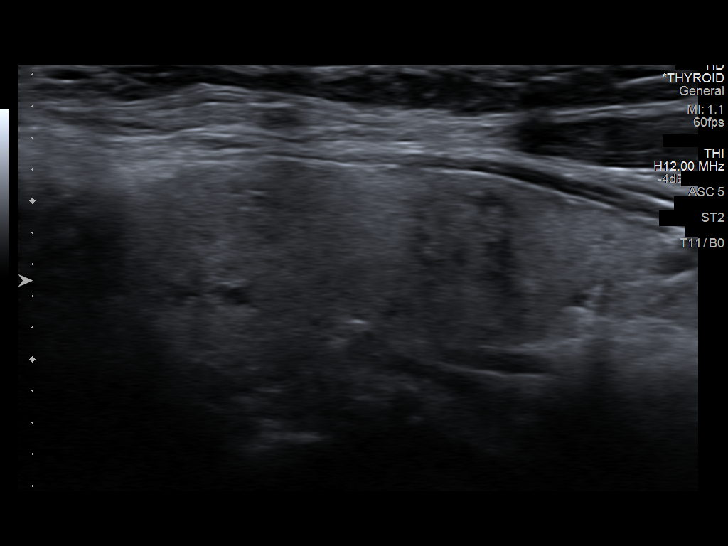
[im 61/61]
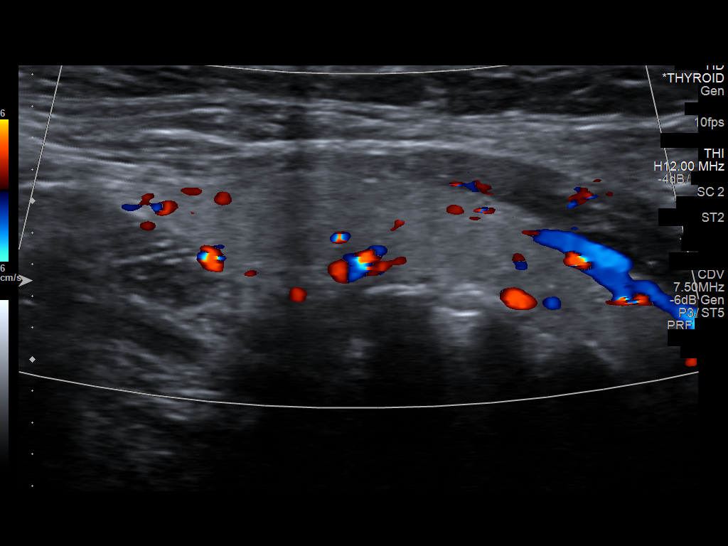

[13 of 25 positions shown; findings below may reference images not displayed]

FINDINGS: Right thyroid lobe

Measurements: 6.0 x 2.4 x 1.9 cm. 2.6 x 1.6 x 1.4 cm predominantly
solid mass is noted in midpole which is slightly enlarged compared
to prior exam. Also noted is 8 mm complex cyst in upper pole.

Left thyroid lobe

Measurements: 3.7 x 1.5 x 1.17. 6 mm solid nodule is noted in upper
pole which is unchanged compared to prior exam. 6 mm solid nodule is
noted in lower pole which is unchanged compared to prior exam.

Isthmus

Thickness: 5 mm.  No nodules visualized.

Lymphadenopathy

None visualized.
IMPRESSION: Predominantly solid nodule measuring 2.6 x 1.6 x 1.4 cm is noted in
midpole of right thyroid lobe which appears to be slightly enlarged
compared to prior exam. Findings meet consensus criteria for biopsy.
Ultrasound-guided fine needle aspiration should be considered, as
per the consensus statement: Management of Thyroid Nodules Detected
at US: Society of Radiologists in Ultrasound Consensus Conference

## 2016-11-14 NOTE — Therapy (Signed)
[place], [place], [address] Phone: [phone]   Fax:  [phone]  Physical Therapy Treatment  Patient Details  Name: [name] MRN: [id number] Date of Birth: 10-Apr-1941 Referring Provider: Rolena Infante  Encounter Date: 11/14/2016      PT End of Session - 11/14/16 1120    Visit Number 3   Date for PT Re-Evaluation 01/05/17   PT Start Time 1100   PT Stop Time 1140   PT Time Calculation (min) 40 min      Past Medical History:  Diagnosis Date  . Adenomatous colon polyp 03/2003  . Anxiety    ativan for sleep  . Baker's cyst   . Degenerative lumbar disc    Dr. Nelva Bush, epidural injections  . Goiter   . History of elevated homocysteine   . Hypertension 09/11/2005  . Lumbar radiculopathy    Dr. Nelva Bush, epidureal injections  . Lumbosacral radiculopathy    seeing Pain Management  . OA (osteoarthritis)     knee pain after TKR left , back pain- last steroid injection back 2 months ago Dr Nelva Bush  . PONV (postoperative nausea and vomiting)   . Postlaminectomy syndrome, lumbar region    Dr. Nelva Bush, recommended doing bilateral S1 transforaminal epidural with D5W  . Thyroid nodule    rt benign colloid nodule--left hyperplastic nodule- Thyroid bx 2006: neg    Past Surgical History:  Procedure Laterality Date  . ABDOMINAL HYSTERECTOMY    . BACK SURGERY  11-09-2011   at [facility], lumbar  . bakers cystectomy     right knee  . FACELIFT    . INCISIONAL HERNIA REPAIR    . KNEE ARTHROSCOPY     L   replacement 2007, Dr Corinne Ports, then a manipulation, then redo TKR 11/09 w/ Dr.Alusio. Parcial replacement again 09-2010  . KNEE SURGERY     x 5-  left  . OOPHORECTOMY    . SHOULDER SURGERY     replacements: L 2010, R 2012  . TOTAL HIP ARTHROPLASTY  3/11   Right   . TOTAL KNEE ARTHROPLASTY  05/20/2012   Procedure: TOTAL KNEE ARTHROPLASTY;  Surgeon: Gearlean Alf, MD;  Location: [facility];  Service: Orthopedics;   Laterality: Right;    There were no vitals filed for this visit.      Subjective Assessment - 11/14/16 1057    Subjective "still sore from ex- back and abs   Currently in Pain? Yes   Pain Score 8                          [facility] Adult PT Treatment/Exercise - 11/14/16 0001      Lumbar Exercises: Stretches   Hip Flexor Stretch 5 reps;30 seconds  with gentle STW to hip flex and quad     Lumbar Exercises: Supine   Other Supine Lumbar Exercises core stab 20 min while on heat                  PT Short Term Goals - 11/07/16 1104      PT SHORT TERM GOAL #1   Title independent with initial HEP   Time 2   Period Weeks   Status New           PT Long Term Goals - 11/07/16 1104      PT LONG TERM GOAL #1   Title understand proper posture and body mechanics instruction  Time 8   Period Weeks   Status New     PT LONG TERM GOAL #2   Title increase lumbar ROM 25%   Time 8   Period Weeks   Status New     PT LONG TERM GOAL #3   Title report 25% decrease in pain   Time 8   Period Weeks   Status New     PT LONG TERM GOAL #4   Title increase LE strength to 4/5   Time 8   Period Weeks   Status New     PT LONG TERM GOAL #5   Title report pain after walking dog 4/10   Time 8   Period Weeks   Status New               Plan - 11/14/16 1120    Clinical Impression Statement cuing of core activation with ex and cuing to breath and control speed. tolerated STW with manual stretch on quad and hip flex fair- gentle to tolerance   PT Next Visit Plan work on the flexibility especially of the quads and hip flexors add core stability as toerated      Patient will benefit from skilled therapeutic intervention in order to improve the following deficits and impairments:  Abnormal gait, Decreased activity tolerance, Decreased mobility, Decreased range of motion, Decreased strength, Increased muscle spasms, Postural dysfunction, Improper body  mechanics, Pain  Visit Diagnosis: Chronic left-sided low back pain with left-sided sciatica  Muscle spasm of back  Difficulty in walking, not elsewhere classified  Chronic bilateral low back pain with left-sided sciatica     Problem List Patient Active Problem List   Diagnosis Date Noted  . Chronic pain syndrome 10/24/2016  . High cholesterol 10/24/2016  . Lumbar post-laminectomy syndrome 10/04/2015  . Chronic lumbar radiculopathy 10/04/2015  . PCP NOTES >>>>> 08/07/2015  . Allergic rhinitis 01/21/2015  . Leg edema 12/16/2013  . Weight loss 08/29/2012  . Lumbar canal stenosis 08/20/2012  . Annual physical exam 11/03/2011  . Heart murmur 11/03/2011  . Dermatitis 01/19/2011  . THYROID NODULE 03/15/2010  . PERSONAL HX COLONIC POLYPS 04/30/2008  . FATIGUE 04/17/2008  . Anxiety state 01/27/2008  . Osteoarthritis 01/27/2008  . BAKER'S CYST 12/20/2006  . Essential hypertension 09/11/2005    PAYSEUR,ANGIE PTA 11/14/2016, 11:22 AM  [place], [place], [address] Phone: [phone]   Fax:  [phone]  Name: VELMA CRISAFULLI MRN: [id number] Date of Birth: 05/13/1941

## 2016-11-16 ENCOUNTER — Ambulatory Visit: Payer: Medicare Other | Admitting: Physical Therapy

## 2016-11-16 ENCOUNTER — Encounter: Payer: Self-pay | Admitting: Physical Therapy

## 2016-11-16 DIAGNOSIS — G8929 Other chronic pain: Secondary | ICD-10-CM

## 2016-11-16 DIAGNOSIS — M62830 Muscle spasm of back: Secondary | ICD-10-CM | POA: Diagnosis not present

## 2016-11-16 DIAGNOSIS — R262 Difficulty in walking, not elsewhere classified: Secondary | ICD-10-CM

## 2016-11-16 DIAGNOSIS — M5442 Lumbago with sciatica, left side: Secondary | ICD-10-CM | POA: Diagnosis not present

## 2016-11-16 NOTE — Therapy (Signed)
[place], [place], [address] Phone: [phone]   Fax:  [phone]  Physical Therapy Treatment  Patient Details  Name: [name] MRN: [id number] Date of Birth: 06-28-1941 Referring Provider: Rolena Infante  Encounter Date: 11/16/2016      PT End of Session - 11/16/16 1113    Visit Number 4   Date for PT Re-Evaluation 01/05/17   PT Start Time 1100   PT Stop Time 1145   PT Time Calculation (min) 45 min      Past Medical History:  Diagnosis Date  . Adenomatous colon polyp 03/2003  . Anxiety    ativan for sleep  . Baker's cyst   . Degenerative lumbar disc    Dr. Nelva Bush, epidural injections  . Goiter   . History of elevated homocysteine   . Hypertension 09/11/2005  . Lumbar radiculopathy    Dr. Nelva Bush, epidureal injections  . Lumbosacral radiculopathy    seeing Pain Management  . OA (osteoarthritis)     knee pain after TKR left , back pain- last steroid injection back 2 months ago Dr Nelva Bush  . PONV (postoperative nausea and vomiting)   . Postlaminectomy syndrome, lumbar region    Dr. Nelva Bush, recommended doing bilateral S1 transforaminal epidural with D5W  . Thyroid nodule    rt benign colloid nodule--left hyperplastic nodule- Thyroid bx 2006: neg    Past Surgical History:  Procedure Laterality Date  . ABDOMINAL HYSTERECTOMY    . BACK SURGERY  11-09-2011   at [facility], lumbar  . bakers cystectomy     right knee  . FACELIFT    . INCISIONAL HERNIA REPAIR    . KNEE ARTHROSCOPY     L   replacement 2007, Dr Corinne Ports, then a manipulation, then redo TKR 11/09 w/ Dr.Alusio. Parcial replacement again 09-2010  . KNEE SURGERY     x 5-  left  . OOPHORECTOMY    . SHOULDER SURGERY     replacements: L 2010, R 2012  . TOTAL HIP ARTHROPLASTY  3/11   Right   . TOTAL KNEE ARTHROPLASTY  05/20/2012   Procedure: TOTAL KNEE ARTHROPLASTY;  Surgeon: Gearlean Alf, MD;  Location: [facility];  Service: Orthopedics;   Laterality: Right;    There were no vitals filed for this visit.      Subjective Assessment - 11/16/16 1047    Subjective very sore but I think it may be doing something good   Currently in Pain? Yes   Pain Score 4    Pain Location Knee                         [facility] Adult PT Treatment/Exercise - 11/16/16 0001      Lumbar Exercises: Aerobic   Stationary Bike 6 min     Lumbar Exercises: Supine   Other Supine Lumbar Exercises core stab 25 min      Manual Therapy   Manual Therapy Passive ROM;Soft tissue mobilization   Manual therapy comments with MH   Soft tissue mobilization LE with stretch   Passive ROM LE and trunk                  PT Short Term Goals - 11/16/16 1114      PT SHORT TERM GOAL #1   Title independent with initial HEP   Status On-going           PT Long Term  Goals - 11/07/16 1104      PT LONG TERM GOAL #1   Title understand proper posture and body mechanics instruction   Time 8   Period Weeks   Status New     PT LONG TERM GOAL #2   Title increase lumbar ROM 25%   Time 8   Period Weeks   Status New     PT LONG TERM GOAL #3   Title report 25% decrease in pain   Time 8   Period Weeks   Status New     PT LONG TERM GOAL #4   Title increase LE strength to 4/5   Time 8   Period Weeks   Status New     PT LONG TERM GOAL #5   Title report pain after walking dog 4/10   Time 8   Period Weeks   Status New               Plan - 11/16/16 1114    Clinical Impression Statement core stab with cuing for speed and control and core activation. Progressing with STG of HEP as pt has tendency to over due so taking slow.   PT Next Visit Plan work on the flexibility especially of the quads and hip flexors add core stability as toerated      Patient will benefit from skilled therapeutic intervention in order to improve the following deficits and impairments:  Abnormal gait, Decreased activity tolerance, Decreased  mobility, Decreased range of motion, Decreased strength, Increased muscle spasms, Postural dysfunction, Improper body mechanics, Pain  Visit Diagnosis: Muscle spasm of back  Difficulty in walking, not elsewhere classified  Chronic bilateral low back pain with left-sided sciatica     Problem List Patient Active Problem List   Diagnosis Date Noted  . Chronic pain syndrome 10/24/2016  . High cholesterol 10/24/2016  . Lumbar post-laminectomy syndrome 10/04/2015  . Chronic lumbar radiculopathy 10/04/2015  . PCP NOTES >>>>> 08/07/2015  . Allergic rhinitis 01/21/2015  . Leg edema 12/16/2013  . Weight loss 08/29/2012  . Lumbar canal stenosis 08/20/2012  . Annual physical exam 11/03/2011  . Heart murmur 11/03/2011  . Dermatitis 01/19/2011  . THYROID NODULE 03/15/2010  . PERSONAL HX COLONIC POLYPS 04/30/2008  . FATIGUE 04/17/2008  . Anxiety state 01/27/2008  . Osteoarthritis 01/27/2008  . BAKER'S CYST 12/20/2006  . Essential hypertension 09/11/2005    Statia Burdick,ANGIE PTA 11/16/2016, 11:16 AM  [place], [place], [address] Phone: [phone]   Fax:  [phone]  Name: [name] MRN: [id number] Date of Birth: 1940-11-02

## 2016-11-17 ENCOUNTER — Telehealth: Payer: Self-pay

## 2016-11-17 NOTE — Telephone Encounter (Signed)
UDS: 10/26/2016  Positive for Hydrocodone Negative for Lorazepam: PRN  Low risk per PCP 11/17/2016

## 2016-11-21 ENCOUNTER — Ambulatory Visit: Payer: Medicare Other | Admitting: Physical Therapy

## 2016-11-21 ENCOUNTER — Encounter: Payer: Self-pay | Admitting: Physical Therapy

## 2016-11-21 DIAGNOSIS — M5442 Lumbago with sciatica, left side: Secondary | ICD-10-CM

## 2016-11-21 DIAGNOSIS — G8929 Other chronic pain: Secondary | ICD-10-CM | POA: Diagnosis not present

## 2016-11-21 DIAGNOSIS — R262 Difficulty in walking, not elsewhere classified: Secondary | ICD-10-CM

## 2016-11-21 DIAGNOSIS — M62830 Muscle spasm of back: Secondary | ICD-10-CM

## 2016-11-21 NOTE — Therapy (Signed)
[place], [place], [address] Phone: [phone]   Fax:  [phone]  Physical Therapy Treatment  Patient Details  Name: [name] MRN: [id number] Date of Birth: 10-23-1940 Referring Provider: Rolena Infante  Encounter Date: 11/21/2016      PT End of Session - 11/21/16 1112    Visit Number 5   Date for PT Re-Evaluation 01/05/17   PT Start Time 1100   PT Stop Time 1145   PT Time Calculation (min) 45 min      Past Medical History:  Diagnosis Date  . Adenomatous colon polyp 03/2003  . Anxiety    ativan for sleep  . Baker's cyst   . Degenerative lumbar disc    Dr. Nelva Bush, epidural injections  . Goiter   . History of elevated homocysteine   . Hypertension 09/11/2005  . Lumbar radiculopathy    Dr. Nelva Bush, epidureal injections  . Lumbosacral radiculopathy    seeing Pain Management  . OA (osteoarthritis)     knee pain after TKR left , back pain- last steroid injection back 2 months ago Dr Nelva Bush  . PONV (postoperative nausea and vomiting)   . Postlaminectomy syndrome, lumbar region    Dr. Nelva Bush, recommended doing bilateral S1 transforaminal epidural with D5W  . Thyroid nodule    rt benign colloid nodule--left hyperplastic nodule- Thyroid bx 2006: neg    Past Surgical History:  Procedure Laterality Date  . ABDOMINAL HYSTERECTOMY    . BACK SURGERY  11-09-2011   at [facility], lumbar  . bakers cystectomy     right knee  . FACELIFT    . INCISIONAL HERNIA REPAIR    . KNEE ARTHROSCOPY     L   replacement 2007, Dr Corinne Ports, then a manipulation, then redo TKR 11/09 w/ Dr.Alusio. Parcial replacement again 09-2010  . KNEE SURGERY     x 5-  left  . OOPHORECTOMY    . SHOULDER SURGERY     replacements: L 2010, R 2012  . TOTAL HIP ARTHROPLASTY  3/11   Right   . TOTAL KNEE ARTHROPLASTY  05/20/2012   Procedure: TOTAL KNEE ARTHROPLASTY;  Surgeon: Gearlean Alf, MD;  Location: [facility];  Service: Orthopedics;   Laterality: Right;    There were no vitals filed for this visit.      Subjective Assessment - 11/21/16 1055    Subjective left back pain in specific spot. just getting worse and afraid if I spot moving that will be the end for me   Currently in Pain? Yes   Pain Score 7    Pain Location Back   Pain Orientation Left                         [facility] Adult PT Treatment/Exercise - 11/21/16 0001      Lumbar Exercises: Supine   Other Supine Lumbar Exercises core stab 25 min      Manual Therapy   Manual Therapy Soft tissue mobilization;Passive ROM;Muscle Energy Technique   Manual therapy comments with MH   Soft tissue mobilization LE and back   Passive ROM LE and trunk                  PT Short Term Goals - 11/16/16 1114      PT SHORT TERM GOAL #1   Title independent with initial HEP   Status On-going  PT Long Term Goals - 11/07/16 1104      PT LONG TERM GOAL #1   Title understand proper posture and body mechanics instruction   Time 8   Period Weeks   Status New     PT LONG TERM GOAL #2   Title increase lumbar ROM 25%   Time 8   Period Weeks   Status New     PT LONG TERM GOAL #3   Title report 25% decrease in pain   Time 8   Period Weeks   Status New     PT LONG TERM GOAL #4   Title increase LE strength to 4/5   Time 8   Period Weeks   Status New     PT LONG TERM GOAL #5   Title report pain after walking dog 4/10   Time 8   Period Weeks   Status New               Plan - 11/21/16 1112    Clinical Impression Statement pt very pain limited with stretches and STW but feels it helps. cuing needed with core stab   PT Next Visit Plan assess and progress      Patient will benefit from skilled therapeutic intervention in order to improve the following deficits and impairments:  Abnormal gait, Decreased activity tolerance, Decreased mobility, Decreased range of motion, Decreased strength, Increased muscle spasms,  Postural dysfunction, Improper body mechanics, Pain  Visit Diagnosis: Muscle spasm of back  Difficulty in walking, not elsewhere classified  Chronic bilateral low back pain with left-sided sciatica     Problem List Patient Active Problem List   Diagnosis Date Noted  . Chronic pain syndrome 10/24/2016  . High cholesterol 10/24/2016  . Lumbar post-laminectomy syndrome 10/04/2015  . Chronic lumbar radiculopathy 10/04/2015  . PCP NOTES >>>>> 08/07/2015  . Allergic rhinitis 01/21/2015  . Leg edema 12/16/2013  . Weight loss 08/29/2012  . Lumbar canal stenosis 08/20/2012  . Annual physical exam 11/03/2011  . Heart murmur 11/03/2011  . Dermatitis 01/19/2011  . THYROID NODULE 03/15/2010  . PERSONAL HX COLONIC POLYPS 04/30/2008  . FATIGUE 04/17/2008  . Anxiety state 01/27/2008  . Osteoarthritis 01/27/2008  . BAKER'S CYST 12/20/2006  . Essential hypertension 09/11/2005    Kalisi Bevill,ANGIE PTA 11/21/2016, 11:14 AM  [place], [place], [address] Phone: [phone]   Fax:  [phone]  Name: AMARANTA MEHL MRN: [id number] Date of Birth: 1941/06/17

## 2016-11-23 ENCOUNTER — Ambulatory Visit: Payer: Medicare Other | Admitting: Physical Therapy

## 2016-11-23 ENCOUNTER — Encounter: Payer: Self-pay | Admitting: Physical Therapy

## 2016-11-23 DIAGNOSIS — M62830 Muscle spasm of back: Secondary | ICD-10-CM | POA: Diagnosis not present

## 2016-11-23 DIAGNOSIS — G8929 Other chronic pain: Secondary | ICD-10-CM | POA: Diagnosis not present

## 2016-11-23 DIAGNOSIS — M5442 Lumbago with sciatica, left side: Secondary | ICD-10-CM | POA: Diagnosis not present

## 2016-11-23 DIAGNOSIS — R262 Difficulty in walking, not elsewhere classified: Secondary | ICD-10-CM

## 2016-11-23 NOTE — Therapy (Signed)
[place], [place], [address] Phone: [phone]   Fax:  [phone]  Physical Therapy Treatment  Patient Details  Name: [name] MRN: [id number] Date of Birth: 11-24-1940 Referring Provider: Rolena Infante  Encounter Date: 11/23/2016      PT End of Session - 11/23/16 1118    Visit Number 6   Date for PT Re-Evaluation 01/05/17   PT Start Time 1100   PT Stop Time 1145   PT Time Calculation (min) 45 min      Past Medical History:  Diagnosis Date  . Adenomatous colon polyp 03/2003  . Anxiety    ativan for sleep  . Baker's cyst   . Degenerative lumbar disc    Dr. Nelva Bush, epidural injections  . Goiter   . History of elevated homocysteine   . Hypertension 09/11/2005  . Lumbar radiculopathy    Dr. Nelva Bush, epidureal injections  . Lumbosacral radiculopathy    seeing Pain Management  . OA (osteoarthritis)     knee pain after TKR left , back pain- last steroid injection back 2 months ago Dr Nelva Bush  . PONV (postoperative nausea and vomiting)   . Postlaminectomy syndrome, lumbar region    Dr. Nelva Bush, recommended doing bilateral S1 transforaminal epidural with D5W  . Thyroid nodule    rt benign colloid nodule--left hyperplastic nodule- Thyroid bx 2006: neg    Past Surgical History:  Procedure Laterality Date  . ABDOMINAL HYSTERECTOMY    . BACK SURGERY  11-09-2011   at [facility], lumbar  . bakers cystectomy     right knee  . FACELIFT    . INCISIONAL HERNIA REPAIR    . KNEE ARTHROSCOPY     L   replacement 2007, Dr Corinne Ports, then a manipulation, then redo TKR 11/09 w/ Dr.Alusio. Parcial replacement again 09-2010  . KNEE SURGERY     x 5-  left  . OOPHORECTOMY    . SHOULDER SURGERY     replacements: L 2010, R 2012  . TOTAL HIP ARTHROPLASTY  3/11   Right   . TOTAL KNEE ARTHROPLASTY  05/20/2012   Procedure: TOTAL KNEE ARTHROPLASTY;  Surgeon: Gearlean Alf, MD;  Location: [facility];  Service: Orthopedics;   Laterality: Right;    There were no vitals filed for this visit.      Subjective Assessment - 11/23/16 1104    Subjective hurt so bad after last session I thought I might have to go to hospital. I do not wantto give up though   Currently in Pain? Yes   Pain Score 3    Pain Location Back                         [facility] Adult PT Treatment/Exercise - 11/23/16 0001      Lumbar Exercises: Aerobic   Stationary Bike 6 min     Lumbar Exercises: Standing   Other Standing Lumbar Exercises wall angels 10 times   Other Standing Lumbar Exercises wall push ups 10 times  hip ext 10 each     Lumbar Exercises: Supine   Other Supine Lumbar Exercises core stab 18 min     Manual Therapy   Manual Therapy Passive ROM   Manual therapy comments with MH LE and trunk                PT Education - 11/23/16 1118    Education provided Yes   Education  Details educated on overdoing, ex and activity without overdoing ie walking dog and yard   Person(s) Educated Patient   Methods Explanation;Demonstration   Comprehension Verbalized understanding          PT Short Term Goals - 11/23/16 1118      PT SHORT TERM GOAL #1   Title independent with initial HEP   Status Achieved           PT Long Term Goals - 11/23/16 1120      PT LONG TERM GOAL #1   Title understand proper posture and body mechanics instruction   Status Partially Met     PT LONG TERM GOAL #2   Title increase lumbar ROM 25%   Baseline 50% decreased    Status On-going     PT LONG TERM GOAL #3   Title report 25% decrease in pain   Status On-going     PT LONG TERM GOAL #4   Title increase LE strength to 4/5   Status On-going               Plan - 11/23/16 1126    Clinical Impression Statement less stretches and less aggressive, core ex but less leg ex to see if pain better after. Noted improved upright posture. Educated on activity without overdoing. progressing with goals   PT Next Visit  Plan assess and progress      Patient will benefit from skilled therapeutic intervention in order to improve the following deficits and impairments:  Abnormal gait, Decreased activity tolerance, Decreased mobility, Decreased range of motion, Decreased strength, Increased muscle spasms, Postural dysfunction, Improper body mechanics, Pain  Visit Diagnosis: Muscle spasm of back  Difficulty in walking, not elsewhere classified  Chronic bilateral low back pain with left-sided sciatica     Problem List Patient Active Problem List   Diagnosis Date Noted  . Chronic pain syndrome 10/24/2016  . High cholesterol 10/24/2016  . Lumbar post-laminectomy syndrome 10/04/2015  . Chronic lumbar radiculopathy 10/04/2015  . PCP NOTES >>>>> 08/07/2015  . Allergic rhinitis 01/21/2015  . Leg edema 12/16/2013  . Weight loss 08/29/2012  . Lumbar canal stenosis 08/20/2012  . Annual physical exam 11/03/2011  . Heart murmur 11/03/2011  . Dermatitis 01/19/2011  . THYROID NODULE 03/15/2010  . PERSONAL HX COLONIC POLYPS 04/30/2008  . FATIGUE 04/17/2008  . Anxiety state 01/27/2008  . Osteoarthritis 01/27/2008  . BAKER'S CYST 12/20/2006  . Essential hypertension 09/11/2005    Lolita Faulds,ANGIE PTA 11/23/2016, 11:31 AM  [place], [place], [address] Phone: [phone]   Fax:  [phone]  Name: CADIENCE BRADFIELD MRN: [id number] Date of Birth: 04-Aug-1941

## 2016-11-28 ENCOUNTER — Encounter: Payer: Self-pay | Admitting: Physical Therapy

## 2016-11-28 ENCOUNTER — Other Ambulatory Visit: Payer: Self-pay | Admitting: Internal Medicine

## 2016-11-28 ENCOUNTER — Ambulatory Visit: Payer: Medicare Other | Admitting: Physical Therapy

## 2016-11-28 DIAGNOSIS — M62830 Muscle spasm of back: Secondary | ICD-10-CM | POA: Diagnosis not present

## 2016-11-28 DIAGNOSIS — G8929 Other chronic pain: Secondary | ICD-10-CM | POA: Diagnosis not present

## 2016-11-28 DIAGNOSIS — R262 Difficulty in walking, not elsewhere classified: Secondary | ICD-10-CM

## 2016-11-28 DIAGNOSIS — M5442 Lumbago with sciatica, left side: Secondary | ICD-10-CM | POA: Diagnosis not present

## 2016-11-28 NOTE — Therapy (Signed)
[place], [place], [address] Phone: [phone]   Fax:  [phone]  Physical Therapy Treatment  Patient Details  Name: TZIPPY TESTERMAN MRN: [id number] Date of Birth: [birth year] Referring Provider: Rolena Infante  Encounter Date: 11/28/2016      PT End of Session - 11/28/16 1057    Visit Number 7   Date for PT Re-Evaluation 01/05/17   PT Start Time 1010   PT Stop Time 1100   PT Time Calculation (min) 50 min      Past Medical History:  Diagnosis Date  . Adenomatous colon polyp 03/2003  . Anxiety    ativan for sleep  . Baker's cyst   . Degenerative lumbar disc    Dr. Nelva Bush, epidural injections  . Goiter   . History of elevated homocysteine   . Hypertension 09/11/2005  . Lumbar radiculopathy    Dr. Nelva Bush, epidureal injections  . Lumbosacral radiculopathy    seeing Pain Management  . OA (osteoarthritis)     knee pain after TKR left , back pain- last steroid injection back 2 months ago Dr Nelva Bush  . PONV (postoperative nausea and vomiting)   . Postlaminectomy syndrome, lumbar region    Dr. Nelva Bush, recommended doing bilateral S1 transforaminal epidural with D5W  . Thyroid nodule    rt benign colloid nodule--left hyperplastic nodule- Thyroid bx 2006: neg    Past Surgical History:  Procedure Laterality Date  . ABDOMINAL HYSTERECTOMY    . BACK SURGERY  11-09-2011   at [facility], lumbar  . bakers cystectomy     right knee  . FACELIFT    . INCISIONAL HERNIA REPAIR    . KNEE ARTHROSCOPY     L   replacement 2007, Dr Corinne Ports, then a manipulation, then redo TKR 11/09 w/ Dr.Alusio. Parcial replacement again 09-2010  . KNEE SURGERY     x 5-  left  . OOPHORECTOMY    . SHOULDER SURGERY     replacements: L 2010, R 2012  . TOTAL HIP ARTHROPLASTY  3/11   Right   . TOTAL KNEE ARTHROPLASTY  05/20/2012   Procedure: TOTAL KNEE ARTHROPLASTY;  Surgeon: Gearlean Alf, MD;  Location: [facility];  Service: Orthopedics;   Laterality: Right;    There were no vitals filed for this visit.      Subjective Assessment - 11/28/16 1014    Subjective left LB is root of all the issues. I think PT is doing something   Currently in Pain? Yes   Pain Score 6    Pain Location Back   Pain Orientation Left                         [facility] Adult PT Treatment/Exercise - 11/28/16 0001      Lumbar Exercises: Aerobic   Stationary Bike 6 min   Elliptical Nusteps 6 min     Lumbar Exercises: Seated   Sit to Stand Limitations core stabd seated on sit fit 10 min     Lumbar Exercises: Supine   Other Supine Lumbar Exercises core stabd 20     Manual Therapy   Manual Therapy Passive ROM   Manual therapy comments with MH LE and trunk                  PT Short Term Goals - 11/23/16 1118      PT SHORT TERM GOAL #1   Title independent with  initial HEP   Status Achieved           PT Long Term Goals - 11/28/16 1057      PT LONG TERM GOAL #1   Title understand proper posture and body mechanics instruction   Status Achieved     PT LONG TERM GOAL #2   Title increase lumbar ROM 25%   Status On-going     PT LONG TERM GOAL #3   Title report 25% decrease in pain   Status On-going     PT LONG TERM GOAL #4   Title increase LE strength to 4/5   Status On-going               Plan - 11/28/16 1057    Clinical Impression Statement progressing with goals, good and bad days. tolerating ther ex well but needs cuing for control of mvmt. noted increased upright trunk and not as flexed at hip flexors.   PT Next Visit Plan core stab and stretches      Patient will benefit from skilled therapeutic intervention in order to improve the following deficits and impairments:  Abnormal gait, Decreased activity tolerance, Decreased mobility, Decreased range of motion, Decreased strength, Increased muscle spasms, Postural dysfunction, Improper body mechanics, Pain  Visit Diagnosis: Muscle spasm of  back  Difficulty in walking, not elsewhere classified  Chronic bilateral low back pain with left-sided sciatica     Problem List Patient Active Problem List   Diagnosis Date Noted  . Chronic pain syndrome 10/24/2016  . High cholesterol 10/24/2016  . Lumbar post-laminectomy syndrome 10/04/2015  . Chronic lumbar radiculopathy 10/04/2015  . PCP NOTES >>>>> 08/07/2015  . Allergic rhinitis 01/21/2015  . Leg edema 12/16/2013  . Weight loss 08/29/2012  . Lumbar canal stenosis 08/20/2012  . Annual physical exam 11/03/2011  . Heart murmur 11/03/2011  . Dermatitis 01/19/2011  . THYROID NODULE 03/15/2010  . PERSONAL HX COLONIC POLYPS 04/30/2008  . FATIGUE 04/17/2008  . Anxiety state 01/27/2008  . Osteoarthritis 01/27/2008  . BAKER'S CYST 12/20/2006  . Essential hypertension 09/11/2005    Hudsyn Barich,ANGIE PTA 11/28/2016, 10:59 AM  [place], [place], [address] Phone: [phone]   Fax:  [phone]  Name: SHANTE ARCHAMBEAULT MRN: [id number] Date of Birth: July 14, 1941

## 2016-11-30 ENCOUNTER — Ambulatory Visit: Payer: Medicare Other | Admitting: Physical Therapy

## 2016-12-05 ENCOUNTER — Encounter: Payer: Self-pay | Admitting: Physical Therapy

## 2016-12-05 ENCOUNTER — Ambulatory Visit: Payer: Medicare Other | Admitting: Physical Therapy

## 2016-12-05 DIAGNOSIS — G8929 Other chronic pain: Secondary | ICD-10-CM | POA: Diagnosis not present

## 2016-12-05 DIAGNOSIS — M62830 Muscle spasm of back: Secondary | ICD-10-CM

## 2016-12-05 DIAGNOSIS — M5442 Lumbago with sciatica, left side: Secondary | ICD-10-CM

## 2016-12-05 DIAGNOSIS — R262 Difficulty in walking, not elsewhere classified: Secondary | ICD-10-CM | POA: Diagnosis not present

## 2016-12-05 NOTE — Therapy (Signed)
[place], [place], [address] Phone: [phone]   Fax:  [phone]  Physical Therapy Treatment  Patient Details  Name: [name] MRN: [id number] Date of Birth: [birth year] Referring Provider: Rolena Infante  Encounter Date: 12/05/2016      PT End of Session - 12/05/16 1019    Visit Number 8   Date for PT Re-Evaluation 01/05/17   PT Start Time 4765   PT Stop Time 1100   PT Time Calculation (min) 45 min      Past Medical History:  Diagnosis Date  . Adenomatous colon polyp 03/2003  . Anxiety    ativan for sleep  . Baker's cyst   . Degenerative lumbar disc    Dr. Nelva Bush, epidural injections  . Goiter   . History of elevated homocysteine   . Hypertension 09/11/2005  . Lumbar radiculopathy    Dr. Nelva Bush, epidureal injections  . Lumbosacral radiculopathy    seeing Pain Management  . OA (osteoarthritis)     knee pain after TKR left , back pain- last steroid injection back 2 months ago Dr Nelva Bush  . PONV (postoperative nausea and vomiting)   . Postlaminectomy syndrome, lumbar region    Dr. Nelva Bush, recommended doing bilateral S1 transforaminal epidural with D5W  . Thyroid nodule    rt benign colloid nodule--left hyperplastic nodule- Thyroid bx 2006: neg    Past Surgical History:  Procedure Laterality Date  . ABDOMINAL HYSTERECTOMY    . BACK SURGERY  11-09-2011   at [facility], lumbar  . bakers cystectomy     right knee  . FACELIFT    . INCISIONAL HERNIA REPAIR    . KNEE ARTHROSCOPY     L   replacement 2007, Dr Corinne Ports, then a manipulation, then redo TKR 11/09 w/ Dr.Alusio. Parcial replacement again 09-2010  . KNEE SURGERY     x 5-  left  . OOPHORECTOMY    . SHOULDER SURGERY     replacements: L 2010, R 2012  . TOTAL HIP ARTHROPLASTY  3/11   Right   . TOTAL KNEE ARTHROPLASTY  05/20/2012   Procedure: TOTAL KNEE ARTHROPLASTY;  Surgeon: Gearlean Alf, MD;  Location: [facility];  Service: Orthopedics;   Laterality: Right;    There were no vitals filed for this visit.      Subjective Assessment - 12/05/16 1017    Subjective pretty rough today, nerve pain   Currently in Pain? Yes   Pain Score 7    Pain Location Back                         [facility] Adult PT Treatment/Exercise - 12/05/16 0001      Lumbar Exercises: Aerobic   Stationary Bike 6 min     Lumbar Exercises: Supine   Other Supine Lumbar Exercises core stadb 20 min     Manual Therapy   Manual Therapy Passive ROM   Manual therapy comments with MH LE and trunk   Passive ROM with belt to avoid therapist pressure and make pt more independant                  PT Short Term Goals - 11/23/16 1118      PT SHORT TERM GOAL #1   Title independent with initial HEP   Status Achieved           PT Long Term Goals - 11/28/16 1057  PT LONG TERM GOAL #1   Title understand proper posture and body mechanics instruction   Status Achieved     PT LONG TERM GOAL #2   Title increase lumbar ROM 25%   Status On-going     PT LONG TERM GOAL #3   Title report 25% decrease in pain   Status On-going     PT LONG TERM GOAL #4   Title increase LE strength to 4/5   Status On-going               Plan - 12/05/16 1019    Clinical Impression Statement pt doesnt want to stop but question if PT helping, decrease 1 time week. Trial of self stretch with PTA guidance as pressure hurts pt. limited goal progression d/t up and down.   PT Next Visit Plan Assess      Patient will benefit from skilled therapeutic intervention in order to improve the following deficits and impairments:  Abnormal gait, Decreased activity tolerance, Decreased mobility, Decreased range of motion, Decreased strength, Increased muscle spasms, Postural dysfunction, Improper body mechanics, Pain  Visit Diagnosis: Muscle spasm of back  Difficulty in walking, not elsewhere classified  Chronic bilateral low back pain with  left-sided sciatica     Problem List Patient Active Problem List   Diagnosis Date Noted  . Chronic pain syndrome 10/24/2016  . High cholesterol 10/24/2016  . Lumbar post-laminectomy syndrome 10/04/2015  . Chronic lumbar radiculopathy 10/04/2015  . PCP NOTES >>>>> 08/07/2015  . Allergic rhinitis 01/21/2015  . Leg edema 12/16/2013  . Weight loss 08/29/2012  . Lumbar canal stenosis 08/20/2012  . Annual physical exam 11/03/2011  . Heart murmur 11/03/2011  . Dermatitis 01/19/2011  . THYROID NODULE 03/15/2010  . PERSONAL HX COLONIC POLYPS 04/30/2008  . FATIGUE 04/17/2008  . Anxiety state 01/27/2008  . Osteoarthritis 01/27/2008  . BAKER'S CYST 12/20/2006  . Essential hypertension 09/11/2005    Codi Folkerts,ANGIE PTA 12/05/2016, 10:21 AM  [place], [place], [address] Phone: [phone]   Fax:  [phone]  Name: [name] MRN: [id number] Date of Birth: 1941-06-16

## 2016-12-07 ENCOUNTER — Ambulatory Visit: Payer: Medicare Other | Admitting: Physical Therapy

## 2016-12-12 ENCOUNTER — Ambulatory Visit: Payer: Medicare Other | Admitting: Physical Therapy

## 2016-12-14 ENCOUNTER — Ambulatory Visit: Payer: Medicare Other | Admitting: Physical Therapy

## 2016-12-15 ENCOUNTER — Telehealth: Payer: Self-pay

## 2016-12-15 ENCOUNTER — Encounter: Payer: Self-pay | Admitting: Internal Medicine

## 2016-12-15 ENCOUNTER — Ambulatory Visit (INDEPENDENT_AMBULATORY_CARE_PROVIDER_SITE_OTHER): Payer: Medicare Other | Admitting: Internal Medicine

## 2016-12-15 VITALS — BP 126/78 | HR 65 | Temp 98.1°F | Resp 14 | Ht 65.0 in | Wt 164.4 lb

## 2016-12-15 DIAGNOSIS — J301 Allergic rhinitis due to pollen: Secondary | ICD-10-CM

## 2016-12-15 DIAGNOSIS — R110 Nausea: Secondary | ICD-10-CM | POA: Diagnosis not present

## 2016-12-15 LAB — CBC WITH DIFFERENTIAL/PLATELET
Basophils Absolute: 0 cells/uL (ref 0–200)
Basophils Relative: 0 %
Eosinophils Absolute: 77 cells/uL (ref 15–500)
Eosinophils Relative: 1 %
HCT: 38.3 % (ref 35.0–45.0)
Hemoglobin: 12.8 g/dL (ref 11.7–15.5)
Lymphocytes Relative: 27 %
Lymphs Abs: 2079 cells/uL (ref 850–3900)
MCH: 29 pg (ref 27.0–33.0)
MCHC: 33.4 g/dL (ref 32.0–36.0)
MCV: 86.8 fL (ref 80.0–100.0)
MPV: 9.4 fL (ref 7.5–12.5)
Monocytes Absolute: 693 cells/uL (ref 200–950)
Monocytes Relative: 9 %
Neutro Abs: 4851 cells/uL (ref 1500–7800)
Neutrophils Relative %: 63 %
Platelets: 293 10*3/uL (ref 140–400)
RBC: 4.41 MIL/uL (ref 3.80–5.10)
RDW: 12.5 % (ref 11.0–15.0)
WBC: 7.7 10*3/uL (ref 3.8–10.8)

## 2016-12-15 MED ORDER — ONDANSETRON HCL 4 MG PO TABS: 4.0000 mg | ORAL_TABLET | Freq: Three times a day (TID) | ORAL | 0 refills | Status: DC | PRN

## 2016-12-15 NOTE — Progress Notes (Signed)
Subjective:    Patient ID: [name], female    DOB: 08-04-1941, 76 y.o.   MRN: [id number]  DOS:  12/15/2016 Type of visit - description : acute  Interval history:  No filling well for one week: Decrease appetite, mild but persistent nausea, worse with eating. Did have vomiting 2 or 3 times at first but that is resolved.  Also for the last 3 or 4 days is having some hoarseness, postnasal dripping and mild bilateral sinus congestion. Unable to blow anything from her nose.   Review of Systems  Denies fever, chills. No cough or chest congestion. No recent head injury.  Denies taking any NSAIDs. No blood in the stools, diarrhea. No abdominal pain. Mild constipation  which is not new for her.   Past Medical History:  Diagnosis Date  . Adenomatous colon polyp 03/2003  . Anxiety    ativan for sleep  . Baker's cyst   . Degenerative lumbar disc    Dr. Nelva Bush, epidural injections  . Goiter   . History of elevated homocysteine   . Hypertension 09/11/2005  . Lumbar radiculopathy    Dr. Nelva Bush, epidureal injections  . Lumbosacral radiculopathy    seeing Pain Management  . OA (osteoarthritis)     knee pain after TKR left , back pain- last steroid injection back 2 months ago Dr Nelva Bush  . PONV (postoperative nausea and vomiting)   . Postlaminectomy syndrome, lumbar region    Dr. Nelva Bush, recommended doing bilateral S1 transforaminal epidural with D5W  . Thyroid nodule    rt benign colloid nodule--left hyperplastic nodule- Thyroid bx 2006: neg    Past Surgical History:  Procedure Laterality Date  . ABDOMINAL HYSTERECTOMY    . BACK SURGERY  11-09-2011   at [facility], lumbar  . bakers cystectomy     right knee  . FACELIFT    . INCISIONAL HERNIA REPAIR    . KNEE ARTHROSCOPY     L   replacement 2007, Dr Corinne Ports, then a manipulation, then redo TKR 11/09 w/ Dr.Alusio. Parcial replacement again 09-2010  . KNEE SURGERY     x 5-  left  . OOPHORECTOMY    . SHOULDER SURGERY     replacements: L  2010, R 2012  . TOTAL HIP ARTHROPLASTY  3/11   Right   . TOTAL KNEE ARTHROPLASTY  05/20/2012   Procedure: TOTAL KNEE ARTHROPLASTY;  Surgeon: Gearlean Alf, MD;  Location: [facility];  Service: Orthopedics;  Laterality: Right;    Social History   Social History  . Marital status: Married    Spouse name: N/A  . Number of children: 2  . Years of education: N/A   Occupational History  . Retired Pharmacist, hospital     retired  .  Retired   Social History Main Topics  . Smoking status: Former Smoker    Quit date: 09/12/1983  . Smokeless tobacco: Never Used  . Alcohol use 0.0 oz/week     Comment: occasional   . Drug use: No  . Sexual activity: Not on file   Other Topics Concern  . Not on file   Social History Narrative   Lives w/ husband of 56 years in a 3 story home. Has 2 sons.  Retired Education officer, museum.  Education: college.               Allergies as of 12/15/2016      Reactions   Tramadol Hives, Swelling   Per Pt she can take w/o reactions  Oxycodone-acetaminophen Nausea Only, Other (See Comments)   Headaches      Medication List       Accurate as of 12/15/16 11:59 PM. Always use your most recent med list.          aspirin 81 MG tablet Take 81 mg by mouth daily.   CALCIUM + D PO Take 600 mg by mouth daily after breakfast.   folic acid 1 MG tablet Commonly known as:  FOLVITE Take 2 tablets (2 mg total) by mouth daily.   gabapentin 800 MG tablet Commonly known as:  NEURONTIN TAKE 1 TABLET(800 MG) BY MOUTH FOUR TIMES DAILY   HYDROcodone-acetaminophen 5-325 MG tablet Commonly known as:  NORCO/VICODIN Take 1 tablet by mouth 2 (two) times daily as needed.   hydrocortisone 2.5 % cream   lidocaine 5 % Commonly known as:  LIDODERM Place 1 patch onto the skin daily. Remove and discard patch within 12 hours or as directed by MD.   lisinopril-hydrochlorothiazide 20-12.5 MG tablet Commonly known as:  PRINZIDE,ZESTORETIC Take 1 tablet by mouth daily.   LORazepam 0.5 MG  tablet Commonly known as:  ATIVAN Take 1-2 tablets (0.5-1 mg total) by mouth at bedtime as needed for sleep.   ondansetron 4 MG tablet Commonly known as:  ZOFRAN Take 1 tablet (4 mg total) by mouth every 8 (eight) hours as needed for nausea or vomiting.   simvastatin 20 MG tablet Commonly known as:  ZOCOR Take 1 tablet (20 mg total) by mouth daily.   tretinoin 0.1 % cream Commonly known as:  RETIN-A   TYLENOL PO Take by mouth as needed.          Objective:   Physical Exam BP 126/78 (BP Location: Left Arm, Patient Position: Sitting, Cuff Size: Normal)   Pulse 65   Temp 98.1 F (36.7 C) (Oral)   Resp 14   Ht 5\' 5"  (1.651 m)   Wt 164 lb 6 oz (74.6 kg)   BMI 27.35 kg/m  General:   Well developed, well nourished . NAD.  HEENT:  Normocephalic . Face symmetric, atraumatic. Nose is slightly congested, sinuses: Mildly TTP bilaterally, slightly worse at the right maxillary area. Throat symmetric. TMs normal. Lungs:  CTA B Normal respiratory effort, no intercostal retractions, no accessory muscle use. Heart: RRR,  no murmur.  no pretibial edema bilaterally  Abdomen:  Not distended, soft, non-tender. No rebound or rigidity.  Skin: Not pale. Not jaundice Neurologic:  alert & oriented X3.  Speech normal, gait appropriate for age and unassisted Psych--  Cognition and judgment appear intact.  Cooperative with normal attention span and concentration.  Behavior appropriate. No anxious or depressed appearing.    Assessment & Plan:   Assessment HTN Hyperlipidemia Anxiety, insomnia, ativan rx per pcp Elevated homocysteine Goiter  Thyroid nodule BX 2006 neg, [place] 2013 stable [place] 02-2016 -- bx 03-2016 benign  MSK: sees pain mgmt ---DJD . ---Postlaminectomy syndrome, back surgery 2013  ---Intolerant to Lyrica, gabapentin helps --hydrocodone rx elsewhere --other dx: Neuropathy, chronic pain syndrome  Plan: Nausea: Mild nausea for few days, no red flag symptoms, abdominal  exam is benign. Some people in the community had acute gastroenteritis this last 10 days, viral issue?. Recommend Zofran, will check a CBC, good hydration, call if no better. Allergic rhinitis:  sx started 3 days ago, likely allergies, doubt sinusitis. Conservative treatment with flonase, Claritin. Call if not improving.

## 2016-12-15 NOTE — Telephone Encounter (Signed)
PA initiated via Covermymeds; KEY: [place]. Awaiting determination.

## 2016-12-15 NOTE — Patient Instructions (Signed)
For allergies: Take OTC Claritin 1 tablet daily until better Use OTC Flonase 2 sprays in each side of the nose every morning until better  For nausea: You probably have a virus Bland diet for 5 days Zofran as needed for nausea Call if not gradually improving Call if severe symptoms, stomach pain, fever chills, or blood in the stools.

## 2016-12-15 NOTE — Progress Notes (Signed)
Pre visit review using our clinic review tool, if applicable. No additional management support is needed unless otherwise documented below in the visit note. 

## 2016-12-16 NOTE — Assessment & Plan Note (Signed)
Nausea: Mild nausea for few days, no red flag symptoms, abdominal exam is benign. Some people in the community had acute gastroenteritis this last 10 days, viral issue?. Recommend Zofran, will check a CBC, good hydration, call if no better. Allergic rhinitis:  sx started 3 days ago, likely allergies, doubt sinusitis. Conservative treatment with flonase, Claritin. Call if not improving.

## 2016-12-18 NOTE — Telephone Encounter (Signed)
Received telephone call from [place] at [place] denied.

## 2016-12-19 DIAGNOSIS — M5416 Radiculopathy, lumbar region: Secondary | ICD-10-CM | POA: Diagnosis not present

## 2016-12-19 DIAGNOSIS — M5136 Other intervertebral disc degeneration, lumbar region: Secondary | ICD-10-CM | POA: Diagnosis not present

## 2016-12-19 DIAGNOSIS — Z79891 Long term (current) use of opiate analgesic: Secondary | ICD-10-CM | POA: Diagnosis not present

## 2016-12-19 DIAGNOSIS — M961 Postlaminectomy syndrome, not elsewhere classified: Secondary | ICD-10-CM | POA: Diagnosis not present

## 2016-12-21 ENCOUNTER — Ambulatory Visit: Payer: Medicare Other | Admitting: Physical Therapy

## 2016-12-22 NOTE — Telephone Encounter (Signed)
Received PA denial notification letter. Letter sent for scanning.

## 2016-12-28 ENCOUNTER — Encounter: Payer: Self-pay | Admitting: Physical Therapy

## 2016-12-28 ENCOUNTER — Ambulatory Visit: Payer: Medicare Other | Attending: Orthopedic Surgery | Admitting: Physical Therapy

## 2016-12-28 DIAGNOSIS — G8929 Other chronic pain: Secondary | ICD-10-CM | POA: Diagnosis not present

## 2016-12-28 DIAGNOSIS — R262 Difficulty in walking, not elsewhere classified: Secondary | ICD-10-CM

## 2016-12-28 DIAGNOSIS — M62830 Muscle spasm of back: Secondary | ICD-10-CM | POA: Diagnosis not present

## 2016-12-28 DIAGNOSIS — M5442 Lumbago with sciatica, left side: Secondary | ICD-10-CM | POA: Insufficient documentation

## 2016-12-28 NOTE — Therapy (Addendum)
[place], [place], [address] Phone: [phone]   Fax:  [phone]  Physical Therapy Treatment  Patient Details  Name: [name] MRN: [id number] Date of Birth: 08/29/1941 Referring Provider: Rolena Infante  Encounter Date: 12/28/2016      PT End of Session - 12/28/16 0954    Visit Number 9   Date for PT Re-Evaluation 01/05/17   PT Start Time 0915   PT Stop Time 0950   PT Time Calculation (min) 35 min      Past Medical History:  Diagnosis Date  . Adenomatous colon polyp 03/2003  . Anxiety    ativan for sleep  . Baker's cyst   . Degenerative lumbar disc    Dr. Nelva Bush, epidural injections  . Goiter   . History of elevated homocysteine   . Hypertension 09/11/2005  . Lumbar radiculopathy    Dr. Nelva Bush, epidureal injections  . Lumbosacral radiculopathy    seeing Pain Management  . OA (osteoarthritis)     knee pain after TKR left , back pain- last steroid injection back 2 months ago Dr Nelva Bush  . PONV (postoperative nausea and vomiting)   . Postlaminectomy syndrome, lumbar region    Dr. Nelva Bush, recommended doing bilateral S1 transforaminal epidural with D5W  . Thyroid nodule    rt benign colloid nodule--left hyperplastic nodule- Thyroid bx 2006: neg    Past Surgical History:  Procedure Laterality Date  . ABDOMINAL HYSTERECTOMY    . BACK SURGERY  11-09-2011   at [facility], lumbar  . bakers cystectomy     right knee  . FACELIFT    . INCISIONAL HERNIA REPAIR    . KNEE ARTHROSCOPY     L   replacement 2007, Dr Corinne Ports, then a manipulation, then redo TKR 11/09 w/ Dr.Alusio. Parcial replacement again 09-2010  . KNEE SURGERY     x 5-  left  . OOPHORECTOMY    . SHOULDER SURGERY     replacements: L 2010, R 2012  . TOTAL HIP ARTHROPLASTY  3/11   Right   . TOTAL KNEE ARTHROPLASTY  05/20/2012   Procedure: TOTAL KNEE ARTHROPLASTY;  Surgeon: Gearlean Alf, MD;  Location: [facility];  Service: Orthopedics;   Laterality: Right;    There were no vitals filed for this visit.      Subjective Assessment - 12/28/16 0950    Subjective I was doing pretty good until I planted flowers and really got bad for a couple days- thought I was being careful?   Currently in Pain? Yes   Pain Score 6    Pain Location Back                         [facility] Adult PT Treatment/Exercise - 12/28/16 0001      Moist Heat Therapy   Number Minutes Moist Heat 15 Minutes   Moist Heat Location Lumbar Spine;Hip     Manual Therapy   Manual Therapy Passive ROM   Passive ROM trunk and LE                PT Education - 12/28/16 0953    Education provided Yes   Education Details educated on proper BM with lifting and stooping but advised not to lift over 5 # ( bag of soil too heavy)   Person(s) Educated Patient   Methods Explanation   Comprehension Verbalized understanding  PT Short Term Goals - 11/23/16 1118      PT SHORT TERM GOAL #1   Title independent with initial HEP   Status Achieved           PT Long Term Goals - 12/28/16 0954      PT LONG TERM GOAL #1   Title understand proper posture and body mechanics instruction   Baseline VU but not always following with ADL   Status Achieved     PT LONG TERM GOAL #2   Title increase lumbar ROM 25%   Status Achieved     PT LONG TERM GOAL #3   Title report 25% decrease in pain   Status On-going     PT LONG TERM GOAL #4   Title increase LE strength to 4/5   Baseline hips 4-/5   Status On-going               Plan - 12/28/16 0955    Clinical Impression Statement pt verb all she wanted oday was stretching with MH d/t increased pain earlier in week from overdoing at home planting flowers. Pt tolerated stretching much better today and was noteably looser, only c/o pain was with RT hip flexor stretch. Slow progression with goals   PT Next Visit Plan Next visit GCode #10 and Renewal vs D/C      Patient will  benefit from skilled therapeutic intervention in order to improve the following deficits and impairments:  Abnormal gait, Decreased activity tolerance, Decreased mobility, Decreased range of motion, Decreased strength, Increased muscle spasms, Postural dysfunction, Improper body mechanics, Pain  Visit Diagnosis: Muscle spasm of back  Difficulty in walking, not elsewhere classified  Chronic bilateral low back pain with left-sided sciatica     Problem List Patient Active Problem List   Diagnosis Date Noted  . Chronic pain syndrome 10/24/2016  . High cholesterol 10/24/2016  . Lumbar post-laminectomy syndrome 10/04/2015  . Chronic lumbar radiculopathy 10/04/2015  . PCP NOTES >>>>> 08/07/2015  . Allergic rhinitis 01/21/2015  . Leg edema 12/16/2013  . Weight loss 08/29/2012  . Lumbar canal stenosis 08/20/2012  . Annual physical exam 11/03/2011  . Heart murmur 11/03/2011  . Dermatitis 01/19/2011  . THYROID NODULE 03/15/2010  . PERSONAL HX COLONIC POLYPS 04/30/2008  . FATIGUE 04/17/2008  . Anxiety state 01/27/2008  . Osteoarthritis 01/27/2008  . BAKER'S CYST 12/20/2006  . Essential hypertension 09/11/2005  PHYSICAL THERAPY DISCHARGE SUMMARY   Plan: Patient agrees to discharge.  Patient goals were not met. Patient is being discharged due to a change in medical status.  ?????      Velvia Mehrer,ANGIE PTA 12/28/2016, 9:58 AM  [place], [place], [address] Phone: [phone]   Fax:  [phone]  Name: [name] MRN: [id number] Date of Birth: Aug 05, 1941

## 2017-01-22 ENCOUNTER — Encounter: Payer: Self-pay | Admitting: Internal Medicine

## 2017-01-22 ENCOUNTER — Ambulatory Visit (HOSPITAL_BASED_OUTPATIENT_CLINIC_OR_DEPARTMENT_OTHER)
Admission: RE | Admit: 2017-01-22 | Discharge: 2017-01-22 | Disposition: A | Discharge status: Home / Self Care | Payer: Medicare Other | Source: Ambulatory Visit | Attending: Internal Medicine | Admitting: Internal Medicine

## 2017-01-22 ENCOUNTER — Ambulatory Visit (INDEPENDENT_AMBULATORY_CARE_PROVIDER_SITE_OTHER): Payer: Medicare Other | Admitting: Internal Medicine

## 2017-01-22 VITALS — BP 118/76 | HR 77 | Temp 97.8°F | Resp 14 | Ht 65.0 in | Wt 153.4 lb

## 2017-01-22 DIAGNOSIS — R634 Abnormal weight loss: Secondary | ICD-10-CM

## 2017-01-22 DIAGNOSIS — E0590 Thyrotoxicosis, unspecified without thyrotoxic crisis or storm: Secondary | ICD-10-CM

## 2017-01-22 DIAGNOSIS — E041 Nontoxic single thyroid nodule: Secondary | ICD-10-CM

## 2017-01-22 DIAGNOSIS — R399 Unspecified symptoms and signs involving the genitourinary system: Secondary | ICD-10-CM

## 2017-01-22 NOTE — Progress Notes (Signed)
Subjective:    Patient ID: [name], female    DOB: 12-Nov-1940, 76 y.o.   MRN: [id number]  DOS:  01/22/2017 Type of visit - description : acute Interval history: About 3 weeks ago, she become aware that she was losing weight. About 10 pounds per her scale. She actually is feeling well, her chronic pain is at baseline. Not taking any new medication. She is a former smoker but smoked  back in 1960s.  Wt Readings from Last 3 Encounters:  01/22/17 153 lb 6 oz (69.6 kg)  12/15/16 164 lb 6 oz (74.6 kg)  10/24/16 174 lb 6 oz (79.1 kg)    Review of Systems Denies fever, chills, night sweats No chest pain difficulty breathing or palpitations. No tremors No nausea, vomiting, diarrhea or blood in the stools. No cough No polydipsia, polyuria?Marland Kitchen No headaches. No anxiety or depression.   Past Medical History:  Diagnosis Date  . Adenomatous colon polyp 03/2003  . Anxiety    ativan for sleep  . Baker's cyst   . Degenerative lumbar disc    Dr. Nelva Bush, epidural injections  . Goiter   . History of elevated homocysteine   . Hypertension 09/11/2005  . Lumbar radiculopathy    Dr. Nelva Bush, epidureal injections  . Lumbosacral radiculopathy    seeing Pain Management  . OA (osteoarthritis)     knee pain after TKR left , back pain- last steroid injection back 2 months ago Dr Nelva Bush  . PONV (postoperative nausea and vomiting)   . Postlaminectomy syndrome, lumbar region    Dr. Nelva Bush, recommended doing bilateral S1 transforaminal epidural with D5W  . Thyroid nodule    rt benign colloid nodule--left hyperplastic nodule- Thyroid bx 2006: neg    Past Surgical History:  Procedure Laterality Date  . ABDOMINAL HYSTERECTOMY    . BACK SURGERY  11-09-2011   at [facility], lumbar  . bakers cystectomy     right knee  . FACELIFT    . INCISIONAL HERNIA REPAIR    . KNEE ARTHROSCOPY     L   replacement 2007, Dr Corinne Ports, then a manipulation, then redo TKR 11/09 w/ Dr.Alusio. Parcial replacement again 09-2010    . KNEE SURGERY     x 5-  left  . OOPHORECTOMY    . SHOULDER SURGERY     replacements: L 2010, R 2012  . TOTAL HIP ARTHROPLASTY  3/11   Right   . TOTAL KNEE ARTHROPLASTY  05/20/2012   Procedure: TOTAL KNEE ARTHROPLASTY;  Surgeon: Gearlean Alf, MD;  Location: [facility];  Service: Orthopedics;  Laterality: Right;    Social History   Social History  . Marital status: Married    Spouse name: N/A  . Number of children: 2  . Years of education: N/A   Occupational History  . Retired Pharmacist, hospital     retired  .  Retired   Social History Main Topics  . Smoking status: Former Smoker    Quit date: 09/12/1983  . Smokeless tobacco: Never Used  . Alcohol use 0.0 oz/week     Comment: occasional   . Drug use: No  . Sexual activity: Not on file   Other Topics Concern  . Not on file   Social History Narrative   Lives w/ husband of 8 years in a 3 story home. Has 2 sons.  Retired Education officer, museum.  Education: college.               Allergies as of 01/22/2017  Reactions   Tramadol Hives, Swelling   Per Pt she can take w/o reactions   Oxycodone-acetaminophen Nausea Only, Other (See Comments)   Headaches      Medication List       Accurate as of 01/22/17 11:59 PM. Always use your most recent med list.          aspirin 81 MG tablet Take 81 mg by mouth daily.   CALCIUM + D PO Take 600 mg by mouth daily after breakfast.   folic acid 1 MG tablet Commonly known as:  FOLVITE Take 2 tablets (2 mg total) by mouth daily.   gabapentin 800 MG tablet Commonly known as:  NEURONTIN TAKE 1 TABLET(800 MG) BY MOUTH FOUR TIMES DAILY   HYDROcodone-acetaminophen 5-325 MG tablet Commonly known as:  NORCO/VICODIN Take 1 tablet by mouth 2 (two) times daily as needed.   hydrocortisone 2.5 % cream   lidocaine 5 % Commonly known as:  LIDODERM Place 1 patch onto the skin daily. Remove and discard patch within 12 hours or as directed by MD.   lisinopril-hydrochlorothiazide 20-12.5 MG  tablet Commonly known as:  PRINZIDE,ZESTORETIC Take 1 tablet by mouth daily.   LORazepam 0.5 MG tablet Commonly known as:  ATIVAN Take 1-2 tablets (0.5-1 mg total) by mouth at bedtime as needed for sleep.   ondansetron 4 MG tablet Commonly known as:  ZOFRAN Take 1 tablet (4 mg total) by mouth every 8 (eight) hours as needed for nausea or vomiting.   simvastatin 20 MG tablet Commonly known as:  ZOCOR Take 1 tablet (20 mg total) by mouth daily.   tretinoin 0.1 % cream Commonly known as:  RETIN-A   TYLENOL PO Take by mouth as needed.          Objective:   Physical Exam BP 118/76 (BP Location: Left Arm, Patient Position: Sitting, Cuff Size: Small)   Pulse 77   Temp 97.8 F (36.6 C) (Oral)   Resp 14   Ht 5\' 5"  (1.651 m)   Wt 153 lb 6 oz (69.6 kg)   SpO2 98%   BMI 25.52 kg/m  General:   Well developed, well nourished . NAD.  HEENT:  Normocephalic . Face symmetric, atraumatic Neck: She has a well known thyroid nodule on the right, nontender. Lungs:  CTA B Normal respiratory effort, no intercostal retractions, no accessory muscle use. Heart: RRR,  no murmur.  no pretibial edema bilaterally  Abdomen:  Not distended, soft, non-tender. No rebound or rigidity.  Skin: Not pale. Not jaundice Lymph nodes: No pathological lymph nodes in the neck, axillary areas or groins. Neurologic:  alert & oriented X3.  Speech normal, gait appropriate for age and unassisted Psych--  Cognition and judgment appear intact.  Cooperative with normal attention span and concentration.  Behavior appropriate. No anxious or depressed appearing.    Assessment & Plan:   Assessment HTN Hyperlipidemia Anxiety, insomnia, ativan rx per pcp Elevated homocysteine Goiter  Thyroid nodule BX 2006 neg, [place] 2013 stable [place] 02-2016 -- bx 03-2016 benign  MSK: sees pain mgmt ---DJD . ---Postlaminectomy syndrome, back surgery 2013  ---Intolerant to Lyrica, gabapentin helps --hydrocodone rx  elsewhere --other dx: Neuropathy, chronic pain syndrome   Plan: Weight loss: Acute, noted for the last 3 weeks, 10 pounds per her scales, 11 pounds our scales. No worrisome symptoms, exam is benign, has well known thyroid nodule. Plan: CMP, CBC, TSH, free T3, free T4, sedimentation rate, UA, chest x-ray. Thyroid nodule: As above Reassess in 4 weeks.

## 2017-01-22 NOTE — Patient Instructions (Signed)
GO TO THE LAB : Get the blood work     GO TO THE FRONT DESK Schedule your next appointment for a  follow-up in 4 weeks     STOP BY THE FIRST FLOOR:  get the XR

## 2017-01-22 NOTE — Progress Notes (Signed)
Pre visit review using our clinic review tool, if applicable. No additional management support is needed unless otherwise documented below in the visit note. 

## 2017-01-23 LAB — CBC WITH DIFFERENTIAL/PLATELET
Basophils Absolute: 0.1 10*3/uL (ref 0.0–0.1)
Basophils Relative: 0.7 % (ref 0.0–3.0)
Eosinophils Absolute: 0.1 10*3/uL (ref 0.0–0.7)
Eosinophils Relative: 1.5 % (ref 0.0–5.0)
HCT: 37.3 % (ref 36.0–46.0)
Hemoglobin: 12.2 g/dL (ref 12.0–15.0)
Lymphocytes Relative: 33.8 % (ref 12.0–46.0)
Lymphs Abs: 2.8 10*3/uL (ref 0.7–4.0)
MCHC: 32.6 g/dL (ref 30.0–36.0)
MCV: 87.3 fl (ref 78.0–100.0)
Monocytes Absolute: 0.9 10*3/uL (ref 0.1–1.0)
Monocytes Relative: 10.4 % (ref 3.0–12.0)
Neutro Abs: 4.5 10*3/uL (ref 1.4–7.7)
Neutrophils Relative %: 53.6 % (ref 43.0–77.0)
Platelets: 343 10*3/uL (ref 150.0–400.0)
RBC: 4.28 Mil/uL (ref 3.87–5.11)
RDW: 13.1 % (ref 11.5–15.5)
WBC: 8.4 10*3/uL (ref 4.0–10.5)

## 2017-01-23 LAB — TSH: TSH: <0.01 u[IU]/mL — ABNORMAL LOW (ref 0.35–4.50)

## 2017-01-23 LAB — COMPREHENSIVE METABOLIC PANEL WITH GFR
ALT: 13 U/L (ref 0–35)
AST: 22 U/L (ref 0–37)
Albumin: 4.2 g/dL (ref 3.5–5.2)
Alkaline Phosphatase: 68 U/L (ref 39–117)
BUN: 20 mg/dL (ref 6–23)
CO2: 30 meq/L (ref 19–32)
Calcium: 10.2 mg/dL (ref 8.4–10.5)
Chloride: 100 meq/L (ref 96–112)
Creatinine, Ser: 0.82 mg/dL (ref 0.40–1.20)
GFR: 72.02 mL/min
Glucose, Bld: 87 mg/dL (ref 70–99)
Potassium: 4.4 meq/L (ref 3.5–5.1)
Sodium: 137 meq/L (ref 135–145)
Total Bilirubin: 0.4 mg/dL (ref 0.2–1.2)
Total Protein: 6.5 g/dL (ref 6.0–8.3)

## 2017-01-23 LAB — URINALYSIS, ROUTINE W REFLEX MICROSCOPIC
Bilirubin Urine: NEGATIVE
Hgb urine dipstick: NEGATIVE
Ketones, ur: NEGATIVE
Nitrite: NEGATIVE
Specific Gravity, Urine: >=1.03 — AB (ref 1.000–1.030)
Total Protein, Urine: NEGATIVE
Urine Glucose: NEGATIVE
Urobilinogen, UA: 0.2 (ref 0.0–1.0)
pH: 5.5 (ref 5.0–8.0)

## 2017-01-23 LAB — SEDIMENTATION RATE: Sed Rate: 5 mm/hr (ref 0–30)

## 2017-01-23 LAB — T4, FREE: Free T4: 3.42 ng/dL — ABNORMAL HIGH (ref 0.60–1.60)

## 2017-01-23 LAB — T3, FREE: T3, Free: 8.7 pg/mL — ABNORMAL HIGH (ref 2.3–4.2)

## 2017-01-23 NOTE — Assessment & Plan Note (Addendum)
Weight loss: Acute, noted for the last 3 weeks, 10 pounds per her scales, 11 pounds our scales. No worrisome symptoms, exam is benign, has well known thyroid nodule. Plan: CMP, CBC, TSH, free T3, free T4, sedimentation rate, UA, chest x-ray. Thyroid nodule: As above Reassess in 4 weeks.

## 2017-01-25 ENCOUNTER — Other Ambulatory Visit: Payer: Self-pay | Admitting: Endocrinology

## 2017-01-25 ENCOUNTER — Ambulatory Visit (INDEPENDENT_AMBULATORY_CARE_PROVIDER_SITE_OTHER): Payer: Medicare Other | Admitting: Endocrinology

## 2017-01-25 ENCOUNTER — Encounter: Payer: Self-pay | Admitting: Endocrinology

## 2017-01-25 ENCOUNTER — Other Ambulatory Visit: Payer: Self-pay | Admitting: Internal Medicine

## 2017-01-25 ENCOUNTER — Telehealth: Payer: Self-pay | Admitting: Internal Medicine

## 2017-01-25 VITALS — BP 104/62 | HR 62 | Ht 65.0 in | Wt 153.0 lb

## 2017-01-25 DIAGNOSIS — E041 Nontoxic single thyroid nodule: Secondary | ICD-10-CM | POA: Diagnosis not present

## 2017-01-25 DIAGNOSIS — R9389 Abnormal findings on diagnostic imaging of other specified body structures: Secondary | ICD-10-CM

## 2017-01-25 DIAGNOSIS — E0590 Thyrotoxicosis, unspecified without thyrotoxic crisis or storm: Secondary | ICD-10-CM

## 2017-01-25 MED ORDER — METHIMAZOLE 10 MG PO TABS: 10.0000 mg | ORAL_TABLET | Freq: Two times a day (BID) | ORAL | 1 refills | Status: DC

## 2017-01-25 NOTE — Progress Notes (Signed)
Subjective:    Patient ID: [name], female    DOB: 05/30/1941, 76 y.o.   MRN: [id number]  HPI Pt is referred by Dr Larose Kells, for hyperthyroidism.  Pt reports she was dx'ed with multinodular goiter in 2007, and hyperthyroidism in 2018.  she has never been on therapy for this.  she has never had XRT to the anterior neck, or thyroid surgery.  she does not consume kelp or any other prescribed or non-prescribed thyroid medication.  she has never been on amiodarone.  She has slight hoarseness sensation in the neck, and assoc slight weight loss.   Past Medical History:  Diagnosis Date  . Adenomatous colon polyp 03/2003  . Anxiety    ativan for sleep  . Baker's cyst   . Degenerative lumbar disc    Dr. Nelva Bush, epidural injections  . Goiter   . History of elevated homocysteine   . Hypertension 09/11/2005  . Lumbar radiculopathy    Dr. Nelva Bush, epidureal injections  . Lumbosacral radiculopathy    seeing Pain Management  . OA (osteoarthritis)     knee pain after TKR left , back pain- last steroid injection back 2 months ago Dr Nelva Bush  . PONV (postoperative nausea and vomiting)   . Postlaminectomy syndrome, lumbar region    Dr. Nelva Bush, recommended doing bilateral S1 transforaminal epidural with D5W  . Thyroid nodule    rt benign colloid nodule--left hyperplastic nodule- Thyroid bx 2006: neg    Past Surgical History:  Procedure Laterality Date  . ABDOMINAL HYSTERECTOMY    . BACK SURGERY  11-09-2011   at [facility], lumbar  . bakers cystectomy     right knee  . FACELIFT    . INCISIONAL HERNIA REPAIR    . KNEE ARTHROSCOPY     L   replacement 2007, Dr Corinne Ports, then a manipulation, then redo TKR 11/09 w/ Dr.Alusio. Parcial replacement again 09-2010  . KNEE SURGERY     x 5-  left  . OOPHORECTOMY    . SHOULDER SURGERY     replacements: L 2010, R 2012  . TOTAL HIP ARTHROPLASTY  3/11   Right   . TOTAL KNEE ARTHROPLASTY  05/20/2012   Procedure: TOTAL KNEE ARTHROPLASTY;  Surgeon: Gearlean Alf, MD;   Location: [facility];  Service: Orthopedics;  Laterality: Right;    Social History   Social History  . Marital status: Married    Spouse name: N/A  . Number of children: 2  . Years of education: N/A   Occupational History  . Retired Pharmacist, hospital     retired  .  Retired   Social History Main Topics  . Smoking status: Former Smoker    Quit date: 09/12/1983  . Smokeless tobacco: Never Used  . Alcohol use 0.0 oz/week     Comment: occasional   . Drug use: No  . Sexual activity: Not on file   Other Topics Concern  . Not on file   Social History Narrative   Lives w/ husband of 5 years in a 3 story home. Has 2 sons.  Retired Education officer, museum.  Education: college.             Current Outpatient Prescriptions on File Prior to Visit  Medication Sig Dispense Refill  . aspirin 81 MG tablet Take 81 mg by mouth daily.    . Calcium Carbonate-Vitamin D (CALCIUM + D PO) Take 600 mg by mouth daily after breakfast.    . folic acid (FOLVITE) 1 MG tablet Take  2 tablets (2 mg total) by mouth daily. 60 tablet 12  . gabapentin (NEURONTIN) 800 MG tablet TAKE 1 TABLET(800 MG) BY MOUTH FOUR TIMES DAILY 120 tablet 0  . HYDROcodone-acetaminophen (NORCO/VICODIN) 5-325 MG tablet Take 1 tablet by mouth 2 (two) times daily as needed.    . hydrocortisone 2.5 % cream     . lisinopril-hydrochlorothiazide (PRINZIDE,ZESTORETIC) 20-12.5 MG tablet Take 1 tablet by mouth daily. 90 tablet 2  . LORazepam (ATIVAN) 0.5 MG tablet Take 1-2 tablets (0.5-1 mg total) by mouth at bedtime as needed for sleep. 45 tablet 2  . tretinoin (RETIN-A) 0.1 % cream   4   No current facility-administered medications on file prior to visit.     Allergies  Allergen Reactions  . Tramadol Hives and Swelling    Per Pt she can take w/o reactions  . Oxycodone-Acetaminophen Nausea Only and Other (See Comments)    Headaches    Family History  Problem Relation Age of Onset  . Coronary artery disease Father        CABG at 75  . Prostate  cancer Father   . Colon polyps Father   . Coronary artery disease Brother   . Colon polyps Brother   . Breast cancer Neg Hx   . Colon cancer Neg Hx   . Pancreatic cancer Neg Hx   . Stomach cancer Neg Hx   . Thyroid disease Neg Hx     BP 104/62   Pulse 62   Ht 5\' 5"  (1.651 m)   Wt 153 lb (69.4 kg)   BMI 25.46 kg/m    Review of Systems denies fever, headache, diplopia, palpitations, sob, diarrhea, polyuria, muscle weakness, excessive diaphoresis, tremor, anxiety, heat intolerance, and rhinorrhea. She has easy bruising.     Objective:   Physical Exam VS: see vs page GEN: no distress HEAD: head: no deformity eyes: no periorbital swelling, no proptosis external nose and ears are normal mouth: no lesion seen NECK: e thyroid nodules are palpable--1 left and 2 right.   Each approx 2 cm.  CHEST WALL: no deformity LUNGS: clear to auscultation CV: reg rate and rhythm; soft systolicmurmur ABD: abdomen is soft, nontender.  no hepatosplenomegaly.  not distended.  no hernia MUSCULOSKELETAL: muscle bulk and strength are grossly normal.  no obvious joint swelling.  gait is normal and steady EXTEMITIES: no deformity.  1+ bilat leg edema PULSES: no carotid bruit NEURO:  cn 2-12 grossly intact.   readily moves all 4's.  sensation is intact to touch on all 4's.  No tremor.   SKIN:  Normal texture and temperature.  No rash or suspicious lesion is visible.  Not diaphoretic NODES:  None palpable at the neck PSYCH: alert, well-oriented.  Does not appear anxious nor depressed.    Lab Results  Component Value Date   TSH <0.01 Repeated and verified X2. (L) 01/22/2017   T4TOTAL 6.4 04/17/2008   [place]: predominantly solid nodule measuring 2.6 x 1.6 x 1.4 cm is noted in midpole of right thyroid lobe which appears to be slightly enlarged compared to prior exam.  Thyroid bx (2007): RIGHT THYROID BENIGN COLLOID NODULE.  LEFT THYROID (THIN PREP, SMEARS): HYPERPLASTIC NODULE Thyroid bx (2011): 2 bxs. 1  is right lobe nodule.  The other is not specified.  Both dxs are HYPERPLASTIC NODULE.  I personally reviewed electrocardiogram tracing (12/16/13): Indication: edema Impression: SB.  No MI.  No hypertrophy. Compared to 2013: SB is new.       Assessment &  Plan:  Murmur, new, uncertain etiology.  she declines echo, at least for now. Multinodular goiter, usually hereditary.  she declines RAI. Hyperthyroidism, due to the goiter.    Patient Instructions  The heart murmur may be due to the thyroid.  We can recheck this when the thyroid is better, or you could request for Dr Larose Kells to check sooner.   I have sent a prescription to your pharmacy, to slow the thyroid. If ever you have fever while taking methimazole, stop it and call us, even if the reason is obvious, because of the risk of a rare side-effect. Please come back for a follow-up appointment in 2-3 weeks.

## 2017-01-25 NOTE — Telephone Encounter (Signed)
Relation to UX:YBFX Call back [place]:  Reason for call:  Patient requesting a refill LORazepam (ATIVAN) 0.5 MG tablet.   Patient would like to speak with nurse, patient didn't want to elaborate stating the message is to long, please advise

## 2017-01-25 NOTE — Patient Instructions (Addendum)
The heart murmur may be due to the thyroid.  We can recheck this when the thyroid is better, or you could request for Dr Larose Kells to check sooner.   I have sent a prescription to your pharmacy, to slow the thyroid. If ever you have fever while taking methimazole, stop it and call us, even if the reason is obvious, because of the risk of a rare side-effect. Please come back for a follow-up appointment in 2-3 weeks.

## 2017-01-27 DIAGNOSIS — E0590 Thyrotoxicosis, unspecified without thyrotoxic crisis or storm: Secondary | ICD-10-CM | POA: Insufficient documentation

## 2017-01-29 ENCOUNTER — Telehealth: Payer: Self-pay | Admitting: Internal Medicine

## 2017-01-29 DIAGNOSIS — E041 Nontoxic single thyroid nodule: Secondary | ICD-10-CM

## 2017-01-29 DIAGNOSIS — E0590 Thyrotoxicosis, unspecified without thyrotoxic crisis or storm: Secondary | ICD-10-CM

## 2017-01-29 NOTE — Telephone Encounter (Signed)
LMOM, asked for a call back 

## 2017-01-29 NOTE — Telephone Encounter (Signed)
Endo referral placed to Dr. Buddy Duty.

## 2017-01-29 NOTE — Telephone Encounter (Signed)
Caller name: Relationship to patient: Self Can be reached: [phone]  Pharmacy:  Reason for call: Patient request call back from provider to discuss referral that she was given. States she did not like the doctor and does not agree with the medication he prescribed.

## 2017-01-29 NOTE — Telephone Encounter (Signed)
Pt called  you back.

## 2017-01-29 NOTE — Telephone Encounter (Signed)
Pt saw Dr. Loanne Drilling regarding thyroid nodule and hyperthyroidism.

## 2017-01-29 NOTE — Telephone Encounter (Signed)
Patient liked more information about her treatment options (methimazole, surgery, RAI) at the end we agreed that she will benefit from a second opinion. Refer patient to Dr. Buddy Duty endocrinology

## 2017-01-30 ENCOUNTER — Ambulatory Visit (HOSPITAL_BASED_OUTPATIENT_CLINIC_OR_DEPARTMENT_OTHER)
Admission: RE | Admit: 2017-01-30 | Discharge: 2017-01-30 | Disposition: A | Discharge status: Home / Self Care | Payer: Medicare Other | Source: Ambulatory Visit | Attending: Internal Medicine | Admitting: Internal Medicine

## 2017-01-30 DIAGNOSIS — R938 Abnormal findings on diagnostic imaging of other specified body structures: Secondary | ICD-10-CM | POA: Insufficient documentation

## 2017-01-30 DIAGNOSIS — R9389 Abnormal findings on diagnostic imaging of other specified body structures: Secondary | ICD-10-CM

## 2017-01-30 DIAGNOSIS — I700 Atherosclerosis of aorta: Secondary | ICD-10-CM | POA: Diagnosis not present

## 2017-01-30 DIAGNOSIS — I2510 Atherosclerotic heart disease of native coronary artery without angina pectoris: Secondary | ICD-10-CM | POA: Diagnosis not present

## 2017-01-30 DIAGNOSIS — J984 Other disorders of lung: Secondary | ICD-10-CM | POA: Insufficient documentation

## 2017-01-31 ENCOUNTER — Telehealth: Payer: Self-pay | Admitting: Internal Medicine

## 2017-01-31 ENCOUNTER — Other Ambulatory Visit: Payer: Self-pay | Admitting: Internal Medicine

## 2017-01-31 DIAGNOSIS — Z1231 Encounter for screening mammogram for malignant neoplasm of breast: Secondary | ICD-10-CM

## 2017-01-31 NOTE — Telephone Encounter (Signed)
Pt will be called w/ results once PCP has had time to review them.

## 2017-01-31 NOTE — Telephone Encounter (Signed)
Caller name:Lotta Nam Relationship to patient: Can be reached:[phone] or [phone] Pharmacy:  Reason for call:Requesting CT results

## 2017-02-01 NOTE — Telephone Encounter (Signed)
Please release results to mychart.

## 2017-02-01 NOTE — Telephone Encounter (Signed)
Results released to My Chart.  

## 2017-02-15 ENCOUNTER — Ambulatory Visit: Payer: Medicare Other | Admitting: Endocrinology

## 2017-02-19 ENCOUNTER — Ambulatory Visit: Payer: Medicare Other | Admitting: Internal Medicine

## 2017-02-27 ENCOUNTER — Ambulatory Visit
Admission: RE | Admit: 2017-02-27 | Discharge: 2017-02-27 | Disposition: A | Discharge status: Home / Self Care | Payer: Medicare Other | Source: Ambulatory Visit | Attending: Internal Medicine | Admitting: Internal Medicine

## 2017-02-27 DIAGNOSIS — Z1231 Encounter for screening mammogram for malignant neoplasm of breast: Secondary | ICD-10-CM | POA: Diagnosis not present

## 2017-03-02 DIAGNOSIS — R634 Abnormal weight loss: Secondary | ICD-10-CM | POA: Diagnosis not present

## 2017-03-02 DIAGNOSIS — E042 Nontoxic multinodular goiter: Secondary | ICD-10-CM | POA: Diagnosis not present

## 2017-03-02 DIAGNOSIS — R490 Dysphonia: Secondary | ICD-10-CM | POA: Diagnosis not present

## 2017-03-02 DIAGNOSIS — E0590 Thyrotoxicosis, unspecified without thyrotoxic crisis or storm: Secondary | ICD-10-CM | POA: Diagnosis not present

## 2017-03-05 ENCOUNTER — Ambulatory Visit: Payer: Medicare Other | Admitting: Internal Medicine

## 2017-03-09 DIAGNOSIS — M961 Postlaminectomy syndrome, not elsewhere classified: Secondary | ICD-10-CM | POA: Diagnosis not present

## 2017-03-12 ENCOUNTER — Ambulatory Visit: Payer: Medicare Other | Admitting: Internal Medicine

## 2017-03-13 DIAGNOSIS — Z5181 Encounter for therapeutic drug level monitoring: Secondary | ICD-10-CM | POA: Diagnosis not present

## 2017-03-13 DIAGNOSIS — E042 Nontoxic multinodular goiter: Secondary | ICD-10-CM | POA: Diagnosis not present

## 2017-03-13 DIAGNOSIS — E0590 Thyrotoxicosis, unspecified without thyrotoxic crisis or storm: Secondary | ICD-10-CM | POA: Diagnosis not present

## 2017-03-21 DIAGNOSIS — H25813 Combined forms of age-related cataract, bilateral: Secondary | ICD-10-CM | POA: Diagnosis not present

## 2017-03-21 DIAGNOSIS — H524 Presbyopia: Secondary | ICD-10-CM | POA: Diagnosis not present

## 2017-03-30 DIAGNOSIS — E0590 Thyrotoxicosis, unspecified without thyrotoxic crisis or storm: Secondary | ICD-10-CM | POA: Diagnosis not present

## 2017-04-02 DIAGNOSIS — R208 Other disturbances of skin sensation: Secondary | ICD-10-CM | POA: Diagnosis not present

## 2017-04-02 DIAGNOSIS — M79604 Pain in right leg: Secondary | ICD-10-CM | POA: Diagnosis not present

## 2017-04-02 DIAGNOSIS — M79605 Pain in left leg: Secondary | ICD-10-CM | POA: Diagnosis not present

## 2017-04-02 DIAGNOSIS — R2681 Unsteadiness on feet: Secondary | ICD-10-CM | POA: Diagnosis not present

## 2017-04-02 DIAGNOSIS — M961 Postlaminectomy syndrome, not elsewhere classified: Secondary | ICD-10-CM | POA: Diagnosis not present

## 2017-04-18 ENCOUNTER — Emergency Department (HOSPITAL_COMMUNITY): Payer: Medicare Other

## 2017-04-18 ENCOUNTER — Encounter (HOSPITAL_COMMUNITY): Payer: Self-pay | Admitting: Emergency Medicine

## 2017-04-18 ENCOUNTER — Emergency Department (HOSPITAL_COMMUNITY)
Admission: EM | Admit: 2017-04-18 | Discharge: 2017-04-18 | Disposition: A | Discharge status: Home / Self Care | Payer: Medicare Other | Attending: Emergency Medicine | Admitting: Emergency Medicine

## 2017-04-18 DIAGNOSIS — S62111A Displaced fracture of triquetrum [cuneiform] bone, right wrist, initial encounter for closed fracture: Secondary | ICD-10-CM | POA: Diagnosis not present

## 2017-04-18 DIAGNOSIS — S46812A Strain of other muscles, fascia and tendons at shoulder and upper arm level, left arm, initial encounter: Secondary | ICD-10-CM | POA: Diagnosis not present

## 2017-04-18 DIAGNOSIS — X500XXA Overexertion from strenuous movement or load, initial encounter: Secondary | ICD-10-CM | POA: Insufficient documentation

## 2017-04-18 DIAGNOSIS — S6991XA Unspecified injury of right wrist, hand and finger(s), initial encounter: Secondary | ICD-10-CM | POA: Diagnosis present

## 2017-04-18 DIAGNOSIS — Y9389 Activity, other specified: Secondary | ICD-10-CM | POA: Diagnosis not present

## 2017-04-18 DIAGNOSIS — M1811 Unilateral primary osteoarthritis of first carpometacarpal joint, right hand: Secondary | ICD-10-CM | POA: Diagnosis not present

## 2017-04-18 DIAGNOSIS — Y929 Unspecified place or not applicable: Secondary | ICD-10-CM | POA: Diagnosis not present

## 2017-04-18 DIAGNOSIS — Z96612 Presence of left artificial shoulder joint: Secondary | ICD-10-CM | POA: Diagnosis not present

## 2017-04-18 DIAGNOSIS — Y999 Unspecified external cause status: Secondary | ICD-10-CM | POA: Insufficient documentation

## 2017-04-18 NOTE — ED Notes (Signed)
Discharge instructions reviewed with patient. Patient verbalizes understanding. VSS.   

## 2017-04-18 NOTE — ED Notes (Signed)
Pt's female visitor upset the Pt is having to wait.  Asking for a copy of the xray, so she can follow-up with Ortho MD.  Visitor informed discs can only be provided once the Pt is seen by an EDP.  Verbalized understanding and reports they will wait a little longer.

## 2017-04-18 NOTE — ED Triage Notes (Signed)
Patient reports right wrist pain since yesterday. Patient denies falling or injuries but did lift a 50lb bag of bird seed yesterday.  Patient can move fingers but unable to make a fist due to pain.

## 2017-04-18 NOTE — ED Notes (Signed)
Bed: WTR7 Expected date:  Expected time:  Means of arrival:  Comments: 

## 2017-04-18 NOTE — ED Provider Notes (Signed)
[facility] DEPT Provider Note   CSN: [id number] Arrival date & time: 04/18/17  0825     History   Chief Complaint Chief Complaint  Patient presents with  . Wrist Pain    HPI BETSI CRESPI is a 76 y.o. female.  The history is provided by the patient.  Wrist Pain  This is a new problem. The current episode started yesterday. The problem occurs constantly. The problem has not changed since onset.Pertinent negatives include no chest pain, no abdominal pain and no shortness of breath. Exacerbated by: movement or range of motion of the wrist. Nothing relieves the symptoms. She has tried a cold compress for the symptoms. The treatment provided mild relief.   76 year old female who presents with right wrist pain. She was lifting a 50 lb bird feed bag yesterday with pain the right wrist since. No fall or trauma. No numbness, but feels weak in the right hand due to pain. No fever or chills.   Past Medical History:  Diagnosis Date  . Adenomatous colon polyp 03/2003  . Anxiety    ativan for sleep  . Baker's cyst   . Degenerative lumbar disc    Dr. Nelva Bush, epidural injections  . Goiter   . History of elevated homocysteine   . Hypertension 09/11/2005  . Lumbar radiculopathy    Dr. Nelva Bush, epidureal injections  . Lumbosacral radiculopathy    seeing Pain Management  . OA (osteoarthritis)     knee pain after TKR left , back pain- last steroid injection back 2 months ago Dr Nelva Bush  . PONV (postoperative nausea and vomiting)   . Postlaminectomy syndrome, lumbar region    Dr. Nelva Bush, recommended doing bilateral S1 transforaminal epidural with D5W  . Thyroid nodule    rt benign colloid nodule--left hyperplastic nodule- Thyroid bx 2006: neg    Patient Active Problem List   Diagnosis Date Noted  . Hyperthyroidism 01/27/2017  . Chronic pain syndrome 10/24/2016  . High cholesterol 10/24/2016  . Lumbar post-laminectomy syndrome 10/04/2015  . Chronic lumbar radiculopathy 10/04/2015  . PCP  NOTES >>>>> 08/07/2015  . Allergic rhinitis 01/21/2015  . Leg edema 12/16/2013  . Weight loss 08/29/2012  . Lumbar canal stenosis 08/20/2012  . Annual physical exam 11/03/2011  . Heart murmur 11/03/2011  . Dermatitis 01/19/2011  . THYROID NODULE 03/15/2010  . PERSONAL HX COLONIC POLYPS 04/30/2008  . FATIGUE 04/17/2008  . Anxiety state 01/27/2008  . Osteoarthritis 01/27/2008  . BAKER'S CYST 12/20/2006  . Essential hypertension 09/11/2005    Past Surgical History:  Procedure Laterality Date  . ABDOMINAL HYSTERECTOMY    . BACK SURGERY  11-09-2011   at [facility], lumbar  . bakers cystectomy     right knee  . FACELIFT    . INCISIONAL HERNIA REPAIR    . KNEE ARTHROSCOPY     L   replacement 2007, Dr Corinne Ports, then a manipulation, then redo TKR 11/09 w/ Dr.Alusio. Parcial replacement again 09-2010  . KNEE SURGERY     x 5-  left  . OOPHORECTOMY    . SHOULDER SURGERY     replacements: L 2010, R 2012  . TOTAL HIP ARTHROPLASTY  3/11   Right   . TOTAL KNEE ARTHROPLASTY  05/20/2012   Procedure: TOTAL KNEE ARTHROPLASTY;  Surgeon: Gearlean Alf, MD;  Location: [facility];  Service: Orthopedics;  Laterality: Right;    OB History    No data available       Home Medications    Prior  to Admission medications   Medication Sig Start Date End Date Taking? Authorizing Provider  aspirin 81 MG tablet Take 81 mg by mouth daily.    [provider]  Calcium Carbonate-Vitamin D (CALCIUM + D PO) Take 600 mg by mouth daily after breakfast.    [provider]  folic acid (FOLVITE) 1 MG tablet Take 2 tablets (2 mg total) by mouth daily. 02/24/16   Colon Branch, MD  gabapentin (NEURONTIN) 800 MG tablet TAKE 1 TABLET(800 MG) BY MOUTH FOUR TIMES DAILY 07/24/16   Kirsteins, Luanna Salk, MD  HYDROcodone-acetaminophen (NORCO/VICODIN) 5-325 MG tablet Take 1 tablet by mouth 2 (two) times daily as needed. 01/05/16   [provider]  hydrocortisone 2.5 % cream  05/05/14   [provider]    lisinopril-hydrochlorothiazide (PRINZIDE,ZESTORETIC) 20-12.5 MG tablet Take 1 tablet by mouth daily. 11/28/16   Colon Branch, MD  LORazepam (ATIVAN) 0.5 MG tablet Take 1-2 tablets (0.5-1 mg total) by mouth at bedtime as needed for sleep. 10/24/16   Colon Branch, MD  methimazole (TAPAZOLE) 10 MG tablet TAKE 1 TABLET(10 MG) BY MOUTH TWICE DAILY 01/25/17   Renato Shin, MD  tretinoin (RETIN-A) 0.1 % cream  06/11/15   [provider]    Family History Family History  Problem Relation Age of Onset  . Coronary artery disease Father        CABG at 61  . Prostate cancer Father   . Colon polyps Father   . Coronary artery disease Brother   . Colon polyps Brother   . Breast cancer Neg Hx   . Colon cancer Neg Hx   . Pancreatic cancer Neg Hx   . Stomach cancer Neg Hx   . Thyroid disease Neg Hx     Social History Social History  Substance Use Topics  . Smoking status: Former Smoker    Quit date: 09/12/1983  . Smokeless tobacco: Never Used  . Alcohol use 0.0 oz/week     Comment: occasional      Allergies   Tramadol and Oxycodone-acetaminophen   Review of Systems Review of Systems  Constitutional: Negative for fever.  Respiratory: Negative for shortness of breath.   Cardiovascular: Negative for chest pain.  Gastrointestinal: Negative for abdominal pain.  Musculoskeletal: Positive for arthralgias (right wrist pain).  Skin: Negative for wound.  Allergic/Immunologic: Negative for immunocompromised state.  Hematological: Does not bruise/bleed easily.  All other systems reviewed and are negative.    Physical Exam Updated Vital Signs BP (!) 144/77 (BP Location: Right Arm)   Pulse 78   Temp (!) 97.5 F (36.4 C) (Oral)   Resp 18   SpO2 100%   Physical Exam Physical Exam  Constitutional: Appears well-developed and well-nourished. No acute distress. HENT:  Head: Normocephalic.  Eyes: Conjunctivae are normal.  Neck: normal range of motion, supple Cardiovascular: Normal  rate and intact distal pulses.  +2 radial pulses Pulmonary/Chest: Effort normal. No respiratory distress.  Abdominal: Exhibits no distension.  Musculoskeletal: Limited ROM of the right wrist due to pain. Mild soft tissue swelling at the medial aspect of the left hand.  Neurological: Alert. Fluent speech. In tact innervation of radial, ulnar, median nerve of the right hand Skin: Skin is warm and dry.  Psychiatric: Normal mood and affect. Behavior is normal.  Nursing note and vitals reviewed.   ED Treatments / Results  Labs (all labs ordered are listed, but only abnormal results are displayed) Labs Reviewed - No data to display  EKG  EKG Interpretation None       Radiology Dg Wrist Complete Right  Result Date: 04/18/2017 CLINICAL DATA:  Swelling and wrist pain.  No injury. EXAM: RIGHT WRIST - COMPLETE 3+ VIEW COMPARISON:  None. FINDINGS: There is a small ossific density just dorsal to the carpal bones on the lateral view, with overlying soft tissues swelling. Severe degenerative changes of the first [facility] joint. IMPRESSION: 1. Small ossific density just dorsal to the carpal bones on the lateral view may represent a triquetral fracture. Correlate with point tenderness. 2. Severe first [facility] degenerative changes. Electronically Signed   By: Titus Dubin M.D.   On: 04/18/2017 09:36    Procedures Procedures (including critical care time) SPLINT APPLICATION Date/Time: 58:09 AM Authorized by: Forde Dandy Consent: Verbal consent obtained. Risks and benefits: risks, benefits and alternatives were discussed Consent given by: patient Splint applied by: orthopedic technician Location details: right wrist Splint type: ulnar gutter Supplies used: orthoglass Post-procedure: The splinted body part was neurovascularly unchanged following the procedure. Patient tolerance: Patient tolerated the procedure well with no immediate complications.    Medications Ordered in ED Medications - No data  to display   Initial Impression / Assessment and Plan / ED Course  I have reviewed the triage vital signs and the nursing notes.  Pertinent labs & imaging results that were available during my care of the patient were reviewed by me and considered in my medical decision making (see chart for details).     Presents with right wrist pain after heavy lifting. Hand is neurovascularly in tact. X-rays of wrist visualized and she does have possible chip fracture of the triquetrum bone. This is where there is soft tissue swelling and tenderness. Will place in ulnar gutter splint. She follows with [facility], and discussed needing follow-up with hand surgeon there. Strict return and follow-up instructions reviewed. She expressed understanding of all discharge instructions and felt comfortable with the plan of care.   Final Clinical Impressions(s) / ED Diagnoses   Final diagnoses:  Closed chip fracture of triquetrum of right wrist, initial encounter    New Prescriptions New Prescriptions   No medications on file     Forde Dandy, MD 04/18/17 1046

## 2017-04-18 NOTE — Discharge Instructions (Signed)
Your have a triquetral chip fracture of the right wrist. Please follow-up with a hand surgeon from [place] [place] for follow-up. Please return for worsening symptoms, including escalating pain, or any other symptoms concerning to you. You can take your home hydrocodone as needed for pain.

## 2017-04-23 DIAGNOSIS — M65321 Trigger finger, right index finger: Secondary | ICD-10-CM | POA: Diagnosis not present

## 2017-04-23 DIAGNOSIS — M25531 Pain in right wrist: Secondary | ICD-10-CM | POA: Diagnosis not present

## 2017-04-23 DIAGNOSIS — M25532 Pain in left wrist: Secondary | ICD-10-CM | POA: Diagnosis not present

## 2017-04-24 DIAGNOSIS — E0590 Thyrotoxicosis, unspecified without thyrotoxic crisis or storm: Secondary | ICD-10-CM | POA: Diagnosis not present

## 2017-04-26 DIAGNOSIS — H25811 Combined forms of age-related cataract, right eye: Secondary | ICD-10-CM | POA: Diagnosis not present

## 2017-04-26 DIAGNOSIS — H2511 Age-related nuclear cataract, right eye: Secondary | ICD-10-CM | POA: Diagnosis not present

## 2017-05-03 DIAGNOSIS — G5732 Lesion of lateral popliteal nerve, left lower limb: Secondary | ICD-10-CM | POA: Diagnosis not present

## 2017-05-03 DIAGNOSIS — Z87891 Personal history of nicotine dependence: Secondary | ICD-10-CM | POA: Diagnosis not present

## 2017-05-03 DIAGNOSIS — Z79899 Other long term (current) drug therapy: Secondary | ICD-10-CM | POA: Diagnosis not present

## 2017-05-03 DIAGNOSIS — R208 Other disturbances of skin sensation: Secondary | ICD-10-CM | POA: Diagnosis not present

## 2017-05-03 DIAGNOSIS — M79604 Pain in right leg: Secondary | ICD-10-CM | POA: Diagnosis not present

## 2017-05-03 DIAGNOSIS — M961 Postlaminectomy syndrome, not elsewhere classified: Secondary | ICD-10-CM | POA: Diagnosis not present

## 2017-05-03 DIAGNOSIS — R531 Weakness: Secondary | ICD-10-CM | POA: Diagnosis not present

## 2017-05-03 DIAGNOSIS — M79605 Pain in left leg: Secondary | ICD-10-CM | POA: Diagnosis not present

## 2017-05-11 DIAGNOSIS — E0590 Thyrotoxicosis, unspecified without thyrotoxic crisis or storm: Secondary | ICD-10-CM | POA: Diagnosis not present

## 2017-05-11 DIAGNOSIS — Z5181 Encounter for therapeutic drug level monitoring: Secondary | ICD-10-CM | POA: Diagnosis not present

## 2017-05-11 DIAGNOSIS — E0500 Thyrotoxicosis with diffuse goiter without thyrotoxic crisis or storm: Secondary | ICD-10-CM | POA: Diagnosis not present

## 2017-05-11 DIAGNOSIS — E042 Nontoxic multinodular goiter: Secondary | ICD-10-CM | POA: Diagnosis not present

## 2017-05-11 LAB — TSH: TSH: 8.92 — AB (ref 0.41–5.90)

## 2017-05-16 DIAGNOSIS — M25531 Pain in right wrist: Secondary | ICD-10-CM | POA: Diagnosis not present

## 2017-05-16 DIAGNOSIS — M65321 Trigger finger, right index finger: Secondary | ICD-10-CM | POA: Diagnosis not present

## 2017-05-16 DIAGNOSIS — M180 Bilateral primary osteoarthritis of first carpometacarpal joints: Secondary | ICD-10-CM | POA: Diagnosis not present

## 2017-05-16 DIAGNOSIS — M25532 Pain in left wrist: Secondary | ICD-10-CM | POA: Diagnosis not present

## 2017-05-19 DIAGNOSIS — Z23 Encounter for immunization: Secondary | ICD-10-CM | POA: Diagnosis not present

## 2017-05-21 ENCOUNTER — Telehealth: Payer: Self-pay | Admitting: Internal Medicine

## 2017-05-21 NOTE — Telephone Encounter (Signed)
Saw thyroid specialist who wanted her to take six pills at a time. For five months she has been taking meds. She is sweaty and has lost from 170 lbs to 143 lbs. Please advise. Pt not impressed with thyroid dr either. Call pt [phone] and cell [phone].

## 2017-05-21 NOTE — Telephone Encounter (Addendum)
Advise patient, in the setting of hyperthyroidism, weight loss is likely related to it. Is imperative she sees the endocrinologist ASAP, I'm not sure if she sees Dr. Loanne Drilling or Dr. Buddy Duty but she needs to be seen by one of them. If patient so desire, come to the office and see me but eventually treatment will be directed by the specialist.

## 2017-05-21 NOTE — Telephone Encounter (Signed)
Please advise 

## 2017-05-22 NOTE — Telephone Encounter (Addendum)
LMOM informing Pt of PCP recommendations below. Instructed to call if questions/concerns.

## 2017-05-24 ENCOUNTER — Encounter: Payer: Self-pay | Admitting: Internal Medicine

## 2017-05-26 ENCOUNTER — Other Ambulatory Visit: Payer: Self-pay | Admitting: Internal Medicine

## 2017-05-28 NOTE — Telephone Encounter (Signed)
Okay 45 and 2 RF

## 2017-05-28 NOTE — Telephone Encounter (Signed)
Pt is requesting refill on lorazepam 0.5mg   Last OV: 01/22/2017 Last Fill: 10/24/2016 #45 and 2RF UDS: 10/24/2016 Low risk  Please advise.

## 2017-05-28 NOTE — Telephone Encounter (Signed)
Rx faxed to [facility] pharmacy.  

## 2017-05-28 NOTE — Telephone Encounter (Signed)
Rx printed, awaiting MD signature.  

## 2017-06-01 DIAGNOSIS — E0590 Thyrotoxicosis, unspecified without thyrotoxic crisis or storm: Secondary | ICD-10-CM | POA: Diagnosis not present

## 2017-06-04 DIAGNOSIS — S46912D Strain of unspecified muscle, fascia and tendon at shoulder and upper arm level, left arm, subsequent encounter: Secondary | ICD-10-CM | POA: Diagnosis not present

## 2017-06-04 DIAGNOSIS — Z96612 Presence of left artificial shoulder joint: Secondary | ICD-10-CM | POA: Diagnosis not present

## 2017-06-07 DIAGNOSIS — H25812 Combined forms of age-related cataract, left eye: Secondary | ICD-10-CM | POA: Diagnosis not present

## 2017-06-07 DIAGNOSIS — H2512 Age-related nuclear cataract, left eye: Secondary | ICD-10-CM | POA: Diagnosis not present

## 2017-06-18 ENCOUNTER — Ambulatory Visit (INDEPENDENT_AMBULATORY_CARE_PROVIDER_SITE_OTHER): Payer: Medicare Other | Admitting: Medical

## 2017-06-18 ENCOUNTER — Encounter: Payer: Self-pay | Admitting: Medical

## 2017-06-18 VITALS — BP 127/78 | HR 76 | Temp 98.1°F | Resp 16 | Ht 65.0 in | Wt 144.2 lb

## 2017-06-18 DIAGNOSIS — R5383 Other fatigue: Secondary | ICD-10-CM | POA: Diagnosis not present

## 2017-06-18 DIAGNOSIS — R6883 Chills (without fever): Secondary | ICD-10-CM | POA: Diagnosis not present

## 2017-06-18 DIAGNOSIS — E538 Deficiency of other specified B group vitamins: Secondary | ICD-10-CM

## 2017-06-18 DIAGNOSIS — E041 Nontoxic single thyroid nodule: Secondary | ICD-10-CM

## 2017-06-18 DIAGNOSIS — R739 Hyperglycemia, unspecified: Secondary | ICD-10-CM

## 2017-06-18 DIAGNOSIS — E0590 Thyrotoxicosis, unspecified without thyrotoxic crisis or storm: Secondary | ICD-10-CM

## 2017-06-18 DIAGNOSIS — Z23 Encounter for immunization: Secondary | ICD-10-CM | POA: Diagnosis not present

## 2017-06-18 NOTE — Progress Notes (Signed)
Subjective:    Patient ID: [name], female    DOB: 15-Jun-1941, 76 y.o.   MRN: [id number]  HPI  Pt in for evaluation. She feels having cold sweats and loosing some weight. She is also sweating some at night. Earlier today she was randomly sweating. Before she came to office had to change close due to sweating. Also has been feeling fatigue. She associated her symptoms to onset of treatment for hypertyroidism.  Pt went to Dr. Loanne Drilling first but pt want to change and then went to Dr. Buddy Duty.   Pt will get some labs on Oct 25 th with specialist and then will see specialist on 31st.   Pt has had some dose reductions on methimazole after initial testing.  Pt states she has some fatigue. Pt denies any fevers. Denies any cough.  On review appears pt had hyperthyoridism     Review of Systems  Constitutional: Positive for chills, diaphoresis, fatigue and unexpected weight change.  HENT: Negative for congestion, ear pain, facial swelling, nosebleeds, postnasal drip, rhinorrhea, sinus pain, sinus pressure, sneezing, sore throat and trouble swallowing.        Hoarse voice since starting thyroid med.  Respiratory: Negative for cough, chest tightness and wheezing.   Cardiovascular: Negative for chest pain and palpitations.  Gastrointestinal: Negative for abdominal pain, constipation, nausea and vomiting.  Musculoskeletal: Negative for back pain, joint swelling and neck stiffness.  Skin: Negative for rash.  Neurological: Negative for dizziness and headaches.  Hematological: Negative for adenopathy. Does not bruise/bleed easily.  Psychiatric/Behavioral: Positive for sleep disturbance. Negative for behavioral problems and confusion. The patient is not nervous/anxious.        Insomnia.  Some disturbed since dx of hyperthyroid.    Past Medical History:  Diagnosis Date  . Adenomatous colon polyp 03/2003  . Anxiety    ativan for sleep  . Baker's cyst   . Degenerative lumbar disc    Dr.  Nelva Bush, epidural injections  . Goiter   . History of elevated homocysteine   . Hypertension 09/11/2005  . Lumbar radiculopathy    Dr. Nelva Bush, epidureal injections  . Lumbosacral radiculopathy    seeing Pain Management  . OA (osteoarthritis)     knee pain after TKR left , back pain- last steroid injection back 2 months ago Dr Nelva Bush  . PONV (postoperative nausea and vomiting)   . Postlaminectomy syndrome, lumbar region    Dr. Nelva Bush, recommended doing bilateral S1 transforaminal epidural with D5W  . Thyroid nodule    rt benign colloid nodule--left hyperplastic nodule- Thyroid bx 2006: neg     Social History   Social History  . Marital status: Married    Spouse name: N/A  . Number of children: 2  . Years of education: N/A   Occupational History  . Retired Pharmacist, hospital     retired  .  Retired   Social History Main Topics  . Smoking status: Former Smoker    Quit date: 09/12/1983  . Smokeless tobacco: Never Used  . Alcohol use 0.0 oz/week     Comment: occasional   . Drug use: No  . Sexual activity: Not on file   Other Topics Concern  . Not on file   Social History Narrative   Lives w/ husband of 47 years in a 3 story home. Has 2 sons.  Retired Education officer, museum.  Education: college.             Past Surgical History:  Procedure Laterality Date  .  ABDOMINAL HYSTERECTOMY    . BACK SURGERY  11-09-2011   at [facility], lumbar  . bakers cystectomy     right knee  . FACELIFT    . INCISIONAL HERNIA REPAIR    . KNEE ARTHROSCOPY     L   replacement 2007, Dr Corinne Ports, then a manipulation, then redo TKR 11/09 w/ Dr.Alusio. Parcial replacement again 09-2010  . KNEE SURGERY     x 5-  left  . OOPHORECTOMY    . SHOULDER SURGERY     replacements: L 2010, R 2012  . TOTAL HIP ARTHROPLASTY  3/11   Right   . TOTAL KNEE ARTHROPLASTY  05/20/2012   Procedure: TOTAL KNEE ARTHROPLASTY;  Surgeon: Gearlean Alf, MD;  Location: [facility];  Service: Orthopedics;  Laterality: Right;    Family History    Problem Relation Age of Onset  . Coronary artery disease Father        CABG at 76  . Prostate cancer Father   . Colon polyps Father   . Coronary artery disease Brother   . Colon polyps Brother   . Breast cancer Neg Hx   . Colon cancer Neg Hx   . Pancreatic cancer Neg Hx   . Stomach cancer Neg Hx   . Thyroid disease Neg Hx     Allergies  Allergen Reactions  . Tramadol Hives and Swelling    Per Pt she can take w/o reactions  . Oxycodone-Acetaminophen Nausea Only and Other (See Comments)    Headaches    Current Outpatient Prescriptions on File Prior to Visit  Medication Sig Dispense Refill  . aspirin 81 MG tablet Take 81 mg by mouth daily.    . Calcium Carbonate-Vitamin D (CALCIUM + D PO) Take 600 mg by mouth daily after breakfast.    . folic acid (FOLVITE) 1 MG tablet Take 2 tablets (2 mg total) by mouth daily. 60 tablet 12  . gabapentin (NEURONTIN) 800 MG tablet TAKE 1 TABLET(800 MG) BY MOUTH FOUR TIMES DAILY 120 tablet 0  . HYDROcodone-acetaminophen (NORCO/VICODIN) 5-325 MG tablet Take 1 tablet by mouth 2 (two) times daily as needed.    . hydrocortisone 2.5 % cream     . lisinopril-hydrochlorothiazide (PRINZIDE,ZESTORETIC) 20-12.5 MG tablet Take 1 tablet by mouth daily. 90 tablet 2  . LORazepam (ATIVAN) 0.5 MG tablet Take 1-2 tablets (0.5-1 mg total) by mouth at bedtime as needed for sleep. 45 tablet 2  . methimazole (TAPAZOLE) 10 MG tablet TAKE 1 TABLET(10 MG) BY MOUTH TWICE DAILY (Patient taking differently: Take 3 tablets once daily as directed by endo) 180 tablet 1  . tretinoin (RETIN-A) 0.1 % cream   4   No current facility-administered medications on file prior to visit.     BP 127/78   Pulse 76   Temp 98.1 F (36.7 C) (Oral)   Resp 16   Ht 5\' 5"  (1.651 m)   Wt 144 lb 3.2 oz (65.4 kg)   SpO2 98%   BMI 24.00 kg/m       Objective:   Physical Exam  General Mental Status- Alert. General Appearance- Not in acute distress.   Skin General: Color- Normal  Color. Moisture- Normal Moisture.  Neck Carotid Arteries- Normal color. Moisture- Normal Moisture. No carotid bruits. No JVD. Fullness to thyroid lower portion.  Chest and Lung Exam Auscultation: Breath Sounds:-Normal.  Cardiovascular Auscultation:Rythm- Regular. Murmurs & Other Heart Sounds:Auscultation of the heart reveals- No Murmurs.  Abdomen Inspection:-Inspeection Normal. Palpation/Percussion:Note:No mass. Palpation and Percussion of  the abdomen reveal- Non Tender, Non Distended + BS, no rebound or guarding.    Neurologic Cranial Nerve exam:- CN III-XII intact(No nystagmus), symmetric smile. Strength:- 5/5 equal and symmetric strength both upper and lower extremities.     Assessment & Plan:  For your recent symptoms of fatigue, chills,  and hyperthyroidism and methimazole use, I am placing order for cbc, b12, b1, cbc, tsh, t4, and t3.   We will let you know results of labs when they are in and do have plans to send lab results to Dr. Buddy Duty. Will try to call office and pass message to Dr. Buddy Duty nurse.  Follow up date to be determined.  Havyn Ramo, Percell Miller, PA-C

## 2017-06-18 NOTE — Patient Instructions (Addendum)
For your recent symptoms of fatigue, chills,  and hyperthyroidism and  Methimazole use , I am placing order for cbc, b12, b1, cbc, tsh, t4, and t3.   We will let you know results of labs when they are in and do have plans to send lab results to Dr. Buddy Duty. Will try to call office and pass message to Dr. Buddy Duty nurse.  Follow up date to be determined.

## 2017-06-19 LAB — CBC WITH DIFFERENTIAL/PLATELET
Basophils Absolute: 0.1 10*3/uL (ref 0.0–0.1)
Basophils Relative: 1.1 % (ref 0.0–3.0)
Eosinophils Absolute: 0.1 10*3/uL (ref 0.0–0.7)
Eosinophils Relative: 1.1 % (ref 0.0–5.0)
HCT: 43.9 % (ref 36.0–46.0)
Hemoglobin: 14.2 g/dL (ref 12.0–15.0)
Lymphocytes Relative: 24.6 % (ref 12.0–46.0)
Lymphs Abs: 2.1 10*3/uL (ref 0.7–4.0)
MCHC: 32.3 g/dL (ref 30.0–36.0)
MCV: 90.8 fl (ref 78.0–100.0)
Monocytes Absolute: 0.6 10*3/uL (ref 0.1–1.0)
Monocytes Relative: 6.9 % (ref 3.0–12.0)
Neutro Abs: 5.7 10*3/uL (ref 1.4–7.7)
Neutrophils Relative %: 66.3 % (ref 43.0–77.0)
Platelets: 338 10*3/uL (ref 150.0–400.0)
RBC: 4.83 Mil/uL (ref 3.87–5.11)
RDW: 17.3 % — ABNORMAL HIGH (ref 11.5–15.5)
WBC: 8.6 10*3/uL (ref 4.0–10.5)

## 2017-06-19 LAB — VITAMIN B12: Vitamin B-12: 792 pg/mL (ref 211–911)

## 2017-06-19 LAB — TSH: TSH: 1.35 u[IU]/mL (ref 0.35–4.50)

## 2017-06-19 LAB — T3, FREE: T3, Free: 2.7 pg/mL (ref 2.3–4.2)

## 2017-06-19 LAB — T4, FREE: Free T4: 0.76 ng/dL (ref 0.60–1.60)

## 2017-06-19 LAB — HEMOGLOBIN A1C: Hgb A1c MFr Bld: 5.5 % (ref 4.6–6.5)

## 2017-06-20 ENCOUNTER — Telehealth: Payer: Self-pay

## 2017-06-20 NOTE — Telephone Encounter (Signed)
-----   Message from Mackie Pai, PA-C sent at 06/19/2017  3:58 PM EDT ----- All labs so far look very good. B12, tsh, t4 t3, cbc(no anemia), and a1c all look normal.(a1c is the 3 month sugar level average). Sugars very tightly controlled. Will you please send the tsh, t3 and t4 results to Dr. Buddy Duty her endocrinologist. When you call pt see if you can get number of office to Dr. Buddy Duty. B1 level still pending.

## 2017-06-20 NOTE — Telephone Encounter (Signed)
Called Pt two times, was not able to leave VM but I will try again. Dr.Kerr's number is [phone] per PA Saguier's inquiry.

## 2017-06-21 LAB — VITAMIN B1: Vitamin B1 (Thiamine): 11 nmol/L (ref 8–30)

## 2017-06-26 DIAGNOSIS — Z471 Aftercare following joint replacement surgery: Secondary | ICD-10-CM | POA: Diagnosis not present

## 2017-06-26 DIAGNOSIS — Z96641 Presence of right artificial hip joint: Secondary | ICD-10-CM | POA: Diagnosis not present

## 2017-06-26 DIAGNOSIS — M25551 Pain in right hip: Secondary | ICD-10-CM | POA: Diagnosis not present

## 2017-07-09 DIAGNOSIS — M5441 Lumbago with sciatica, right side: Secondary | ICD-10-CM | POA: Diagnosis not present

## 2017-07-11 DIAGNOSIS — Z5181 Encounter for therapeutic drug level monitoring: Secondary | ICD-10-CM | POA: Diagnosis not present

## 2017-07-11 DIAGNOSIS — E042 Nontoxic multinodular goiter: Secondary | ICD-10-CM | POA: Diagnosis not present

## 2017-07-11 DIAGNOSIS — E0500 Thyrotoxicosis with diffuse goiter without thyrotoxic crisis or storm: Secondary | ICD-10-CM | POA: Diagnosis not present

## 2017-07-11 DIAGNOSIS — E0590 Thyrotoxicosis, unspecified without thyrotoxic crisis or storm: Secondary | ICD-10-CM | POA: Diagnosis not present

## 2017-08-09 DIAGNOSIS — H02413 Mechanical ptosis of bilateral eyelids: Secondary | ICD-10-CM | POA: Diagnosis not present

## 2017-08-09 DIAGNOSIS — H02423 Myogenic ptosis of bilateral eyelids: Secondary | ICD-10-CM | POA: Diagnosis not present

## 2017-08-09 DIAGNOSIS — H02831 Dermatochalasis of right upper eyelid: Secondary | ICD-10-CM | POA: Diagnosis not present

## 2017-08-09 DIAGNOSIS — H02834 Dermatochalasis of left upper eyelid: Secondary | ICD-10-CM | POA: Diagnosis not present

## 2017-08-09 DIAGNOSIS — H0279 Other degenerative disorders of eyelid and periocular area: Secondary | ICD-10-CM | POA: Diagnosis not present

## 2017-08-26 ENCOUNTER — Other Ambulatory Visit: Payer: Self-pay | Admitting: Internal Medicine

## 2017-08-27 DIAGNOSIS — E0590 Thyrotoxicosis, unspecified without thyrotoxic crisis or storm: Secondary | ICD-10-CM | POA: Diagnosis not present

## 2017-08-27 LAB — TSH: TSH: 0.05 — AB (ref <=5.90)

## 2017-08-27 NOTE — Telephone Encounter (Signed)
Pt is requesting refill on lorazepam 0.5mg .  Last OV: 06/18/2017 w/ Edward Last Fill: 05/28/2017 #45 and 2RF UDS: 10/24/2016 Low risk  Please advise.

## 2017-08-27 NOTE — Telephone Encounter (Signed)
Twice patient, I sent a prescription however she is due for a   visit before next refill.

## 2017-09-20 IMAGING — US US THYROID BIOPSY
1 series · 13 of 14 positions shown · non-contrast
Comparison: Ultrasound on February 21, 2016

INDICATION: 75-year-old with history of right thyroid nodule. Repeat thyroid
ultrasound recently revealed an enlargement of this nodule.
Recommendation is made for FNA.

EXAM:
ULTRASOUND GUIDED NEEDLE ASPIRATE BIOPSY OF THE THYROID GLAND

[Series 1: us thyroid biopsy · 0.07mm/px · 14 acquisitions, 13 frames shown]
[im 1/14]
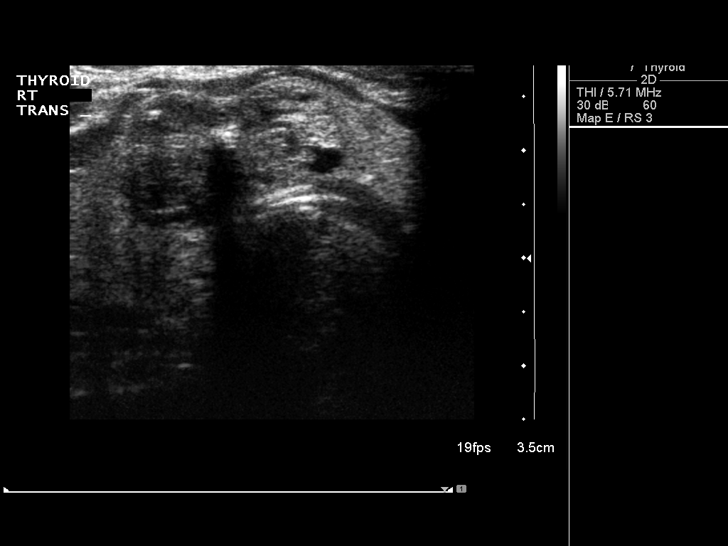
[im 2/14]
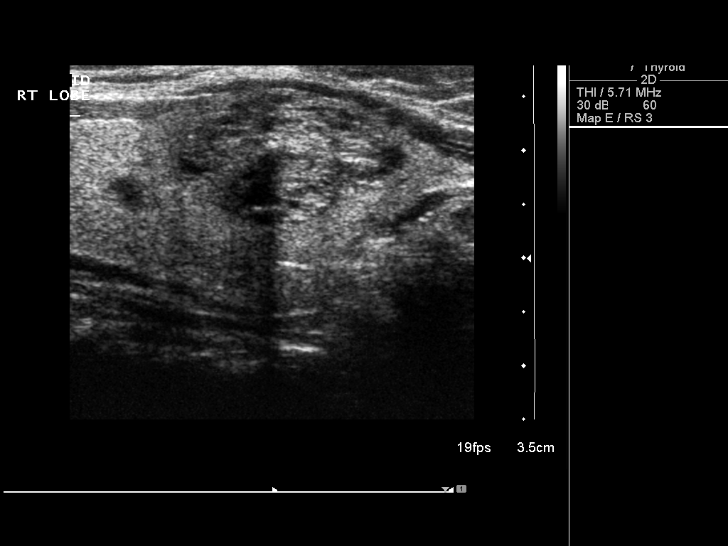
[im 3/14]
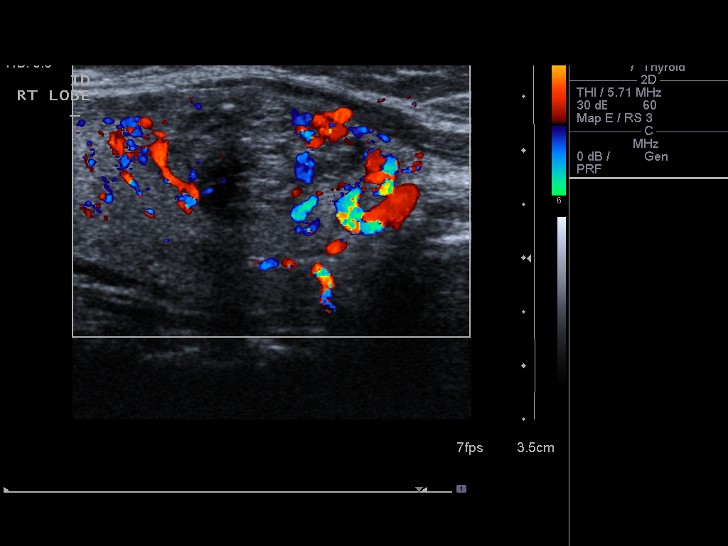
[im 4/14]
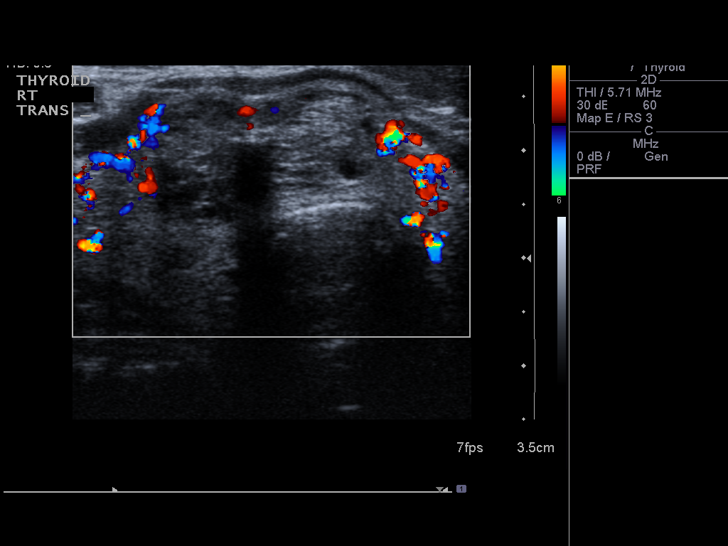
[im 5/14]
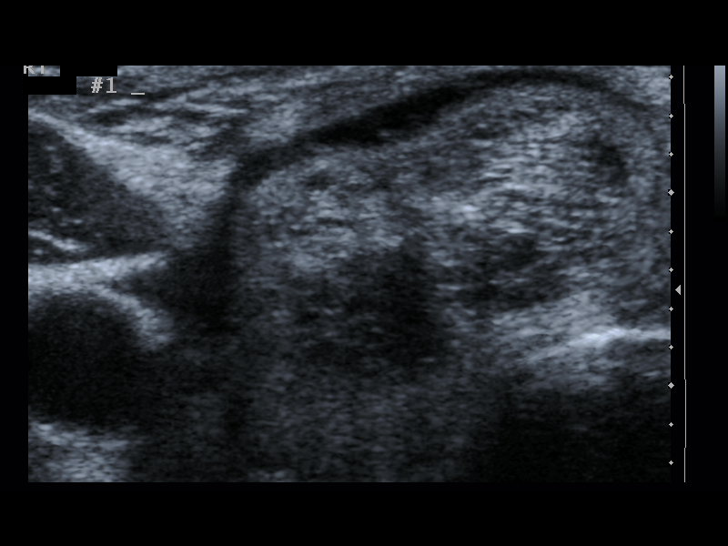
[im 6/14]
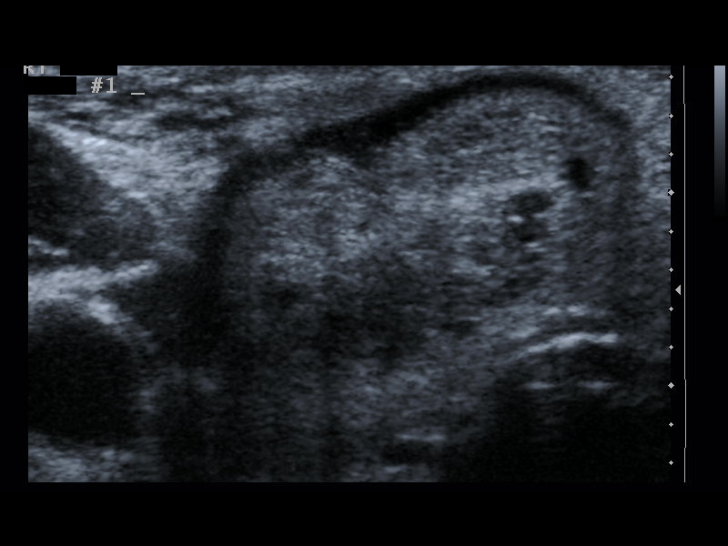
[im 8/14]
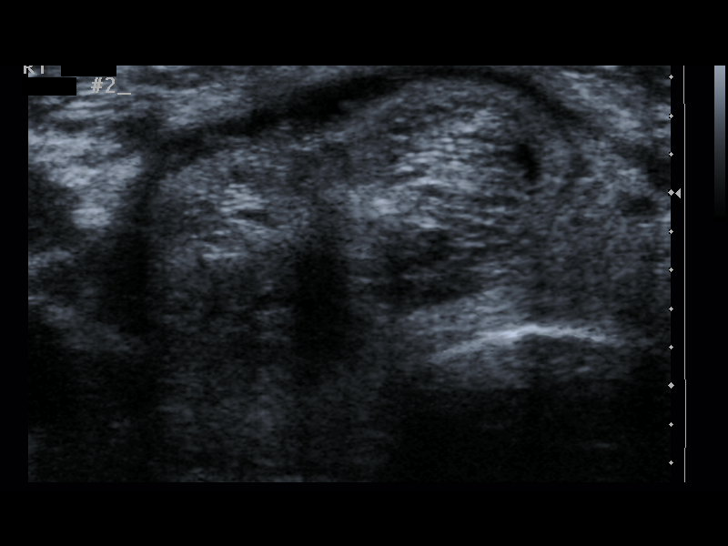
[im 9/14]
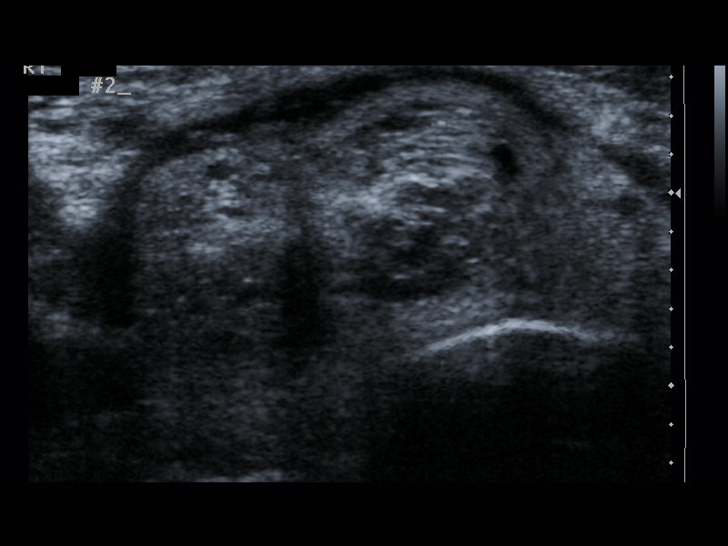
[im 10/14]
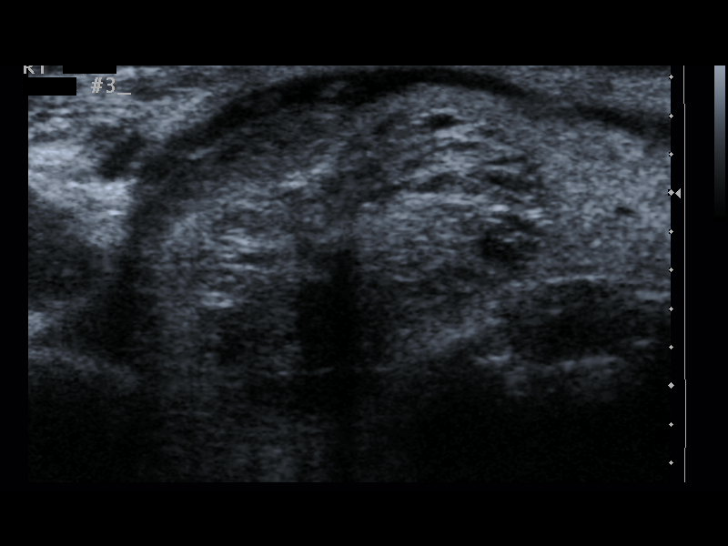
[im 11/14]
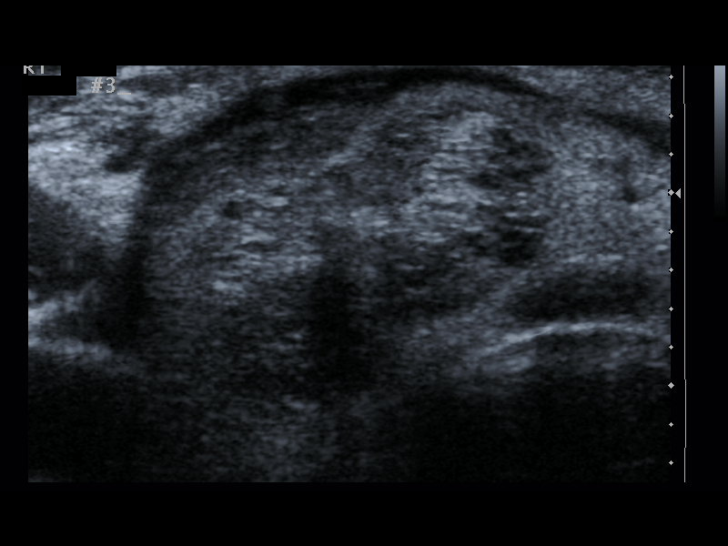
[im 12/14]
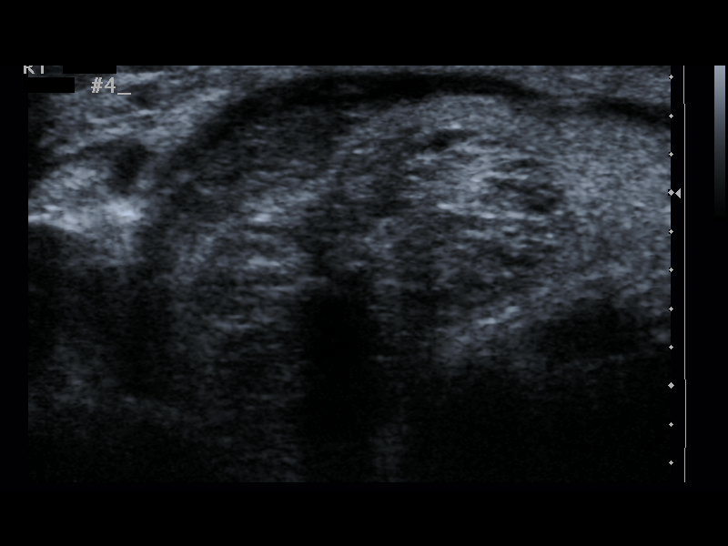
[im 13/14]
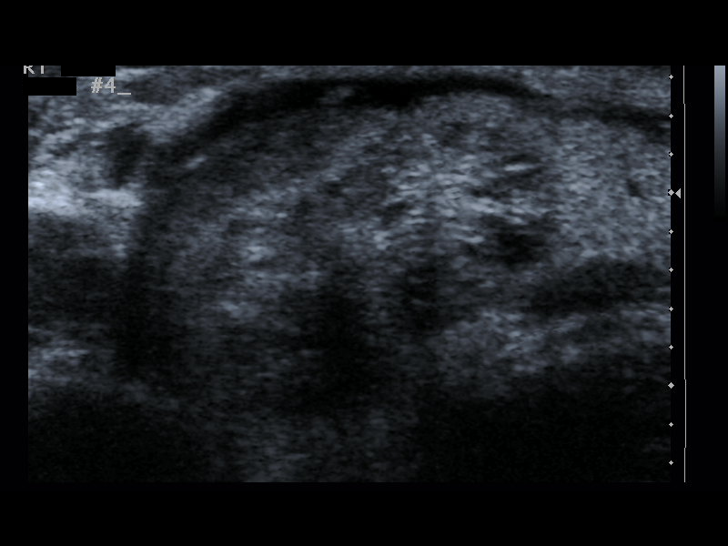
[im 14/14]
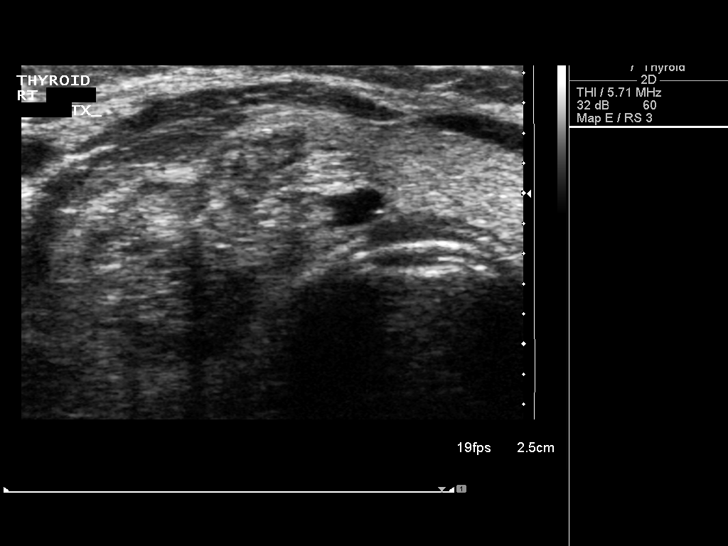

[13 of 14 positions shown; findings below may reference images not displayed]

MEDICATIONS:
1% lidocaine

COMPLICATIONS:
None immediate.

PROCEDURE:
Informed written consent was obtained from the patient after a
thorough discussion of the procedural risks, benefits and
alternatives. All questions were addressed. Maximal Sterile Barrier
Technique was utilized including caps, mask, sterile gowns, sterile
gloves, sterile drape, hand hygiene and skin antiseptic. A timeout
was performed prior to the initiation of the procedure.

Ultrasound was performed to localize and mark an adequate site for
the biopsy. The patient was then prepped and draped in a normal
sterile fashion. Local anesthesia was provided with 1% lidocaine.
Using direct ultrasound guidance, 4 passes were made using 25 gauge
needles into the nodule within the right mid lobe of the thyroid.
Ultrasound was used to confirm needle placements on all occasions.
Specimens were sent to Pathology for analysis.
IMPRESSION: Ultrasound guided needle aspirate biopsy performed of the right mid
thyroid nodule.

## 2017-09-29 ENCOUNTER — Other Ambulatory Visit: Payer: Self-pay | Admitting: Internal Medicine

## 2017-10-01 ENCOUNTER — Other Ambulatory Visit: Payer: Self-pay | Admitting: Internal Medicine

## 2017-10-04 DIAGNOSIS — H57813 Brow ptosis, bilateral: Secondary | ICD-10-CM | POA: Diagnosis not present

## 2017-10-04 DIAGNOSIS — H02831 Dermatochalasis of right upper eyelid: Secondary | ICD-10-CM | POA: Diagnosis not present

## 2017-10-04 DIAGNOSIS — H02423 Myogenic ptosis of bilateral eyelids: Secondary | ICD-10-CM | POA: Diagnosis not present

## 2017-10-04 DIAGNOSIS — H02834 Dermatochalasis of left upper eyelid: Secondary | ICD-10-CM | POA: Diagnosis not present

## 2017-10-04 DIAGNOSIS — H02413 Mechanical ptosis of bilateral eyelids: Secondary | ICD-10-CM | POA: Diagnosis not present

## 2017-11-01 ENCOUNTER — Other Ambulatory Visit: Payer: Self-pay | Admitting: Internal Medicine

## 2017-11-01 NOTE — Telephone Encounter (Signed)
Denied, needs office visit

## 2017-11-01 NOTE — Telephone Encounter (Signed)
Pt is requesting refill on lorazepam.   Last OV: 06/18/2017 w/ Edward Last Fill: 08/27/2017 #45 and 0RF (Pt sig: 1-2 tab qhs prn) UDS: 10/24/2016 Low risk  NCCR printed- no issues noted (Pt gets hydrocodone from Dr. Nelva Bush)   Please advise.

## 2017-11-02 ENCOUNTER — Other Ambulatory Visit: Payer: Self-pay | Admitting: Internal Medicine

## 2017-11-09 IMAGING — DX DG TIBIA/FIBULA 2V*L*
2 series · 2 of 2 positions shown · non-contrast
Comparison: None.

CLINICAL DATA: LEFT ankle swelling after falling off porch 2 weeks
ago. Pretibial bruising.

EXAM:
LEFT TIBIA AND FIBULA - 2 VIEW; LEFT ANKLE COMPLETE - 3+ VIEW

[tibia ap]
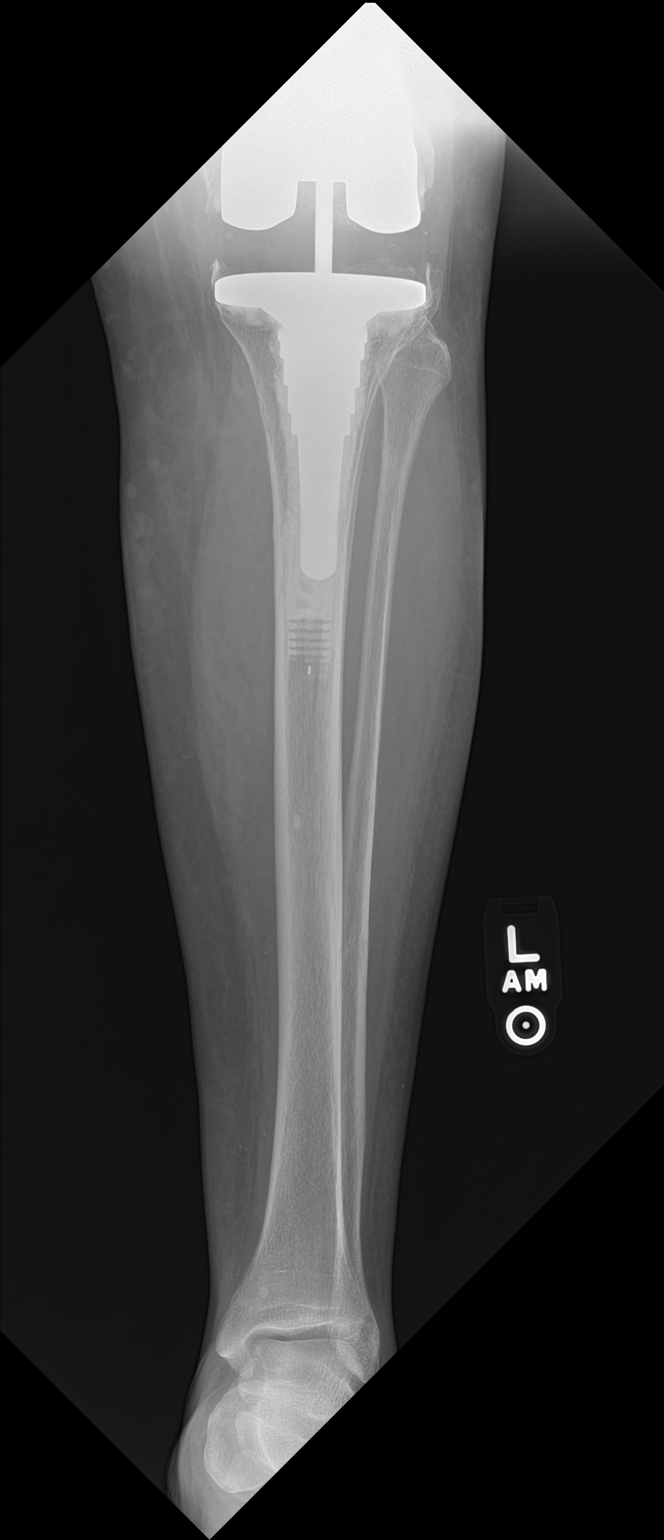

[tibia lat]
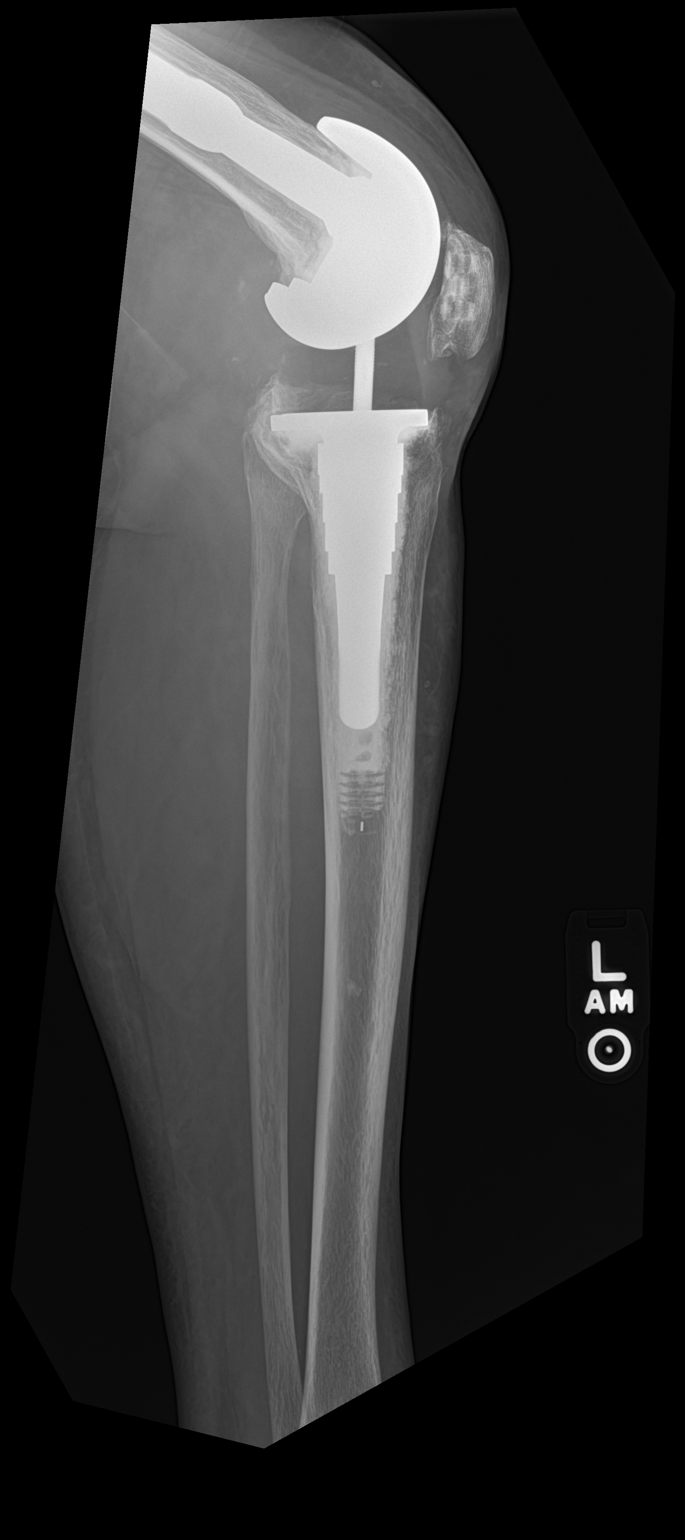

[2 of 2 positions shown; findings below may reference images not displayed]

FINDINGS: No fracture deformity nor dislocation. The ankle mortise appears
congruent and the tibiofibular syndesmosis intact. No destructive
bony lesions. No advanced degenerative change for age. Soft tissue
planes are non-suspicious. Pretibial phleboliths and mild proximal
pretibial soft tissue swelling without subcutaneous gas or
radiopaque foreign bodies.

Status post total knee arthroplasty, partially imaged.
IMPRESSION: No acute fracture deformity or dislocation.

## 2017-11-14 DIAGNOSIS — E0500 Thyrotoxicosis with diffuse goiter without thyrotoxic crisis or storm: Secondary | ICD-10-CM | POA: Diagnosis not present

## 2017-11-14 DIAGNOSIS — Z79899 Other long term (current) drug therapy: Secondary | ICD-10-CM | POA: Diagnosis not present

## 2017-11-14 DIAGNOSIS — E0590 Thyrotoxicosis, unspecified without thyrotoxic crisis or storm: Secondary | ICD-10-CM | POA: Diagnosis not present

## 2017-11-14 DIAGNOSIS — E042 Nontoxic multinodular goiter: Secondary | ICD-10-CM | POA: Diagnosis not present

## 2017-11-21 DIAGNOSIS — Z79891 Long term (current) use of opiate analgesic: Secondary | ICD-10-CM | POA: Diagnosis not present

## 2017-11-28 ENCOUNTER — Encounter: Payer: Self-pay | Admitting: Internal Medicine

## 2017-12-04 ENCOUNTER — Other Ambulatory Visit: Payer: Self-pay

## 2017-12-05 ENCOUNTER — Ambulatory Visit (INDEPENDENT_AMBULATORY_CARE_PROVIDER_SITE_OTHER): Payer: Medicare Other | Admitting: Internal Medicine

## 2017-12-05 ENCOUNTER — Encounter: Payer: Self-pay | Admitting: Internal Medicine

## 2017-12-05 VITALS — BP 122/70 | HR 55 | Temp 98.2°F | Resp 14 | Ht 65.0 in | Wt 160.1 lb

## 2017-12-05 DIAGNOSIS — E7800 Pure hypercholesterolemia, unspecified: Secondary | ICD-10-CM | POA: Diagnosis not present

## 2017-12-05 DIAGNOSIS — I10 Essential (primary) hypertension: Secondary | ICD-10-CM

## 2017-12-05 DIAGNOSIS — G629 Polyneuropathy, unspecified: Secondary | ICD-10-CM | POA: Diagnosis not present

## 2017-12-05 DIAGNOSIS — R634 Abnormal weight loss: Secondary | ICD-10-CM | POA: Diagnosis not present

## 2017-12-05 MED ORDER — TRAZODONE HCL 50 MG PO TABS: 25.0000 mg | ORAL_TABLET | Freq: Every evening | ORAL | 3 refills | Status: DC | PRN

## 2017-12-05 NOTE — Patient Instructions (Signed)
  GO TO THE FRONT DESK Schedule your next appointment for a checkup in 4 months  Schedule labs to be done at your convenience in the next few days.  Fasting.  Stop Ativan Start trazodone 50 mg: Half tablet at night Okay to increase to 1 tablet at night Call if no good results  HEALTHY SLEEP Sleep hygiene: Basic rules for a good night's sleep  Sleep only as much as you need to feel rested and then get out of bed  Keep a regular sleep schedule  Avoid forcing sleep  Exercise regularly for at least 20 minutes, preferably 4 to 5 hours before bedtime  Avoid caffeinated beverages after lunch  Avoid alcohol near bedtime: no "night cap"  Avoid smoking, especially in the evening  Do not go to bed hungry  Adjust bedroom environment  Avoid prolonged use of light-emitting screens before bedtime   Deal with your worries before bedtime   \

## 2017-12-05 NOTE — Progress Notes (Signed)
Subjective:    Patient ID: [name], female    DOB: 1940-12-06, 77 y.o.   MRN: [id number]  DOS:  12/05/2017 Type of visit - description : Routine visit, last seen by me  01/2017 Interval history: HTN: Good compliance with medication Insomnia: Ran out of Ativan a month ago, still having issues, would like to get off Ativan Pain: On hydrocodone prescribed by other doctor, sx not as well-controlled as she would like to.  Wt Readings from Last 3 Encounters:  12/05/17 160 lb 2 oz (72.6 kg)  06/18/17 144 lb 3.2 oz (65.4 kg)  01/25/17 153 lb (69.4 kg)     Review of Systems Denies depression or anxiety per se, she is somewhat frustrated about pain and neuropathy  Past Medical History:  Diagnosis Date  . Adenomatous colon polyp 03/2003  . Anxiety    ativan for sleep  . Baker's cyst   . Degenerative lumbar disc    Dr. Nelva Bush, epidural injections  . Goiter   . History of elevated homocysteine   . Hypertension 09/11/2005  . Lumbar radiculopathy    Dr. Nelva Bush, epidureal injections  . Lumbosacral radiculopathy    seeing Pain Management  . OA (osteoarthritis)     knee pain after TKR left , back pain- last steroid injection back 2 months ago Dr Nelva Bush  . PONV (postoperative nausea and vomiting)   . Postlaminectomy syndrome, lumbar region    Dr. Nelva Bush, recommended doing bilateral S1 transforaminal epidural with D5W  . Thyroid nodule    rt benign colloid nodule--left hyperplastic nodule- Thyroid bx 2006: neg    Past Surgical History:  Procedure Laterality Date  . ABDOMINAL HYSTERECTOMY    . BACK SURGERY  11-09-2011   at [facility], lumbar  . bakers cystectomy     right knee  . FACELIFT    . INCISIONAL HERNIA REPAIR    . KNEE ARTHROSCOPY     L   replacement 2007, Dr Corinne Ports, then a manipulation, then redo TKR 11/09 w/ Dr.Alusio. Parcial replacement again 09-2010  . KNEE SURGERY     x 5-  left  . OOPHORECTOMY    . SHOULDER SURGERY     replacements: L 2010, R 2012  . TOTAL HIP  ARTHROPLASTY  3/11   Right   . TOTAL KNEE ARTHROPLASTY  05/20/2012   Procedure: TOTAL KNEE ARTHROPLASTY;  Surgeon: Gearlean Alf, MD;  Location: [facility];  Service: Orthopedics;  Laterality: Right;    Social History   Socioeconomic History  . Marital status: Married    Spouse name: Not on file  . Number of children: 2  . Years of education: Not on file  . Highest education level: Not on file  Occupational History  . Occupation: Retired Pharmacist, hospital    Comment: retired    Fish farm manager: RETIRED  Social Needs  . Financial resource strain: Not on file  . Food insecurity:    Worry: Not on file    Inability: Not on file  . Transportation needs:    Medical: Not on file    Non-medical: Not on file  Tobacco Use  . Smoking status: Former Smoker    Last attempt to quit: 09/12/1983    Years since quitting: 34.2  . Smokeless tobacco: Never Used  Substance and Sexual Activity  . Alcohol use: Yes    Alcohol/week: 0.0 oz    Comment: occasional   . Drug use: No  . Sexual activity: Not on file  Lifestyle  . Physical  activity:    Days per week: Not on file    Minutes per session: Not on file  . Stress: Not on file  Relationships  . Social connections:    Talks on phone: Not on file    Gets together: Not on file    Attends religious service: Not on file    Active member of club or organization: Not on file    Attends meetings of clubs or organizations: Not on file    Relationship status: Not on file  . Intimate partner violence:    Fear of current or ex partner: Not on file    Emotionally abused: Not on file    Physically abused: Not on file    Forced sexual activity: Not on file  Other Topics Concern  . Not on file  Social History Narrative   Lives w/ husband of 4 years in a 3 story home. Has 2 sons.  Retired Education officer, museum.  Education: college.            Allergies as of 12/05/2017      Reactions   Tramadol Hives, Swelling   Per Pt she can take w/o reactions    Oxycodone-acetaminophen Nausea Only, Other (See Comments)   Headaches      Medication List        Accurate as of 12/05/17 11:45 AM. Always use your most recent med list.          CALCIUM + D PO Take 600 mg by mouth daily after breakfast.   gabapentin 800 MG tablet Commonly known as:  NEURONTIN TAKE 1 TABLET(800 MG) BY MOUTH FOUR TIMES DAILY   HYDROcodone-acetaminophen 5-325 MG tablet Commonly known as:  NORCO/VICODIN Take 1 tablet by mouth 2 (two) times daily as needed.   lisinopril-hydrochlorothiazide 20-12.5 MG tablet Commonly known as:  PRINZIDE,ZESTORETIC Take 1 tablet by mouth daily.   LORazepam 0.5 MG tablet Commonly known as:  ATIVAN TAKE 1 TO 2 TABLETS BY MOUTH EVERY NIGHT AT BEDTIME AS NEEDED FOR SLEEP   methimazole 10 MG tablet Commonly known as:  TAPAZOLE TAKE 1 TABLET(10 MG) BY MOUTH TWICE DAILY          Objective:   Physical Exam BP 122/70 (BP Location: Left Arm, Patient Position: Sitting, Cuff Size: Small)   Pulse (!) 55   Temp 98.2 F (36.8 C) (Oral)   Resp 14   Ht 5\' 5"  (1.651 m)   Wt 160 lb 2 oz (72.6 kg)   SpO2 97%   BMI 26.65 kg/m  General:   Well developed, well nourished . NAD.  HEENT:  Normocephalic . Face symmetric, atraumatic Lungs:  CTA B Normal respiratory effort, no intercostal retractions, no accessory muscle use. Heart: RRR,  no murmur.  No pretibial edema bilaterally  Skin: Not pale. Not jaundice Neurologic:  alert & oriented X3.  Speech normal, gait appropriate for age   Psych--  Cognition and judgment appear intact.  Cooperative with normal attention span and concentration.  Behavior appropriate. No anxious or depressed appearing.      Assessment & Plan:    Assessment HTN Hyperlipidemia Anxiety, insomnia, ativan rx per pcp Elevated homocysteine Goiter  Thyroid nodule BX 2006 neg, [place] 2013 stable [place] 02-2016 -- bx 03-2016 benign  MSK: sees pain mgmt ---DJD . ---Postlaminectomy syndrome, back surgery 2013    ---Intolerant to Lyrica, gabapentin helps --hydrocodone rx elsewhere -- Neuropathy: as off 11-2017 has seen 4 different specialists --chronic pain syndrome   Plan: Weight loss: See  last visit, resolved HTN: Continue Zestoretic, check a CMP Hyperlipidemia: Diet controlled, check a FLP Anxiety, insomnia: Ran out of Ativan a month ago, would like to try something different, will talk about other medications including Ambien, Remeron, nortriptyline, trazodone.  She asked me to pick one for her, I think trazodone is a good choice.  If not better will consider Remeron.  Prescription sent, start with a low dose.  Watch for side effects. Neuropathy: Not as well controlled as she would like it to, reportedly she has seen more than 4 different doctors, has tried a # of meds (I see a Rx for pregabalin from 2016).  For now continue gabapentin. At the end of the visit see mention her left ankle to be slightly swollen, on physical exam the lateral malleolus is indeed slightly puffy. Not warm, not tender to touch.  She walks without a limp him. Calves symmetric.  No actual pretibial edema.  We agreed that she will keep an eye on things and call me if the area get worse, or consult with her [facility] doctors. RTC for routine checkup  in 4 months.  Encouraged to RTC more frequently to give me the opportunity to review her preventive care  >> 25 min

## 2017-12-05 NOTE — Progress Notes (Signed)
Pre visit review using our clinic review tool, if applicable. No additional management support is needed unless otherwise documented below in the visit note. 

## 2017-12-06 NOTE — Assessment & Plan Note (Signed)
Plan: Weight loss: See last visit, resolved HTN: Continue Zestoretic, check a CMP Hyperlipidemia: Diet controlled, check a FLP Anxiety, insomnia: Ran out of Ativan a month ago, would like to try something different, will talk about other medications including Ambien, Remeron, nortriptyline, trazodone.  She asked me to pick one for her, I think trazodone is a good choice.  If not better will consider Remeron.  Prescription sent, start with a low dose.  Watch for side effects. Neuropathy: Not as well controlled as she would like it to, reportedly she has seen more than 4 different doctors, has tried a # of meds (I see a Rx for pregabalin from 2016).  For now continue gabapentin. At the end of the visit see mention her left ankle to be slightly swollen, on physical exam the lateral malleolus is indeed slightly puffy. Not warm, not tender to touch.  She walks without a limp him. Calves symmetric.  No actual pretibial edema.  We agreed that she will keep an eye on things and call me if the area get worse, or consult with her [facility] doctors. RTC for routine checkup  in 4 months.  Encouraged to RTC more frequently to give me the opportunity to review her preventive care

## 2017-12-08 ENCOUNTER — Other Ambulatory Visit: Payer: Self-pay | Admitting: Internal Medicine

## 2018-01-02 DIAGNOSIS — H04222 Epiphora due to insufficient drainage, left lacrimal gland: Secondary | ICD-10-CM | POA: Diagnosis not present

## 2018-01-02 DIAGNOSIS — H04221 Epiphora due to insufficient drainage, right lacrimal gland: Secondary | ICD-10-CM | POA: Diagnosis not present

## 2018-03-08 ENCOUNTER — Telehealth: Payer: Self-pay | Admitting: Internal Medicine

## 2018-03-08 NOTE — Telephone Encounter (Signed)
Please advise 

## 2018-03-08 NOTE — Telephone Encounter (Signed)
LMOM informing Pt that we did not see where she is prescribed Ambien- if she is getting that from another provider- recommended she discuss that with them.

## 2018-03-08 NOTE — Telephone Encounter (Signed)
Copied from [place] [id number]. Topic: Quick Communication - See Telephone Encounter >> Mar 08, 2018 11:03 AM Synthia Innocent wrote: CRM for notification. See Telephone encounter for: 03/08/18. Saw on the news that [name] is linked to dementia. Is concerned, should she stop taking this?

## 2018-03-08 NOTE — Telephone Encounter (Signed)
I do not see  I have prescribed Ambien, she is currently on trazodone

## 2018-03-18 DIAGNOSIS — Z79891 Long term (current) use of opiate analgesic: Secondary | ICD-10-CM | POA: Diagnosis not present

## 2018-03-18 DIAGNOSIS — M961 Postlaminectomy syndrome, not elsewhere classified: Secondary | ICD-10-CM | POA: Diagnosis not present

## 2018-03-29 DIAGNOSIS — E0590 Thyrotoxicosis, unspecified without thyrotoxic crisis or storm: Secondary | ICD-10-CM | POA: Diagnosis not present

## 2018-03-29 DIAGNOSIS — Z79899 Other long term (current) drug therapy: Secondary | ICD-10-CM | POA: Diagnosis not present

## 2018-03-29 DIAGNOSIS — E0500 Thyrotoxicosis with diffuse goiter without thyrotoxic crisis or storm: Secondary | ICD-10-CM | POA: Diagnosis not present

## 2018-03-29 DIAGNOSIS — E042 Nontoxic multinodular goiter: Secondary | ICD-10-CM | POA: Diagnosis not present

## 2018-04-08 ENCOUNTER — Ambulatory Visit (INDEPENDENT_AMBULATORY_CARE_PROVIDER_SITE_OTHER): Payer: Medicare Other | Admitting: Internal Medicine

## 2018-04-08 ENCOUNTER — Encounter: Payer: Self-pay | Admitting: Internal Medicine

## 2018-04-08 ENCOUNTER — Telehealth: Payer: Self-pay | Admitting: Internal Medicine

## 2018-04-08 VITALS — BP 124/68 | HR 62 | Temp 98.1°F | Resp 16 | Ht 65.0 in | Wt 162.2 lb

## 2018-04-08 DIAGNOSIS — I10 Essential (primary) hypertension: Secondary | ICD-10-CM | POA: Diagnosis not present

## 2018-04-08 DIAGNOSIS — G629 Polyneuropathy, unspecified: Secondary | ICD-10-CM

## 2018-04-08 DIAGNOSIS — F411 Generalized anxiety disorder: Secondary | ICD-10-CM | POA: Diagnosis not present

## 2018-04-08 DIAGNOSIS — E0590 Thyrotoxicosis, unspecified without thyrotoxic crisis or storm: Secondary | ICD-10-CM | POA: Diagnosis not present

## 2018-04-08 DIAGNOSIS — E7800 Pure hypercholesterolemia, unspecified: Secondary | ICD-10-CM

## 2018-04-08 MED ORDER — LORAZEPAM 0.5 MG PO TABS: ORAL_TABLET | ORAL | 3 refills | Status: DC

## 2018-04-08 NOTE — Telephone Encounter (Signed)
Copied from [place] [id number]. Topic: General - Other >> Apr 08, 2018  4:42 PM Cecelia Byars, NT wrote: Reason for CRM: [facility] called and said the request for  LORazepam (ATIVAN) 0.5 MG tablet has been approved for 1 t year beginning 04/08/18

## 2018-04-08 NOTE — Progress Notes (Signed)
Pre visit review using our clinic review tool, if applicable. No additional management support is needed unless otherwise documented below in the visit note. 

## 2018-04-08 NOTE — Progress Notes (Signed)
Subjective:    Patient ID: [name], female    DOB: 1941-08-21, 77 y.o.   MRN: [id number]  DOS:  04/08/2018 Type of visit - description : rov Interval history: Insomnia: Would like to go back on lorazepam Neuropathy: Still an issue, having a burning sensation day and night, worse at night.  Does not feel like she needs to move her legs constantly, symptoms are rather better when she is quiet. HTN: Normal ambulatory BPs Hypothyroidism: Follow-up by Endocrinology.  Last note reviewed.  Wt Readings from Last 3 Encounters:  04/08/18 162 lb 4 oz (73.6 kg)  12/05/17 160 lb 2 oz (72.6 kg)  06/18/17 144 lb 3.2 oz (65.4 kg)     Review of Systems No chest pain or difficulty breathing No nausea, vomiting, diarrhea.   Past Medical History:  Diagnosis Date  . Adenomatous colon polyp 03/2003  . Anxiety    ativan for sleep  . Baker's cyst   . Degenerative lumbar disc    Dr. Nelva Bush, epidural injections  . Goiter   . History of elevated homocysteine   . Hypertension 09/11/2005  . Lumbar radiculopathy    Dr. Nelva Bush, epidureal injections  . Lumbosacral radiculopathy    seeing Pain Management  . OA (osteoarthritis)     knee pain after TKR left , back pain- last steroid injection back 2 months ago Dr Nelva Bush  . PONV (postoperative nausea and vomiting)   . Postlaminectomy syndrome, lumbar region    Dr. Nelva Bush, recommended doing bilateral S1 transforaminal epidural with D5W  . Thyroid nodule    rt benign colloid nodule--left hyperplastic nodule- Thyroid bx 2006: neg    Past Surgical History:  Procedure Laterality Date  . ABDOMINAL HYSTERECTOMY    . BACK SURGERY  11-09-2011   at [facility], lumbar  . bakers cystectomy     right knee  . FACELIFT    . INCISIONAL HERNIA REPAIR    . KNEE ARTHROSCOPY     L   replacement 2007, Dr Corinne Ports, then a manipulation, then redo TKR 11/09 w/ Dr.Alusio. Parcial replacement again 09-2010  . KNEE SURGERY     x 5-  left  . OOPHORECTOMY    . SHOULDER SURGERY      replacements: L 2010, R 2012  . TOTAL HIP ARTHROPLASTY  3/11   Right   . TOTAL KNEE ARTHROPLASTY  05/20/2012   Procedure: TOTAL KNEE ARTHROPLASTY;  Surgeon: Gearlean Alf, MD;  Location: [facility];  Service: Orthopedics;  Laterality: Right;    Social History   Socioeconomic History  . Marital status: Married    Spouse name: Not on file  . Number of children: 2  . Years of education: Not on file  . Highest education level: Not on file  Occupational History  . Occupation: Retired Pharmacist, hospital    Comment: retired    Fish farm manager: RETIRED  Social Needs  . Financial resource strain: Not on file  . Food insecurity:    Worry: Not on file    Inability: Not on file  . Transportation needs:    Medical: Not on file    Non-medical: Not on file  Tobacco Use  . Smoking status: Former Smoker    Last attempt to quit: 09/12/1983    Years since quitting: 34.5  . Smokeless tobacco: Never Used  Substance and Sexual Activity  . Alcohol use: Yes    Alcohol/week: 0.0 oz    Comment: occasional   . Drug use: No  . Sexual activity:  Not on file  Lifestyle  . Physical activity:    Days per week: Not on file    Minutes per session: Not on file  . Stress: Not on file  Relationships  . Social connections:    Talks on phone: Not on file    Gets together: Not on file    Attends religious service: Not on file    Active member of club or organization: Not on file    Attends meetings of clubs or organizations: Not on file    Relationship status: Not on file  . Intimate partner violence:    Fear of current or ex partner: Not on file    Emotionally abused: Not on file    Physically abused: Not on file    Forced sexual activity: Not on file  Other Topics Concern  . Not on file  Social History Narrative   Lives w/ husband of 56 years in a 3 story home. Has 2 sons.  Retired Education officer, museum.  Education: college.            Allergies as of 04/08/2018      Reactions   Tramadol Hives, Swelling   Per Pt she  can take w/o reactions   Oxycodone-acetaminophen Nausea Only, Other (See Comments)   Headaches      Medication List        Accurate as of 04/08/18 11:59 PM. Always use your most recent med list.          CALCIUM + D PO Take 600 mg by mouth daily after breakfast.   gabapentin 800 MG tablet Commonly known as:  NEURONTIN TAKE 1 TABLET(800 MG) BY MOUTH FOUR TIMES DAILY   HYDROcodone-acetaminophen 5-325 MG tablet Commonly known as:  NORCO/VICODIN Take 1 tablet by mouth 2 (two) times daily as needed.   lisinopril-hydrochlorothiazide 20-12.5 MG tablet Commonly known as:  PRINZIDE,ZESTORETIC Take 1 tablet by mouth daily.   LORazepam 0.5 MG tablet Commonly known as:  ATIVAN TAKE 1 TO 2 TABLETS BY MOUTH EVERY NIGHT AT BEDTIME AS NEEDED FOR SLEEP   methimazole 10 MG tablet Commonly known as:  TAPAZOLE TAKE 1 TABLET(10 MG) BY MOUTH TWICE DAILY          Objective:   Physical Exam BP 124/68 (BP Location: Left Arm, Patient Position: Sitting, Cuff Size: Small)   Pulse 62   Temp 98.1 F (36.7 C) (Oral)   Resp 16   Ht 5\' 5"  (1.651 m)   Wt 162 lb 4 oz (73.6 kg)   SpO2 96%   BMI 27.00 kg/m  General:   Well developed, NAD, see BMI.  HEENT:  Normocephalic . Face symmetric, atraumatic Lungs:  CTA B Normal respiratory effort, no intercostal retractions, no accessory muscle use. Heart: RRR,  no murmur.  No pretibial edema bilaterally  Skin: Not pale. Not jaundice Neurologic:  alert & oriented X3.  Speech normal MSK: gait slt limited by DJD Psych--  Cognition and judgment appear intact.  Cooperative with normal attention span and concentration.  Behavior appropriate. No anxious or depressed appearing.      Assessment & Plan:   Assessment HTN Hyperlipidemia Anxiety, insomnia, ativan rx per pcp Elevated homocysteine Goiter  Thyroid nodule BX 2006 neg, [place] 2013 stable [place] 02-2016 -- bx 03-2016 benign  MSK: sees pain mgmt ---DJD . ---Postlaminectomy syndrome, back  surgery 2013  ---Intolerant to Lyrica, gabapentin helps --hydrocodone rx elsewhere -- Neuropathy: as off 11-2017 has seen 4 different specialists --chronic pain syndrome  Plan: HTN: Continue Zestoretic, check a BMP Hyperlipidemia: Diet controlled, check a FLP Anxiety, insomnia: See last visit, we tried trazodone, that did not work as well as lorazepam for the patient.  Likes to go back on lorazepam, prescription sent. Thyroid disease: Seen by Endo, last note reviewed, they check a  TFTs, CBC and LFTs. Neuropathy: Ongoing issue, on gabapentin, will see Dr. Nelva Bush soon, she is hoping a local injection will diminish her pain.  Advised to let me know if that is not the case, I will refer her to neurology. Preventive care: discussed  RTC 6 to 8 months

## 2018-04-08 NOTE — Telephone Encounter (Signed)
Already aware

## 2018-04-08 NOTE — Telephone Encounter (Signed)
Copied from [place] [id number]. Topic: Quick Communication - See Telephone Encounter >> Apr 08, 2018 12:41 PM Percell Belt A wrote: CRM for notification. See Telephone encounter for: 04/08/18.  Pt called in and stated that the next time she fills the LORazepam (ATIVAN) 0.5 MG tablet [[id number]] it is going to need a PA per pharmacy.  She is going to pay for it out of pocket this time but she would like to go head and to the PA on this med for next month   Pharamcy -[facility] on [address]

## 2018-04-08 NOTE — Telephone Encounter (Signed)
PA initiated via Covermymeds; KEY: [id number]. Awaiting determination.

## 2018-04-08 NOTE — Telephone Encounter (Signed)
PA approved. Effective from 04/08/2018 through 04/09/2019.

## 2018-04-08 NOTE — Patient Instructions (Addendum)
   GO TO THE FRONT DESK Schedule your next appointment for a  Check up in 6-8 months   Schedule labs to be done this week, fasting  Consider [facility]

## 2018-04-09 ENCOUNTER — Other Ambulatory Visit: Payer: Medicare Other

## 2018-04-09 NOTE — Assessment & Plan Note (Signed)
Had a normal DEXA 2017 -quit tobacco more than 15 years ago, does not qualify for a CT lung screening Shingrix discussed

## 2018-04-09 NOTE — Assessment & Plan Note (Signed)
HTN: Continue Zestoretic, check a BMP Hyperlipidemia: Diet controlled, check a FLP Anxiety, insomnia: See last visit, we tried trazodone, that did not work as well as lorazepam for the patient.  Likes to go back on lorazepam, prescription sent. Thyroid disease: Seen by Endo, last note reviewed, they check a  TFTs, CBC and LFTs. Neuropathy: Ongoing issue, on gabapentin, will see Dr. Nelva Bush soon, she is hoping a local injection will diminish her pain.  Advised to let me know if that is not the case, I will refer her to neurology. Preventive care: discussed  RTC 6 to 8 months

## 2018-04-10 ENCOUNTER — Other Ambulatory Visit (INDEPENDENT_AMBULATORY_CARE_PROVIDER_SITE_OTHER): Payer: Medicare Other

## 2018-04-10 DIAGNOSIS — E7800 Pure hypercholesterolemia, unspecified: Secondary | ICD-10-CM | POA: Diagnosis not present

## 2018-04-10 DIAGNOSIS — I10 Essential (primary) hypertension: Secondary | ICD-10-CM

## 2018-04-10 LAB — LIPID PANEL
Cholesterol: 268 mg/dL — ABNORMAL HIGH (ref 0–200)
HDL: 138.9 mg/dL (ref >39.00)
LDL Cholesterol: 116 mg/dL — ABNORMAL HIGH (ref 0–99)
NonHDL: 128.95
Total CHOL/HDL Ratio: 2
Triglycerides: 63 mg/dL (ref 0.0–149.0)
VLDL: 12.6 mg/dL (ref 0.0–40.0)

## 2018-04-10 LAB — COMPREHENSIVE METABOLIC PANEL
ALT: 11 U/L (ref 0–35)
AST: 22 U/L (ref 0–37)
Albumin: 4.5 g/dL (ref 3.5–5.2)
Alkaline Phosphatase: 81 U/L (ref 39–117)
BUN: 24 mg/dL — ABNORMAL HIGH (ref 6–23)
CO2: 34 mEq/L — ABNORMAL HIGH (ref 19–32)
Calcium: 9.9 mg/dL (ref 8.4–10.5)
Chloride: 100 mEq/L (ref 96–112)
Creatinine, Ser: 1.01 mg/dL (ref 0.40–1.20)
GFR: 56.44 mL/min — ABNORMAL LOW (ref >60.00)
Glucose, Bld: 96 mg/dL (ref 70–99)
Potassium: 4.7 mEq/L (ref 3.5–5.1)
Sodium: 141 mEq/L (ref 135–145)
Total Bilirubin: 0.7 mg/dL (ref 0.2–1.2)
Total Protein: 6.4 g/dL (ref 6.0–8.3)

## 2018-04-11 ENCOUNTER — Telehealth: Payer: Self-pay | Admitting: *Deleted

## 2018-04-11 DIAGNOSIS — M5136 Other intervertebral disc degeneration, lumbar region: Secondary | ICD-10-CM | POA: Diagnosis not present

## 2018-04-11 NOTE — Telephone Encounter (Signed)
Form completed and mailed to Pt as requested. Copy of form sent for scanning.

## 2018-04-14 ENCOUNTER — Other Ambulatory Visit: Payer: Self-pay | Admitting: Internal Medicine

## 2018-05-03 ENCOUNTER — Other Ambulatory Visit: Payer: Self-pay | Admitting: Internal Medicine

## 2018-05-03 ENCOUNTER — Encounter: Payer: Self-pay | Admitting: Internal Medicine

## 2018-05-03 MED ORDER — HYDROXYZINE HCL 10 MG PO TABS: 10.0000 mg | ORAL_TABLET | Freq: Every evening | ORAL | 1 refills | Status: DC | PRN

## 2018-05-07 ENCOUNTER — Encounter: Payer: Self-pay | Admitting: *Deleted

## 2018-05-07 NOTE — Telephone Encounter (Signed)
Received message from the lab on Potassium from late 05/06/18 = 6.1. Please advise?

## 2018-05-07 NOTE — Telephone Encounter (Signed)
Wrong chart, the right patient has been notified

## 2018-05-08 ENCOUNTER — Telehealth: Payer: Self-pay | Admitting: *Deleted

## 2018-05-08 NOTE — Telephone Encounter (Signed)
Received fax from [facility] stating hydroxyzine requires PA. ID# [id number]. Was directed to [phone] and states [employer] does their own PA for [facility] and it is not handled by [facility]. Called and spoke with Almyra Free at [facility]. Wanted to know if Provider had documented and spoken with pt about potential risks / benefits of this medication? Yes, provider has addressed. Request sent for review. Awaiting determination.

## 2018-05-09 NOTE — Telephone Encounter (Signed)
[name] with [employer] stated that this medication was approved through 05/09/2019. Pharmacy has been notified.

## 2018-05-10 NOTE — Telephone Encounter (Signed)
Notified pt and she voices understanding. 

## 2018-05-31 DIAGNOSIS — E0500 Thyrotoxicosis with diffuse goiter without thyrotoxic crisis or storm: Secondary | ICD-10-CM | POA: Diagnosis not present

## 2018-05-31 DIAGNOSIS — E0590 Thyrotoxicosis, unspecified without thyrotoxic crisis or storm: Secondary | ICD-10-CM | POA: Diagnosis not present

## 2018-05-31 DIAGNOSIS — Z79899 Other long term (current) drug therapy: Secondary | ICD-10-CM | POA: Diagnosis not present

## 2018-05-31 LAB — HEPATIC FUNCTION PANEL
ALT: 10 (ref 7–35)
AST: 21 (ref 13–35)
Alkaline Phosphatase: 77 (ref 25–125)
Bilirubin, Direct: 0.1 (ref 0.01–0.4)
Bilirubin, Total: 0.5

## 2018-05-31 LAB — CBC AND DIFFERENTIAL
HCT: 41 (ref 36–46)
Hemoglobin: 13.6 (ref 12.0–16.0)
Platelets: 262 (ref 150–399)
WBC: 8.6

## 2018-05-31 LAB — TSH: TSH: 2 (ref 0.41–5.90)

## 2018-06-05 DIAGNOSIS — Z23 Encounter for immunization: Secondary | ICD-10-CM | POA: Diagnosis not present

## 2018-07-05 DIAGNOSIS — Z961 Presence of intraocular lens: Secondary | ICD-10-CM | POA: Diagnosis not present

## 2018-07-25 ENCOUNTER — Other Ambulatory Visit: Payer: Self-pay | Admitting: Internal Medicine

## 2018-07-31 ENCOUNTER — Other Ambulatory Visit: Payer: Self-pay | Admitting: Internal Medicine

## 2018-07-31 DIAGNOSIS — E042 Nontoxic multinodular goiter: Secondary | ICD-10-CM

## 2018-07-31 DIAGNOSIS — E0590 Thyrotoxicosis, unspecified without thyrotoxic crisis or storm: Secondary | ICD-10-CM | POA: Diagnosis not present

## 2018-07-31 DIAGNOSIS — E0500 Thyrotoxicosis with diffuse goiter without thyrotoxic crisis or storm: Secondary | ICD-10-CM

## 2018-07-31 DIAGNOSIS — R490 Dysphonia: Secondary | ICD-10-CM | POA: Diagnosis not present

## 2018-07-31 DIAGNOSIS — Z5181 Encounter for therapeutic drug level monitoring: Secondary | ICD-10-CM | POA: Diagnosis not present

## 2018-08-05 ENCOUNTER — Ambulatory Visit
Admission: RE | Admit: 2018-08-05 | Discharge: 2018-08-05 | Disposition: A | Discharge status: Home / Self Care | Payer: Medicare Other | Source: Ambulatory Visit | Attending: Internal Medicine | Admitting: Internal Medicine

## 2018-08-05 DIAGNOSIS — E0500 Thyrotoxicosis with diffuse goiter without thyrotoxic crisis or storm: Secondary | ICD-10-CM

## 2018-08-05 DIAGNOSIS — E042 Nontoxic multinodular goiter: Secondary | ICD-10-CM

## 2018-08-09 ENCOUNTER — Other Ambulatory Visit: Payer: Self-pay | Admitting: Internal Medicine

## 2018-08-09 MED ORDER — LISINOPRIL-HYDROCHLOROTHIAZIDE 20-12.5 MG PO TABS: 1.0000 | ORAL_TABLET | Freq: Every day | ORAL | 0 refills | Status: DC

## 2018-08-09 NOTE — Telephone Encounter (Signed)
Copied from [place] [id number]. Topic: Quick Communication - Rx Refill/Question >> Aug 09, 2018  8:49 AM Reyne Dumas L wrote: Medication:  lisinopril-hydrochlorothiazide (PRINZIDE,ZESTORETIC) 20-12.5  Pt states that she normally get a 90 day supply, but pt states that they only gave her 30 pills with no refills.    Pt can be reached at [phone] or [phone]  Preferred Pharmacy (with phone number or street name): [id number] - [place], [address] AT [place] [phone] (Phone) [phone] (Fax)  Agent: Please be advised that RX refills may take up to 3 business days. We ask that you follow-up with your pharmacy.

## 2018-08-12 DIAGNOSIS — Z79891 Long term (current) use of opiate analgesic: Secondary | ICD-10-CM | POA: Diagnosis not present

## 2018-08-12 DIAGNOSIS — Z79899 Other long term (current) drug therapy: Secondary | ICD-10-CM | POA: Diagnosis not present

## 2018-08-12 DIAGNOSIS — Z5181 Encounter for therapeutic drug level monitoring: Secondary | ICD-10-CM | POA: Diagnosis not present

## 2018-08-12 DIAGNOSIS — J383 Other diseases of vocal cords: Secondary | ICD-10-CM | POA: Diagnosis not present

## 2018-08-12 DIAGNOSIS — E0590 Thyrotoxicosis, unspecified without thyrotoxic crisis or storm: Secondary | ICD-10-CM | POA: Diagnosis not present

## 2018-08-12 DIAGNOSIS — J343 Hypertrophy of nasal turbinates: Secondary | ICD-10-CM | POA: Diagnosis not present

## 2018-08-12 DIAGNOSIS — G894 Chronic pain syndrome: Secondary | ICD-10-CM | POA: Diagnosis not present

## 2018-08-12 DIAGNOSIS — M5136 Other intervertebral disc degeneration, lumbar region: Secondary | ICD-10-CM | POA: Diagnosis not present

## 2018-08-12 DIAGNOSIS — Z87891 Personal history of nicotine dependence: Secondary | ICD-10-CM | POA: Diagnosis not present

## 2018-08-12 DIAGNOSIS — Z7289 Other problems related to lifestyle: Secondary | ICD-10-CM | POA: Diagnosis not present

## 2018-08-26 ENCOUNTER — Encounter: Payer: Self-pay | Admitting: Internal Medicine

## 2018-09-04 ENCOUNTER — Other Ambulatory Visit: Payer: Self-pay | Admitting: Internal Medicine

## 2018-09-07 ENCOUNTER — Other Ambulatory Visit: Payer: Self-pay | Admitting: Internal Medicine

## 2018-09-26 ENCOUNTER — Other Ambulatory Visit: Payer: Self-pay | Admitting: Internal Medicine

## 2018-10-17 DIAGNOSIS — M47816 Spondylosis without myelopathy or radiculopathy, lumbar region: Secondary | ICD-10-CM | POA: Diagnosis not present

## 2018-11-04 ENCOUNTER — Encounter: Payer: Self-pay | Admitting: Internal Medicine

## 2018-11-04 ENCOUNTER — Ambulatory Visit (INDEPENDENT_AMBULATORY_CARE_PROVIDER_SITE_OTHER): Payer: Medicare Other | Admitting: Internal Medicine

## 2018-11-04 VITALS — BP 124/68 | HR 41 | Temp 98.1°F | Resp 18 | Ht 65.0 in | Wt 168.1 lb

## 2018-11-04 DIAGNOSIS — E7800 Pure hypercholesterolemia, unspecified: Secondary | ICD-10-CM

## 2018-11-04 DIAGNOSIS — I10 Essential (primary) hypertension: Secondary | ICD-10-CM | POA: Diagnosis not present

## 2018-11-04 DIAGNOSIS — F411 Generalized anxiety disorder: Secondary | ICD-10-CM | POA: Diagnosis not present

## 2018-11-04 LAB — BASIC METABOLIC PANEL
BUN: 22 mg/dL (ref 6–23)
CO2: 31 mEq/L (ref 19–32)
Calcium: 9.8 mg/dL (ref 8.4–10.5)
Chloride: 102 mEq/L (ref 96–112)
Creatinine, Ser: 0.97 mg/dL (ref 0.40–1.20)
GFR: 55.56 mL/min — ABNORMAL LOW (ref >60.00)
Glucose, Bld: 81 mg/dL (ref 70–99)
Potassium: 4.7 mEq/L (ref 3.5–5.1)
Sodium: 140 mEq/L (ref 135–145)

## 2018-11-04 MED ORDER — ZOSTER VAC RECOMB ADJUVANTED 50 MCG/0.5ML IM SUSR: 0.5000 mL | Freq: Once | INTRAMUSCULAR | 1 refills | Status: AC

## 2018-11-04 MED ORDER — LORAZEPAM 0.5 MG PO TABS
ORAL_TABLET | ORAL | 3 refills | Status: DC
Start: 2018-11-04 — End: 2019-06-02

## 2018-11-04 NOTE — Progress Notes (Signed)
Pre visit review using our clinic review tool, if applicable. No additional management support is needed unless otherwise documented below in the visit note. 

## 2018-11-04 NOTE — Assessment & Plan Note (Signed)
HTN: Continue with Zestoretic, check a BMP Hyperlipidemia: Diet controlled, last FLP satisfactory. Anxiety, insomnia: Contract today, refilled Ativan noting that she takes hydrocodone for pain management. Neuropathy, chronic pain: Managed elsewhere. Preventive care: Discussed RTC 6 months

## 2018-11-04 NOTE — Progress Notes (Signed)
Subjective:    Patient ID: [name], female    DOB: 14-Nov-1940, 78 y.o.   MRN: [id number]  DOS:  11/04/2018 Type of visit - description: f/u HTN: No ambulatory BPs, due for labs Chronic pain: On daily hydrocodone, take 2 tablets daily, wonders if that is excessive. Thyroid disease: Last seen by Endo few months ago, note reviewed Anxiety, insomnia: Request a refill, needs a contract. Dysphonia: Saw ENT, note reviewed.  Review of Systems   Past Medical History:  Diagnosis Date  . Adenomatous colon polyp 03/2003  . Anxiety    ativan for sleep  . Baker's cyst   . Degenerative lumbar disc    Dr. Nelva Bush, epidural injections  . Goiter   . History of elevated homocysteine   . Hypertension 09/11/2005  . Lumbar radiculopathy    Dr. Nelva Bush, epidureal injections  . Lumbosacral radiculopathy    seeing Pain Management  . OA (osteoarthritis)     knee pain after TKR left , back pain- last steroid injection back 2 months ago Dr Nelva Bush  . PONV (postoperative nausea and vomiting)   . Postlaminectomy syndrome, lumbar region    Dr. Nelva Bush, recommended doing bilateral S1 transforaminal epidural with D5W  . Thyroid nodule    rt benign colloid nodule--left hyperplastic nodule- Thyroid bx 2006: neg    Past Surgical History:  Procedure Laterality Date  . ABDOMINAL HYSTERECTOMY    . BACK SURGERY  11-09-2011   at [facility], lumbar  . bakers cystectomy     right knee  . FACELIFT    . INCISIONAL HERNIA REPAIR    . KNEE ARTHROSCOPY     L   replacement 2007, Dr Corinne Ports, then a manipulation, then redo TKR 11/09 w/ Dr.Alusio. Parcial replacement again 09-2010  . KNEE SURGERY     x 5-  left  . OOPHORECTOMY    . SHOULDER SURGERY     replacements: L 2010, R 2012  . TOTAL HIP ARTHROPLASTY  3/11   Right   . TOTAL KNEE ARTHROPLASTY  05/20/2012   Procedure: TOTAL KNEE ARTHROPLASTY;  Surgeon: Gearlean Alf, MD;  Location: [facility];  Service: Orthopedics;  Laterality: Right;    Social History    Socioeconomic History  . Marital status: Married    Spouse name: Not on file  . Number of children: 2  . Years of education: Not on file  . Highest education level: Not on file  Occupational History  . Occupation: Retired Pharmacist, hospital    Comment: retired    Fish farm manager: RETIRED  Social Needs  . Financial resource strain: Not on file  . Food insecurity:    Worry: Not on file    Inability: Not on file  . Transportation needs:    Medical: Not on file    Non-medical: Not on file  Tobacco Use  . Smoking status: Former Smoker    Last attempt to quit: 09/12/1983    Years since quitting: 35.1  . Smokeless tobacco: Never Used  Substance and Sexual Activity  . Alcohol use: Yes    Alcohol/week: 0.0 standard drinks    Comment: occasional   . Drug use: No  . Sexual activity: Not on file  Lifestyle  . Physical activity:    Days per week: Not on file    Minutes per session: Not on file  . Stress: Not on file  Relationships  . Social connections:    Talks on phone: Not on file    Gets together: Not on  file    Attends religious service: Not on file    Active member of club or organization: Not on file    Attends meetings of clubs or organizations: Not on file    Relationship status: Not on file  . Intimate partner violence:    Fear of current or ex partner: Not on file    Emotionally abused: Not on file    Physically abused: Not on file    Forced sexual activity: Not on file  Other Topics Concern  . Not on file  Social History Narrative   Lives w/ husband of 85 years in a 3 story home. Has 2 sons.  Retired Education officer, museum.  Education: college.            Allergies as of 11/04/2018      Reactions   Tramadol Hives, Swelling   Per Pt she can take w/o reactions   Oxycodone-acetaminophen Nausea Only, Other (See Comments)   Headaches      Medication List       Accurate as of November 04, 2018  5:10 PM. Always use your most recent med list.        CALCIUM + D PO Take 600 mg by  mouth daily after breakfast.   gabapentin 800 MG tablet Commonly known as:  NEURONTIN TAKE 1 TABLET(800 MG) BY MOUTH FOUR TIMES DAILY   HYDROcodone-acetaminophen 5-325 MG tablet Commonly known as:  NORCO/VICODIN Take 1 tablet by mouth 2 (two) times daily as needed.   hydrOXYzine 10 MG tablet Commonly known as:  ATARAX/VISTARIL Take 1-2 tablets (10-20 mg total) by mouth at bedtime as needed.   lisinopril-hydrochlorothiazide 20-12.5 MG tablet Commonly known as:  PRINZIDE,ZESTORETIC Take 1 tablet by mouth daily.   LORazepam 0.5 MG tablet Commonly known as:  ATIVAN TAKE 1 TO 2 TABLETS BY MOUTH EVERY NIGHT AT BEDTIME AS NEEDED FOR SLEEP   methimazole 10 MG tablet Commonly known as:  TAPAZOLE TAKE 1 TABLET(10 MG) BY MOUTH TWICE DAILY   Zoster Vaccine Adjuvanted injection Commonly known as:  SHINGRIX Inject 0.5 mLs into the muscle once for 1 dose.           Objective:   Physical Exam BP 124/68 (BP Location: Left Arm, Patient Position: Sitting, Cuff Size: Small)   Pulse (!) 41   Temp 98.1 F (36.7 C) (Oral)   Resp 18   Ht 5\' 5"  (1.651 m)   Wt 168 lb 2 oz (76.3 kg)   SpO2 98%   BMI 27.98 kg/m  General:   Well developed, NAD, BMI noted. HEENT:  Normocephalic . Face symmetric, atraumatic Lungs:  CTA B Normal respiratory effort, no intercostal retractions, no accessory muscle use. Heart: RRR,  no murmur.  No pretibial edema bilaterally  Skin: Not pale. Not jaundice Neurologic:  alert & oriented X3.  Speech normal, gait unassisted but limited by DJD Psych--  Cognition and judgment appear intact.  Cooperative with normal attention span and concentration.  Behavior appropriate. No anxious or depressed appearing.      Assessment     Assessment HTN Hyperlipidemia Anxiety, insomnia, ativan rx per pcp Elevated homocysteine Goiter  Thyroid nodule BX 2006 neg, [place] 2013 stable [place] 02-2016 -- bx 03-2016 benign  MSK: sees pain mgmt ---DJD . ---Postlaminectomy  syndrome, back surgery 2013  ---Intolerant to Lyrica, gabapentin helps --hydrocodone rx elsewhere -- Neuropathy: as off 11-2017 has seen 4 different specialists --chronic pain syndrome   Plan: HTN: Continue with Zestoretic, check a BMP Hyperlipidemia: Diet  controlled, last FLP satisfactory. Anxiety, insomnia: Contract today, refilled Ativan noting that she takes hydrocodone for pain management. Neuropathy, chronic pain: Managed elsewhere. Preventive care: Discussed RTC 6 months

## 2018-11-04 NOTE — Patient Instructions (Addendum)
Please schedule Medicare Wellness visit with [name].   GO TO THE LAB : Get the blood work     GO TO THE FRONT DESK Schedule your next appointment   routine checkup in 6 months

## 2018-11-04 NOTE — Assessment & Plan Note (Signed)
Preventive care discussed Shingrix discussed.  Rx printed Last mammogram 2018 DEXA (-) 2017, no history of fractures, repeat q 5 years CCS: Due for a colonoscopy, last 2 colonoscopies negative, pro-cons discussed, elected no further screening

## 2018-11-09 ENCOUNTER — Encounter: Payer: Self-pay | Admitting: Gastroenterology

## 2018-11-12 DIAGNOSIS — H43812 Vitreous degeneration, left eye: Secondary | ICD-10-CM | POA: Diagnosis not present

## 2018-11-12 DIAGNOSIS — Z961 Presence of intraocular lens: Secondary | ICD-10-CM | POA: Diagnosis not present

## 2019-01-29 DIAGNOSIS — M47896 Other spondylosis, lumbar region: Secondary | ICD-10-CM | POA: Diagnosis not present

## 2019-02-13 DIAGNOSIS — M47816 Spondylosis without myelopathy or radiculopathy, lumbar region: Secondary | ICD-10-CM | POA: Diagnosis not present

## 2019-02-25 ENCOUNTER — Other Ambulatory Visit: Payer: Self-pay | Admitting: Internal Medicine

## 2019-03-24 ENCOUNTER — Other Ambulatory Visit: Payer: Self-pay | Admitting: Internal Medicine

## 2019-05-21 DIAGNOSIS — H10412 Chronic giant papillary conjunctivitis, left eye: Secondary | ICD-10-CM | POA: Diagnosis not present

## 2019-05-30 ENCOUNTER — Telehealth: Payer: Self-pay | Admitting: Internal Medicine

## 2019-06-02 NOTE — Telephone Encounter (Signed)
Sent x 1

## 2019-06-02 NOTE — Telephone Encounter (Signed)
Lorazepam refill.   Last OV: 11/04/2018, no future appt scheduled Last Fill: 11/04/2018 #60 and 3RF Pt sig:1-2 qhs prn UDS: At Dr. Nelva Bush

## 2019-06-04 DIAGNOSIS — G894 Chronic pain syndrome: Secondary | ICD-10-CM | POA: Diagnosis not present

## 2019-06-04 DIAGNOSIS — M47896 Other spondylosis, lumbar region: Secondary | ICD-10-CM | POA: Diagnosis not present

## 2019-06-04 DIAGNOSIS — Z79891 Long term (current) use of opiate analgesic: Secondary | ICD-10-CM | POA: Diagnosis not present

## 2019-06-20 ENCOUNTER — Telehealth: Payer: Self-pay

## 2019-06-20 NOTE — Telephone Encounter (Signed)
PA initiated via Covermymeds; KEY: [id number]. Awaiting determination.

## 2019-06-20 NOTE — Telephone Encounter (Signed)
PA approved. Effective from 06/20/2019 through 06/19/2020.

## 2019-06-23 DIAGNOSIS — Z23 Encounter for immunization: Secondary | ICD-10-CM | POA: Diagnosis not present

## 2019-07-02 ENCOUNTER — Other Ambulatory Visit: Payer: Self-pay | Admitting: Internal Medicine

## 2019-07-18 ENCOUNTER — Other Ambulatory Visit: Payer: Self-pay

## 2019-07-21 ENCOUNTER — Encounter: Payer: Self-pay | Admitting: Internal Medicine

## 2019-07-21 ENCOUNTER — Other Ambulatory Visit: Payer: Self-pay

## 2019-07-21 ENCOUNTER — Ambulatory Visit (INDEPENDENT_AMBULATORY_CARE_PROVIDER_SITE_OTHER): Payer: Medicare Other | Admitting: Internal Medicine

## 2019-07-21 VITALS — BP 100/67 | HR 65 | Temp 97.6°F | Resp 16 | Ht 65.0 in | Wt 158.5 lb

## 2019-07-21 DIAGNOSIS — E0590 Thyrotoxicosis, unspecified without thyrotoxic crisis or storm: Secondary | ICD-10-CM

## 2019-07-21 DIAGNOSIS — I10 Essential (primary) hypertension: Secondary | ICD-10-CM | POA: Diagnosis not present

## 2019-07-21 DIAGNOSIS — F411 Generalized anxiety disorder: Secondary | ICD-10-CM

## 2019-07-21 DIAGNOSIS — E7800 Pure hypercholesterolemia, unspecified: Secondary | ICD-10-CM | POA: Diagnosis not present

## 2019-07-21 LAB — CBC WITH DIFFERENTIAL/PLATELET
Basophils Absolute: 0 10*3/uL (ref 0.0–0.1)
Basophils Relative: 0.5 % (ref 0.0–3.0)
Eosinophils Absolute: 0.2 10*3/uL (ref 0.0–0.7)
Eosinophils Relative: 1.9 % (ref 0.0–5.0)
HCT: 41.3 % (ref 36.0–46.0)
Hemoglobin: 13.5 g/dL (ref 12.0–15.0)
Lymphocytes Relative: 28.3 % (ref 12.0–46.0)
Lymphs Abs: 2.4 10*3/uL (ref 0.7–4.0)
MCHC: 32.7 g/dL (ref 30.0–36.0)
MCV: 93.9 fl (ref 78.0–100.0)
Monocytes Absolute: 0.7 10*3/uL (ref 0.1–1.0)
Monocytes Relative: 8.1 % (ref 3.0–12.0)
Neutro Abs: 5.3 10*3/uL (ref 1.4–7.7)
Neutrophils Relative %: 61.2 % (ref 43.0–77.0)
Platelets: 284 10*3/uL (ref 150.0–400.0)
RBC: 4.4 Mil/uL (ref 3.87–5.11)
RDW: 13.7 % (ref 11.5–15.5)
WBC: 8.6 10*3/uL (ref 4.0–10.5)

## 2019-07-21 LAB — LIPID PANEL
Cholesterol: 268 mg/dL — ABNORMAL HIGH (ref 0–200)
HDL: 130 mg/dL (ref >39.00)
LDL Cholesterol: 119 mg/dL — ABNORMAL HIGH (ref 0–99)
NonHDL: 137.68
Total CHOL/HDL Ratio: 2
Triglycerides: 93 mg/dL (ref 0.0–149.0)
VLDL: 18.6 mg/dL (ref 0.0–40.0)

## 2019-07-21 LAB — BASIC METABOLIC PANEL
BUN: 29 mg/dL — ABNORMAL HIGH (ref 6–23)
CO2: 31 mEq/L (ref 19–32)
Calcium: 9.7 mg/dL (ref 8.4–10.5)
Chloride: 99 mEq/L (ref 96–112)
Creatinine, Ser: 1.09 mg/dL (ref 0.40–1.20)
GFR: 48.47 mL/min — ABNORMAL LOW (ref >60.00)
Glucose, Bld: 91 mg/dL (ref 70–99)
Potassium: 4.6 mEq/L (ref 3.5–5.1)
Sodium: 137 mEq/L (ref 135–145)

## 2019-07-21 MED ORDER — LORAZEPAM 0.5 MG PO TABS: 0.5000 mg | ORAL_TABLET | Freq: Every evening | ORAL | 2 refills | Status: DC | PRN

## 2019-07-21 NOTE — Progress Notes (Signed)
Pre visit review using our clinic review tool, if applicable. No additional management support is needed unless otherwise documented below in the visit note. 

## 2019-07-21 NOTE — Progress Notes (Signed)
Subjective:    Patient ID: [name], female    DOB: [birth year], 78 y.o.   MRN: [id number]  DOS:  07/21/2019 Type of visit - description: Routine visit HTN: Good med compliance, BP today is slightly low but she has no symptoms. Neuropathy: Unchanged Anxiety, insomnia: Needs a refill on Ativan. Hypothyroidism: Today she reports she has not taken Tapazole in about a year.  Has not seen endocrine recently.  Is her observation that the thyroid nodule has increased in size.  She also occasionally has hoarseness  Review of Systems  Denies chest pain or difficulty breathing No nausea, vomiting, diarrhea  Past Medical History:  Diagnosis Date  . Adenomatous colon polyp 03/2003  . Anxiety    ativan for sleep  . Baker's cyst   . Degenerative lumbar disc    Dr. Nelva Bush, epidural injections  . Goiter   . History of elevated homocysteine   . Hypertension 09/11/2005  . Lumbar radiculopathy    Dr. Nelva Bush, epidureal injections  . Lumbosacral radiculopathy    seeing Pain Management  . OA (osteoarthritis)     knee pain after TKR left , back pain- last steroid injection back 2 months ago Dr Nelva Bush  . PONV (postoperative nausea and vomiting)   . Postlaminectomy syndrome, lumbar region    Dr. Nelva Bush, recommended doing bilateral S1 transforaminal epidural with D5W  . Thyroid nodule    rt benign colloid nodule--left hyperplastic nodule- Thyroid bx 2006: neg    Past Surgical History:  Procedure Laterality Date  . ABDOMINAL HYSTERECTOMY    . BACK SURGERY  11-09-2011   at [facility], lumbar  . bakers cystectomy     right knee  . FACELIFT    . INCISIONAL HERNIA REPAIR    . KNEE ARTHROSCOPY     L   replacement 2007, Dr Corinne Ports, then a manipulation, then redo TKR 11/09 w/ Dr.Alusio. Parcial replacement again 09-2010  . KNEE SURGERY     x 5-  left  . OOPHORECTOMY    . SHOULDER SURGERY     replacements: L 2010, R 2012  . TOTAL HIP ARTHROPLASTY  3/11   Right   . TOTAL KNEE ARTHROPLASTY  05/20/2012   Procedure: TOTAL KNEE ARTHROPLASTY;  Surgeon: Gearlean Alf, MD;  Location: [facility];  Service: Orthopedics;  Laterality: Right;    Social History   Socioeconomic History  . Marital status: Married    Spouse name: Not on file  . Number of children: 2  . Years of education: Not on file  . Highest education level: Not on file  Occupational History  . Occupation: Retired Pharmacist, hospital    Comment: retired    Fish farm manager: RETIRED  Social Needs  . Financial resource strain: Not on file  . Food insecurity    Worry: Not on file    Inability: Not on file  . Transportation needs    Medical: Not on file    Non-medical: Not on file  Tobacco Use  . Smoking status: Former Smoker    Quit date: 09/12/1983    Years since quitting: 35.8  . Smokeless tobacco: Never Used  Substance and Sexual Activity  . Alcohol use: Yes    Alcohol/week: 0.0 standard drinks    Comment: occasional   . Drug use: No  . Sexual activity: Not on file  Lifestyle  . Physical activity    Days per week: Not on file    Minutes per session: Not on file  . Stress: Not on  file  Relationships  . Social Herbalist on phone: Not on file    Gets together: Not on file    Attends religious service: Not on file    Active member of club or organization: Not on file    Attends meetings of clubs or organizations: Not on file    Relationship status: Not on file  . Intimate partner violence    Fear of current or ex partner: Not on file    Emotionally abused: Not on file    Physically abused: Not on file    Forced sexual activity: Not on file  Other Topics Concern  . Not on file  Social History Narrative   Lives w/ husband of 44 years in a 3 story home. Has 2 sons.  Retired Education officer, museum.  Education: college.            Allergies as of 07/21/2019      Reactions   Tramadol Hives, Swelling   Per Pt she can take w/o reactions   Oxycodone-acetaminophen Nausea Only, Other (See Comments)   Headaches      Medication  List       Accurate as of July 21, 2019 11:59 PM. If you have any questions, ask your nurse or doctor.        STOP taking these medications   hydrOXYzine 10 MG tablet Commonly known as: ATARAX/VISTARIL Stopped by: Kathlene November, MD   methimazole 10 MG tablet Commonly known as: TAPAZOLE Stopped by: Kathlene November, MD     TAKE these medications   CALCIUM + D PO Take 600 mg by mouth daily after breakfast.   gabapentin 800 MG tablet Commonly known as: NEURONTIN TAKE 1 TABLET(800 MG) BY MOUTH FOUR TIMES DAILY   HYDROcodone-acetaminophen 5-325 MG tablet Commonly known as: NORCO/VICODIN Take 1 tablet by mouth 2 (two) times daily as needed.   lisinopril-hydrochlorothiazide 20-12.5 MG tablet Commonly known as: ZESTORETIC Take 1 tablet by mouth daily.   LORazepam 0.5 MG tablet Commonly known as: ATIVAN Take 1-2 tablets (0.5-1 mg total) by mouth at bedtime as needed for sleep. What changed: See the new instructions. Changed by: Kathlene November, MD           Objective:   Physical Exam BP 100/67 (BP Location: Left Arm, Patient Position: Sitting, Cuff Size: Small)   Pulse 65   Temp 97.6 F (36.4 C) (Temporal)   Resp 16   Ht 5\' 5"  (1.651 m)   Wt 158 lb 8 oz (71.9 kg)   SpO2 100%   BMI 26.38 kg/m  General:   Well developed, NAD, BMI noted. HEENT:  Normocephalic . Face symmetric, atraumatic Neck: + Thyromegaly, mostly at the right side, the site is enlarged but not nodular or tender.  No unusual lymph nodes or masses in the neck or supraclavicular areas. Lungs:  CTA B Normal respiratory effort, no intercostal retractions, no accessory muscle use. Heart: RRR,  no murmur.  No pretibial edema bilaterally  Skin: Not pale. Not jaundice Neurologic:  alert & oriented X3.  Speech normal, gait appropriate for age and unassisted Psych--  Cognition and judgment appear intact.  Cooperative with normal attention span and concentration.  Behavior appropriate. No anxious or depressed  appearing.      Assessment    Assessment HTN Hyperlipidemia Anxiety, insomnia, ativan rx per pcp Elevated homocysteine Goiter  Thyroid nodule BX 2006 neg, [place] 2013 stable [place] 02-2016 -- bx 03-2016 benign  MSK: sees pain mgmt ---DJD . ---  Postlaminectomy syndrome, back surgery 2013  ---Intolerant to Lyrica, gabapentin helps --hydrocodone rx elsewhere -- Neuropathy: as off 11-2017 has seen 4 different specialists --chronic pain syndrome   Plan: HTN: Continue Zestoretic, check BMP, CBC Hyperlipidemia: Diet controlled, check FLP Anxiety insomnia: Refill Ativan.  She is not taking Atarax.  Did not help much. Goiter, hyperthyroidism: Right side of the gland seems more enlarged than before according to the patient, exam did show prominent thyromegaly, she is not taking Tapazole.  Reports some hoarseness, could be related to goiter.  I refer her back to endocrinology, last visit was 07-2018, stressed the importance of check with them regularly. RTC 4 months CPX

## 2019-07-21 NOTE — Patient Instructions (Addendum)
Please schedule Medicare Wellness with [name].   GO TO THE LAB : Get the blood work     GO TO THE FRONT DESK Schedule your next appointment  For a physical exam in 4 months   Check the  blood pressure 2 times a  Week   BP GOAL is between 110/65 and  135/85. If it is consistently higher or lower, let me know

## 2019-07-22 NOTE — Assessment & Plan Note (Addendum)
HTN: Continue Zestoretic, check BMP, CBC Hyperlipidemia: Diet controlled, check FLP Anxiety insomnia: Refill Ativan.  She is not taking Atarax.  Did not help much. Goiter, hyperthyroidism : Right side of the gland seems more enlarged than before according to the patient, exam did show prominent thyromegaly, she is not taking Tapazole.  Reports some hoarseness, could be related to goiter.  I refer her back to endocrinology, last visit was 07-2018, stressed the importance of check with them regularly. RTC 4 months CPX

## 2019-08-16 DIAGNOSIS — M5136 Other intervertebral disc degeneration, lumbar region: Secondary | ICD-10-CM | POA: Diagnosis not present

## 2019-08-20 ENCOUNTER — Other Ambulatory Visit: Payer: Self-pay | Admitting: Internal Medicine

## 2019-09-09 ENCOUNTER — Encounter: Payer: Self-pay | Admitting: Internal Medicine

## 2019-09-15 ENCOUNTER — Other Ambulatory Visit: Payer: Self-pay | Admitting: Internal Medicine

## 2019-09-25 DIAGNOSIS — M5136 Other intervertebral disc degeneration, lumbar region: Secondary | ICD-10-CM | POA: Diagnosis not present

## 2019-10-10 ENCOUNTER — Telehealth: Payer: Self-pay | Admitting: Internal Medicine

## 2019-10-10 MED ORDER — LISINOPRIL-HYDROCHLOROTHIAZIDE 20-12.5 MG PO TABS: 1.0000 | ORAL_TABLET | Freq: Every day | ORAL | 1 refills | Status: DC

## 2019-10-10 NOTE — Telephone Encounter (Signed)
Medication: lisinopril-hydrochlorothiazide (ZESTORETIC) 20-12.5 MG tablet   Pt recently moved and lost her meds. She is prepared to pay out of pocket  Has the patient contacted their pharmacy? No. (If no, request that the patient contact the pharmacy for the refill.) (If yes, when and what did the pharmacy advise?)  Preferred Pharmacy (with phone number or street name): NEW Pharamacy   [facility]   Agent: Please be advised that RX refills may take up to 3 business days. We ask that you follow-up with your pharmacy.

## 2019-10-10 NOTE — Telephone Encounter (Signed)
Rx sent 

## 2019-10-22 ENCOUNTER — Telehealth: Payer: Self-pay

## 2019-10-22 MED ORDER — HYDROXYZINE HCL 10 MG PO TABS: 10.0000 mg | ORAL_TABLET | Freq: Every evening | ORAL | 3 refills | Status: DC | PRN

## 2019-10-22 NOTE — Telephone Encounter (Signed)
Rx sent 

## 2019-10-22 NOTE — Telephone Encounter (Signed)
Pt called in to see if Dr. Larose Kells could send [facility] for    hydrOXYzine (ATARAX/VISTARIL) 10 MG tablet [id number]   Order Details Dose, Route, Frequency: As Directed  Dispense Quantity: 60 tablet Refills: 2       Sig: TAKE 1 TO 2 TABLETS(10 TO 20 MG) BY MOUTH AT BEDTIME AS NEEDED     Patient will be using this pharmacy for now on.

## 2019-10-28 DIAGNOSIS — Z79891 Long term (current) use of opiate analgesic: Secondary | ICD-10-CM | POA: Diagnosis not present

## 2019-10-28 DIAGNOSIS — Z5181 Encounter for therapeutic drug level monitoring: Secondary | ICD-10-CM | POA: Diagnosis not present

## 2019-10-28 DIAGNOSIS — M47896 Other spondylosis, lumbar region: Secondary | ICD-10-CM | POA: Diagnosis not present

## 2019-10-28 DIAGNOSIS — Z79899 Other long term (current) drug therapy: Secondary | ICD-10-CM | POA: Diagnosis not present

## 2019-10-28 DIAGNOSIS — M5136 Other intervertebral disc degeneration, lumbar region: Secondary | ICD-10-CM | POA: Diagnosis not present

## 2019-11-10 DIAGNOSIS — M545 Low back pain: Secondary | ICD-10-CM | POA: Diagnosis not present

## 2019-11-10 DIAGNOSIS — R262 Difficulty in walking, not elsewhere classified: Secondary | ICD-10-CM | POA: Diagnosis not present

## 2019-11-10 DIAGNOSIS — M25551 Pain in right hip: Secondary | ICD-10-CM | POA: Diagnosis not present

## 2019-11-12 DIAGNOSIS — R262 Difficulty in walking, not elsewhere classified: Secondary | ICD-10-CM | POA: Diagnosis not present

## 2019-11-12 DIAGNOSIS — M545 Low back pain: Secondary | ICD-10-CM | POA: Diagnosis not present

## 2019-11-12 DIAGNOSIS — M25551 Pain in right hip: Secondary | ICD-10-CM | POA: Diagnosis not present

## 2019-11-17 DIAGNOSIS — M545 Low back pain: Secondary | ICD-10-CM | POA: Diagnosis not present

## 2019-11-17 DIAGNOSIS — R262 Difficulty in walking, not elsewhere classified: Secondary | ICD-10-CM | POA: Diagnosis not present

## 2019-11-17 DIAGNOSIS — M25551 Pain in right hip: Secondary | ICD-10-CM | POA: Diagnosis not present

## 2019-11-18 ENCOUNTER — Other Ambulatory Visit: Payer: Self-pay

## 2019-11-18 ENCOUNTER — Encounter: Payer: Self-pay | Admitting: Internal Medicine

## 2019-11-18 ENCOUNTER — Ambulatory Visit (INDEPENDENT_AMBULATORY_CARE_PROVIDER_SITE_OTHER): Payer: Medicare Other | Admitting: Internal Medicine

## 2019-11-18 VITALS — BP 146/92 | HR 52 | Temp 95.4°F | Resp 16 | Ht 65.0 in | Wt 161.4 lb

## 2019-11-18 DIAGNOSIS — G4700 Insomnia, unspecified: Secondary | ICD-10-CM

## 2019-11-18 DIAGNOSIS — I10 Essential (primary) hypertension: Secondary | ICD-10-CM | POA: Diagnosis not present

## 2019-11-18 DIAGNOSIS — R6889 Other general symptoms and signs: Secondary | ICD-10-CM | POA: Diagnosis not present

## 2019-11-18 DIAGNOSIS — F411 Generalized anxiety disorder: Secondary | ICD-10-CM

## 2019-11-18 DIAGNOSIS — E041 Nontoxic single thyroid nodule: Secondary | ICD-10-CM | POA: Diagnosis not present

## 2019-11-18 LAB — BASIC METABOLIC PANEL
BUN: 27 mg/dL — ABNORMAL HIGH (ref 6–23)
CO2: 31 mEq/L (ref 19–32)
Calcium: 9.5 mg/dL (ref 8.4–10.5)
Chloride: 100 mEq/L (ref 96–112)
Creatinine, Ser: 1.03 mg/dL (ref 0.40–1.20)
GFR: 51.7 mL/min — ABNORMAL LOW (ref >60.00)
Glucose, Bld: 83 mg/dL (ref 70–99)
Potassium: 4.7 mEq/L (ref 3.5–5.1)
Sodium: 138 mEq/L (ref 135–145)

## 2019-11-18 MED ORDER — LORAZEPAM 0.5 MG PO TABS: 0.5000 mg | ORAL_TABLET | Freq: Every evening | ORAL | 2 refills | Status: DC | PRN

## 2019-11-18 NOTE — Patient Instructions (Addendum)
Please schedule Medicare Wellness with Glenard Haring.   GO TO THE LAB : Get the blood work     [place] back for a check up in 4 months, please make an appointment   Your blood pressure is elevated today, please check your blood pressure twice a week for few weeks.  BP GOAL is between 110/65 and  135/85. If it is consistently higher or lower, let me know

## 2019-11-18 NOTE — Progress Notes (Signed)
Subjective:    Patient ID: [name], female    DOB: 13-Sep-1940, 79 y.o.   MRN: [id number]  DOS:  11/18/2019 Type of visit - description: Routine checkup We talk about her thyroid nodule, hypertension, insomnia she is also concerned about dementia.  Her BP today is elevated, she forgot to take her medication today, ambulatory BPs always normal per patient.  Dementia?  She is often forgetful but she is fully functional: Driving, paying her bills, lives independently.  Denies confusion.  Insomnia: Was unable to get lorazepam and is taking hydroxyzine without much help.   BP Readings from Last 3 Encounters:  11/18/19 (!) 146/92  07/21/19 100/67  11/04/18 124/68     Review of Systems See above   Past Medical History:  Diagnosis Date  . Adenomatous colon polyp 03/2003  . Anxiety    ativan for sleep  . Baker's cyst   . Degenerative lumbar disc    Dr. Nelva Bush, epidural injections  . Goiter   . History of elevated homocysteine   . Hypertension 09/11/2005  . Lumbar radiculopathy    Dr. Nelva Bush, epidureal injections  . Lumbosacral radiculopathy    seeing Pain Management  . OA (osteoarthritis)     knee pain after TKR left , back pain- last steroid injection back 2 months ago Dr Nelva Bush  . PONV (postoperative nausea and vomiting)   . Postlaminectomy syndrome, lumbar region    Dr. Nelva Bush, recommended doing bilateral S1 transforaminal epidural with D5W  . Thyroid nodule    rt benign colloid nodule--left hyperplastic nodule- Thyroid bx 2006: neg    Past Surgical History:  Procedure Laterality Date  . ABDOMINAL HYSTERECTOMY    . BACK SURGERY  11-09-2011   at [facility], lumbar  . bakers cystectomy     right knee  . FACELIFT    . INCISIONAL HERNIA REPAIR    . KNEE ARTHROSCOPY     L   replacement 2007, Dr Corinne Ports, then a manipulation, then redo TKR 11/09 w/ Dr.Alusio. Parcial replacement again 09-2010  . KNEE SURGERY     x 5-  left  . OOPHORECTOMY    . SHOULDER SURGERY      replacements: L 2010, R 2012  . TOTAL HIP ARTHROPLASTY  3/11   Right   . TOTAL KNEE ARTHROPLASTY  05/20/2012   Procedure: TOTAL KNEE ARTHROPLASTY;  Surgeon: Gearlean Alf, MD;  Location: [facility];  Service: Orthopedics;  Laterality: Right;    Allergies as of 11/18/2019      Reactions   Tramadol Hives, Swelling   Per Pt she can take w/o reactions   Oxycodone-acetaminophen Nausea Only, Other (See Comments)   Headaches      Medication List       Accurate as of November 18, 2019 11:59 PM. If you have any questions, ask your nurse or doctor.        STOP taking these medications   hydrOXYzine 10 MG tablet Commonly known as: ATARAX/VISTARIL Stopped by: Kathlene November, MD     TAKE these medications   CALCIUM + D PO Take 600 mg by mouth daily after breakfast.   gabapentin 800 MG tablet Commonly known as: NEURONTIN TAKE 1 TABLET(800 MG) BY MOUTH FOUR TIMES DAILY   HYDROcodone-acetaminophen 5-325 MG tablet Commonly known as: NORCO/VICODIN Take 1 tablet by mouth 2 (two) times daily as needed.   lisinopril-hydrochlorothiazide 20-12.5 MG tablet Commonly known as: ZESTORETIC Take 1 tablet by mouth daily.   LORazepam 0.5 MG tablet Commonly  known as: ATIVAN Take 1-2 tablets (0.5-1 mg total) by mouth at bedtime as needed for sleep.             Objective:   Physical Exam BP (!) 146/92 (BP Location: Left Arm)   Pulse (!) 52   Temp (!) 95.4 F (35.2 C) (Temporal)   Resp 16   Ht 5\' 5"  (1.651 m)   Wt 161 lb 6 oz (73.2 kg)   SpO2 100%   BMI 26.85 kg/m  General:   Well developed, NAD, BMI noted.  HEENT:  Normocephalic . Face symmetric, atraumatic Neck: + Right thyroid nodule, not tender Lungs:  CTA B Normal respiratory effort, no intercostal retractions, no accessory muscle use. Heart: RRR,  no murmur.  Abdomen:  Not distended, soft, non-tender. No rebound or rigidity.   Skin: Not pale. Not jaundice Lower extremities: no pretibial edema bilaterally  Neurologic:  alert &  oriented X3.  Speech normal, gait appropriate for age and unassisted Psych--  Cognition and judgment appear intact.  Cooperative with normal attention span and concentration.  Behavior appropriate. No anxious or depressed appearing.     Assessment     Assessment HTN Hyperlipidemia Anxiety, insomnia, ativan rx per pcp Elevated homocysteine Goiter  Thyroid nodule BX 2006 neg, [place] 2013 stable [place] 02-2016 -- bx 03-2016 benign  MSK: sees pain mgmt ---DJD . ---Postlaminectomy syndrome, back surgery 2013  ---Intolerant to Lyrica, gabapentin helps --hydrocodone rx elsewhere -- Neuropathy: as off 11-2017 has seen 4 different specialists --chronic pain syndrome   PLAN HTN: On Zestoretic, ambulatory BPs typically normal, BP today upon arrival elevated, recheck 146/92.  Recommend to check her BPs at home twice a week for few weeks.  Check a BMP Hyperlipidemia: Diet controlled, last results satisfactory Anxiety, insomnia: Refill Ativan. Dementia?  Patient is concerned about the diagnosis, she does not exhibit signs or symptoms of dementia, offered to come back another day and do a MMSE and also check some additional labs, she declines Thyroid nodule: Since the last visit did not see Endo, refer back to Dr. Buddy Duty Preventive care reviewed. RTC 4 months  This visit occurred during the SARS-CoV-2 public health emergency.  Safety protocols were in place, including screening questions prior to the visit, additional usage of staff PPE, and extensive cleaning of exam room while observing appropriate contact time as indicated for disinfecting solutions.

## 2019-11-18 NOTE — Progress Notes (Signed)
Pre visit review using our clinic review tool, if applicable. No additional management support is needed unless otherwise documented below in the visit note. 

## 2019-11-19 NOTE — Assessment & Plan Note (Signed)
HTN: On Zestoretic, ambulatory BPs typically normal, BP today upon arrival elevated, recheck 146/92.  Recommend to check her BPs at home twice a week for few weeks.  Check a BMP Hyperlipidemia: Diet controlled, last results satisfactory Anxiety, insomnia: Refill Ativan. Dementia?  Patient is concerned about the diagnosis, she does not exhibit signs or symptoms of dementia, offered to come back another day and do a MMSE and also check some additional labs, she declines Thyroid nodule: Since the last visit did not see Endo, refer back to Dr. Buddy Duty Preventive care reviewed. RTC 4 months

## 2019-11-19 NOTE — Assessment & Plan Note (Signed)
-  Td 2013 -PNM 23:  3th injection was 2019 -PNM 13: 06-2019. -Zostavax 2008 -Shingrix discussed for, I do not believe she has proceeded -S/p Covid immunization x2 -Last mammogram 2018, she is really not interested in further breast cancer screening -DEXA (-) 2017, no history of fractures, repeat q 5 years -[facility]: Due for a colonoscopy, last 2 colonoscopies negative, see entry from 11/04/2018, elected not further rescreening

## 2019-11-20 DIAGNOSIS — M25551 Pain in right hip: Secondary | ICD-10-CM | POA: Diagnosis not present

## 2019-11-20 DIAGNOSIS — M545 Low back pain: Secondary | ICD-10-CM | POA: Diagnosis not present

## 2019-11-20 DIAGNOSIS — R262 Difficulty in walking, not elsewhere classified: Secondary | ICD-10-CM | POA: Diagnosis not present

## 2019-11-24 DIAGNOSIS — M25551 Pain in right hip: Secondary | ICD-10-CM | POA: Diagnosis not present

## 2019-11-24 DIAGNOSIS — M545 Low back pain: Secondary | ICD-10-CM | POA: Diagnosis not present

## 2019-11-24 DIAGNOSIS — R262 Difficulty in walking, not elsewhere classified: Secondary | ICD-10-CM | POA: Diagnosis not present

## 2019-11-26 DIAGNOSIS — M545 Low back pain: Secondary | ICD-10-CM | POA: Diagnosis not present

## 2019-11-26 DIAGNOSIS — R262 Difficulty in walking, not elsewhere classified: Secondary | ICD-10-CM | POA: Diagnosis not present

## 2019-11-26 DIAGNOSIS — M25551 Pain in right hip: Secondary | ICD-10-CM | POA: Diagnosis not present

## 2019-12-01 DIAGNOSIS — M25551 Pain in right hip: Secondary | ICD-10-CM | POA: Diagnosis not present

## 2019-12-01 DIAGNOSIS — M545 Low back pain: Secondary | ICD-10-CM | POA: Diagnosis not present

## 2019-12-01 DIAGNOSIS — R262 Difficulty in walking, not elsewhere classified: Secondary | ICD-10-CM | POA: Diagnosis not present

## 2019-12-03 DIAGNOSIS — M25551 Pain in right hip: Secondary | ICD-10-CM | POA: Diagnosis not present

## 2019-12-03 DIAGNOSIS — M545 Low back pain: Secondary | ICD-10-CM | POA: Diagnosis not present

## 2019-12-03 DIAGNOSIS — R262 Difficulty in walking, not elsewhere classified: Secondary | ICD-10-CM | POA: Diagnosis not present

## 2019-12-23 DIAGNOSIS — M25551 Pain in right hip: Secondary | ICD-10-CM | POA: Diagnosis not present

## 2019-12-23 DIAGNOSIS — R262 Difficulty in walking, not elsewhere classified: Secondary | ICD-10-CM | POA: Diagnosis not present

## 2019-12-23 DIAGNOSIS — M545 Low back pain: Secondary | ICD-10-CM | POA: Diagnosis not present

## 2019-12-29 DIAGNOSIS — M545 Low back pain: Secondary | ICD-10-CM | POA: Diagnosis not present

## 2019-12-29 DIAGNOSIS — M25551 Pain in right hip: Secondary | ICD-10-CM | POA: Diagnosis not present

## 2019-12-29 DIAGNOSIS — R262 Difficulty in walking, not elsewhere classified: Secondary | ICD-10-CM | POA: Diagnosis not present

## 2020-01-01 DIAGNOSIS — Z5181 Encounter for therapeutic drug level monitoring: Secondary | ICD-10-CM | POA: Diagnosis not present

## 2020-01-01 DIAGNOSIS — E042 Nontoxic multinodular goiter: Secondary | ICD-10-CM | POA: Diagnosis not present

## 2020-01-01 DIAGNOSIS — E0500 Thyrotoxicosis with diffuse goiter without thyrotoxic crisis or storm: Secondary | ICD-10-CM | POA: Diagnosis not present

## 2020-01-01 DIAGNOSIS — E0590 Thyrotoxicosis, unspecified without thyrotoxic crisis or storm: Secondary | ICD-10-CM | POA: Diagnosis not present

## 2020-01-01 DIAGNOSIS — R490 Dysphonia: Secondary | ICD-10-CM | POA: Diagnosis not present

## 2020-01-01 LAB — TSH: TSH: 0.69 (ref <=5.90)

## 2020-01-08 ENCOUNTER — Other Ambulatory Visit: Payer: Self-pay | Admitting: Internal Medicine

## 2020-01-08 DIAGNOSIS — E0500 Thyrotoxicosis with diffuse goiter without thyrotoxic crisis or storm: Secondary | ICD-10-CM

## 2020-01-08 DIAGNOSIS — E042 Nontoxic multinodular goiter: Secondary | ICD-10-CM

## 2020-01-08 DIAGNOSIS — E0590 Thyrotoxicosis, unspecified without thyrotoxic crisis or storm: Secondary | ICD-10-CM

## 2020-01-14 ENCOUNTER — Encounter: Payer: Self-pay | Admitting: Internal Medicine

## 2020-01-15 ENCOUNTER — Ambulatory Visit
Admission: RE | Admit: 2020-01-15 | Discharge: 2020-01-15 | Disposition: A | Discharge status: Home / Self Care | Payer: Medicare Other | Source: Ambulatory Visit | Attending: Internal Medicine | Admitting: Internal Medicine

## 2020-01-15 DIAGNOSIS — E0500 Thyrotoxicosis with diffuse goiter without thyrotoxic crisis or storm: Secondary | ICD-10-CM

## 2020-01-15 DIAGNOSIS — E042 Nontoxic multinodular goiter: Secondary | ICD-10-CM

## 2020-01-15 DIAGNOSIS — E041 Nontoxic single thyroid nodule: Secondary | ICD-10-CM | POA: Diagnosis not present

## 2020-01-15 DIAGNOSIS — E0590 Thyrotoxicosis, unspecified without thyrotoxic crisis or storm: Secondary | ICD-10-CM

## 2020-01-30 DIAGNOSIS — H5212 Myopia, left eye: Secondary | ICD-10-CM | POA: Diagnosis not present

## 2020-01-30 DIAGNOSIS — H5310 Unspecified subjective visual disturbances: Secondary | ICD-10-CM | POA: Diagnosis not present

## 2020-01-30 DIAGNOSIS — H5201 Hypermetropia, right eye: Secondary | ICD-10-CM | POA: Diagnosis not present

## 2020-02-06 DIAGNOSIS — M25562 Pain in left knee: Secondary | ICD-10-CM | POA: Diagnosis not present

## 2020-02-11 DIAGNOSIS — M25562 Pain in left knee: Secondary | ICD-10-CM | POA: Diagnosis not present

## 2020-02-17 DIAGNOSIS — M25562 Pain in left knee: Secondary | ICD-10-CM | POA: Diagnosis not present

## 2020-02-18 ENCOUNTER — Other Ambulatory Visit (HOSPITAL_COMMUNITY): Payer: Self-pay | Admitting: Chiropractic Medicine

## 2020-02-18 ENCOUNTER — Other Ambulatory Visit: Payer: Self-pay | Admitting: Chiropractic Medicine

## 2020-02-18 DIAGNOSIS — M25562 Pain in left knee: Secondary | ICD-10-CM

## 2020-02-25 ENCOUNTER — Other Ambulatory Visit: Payer: Self-pay

## 2020-02-25 ENCOUNTER — Ambulatory Visit (HOSPITAL_COMMUNITY)
Admission: RE | Admit: 2020-02-25 | Discharge: 2020-02-25 | Disposition: A | Discharge status: Home / Self Care | Payer: Medicare Other | Source: Ambulatory Visit | Attending: Chiropractic Medicine | Admitting: Chiropractic Medicine

## 2020-02-25 DIAGNOSIS — M25562 Pain in left knee: Secondary | ICD-10-CM

## 2020-02-25 MED ORDER — TECHNETIUM TC 99M MEDRONATE IV KIT
20.0000 | PACK | Freq: Once | INTRAVENOUS | Status: AC | PRN
  Administered 2020-02-25: 20 via INTRAVENOUS

## 2020-03-23 ENCOUNTER — Ambulatory Visit: Payer: Medicare Other | Admitting: Internal Medicine

## 2020-03-24 ENCOUNTER — Encounter: Payer: Self-pay | Admitting: Internal Medicine

## 2020-03-24 ENCOUNTER — Ambulatory Visit (INDEPENDENT_AMBULATORY_CARE_PROVIDER_SITE_OTHER): Payer: Medicare Other | Admitting: Internal Medicine

## 2020-03-24 ENCOUNTER — Other Ambulatory Visit: Payer: Self-pay

## 2020-03-24 VITALS — BP 116/72 | HR 60 | Temp 98.2°F | Resp 18 | Ht 65.0 in | Wt 166.4 lb

## 2020-03-24 DIAGNOSIS — F411 Generalized anxiety disorder: Secondary | ICD-10-CM

## 2020-03-24 DIAGNOSIS — G4700 Insomnia, unspecified: Secondary | ICD-10-CM | POA: Diagnosis not present

## 2020-03-24 DIAGNOSIS — I10 Essential (primary) hypertension: Secondary | ICD-10-CM | POA: Diagnosis not present

## 2020-03-24 DIAGNOSIS — E0500 Thyrotoxicosis with diffuse goiter without thyrotoxic crisis or storm: Secondary | ICD-10-CM | POA: Diagnosis not present

## 2020-03-24 MED ORDER — LORAZEPAM 0.5 MG PO TABS: 0.5000 mg | ORAL_TABLET | Freq: Every evening | ORAL | 2 refills | Status: DC | PRN

## 2020-03-24 MED ORDER — HYDROXYZINE HCL 10 MG PO TABS: 10.0000 mg | ORAL_TABLET | Freq: Every evening | ORAL | 5 refills | Status: DC | PRN

## 2020-03-24 NOTE — Patient Instructions (Addendum)
    GO TO THE FRONT DESK, PLEASE SCHEDULE YOUR APPOINTMENTS Come back for  a physical exam by 11-2020 

## 2020-03-24 NOTE — Progress Notes (Signed)
Subjective:    Patient ID: [name], female    DOB: 1940-12-19, 79 y.o.   MRN: [id number]  DOS:  03/24/2020 Type of visit - description: Follow-up Doing okay, no new concerns. No ambulatory BPs   Review of Systems Denies chest pain no difficulty breathing No nausea, vomiting, diarrhea Neuropathy symptoms at baseline  Past Medical History:  Diagnosis Date  . Adenomatous colon polyp 03/2003  . Anxiety    ativan for sleep  . Baker's cyst   . Degenerative lumbar disc    Dr. Nelva Bush, epidural injections  . Goiter   . History of elevated homocysteine   . Hypertension 09/11/2005  . Lumbar radiculopathy    Dr. Nelva Bush, epidureal injections  . Lumbosacral radiculopathy    seeing Pain Management  . OA (osteoarthritis)     knee pain after TKR left , back pain- last steroid injection back 2 months ago Dr Nelva Bush  . PONV (postoperative nausea and vomiting)   . Postlaminectomy syndrome, lumbar region    Dr. Nelva Bush, recommended doing bilateral S1 transforaminal epidural with D5W  . Thyroid nodule    rt benign colloid nodule--left hyperplastic nodule- Thyroid bx 2006: neg    Past Surgical History:  Procedure Laterality Date  . ABDOMINAL HYSTERECTOMY    . BACK SURGERY  11-09-2011   at [facility], lumbar  . bakers cystectomy     right knee  . FACELIFT    . INCISIONAL HERNIA REPAIR    . KNEE ARTHROSCOPY     L   replacement 2007, Dr Corinne Ports, then a manipulation, then redo TKR 11/09 w/ Dr.Alusio. Parcial replacement again 09-2010  . KNEE SURGERY     x 5-  left  . OOPHORECTOMY    . SHOULDER SURGERY     replacements: L 2010, R 2012  . TOTAL HIP ARTHROPLASTY  3/11   Right   . TOTAL KNEE ARTHROPLASTY  05/20/2012   Procedure: TOTAL KNEE ARTHROPLASTY;  Surgeon: Gearlean Alf, MD;  Location: [facility];  Service: Orthopedics;  Laterality: Right;    Allergies as of 03/24/2020      Reactions   Tramadol Hives, Swelling   Per Pt she can take w/o reactions   Oxycodone-acetaminophen Nausea Only, Other  (See Comments)   Headaches      Medication List       Accurate as of March 24, 2020 11:59 PM. If you have any questions, ask your nurse or doctor.        CALCIUM + D PO Take 600 mg by mouth daily after breakfast.   gabapentin 800 MG tablet Commonly known as: NEURONTIN TAKE 1 TABLET(800 MG) BY MOUTH FOUR TIMES DAILY   HYDROcodone-acetaminophen 5-325 MG tablet Commonly known as: NORCO/VICODIN Take 1 tablet by mouth 2 (two) times daily as needed.   hydrOXYzine 10 MG tablet Commonly known as: ATARAX/VISTARIL Take 1-2 tablets (10-20 mg total) by mouth at bedtime as needed. Started by: Kathlene November, MD   lisinopril-hydrochlorothiazide 20-12.5 MG tablet Commonly known as: ZESTORETIC Take 1 tablet by mouth daily.   LORazepam 0.5 MG tablet Commonly known as: ATIVAN Take 1-2 tablets (0.5-1 mg total) by mouth at bedtime as needed for sleep.          Objective:   Physical Exam BP 116/72 (BP Location: Left Arm, Patient Position: Sitting, Cuff Size: Small)   Pulse 60   Temp 98.2 F (36.8 C) (Oral)   Resp 18   Ht 5\' 5"  (1.651 m)   Wt 166 lb  6 oz (75.5 kg)   SpO2 99%   BMI 27.69 kg/m   General:   Well developed, NAD, BMI noted. HEENT:  Normocephalic . Face symmetric, atraumatic Lungs:  CTA B Normal respiratory effort, no intercostal retractions, no accessory muscle use. Heart: RRR,  no murmur.  Lower extremities: no pretibial edema bilaterally  Skin: Not pale. Not jaundice Neurologic:  alert & oriented X3.  Speech normal, gait unassisted, somewhat limited by DJD. Psych--  Cognition and judgment appear intact.  Cooperative with normal attention span and concentration.  Behavior appropriate. No anxious or depressed appearing.      Assessment    Assessment HTN Hyperlipidemia Anxiety, insomnia, ativan rx per pcp Elevated homocysteine Goiter  -Thyroid nodule BX 2006 neg, [place] 2013 stable,US 02-2016 -- bx 03-2016 benign -DX Graves' disease 2021 [facility]: sees pain  mgmt ---DJD . ---Postlaminectomy syndrome, back surgery 2013  ---Intolerant to Lyrica, gabapentin helps --hydrocodone rx elsewhere -- Neuropathy: as off 11-2017 has seen 4 different specialists --chronic pain syndrome   PLAN HTN: Seems controlled on Zestoretic, last BMP satisfactory, no change Anxiety, insomnia: Request a refill on Ativan and Atarax, sent, PDMP okay. Thyroid nodule/Graves' disease: Saw Endo 12-2019 note reviewed RTC 11-2020 CPX.  Call sooner if needed    This visit occurred during the SARS-CoV-2 public health emergency.  Safety protocols were in place, including screening questions prior to the visit, additional usage of staff PPE, and extensive cleaning of exam room while observing appropriate contact time as indicated for disinfecting solutions.

## 2020-03-24 NOTE — Progress Notes (Signed)
Pre visit review using our clinic review tool, if applicable. No additional management support is needed unless otherwise documented below in the visit note. 

## 2020-03-25 ENCOUNTER — Telehealth: Payer: Self-pay | Admitting: Internal Medicine

## 2020-03-25 NOTE — Telephone Encounter (Signed)
Advise patient,  -she could return to this office to do an MMSE. -Alternatively, she could be referred to a psychologist for a more detailed memory examination. -There is no blood test or screening brain MRI for the diagnosis of Alzheimer.

## 2020-03-25 NOTE — Telephone Encounter (Signed)
Caller Ashika Apuzzo  Call Back # [phone]  Patient states that she would like testing for Alzheimers.   Please Advise how patient can get this testing.

## 2020-03-25 NOTE — Telephone Encounter (Signed)
Dr Larose Kells -- does pt need to return for focused visit for memory concern or is there another way you do memory testing?

## 2020-03-26 NOTE — Telephone Encounter (Signed)
LMOM informing Pt of recommendations. Instructed to call if she is interested in scheduling.

## 2020-03-27 DIAGNOSIS — E0500 Thyrotoxicosis with diffuse goiter without thyrotoxic crisis or storm: Secondary | ICD-10-CM | POA: Insufficient documentation

## 2020-03-27 NOTE — Assessment & Plan Note (Signed)
HTN: Seems controlled on Zestoretic, last BMP satisfactory, no change Anxiety, insomnia: Request a refill on Ativan and Atarax, sent, PDMP okay. Thyroid nodule/Graves' disease: Saw Endo 12-2019 note reviewed RTC 11-2020 CPX.  Call sooner if needed

## 2020-03-30 ENCOUNTER — Telehealth: Payer: Self-pay | Admitting: Internal Medicine

## 2020-03-30 NOTE — Telephone Encounter (Signed)
Needs appt- see last telephone note.

## 2020-03-30 NOTE — Telephone Encounter (Signed)
  [name]  [phone]  Patient states that she needs something for her memory.  Patient states in recent visits she has spoken with Dr. Larose Kells about different medication to improve memory on market.   Please Advise

## 2020-04-08 DIAGNOSIS — M25562 Pain in left knee: Secondary | ICD-10-CM | POA: Diagnosis not present

## 2020-04-08 DIAGNOSIS — Z96659 Presence of unspecified artificial knee joint: Secondary | ICD-10-CM | POA: Diagnosis not present

## 2020-04-19 ENCOUNTER — Encounter: Payer: Self-pay | Admitting: Internal Medicine

## 2020-04-19 ENCOUNTER — Ambulatory Visit (INDEPENDENT_AMBULATORY_CARE_PROVIDER_SITE_OTHER): Payer: Medicare Other | Admitting: Internal Medicine

## 2020-04-19 ENCOUNTER — Other Ambulatory Visit: Payer: Self-pay

## 2020-04-19 VITALS — BP 132/82 | HR 62 | Temp 98.4°F | Resp 12 | Ht 65.0 in | Wt 165.0 lb

## 2020-04-19 DIAGNOSIS — F411 Generalized anxiety disorder: Secondary | ICD-10-CM

## 2020-04-19 DIAGNOSIS — M8949 Other hypertrophic osteoarthropathy, multiple sites: Secondary | ICD-10-CM | POA: Diagnosis not present

## 2020-04-19 DIAGNOSIS — J069 Acute upper respiratory infection, unspecified: Secondary | ICD-10-CM

## 2020-04-19 DIAGNOSIS — Z79899 Other long term (current) drug therapy: Secondary | ICD-10-CM | POA: Diagnosis not present

## 2020-04-19 DIAGNOSIS — M159 Polyosteoarthritis, unspecified: Secondary | ICD-10-CM

## 2020-04-19 NOTE — Assessment & Plan Note (Signed)
Dementia?: Daughter-in-law thinks she has some degree of dementia , MMSE today is 64, pt lives by herself, drives, pays all her bills.  No evidence of dementia, she is doing very well. Anxiety, insomnia, UDS today.  On Ativan URI: As described above, we agreed on conservative treatment including a low-dose of sudafed which she is already taking and tolerating well. Call if no better, see AVS

## 2020-04-19 NOTE — Progress Notes (Signed)
Subjective:    Patient ID: [name], female    DOB: [birth year], 79 y.o.   MRN: [id number]  DOS:  04/19/2020 Type of visit - description: Acute The patient's daughter-in-law thinks that she has a memory issue. The patient does not feel the same way but likes to be checked.  Having a head cold for the last 4 days: Nose congestion and pressure.  Postnasal dripping. Denies fever chills No myalgias No nausea or vomiting No cough or sputum production.   Review of Systems Denies anxiety or depression  Past Medical History:  Diagnosis Date  . Adenomatous colon polyp 03/2003  . Anxiety    ativan for sleep  . Baker's cyst   . Degenerative lumbar disc    Dr. Nelva Bush, epidural injections  . Goiter   . History of elevated homocysteine   . Hypertension 09/11/2005  . Lumbar radiculopathy    Dr. Nelva Bush, epidureal injections  . Lumbosacral radiculopathy    seeing Pain Management  . OA (osteoarthritis)     knee pain after TKR left , back pain- last steroid injection back 2 months ago Dr Nelva Bush  . PONV (postoperative nausea and vomiting)   . Postlaminectomy syndrome, lumbar region    Dr. Nelva Bush, recommended doing bilateral S1 transforaminal epidural with D5W  . Thyroid nodule    rt benign colloid nodule--left hyperplastic nodule- Thyroid bx 2006: neg    Past Surgical History:  Procedure Laterality Date  . ABDOMINAL HYSTERECTOMY    . BACK SURGERY  11-09-2011   at [facility], lumbar  . bakers cystectomy     right knee  . FACELIFT    . INCISIONAL HERNIA REPAIR    . KNEE ARTHROSCOPY     L   replacement 2007, Dr Corinne Ports, then a manipulation, then redo TKR 11/09 w/ Dr.Alusio. Parcial replacement again 09-2010  . KNEE SURGERY     x 5-  left  . OOPHORECTOMY    . SHOULDER SURGERY     replacements: L 2010, R 2012  . TOTAL HIP ARTHROPLASTY  3/11   Right   . TOTAL KNEE ARTHROPLASTY  05/20/2012   Procedure: TOTAL KNEE ARTHROPLASTY;  Surgeon: Gearlean Alf, MD;  Location: [facility];  Service:  Orthopedics;  Laterality: Right;    Allergies as of 04/19/2020      Reactions   Tramadol Hives, Swelling   Per Pt she can take w/o reactions   Oxycodone-acetaminophen Nausea Only, Other (See Comments)   Headaches      Medication List       Accurate as of April 19, 2020 10:36 AM. If you have any questions, ask your nurse or doctor.        CALCIUM + D PO Take 600 mg by mouth daily after breakfast.   gabapentin 800 MG tablet Commonly known as: NEURONTIN TAKE 1 TABLET(800 MG) BY MOUTH FOUR TIMES DAILY   HYDROcodone-acetaminophen 5-325 MG tablet Commonly known as: NORCO/VICODIN Take 1 tablet by mouth 2 (two) times daily as needed.   hydrOXYzine 10 MG tablet Commonly known as: ATARAX/VISTARIL Take 1-2 tablets (10-20 mg total) by mouth at bedtime as needed.   lisinopril-hydrochlorothiazide 20-12.5 MG tablet Commonly known as: ZESTORETIC Take 1 tablet by mouth daily.   LORazepam 0.5 MG tablet Commonly known as: ATIVAN Take 1-2 tablets (0.5-1 mg total) by mouth at bedtime as needed for sleep.          Objective:   Physical Exam BP 132/82 (BP Location: Right Wrist, Cuff Size: Normal)  Pulse 62   SpO2 98%  General:   Well developed, NAD, BMI noted. HEENT:  Normocephalic . Face symmetric, atraumatic. TMs normal, throat symmetric, nose mildly congested, sinuses no TTP Lungs:  CTA B Normal respiratory effort, no intercostal retractions, no accessory muscle use. Heart: RRR,  no murmur.  Lower extremities: no pretibial edema bilaterally  Skin: Not pale. Not jaundice Neurologic:  alert & oriented X3.  MMSE: 28 Speech normal, gait appropriate for age and unassisted Psych--  Cognition and judgment appear intact.  Cooperative with normal attention span and concentration.  Behavior appropriate. No anxious or depressed appearing.      Assessment     Assessment HTN Hyperlipidemia Anxiety, insomnia, ativan rx per pcp Elevated homocysteine Goiter  -Thyroid nodule  BX 2006 neg, [place] 2013 stable,US 02-2016 -- bx 03-2016 benign -DX Graves' disease 2021 [facility]: sees pain mgmt ---DJD . ---Postlaminectomy syndrome, back surgery 2013  ---Intolerant to Lyrica, gabapentin helps --hydrocodone rx elsewhere -- Neuropathy: as off 11-2017 has seen 4 different specialists --chronic pain syndrome   PLAN Dementia?: Daughter-in-law thinks she has some degree of dementia , MMSE today is 16, pt lives by herself, drives, pays all her bills.  No evidence of dementia, she is doing very well. Anxiety, insomnia, UDS today.  On Ativan URI: As described above, we agreed on conservative treatment including a low-dose of sudafed which she is already taking and tolerating well. Call if no better, see AVS    This visit occurred during the SARS-CoV-2 public health emergency.  Safety protocols were in place, including screening questions prior to the visit, additional usage of staff PPE, and extensive cleaning of exam room while observing appropriate contact time as indicated for disinfecting solutions.

## 2020-04-19 NOTE — Patient Instructions (Addendum)
Rest, fluids , tylenol  For cough:  Take Mucinex DM twice a day as needed until better  For nasal congestion: Use OTC Nasocort or Flonase : 2 nasal sprays on each side of the nose in the morning until you feel better    Minimize the use of  pseudoephedrine ok 30 mg  take one tablet 2 or 3  times a day as needed for congestion  Take an antihistamine daily   Call anytime if the symptoms are severe, you have high fever, short of breath, chest pain  We did a detailed mental exam today, your memory is very good    Provide a urine sample

## 2020-04-20 ENCOUNTER — Telehealth: Payer: Self-pay | Admitting: Internal Medicine

## 2020-04-21 LAB — DRUG MONITORING, PANEL 8 WITH CONFIRMATION, URINE
6 Acetylmorphine: NEGATIVE ng/mL (ref <10)
Alcohol Metabolites: POSITIVE ng/mL — AB
Amphetamines: NEGATIVE ng/mL (ref <500)
Benzodiazepines: NEGATIVE ng/mL (ref <100)
Buprenorphine, Urine: NEGATIVE ng/mL (ref <5)
Cocaine Metabolite: NEGATIVE ng/mL (ref <150)
Codeine: NEGATIVE ng/mL (ref <50)
Creatinine: 60.8 mg/dL
Ethyl Glucuronide (ETG): 6361 ng/mL — ABNORMAL HIGH (ref <500)
Ethyl Sulfate (ETS): 3279 ng/mL — ABNORMAL HIGH (ref <100)
Hydrocodone: 56 ng/mL — ABNORMAL HIGH (ref <50)
Hydromorphone: 102 ng/mL — ABNORMAL HIGH (ref <50)
MDMA: NEGATIVE ng/mL (ref <500)
Marijuana Metabolite: NEGATIVE ng/mL (ref <20)
Morphine: NEGATIVE ng/mL (ref <50)
Norhydrocodone: 408 ng/mL — ABNORMAL HIGH (ref <50)
Opiates: POSITIVE ng/mL — AB (ref <100)
Oxidant: NEGATIVE ug/mL
Oxycodone: NEGATIVE ng/mL (ref <100)
pH: 5 (ref 4.5–9.0)

## 2020-04-21 LAB — DM TEMPLATE

## 2020-04-23 ENCOUNTER — Telehealth: Payer: Self-pay | Admitting: Internal Medicine

## 2020-04-23 MED ORDER — PSEUDOEPHEDRINE HCL 30 MG PO TABS: 30.0000 mg | ORAL_TABLET | Freq: Three times a day (TID) | ORAL | 0 refills | Status: DC | PRN

## 2020-04-23 MED ORDER — AZELASTINE HCL 0.1 % NA SOLN
2.0000 | Freq: Every evening | NASAL | 3 refills | Status: DC | PRN
Start: 2020-04-23 — End: 2020-07-16

## 2020-04-23 NOTE — Telephone Encounter (Signed)
Please call the patient, let her know: - I sent a low dose of Sudafed 30 mg every 8 hours as needed only to be taken for few days. -I  also sent a prescription for Astelin, a nose a spray to be used at night.  -If not improving gradually: Get Covid tested at her local pharmacy and let me know.

## 2020-04-23 NOTE — Telephone Encounter (Signed)
Pt.notified

## 2020-04-23 NOTE — Telephone Encounter (Signed)
Caller: Donnajean  Call Back # [phone]  Patient states that she is experiencing a terrible head cold, pt states it has turned into a congestion/sinuses per patient. Patient states that she would like Dr. Larose Kells to send a decongestant to Pharmacy.

## 2020-04-27 DIAGNOSIS — M25562 Pain in left knee: Secondary | ICD-10-CM | POA: Diagnosis not present

## 2020-04-29 DIAGNOSIS — M25562 Pain in left knee: Secondary | ICD-10-CM | POA: Diagnosis not present

## 2020-05-04 DIAGNOSIS — M25562 Pain in left knee: Secondary | ICD-10-CM | POA: Diagnosis not present

## 2020-05-06 DIAGNOSIS — M25562 Pain in left knee: Secondary | ICD-10-CM | POA: Diagnosis not present

## 2020-05-12 DIAGNOSIS — M25562 Pain in left knee: Secondary | ICD-10-CM | POA: Diagnosis not present

## 2020-05-14 DIAGNOSIS — M25562 Pain in left knee: Secondary | ICD-10-CM | POA: Diagnosis not present

## 2020-05-15 ENCOUNTER — Other Ambulatory Visit: Payer: Self-pay | Admitting: Internal Medicine

## 2020-05-18 DIAGNOSIS — M25562 Pain in left knee: Secondary | ICD-10-CM | POA: Diagnosis not present

## 2020-06-01 DIAGNOSIS — M25562 Pain in left knee: Secondary | ICD-10-CM | POA: Diagnosis not present

## 2020-06-08 DIAGNOSIS — M25562 Pain in left knee: Secondary | ICD-10-CM | POA: Diagnosis not present

## 2020-06-10 DIAGNOSIS — M25562 Pain in left knee: Secondary | ICD-10-CM | POA: Diagnosis not present

## 2020-06-16 ENCOUNTER — Telehealth: Payer: Self-pay | Admitting: Internal Medicine

## 2020-06-16 NOTE — Progress Notes (Signed)
  Chronic Care Management   Outreach Note  06/16/2020 Name: [name] MRN: [id number] DOB: [birth year]  Referred by: Colon Branch, MD Reason for referral : No chief complaint on file.   An unsuccessful telephone outreach was attempted today. The patient was referred to the pharmacist for assistance with care management and care coordination.   Follow Up Plan:   Carley Perdue [employer] Scheduler

## 2020-06-21 ENCOUNTER — Other Ambulatory Visit: Payer: Self-pay | Admitting: Internal Medicine

## 2020-06-21 DIAGNOSIS — Z23 Encounter for immunization: Secondary | ICD-10-CM | POA: Diagnosis not present

## 2020-06-23 ENCOUNTER — Telehealth: Payer: Self-pay | Admitting: Internal Medicine

## 2020-06-23 NOTE — Progress Notes (Signed)
  Chronic Care Management   Outreach Note  06/23/2020 Name: [name] MRN: [id number] DOB: 1940/12/03  Referred by: Colon Branch, MD Reason for referral : No chief complaint on file.   A second unsuccessful telephone outreach was attempted today. The patient was referred to pharmacist for assistance with care management and care coordination.  Follow Up Plan:   Carley Perdue [employer] Scheduler

## 2020-06-28 DIAGNOSIS — Z23 Encounter for immunization: Secondary | ICD-10-CM | POA: Diagnosis not present

## 2020-06-29 DIAGNOSIS — M545 Low back pain, unspecified: Secondary | ICD-10-CM | POA: Diagnosis not present

## 2020-06-29 DIAGNOSIS — R262 Difficulty in walking, not elsewhere classified: Secondary | ICD-10-CM | POA: Diagnosis not present

## 2020-06-29 DIAGNOSIS — M25551 Pain in right hip: Secondary | ICD-10-CM | POA: Diagnosis not present

## 2020-07-08 DIAGNOSIS — M545 Low back pain, unspecified: Secondary | ICD-10-CM | POA: Diagnosis not present

## 2020-07-08 DIAGNOSIS — M25551 Pain in right hip: Secondary | ICD-10-CM | POA: Diagnosis not present

## 2020-07-08 DIAGNOSIS — R262 Difficulty in walking, not elsewhere classified: Secondary | ICD-10-CM | POA: Diagnosis not present

## 2020-07-13 DIAGNOSIS — M545 Low back pain, unspecified: Secondary | ICD-10-CM | POA: Diagnosis not present

## 2020-07-13 DIAGNOSIS — R262 Difficulty in walking, not elsewhere classified: Secondary | ICD-10-CM | POA: Diagnosis not present

## 2020-07-13 DIAGNOSIS — M25551 Pain in right hip: Secondary | ICD-10-CM | POA: Diagnosis not present

## 2020-07-15 DIAGNOSIS — R262 Difficulty in walking, not elsewhere classified: Secondary | ICD-10-CM | POA: Diagnosis not present

## 2020-07-15 DIAGNOSIS — M25551 Pain in right hip: Secondary | ICD-10-CM | POA: Diagnosis not present

## 2020-07-15 DIAGNOSIS — M545 Low back pain, unspecified: Secondary | ICD-10-CM | POA: Diagnosis not present

## 2020-07-16 ENCOUNTER — Other Ambulatory Visit: Payer: Self-pay | Admitting: Internal Medicine

## 2020-07-20 DIAGNOSIS — R262 Difficulty in walking, not elsewhere classified: Secondary | ICD-10-CM | POA: Diagnosis not present

## 2020-07-20 DIAGNOSIS — M545 Low back pain, unspecified: Secondary | ICD-10-CM | POA: Diagnosis not present

## 2020-07-20 DIAGNOSIS — M25551 Pain in right hip: Secondary | ICD-10-CM | POA: Diagnosis not present

## 2020-07-22 DIAGNOSIS — M545 Low back pain, unspecified: Secondary | ICD-10-CM | POA: Diagnosis not present

## 2020-07-22 DIAGNOSIS — M25551 Pain in right hip: Secondary | ICD-10-CM | POA: Diagnosis not present

## 2020-07-22 DIAGNOSIS — R262 Difficulty in walking, not elsewhere classified: Secondary | ICD-10-CM | POA: Diagnosis not present

## 2020-08-03 DIAGNOSIS — M545 Low back pain, unspecified: Secondary | ICD-10-CM | POA: Diagnosis not present

## 2020-08-03 DIAGNOSIS — R262 Difficulty in walking, not elsewhere classified: Secondary | ICD-10-CM | POA: Diagnosis not present

## 2020-08-03 DIAGNOSIS — M25551 Pain in right hip: Secondary | ICD-10-CM | POA: Diagnosis not present

## 2020-08-26 DIAGNOSIS — M5416 Radiculopathy, lumbar region: Secondary | ICD-10-CM | POA: Diagnosis not present

## 2020-10-04 DIAGNOSIS — Z03818 Encounter for observation for suspected exposure to other biological agents ruled out: Secondary | ICD-10-CM | POA: Diagnosis not present

## 2020-10-04 DIAGNOSIS — J029 Acute pharyngitis, unspecified: Secondary | ICD-10-CM | POA: Diagnosis not present

## 2020-10-13 DIAGNOSIS — Z96652 Presence of left artificial knee joint: Secondary | ICD-10-CM | POA: Diagnosis not present

## 2020-10-13 DIAGNOSIS — M25552 Pain in left hip: Secondary | ICD-10-CM | POA: Diagnosis not present

## 2020-10-20 DIAGNOSIS — Z96652 Presence of left artificial knee joint: Secondary | ICD-10-CM | POA: Diagnosis not present

## 2020-10-20 DIAGNOSIS — R262 Difficulty in walking, not elsewhere classified: Secondary | ICD-10-CM | POA: Diagnosis not present

## 2020-10-28 DIAGNOSIS — Z96652 Presence of left artificial knee joint: Secondary | ICD-10-CM | POA: Diagnosis not present

## 2020-10-28 DIAGNOSIS — R262 Difficulty in walking, not elsewhere classified: Secondary | ICD-10-CM | POA: Diagnosis not present

## 2020-11-02 DIAGNOSIS — Z96652 Presence of left artificial knee joint: Secondary | ICD-10-CM | POA: Diagnosis not present

## 2020-11-02 DIAGNOSIS — R262 Difficulty in walking, not elsewhere classified: Secondary | ICD-10-CM | POA: Diagnosis not present

## 2020-11-09 DIAGNOSIS — Z96652 Presence of left artificial knee joint: Secondary | ICD-10-CM | POA: Diagnosis not present

## 2020-11-09 DIAGNOSIS — R262 Difficulty in walking, not elsewhere classified: Secondary | ICD-10-CM | POA: Diagnosis not present

## 2020-11-11 DIAGNOSIS — Z96652 Presence of left artificial knee joint: Secondary | ICD-10-CM | POA: Diagnosis not present

## 2020-11-11 DIAGNOSIS — R262 Difficulty in walking, not elsewhere classified: Secondary | ICD-10-CM | POA: Diagnosis not present

## 2020-11-18 DIAGNOSIS — R262 Difficulty in walking, not elsewhere classified: Secondary | ICD-10-CM | POA: Diagnosis not present

## 2020-11-18 DIAGNOSIS — Z96652 Presence of left artificial knee joint: Secondary | ICD-10-CM | POA: Diagnosis not present

## 2020-11-24 ENCOUNTER — Encounter: Payer: Medicare Other | Admitting: Internal Medicine

## 2020-11-28 ENCOUNTER — Other Ambulatory Visit: Payer: Self-pay | Admitting: Internal Medicine

## 2020-11-29 ENCOUNTER — Ambulatory Visit (INDEPENDENT_AMBULATORY_CARE_PROVIDER_SITE_OTHER): Payer: Medicare Other | Admitting: Internal Medicine

## 2020-11-29 ENCOUNTER — Ambulatory Visit (HOSPITAL_BASED_OUTPATIENT_CLINIC_OR_DEPARTMENT_OTHER)
Admission: RE | Admit: 2020-11-29 | Discharge: 2020-11-29 | Disposition: A | Discharge status: Home / Self Care | Payer: Medicare Other | Source: Ambulatory Visit | Attending: Internal Medicine | Admitting: Internal Medicine

## 2020-11-29 ENCOUNTER — Other Ambulatory Visit: Payer: Self-pay

## 2020-11-29 ENCOUNTER — Encounter: Payer: Self-pay | Admitting: Internal Medicine

## 2020-11-29 VITALS — BP 136/70 | HR 79 | Temp 98.0°F | Resp 16 | Ht 65.0 in | Wt 179.5 lb

## 2020-11-29 DIAGNOSIS — F411 Generalized anxiety disorder: Secondary | ICD-10-CM

## 2020-11-29 DIAGNOSIS — I10 Essential (primary) hypertension: Secondary | ICD-10-CM

## 2020-11-29 DIAGNOSIS — Z780 Asymptomatic menopausal state: Secondary | ICD-10-CM | POA: Diagnosis not present

## 2020-11-29 DIAGNOSIS — G4700 Insomnia, unspecified: Secondary | ICD-10-CM

## 2020-11-29 DIAGNOSIS — Z1159 Encounter for screening for other viral diseases: Secondary | ICD-10-CM | POA: Diagnosis not present

## 2020-11-29 DIAGNOSIS — E7800 Pure hypercholesterolemia, unspecified: Secondary | ICD-10-CM | POA: Diagnosis not present

## 2020-11-29 DIAGNOSIS — Z0000 Encounter for general adult medical examination without abnormal findings: Secondary | ICD-10-CM

## 2020-11-29 DIAGNOSIS — M85831 Other specified disorders of bone density and structure, right forearm: Secondary | ICD-10-CM | POA: Diagnosis not present

## 2020-11-29 LAB — CBC WITH DIFFERENTIAL/PLATELET
Basophils Absolute: 0 10*3/uL (ref 0.0–0.1)
Basophils Relative: 0.5 % (ref 0.0–3.0)
Eosinophils Absolute: 0.2 10*3/uL (ref 0.0–0.7)
Eosinophils Relative: 2.4 % (ref 0.0–5.0)
HCT: 42.4 % (ref 36.0–46.0)
Hemoglobin: 13.9 g/dL (ref 12.0–15.0)
Lymphocytes Relative: 28.6 % (ref 12.0–46.0)
Lymphs Abs: 2.2 10*3/uL (ref 0.7–4.0)
MCHC: 32.9 g/dL (ref 30.0–36.0)
MCV: 92.5 fl (ref 78.0–100.0)
Monocytes Absolute: 0.6 10*3/uL (ref 0.1–1.0)
Monocytes Relative: 7.4 % (ref 3.0–12.0)
Neutro Abs: 4.7 10*3/uL (ref 1.4–7.7)
Neutrophils Relative %: 61.1 % (ref 43.0–77.0)
Platelets: 256 10*3/uL (ref 150.0–400.0)
RBC: 4.59 Mil/uL (ref 3.87–5.11)
RDW: 14.2 % (ref 11.5–15.5)
WBC: 7.7 10*3/uL (ref 4.0–10.5)

## 2020-11-29 LAB — COMPREHENSIVE METABOLIC PANEL
ALT: 11 U/L (ref 0–35)
AST: 18 U/L (ref 0–37)
Albumin: 4.6 g/dL (ref 3.5–5.2)
Alkaline Phosphatase: 61 U/L (ref 39–117)
BUN: 31 mg/dL — ABNORMAL HIGH (ref 6–23)
CO2: 30 mEq/L (ref 19–32)
Calcium: 9.8 mg/dL (ref 8.4–10.5)
Chloride: 102 mEq/L (ref 96–112)
Creatinine, Ser: 1.3 mg/dL — ABNORMAL HIGH (ref 0.40–1.20)
GFR: 38.99 mL/min — ABNORMAL LOW (ref >60.00)
Glucose, Bld: 78 mg/dL (ref 70–99)
Potassium: 4.6 mEq/L (ref 3.5–5.1)
Sodium: 139 mEq/L (ref 135–145)
Total Bilirubin: 0.5 mg/dL (ref 0.2–1.2)
Total Protein: 6.4 g/dL (ref 6.0–8.3)

## 2020-11-29 LAB — LIPID PANEL
Cholesterol: 273 mg/dL — ABNORMAL HIGH (ref 0–200)
HDL: 125.1 mg/dL (ref >39.00)
LDL Cholesterol: 118 mg/dL — ABNORMAL HIGH (ref 0–99)
NonHDL: 147.99
Total CHOL/HDL Ratio: 2
Triglycerides: 152 mg/dL — ABNORMAL HIGH (ref 0.0–149.0)
VLDL: 30.4 mg/dL (ref 0.0–40.0)

## 2020-11-29 MED ORDER — LORAZEPAM 0.5 MG PO TABS: 0.5000 mg | ORAL_TABLET | Freq: Every evening | ORAL | 2 refills | Status: DC | PRN

## 2020-11-29 MED ORDER — LISINOPRIL-HYDROCHLOROTHIAZIDE 20-12.5 MG PO TABS: 1.0000 | ORAL_TABLET | Freq: Every day | ORAL | 2 refills | Status: DC

## 2020-11-29 NOTE — Assessment & Plan Note (Addendum)
Preventive care: Shingrix discussed COVID vaccine x3 Bones: Repeat DEXA In the past, elected no further breast and colon cancer screening Checking a hep C Advance directives info provided

## 2020-11-29 NOTE — Patient Instructions (Addendum)
  GO TO THE LAB : Get the blood work     [place], [place] Come back for   a checkup in 6 to 8 months   STOP BY THE FIRST FLOOR: Schedule a bone density test    Advanced care planning   It  is a process that supports adults in  understanding and sharing their preferences regarding future medical care.   The patient's preferences are recorded in documents called Advance Directives.    Advanced directives are completed (and can be modified at any time) while the patient is in full mental capacity.   The documentation should be available at all times to the patient, the family and the healthcare providers.  Bring in a copy to be scanned in your chart is an excellent idea and is recommended   This legal documents direct treatment decision making and/o  appoint a surrogate to make the decision if the patient is not capable to do so.    Advance directives can be documented in many types of formats,  documents have names such as:  Lliving will  Durable power of attorney for healthcare (healthcare proxy or healthcare power of attorney)  Combined directives  Physician orders for life-sustaining treatment  MOST form:  Excellent information is found  "[email or web address] website.    Information for the state of [place] is found there, the specific form is call MOST (medical orders for scope of treatment )

## 2020-11-29 NOTE — Telephone Encounter (Signed)
Appt later today.

## 2020-11-29 NOTE — Progress Notes (Unsigned)
Subjective:    Patient ID: [name], female    DOB: [birth year], 80 y.o.   MRN: [id number]  DOS:  11/29/2020 Type of visit - description: Follow-up Since the last office visit is doing about the same except that she is somewhat sad about her husband, he had to be transferred to a nursing home due to dementia.  Pain continue to be a major issue, nevertheless she is trying to be active  Due for a bone density test, on calcium and vitamin D   Review of Systems Denies chest pain or difficulty breathing No lower extremity edema.  Past Medical History:  Diagnosis Date  . Adenomatous colon polyp 03/2003  . Anxiety    ativan for sleep  . Baker's cyst   . Degenerative lumbar disc    Dr. Nelva Bush, epidural injections  . Goiter   . History of elevated homocysteine   . Hypertension 09/11/2005  . Lumbar radiculopathy    Dr. Nelva Bush, epidureal injections  . Lumbosacral radiculopathy    seeing Pain Management  . OA (osteoarthritis)     knee pain after TKR left , back pain- last steroid injection back 2 months ago Dr Nelva Bush  . PONV (postoperative nausea and vomiting)   . Postlaminectomy syndrome, lumbar region    Dr. Nelva Bush, recommended doing bilateral S1 transforaminal epidural with D5W  . Thyroid nodule    rt benign colloid nodule--left hyperplastic nodule- Thyroid bx 2006: neg    Past Surgical History:  Procedure Laterality Date  . ABDOMINAL HYSTERECTOMY    . BACK SURGERY  11-09-2011   at [facility], lumbar  . bakers cystectomy     right knee  . FACELIFT    . INCISIONAL HERNIA REPAIR    . KNEE ARTHROSCOPY     L   replacement 2007, Dr Corinne Ports, then a manipulation, then redo TKR 11/09 w/ Dr.Alusio. Parcial replacement again 09-2010  . KNEE SURGERY     x 5-  left  . OOPHORECTOMY    . SHOULDER SURGERY     replacements: L 2010, R 2012  . TOTAL HIP ARTHROPLASTY  3/11   Right   . TOTAL KNEE ARTHROPLASTY  05/20/2012   Procedure: TOTAL KNEE ARTHROPLASTY;  Surgeon: Gearlean Alf, MD;   Location: [facility];  Service: Orthopedics;  Laterality: Right;    Allergies as of 11/29/2020      Reactions   Tramadol Hives, Swelling   Per Pt she can take w/o reactions   Oxycodone-acetaminophen Nausea Only, Other (See Comments)   Headaches      Medication List       Accurate as of November 29, 2020 11:59 PM. If you have any questions, ask your nurse or doctor.        STOP taking these medications   pseudoephedrine 30 MG tablet Commonly known as: SUDAFED Stopped by: Kathlene November, MD     TAKE these medications   azelastine 0.1 % nasal spray Commonly known as: ASTELIN Place 2 sprays into both nostrils at bedtime as needed for rhinitis or allergies.   CALCIUM + D PO Take 600 mg by mouth daily after breakfast.   gabapentin 800 MG tablet Commonly known as: NEURONTIN TAKE 1 TABLET(800 MG) BY MOUTH FOUR TIMES DAILY   HYDROcodone-acetaminophen 5-325 MG tablet Commonly known as: NORCO/VICODIN Take 1 tablet by mouth 2 (two) times daily as needed.   hydrOXYzine 10 MG tablet Commonly known as: ATARAX/VISTARIL Take 1-2 tablets (10-20 mg total) by mouth at bedtime as needed.  lisinopril-hydrochlorothiazide 20-12.5 MG tablet Commonly known as: ZESTORETIC Take 1 tablet by mouth daily.   LORazepam 0.5 MG tablet Commonly known as: ATIVAN Take 1-2 tablets (0.5-1 mg total) by mouth at bedtime as needed for sleep.          Objective:   Physical Exam BP 136/70 (BP Location: Left Arm, Patient Position: Sitting, Cuff Size: Small)   Pulse 79   Temp 98 F (36.7 C) (Oral)   Resp 16   Ht 5\' 5"  (1.651 m)   Wt 179 lb 8 oz (81.4 kg)   SpO2 98%   BMI 29.87 kg/m  General:   Well developed, NAD, BMI noted.  HEENT:  Normocephalic . Face symmetric, atraumatic Lungs:  CTA B Normal respiratory effort, no intercostal retractions, no accessory muscle use. Heart: RRR, soft systolic murmur?Marland Kitchen  Abdomen:  Not distended, soft, non-tender. No rebound or rigidity.   Skin: Not pale. Not  jaundice Lower extremities: no pretibial edema bilaterally  Neurologic:  alert & oriented X3.  Speech normal, gait and transferring antalgic due to DJD on chronic pain Psych--  Cognition and judgment appear intact.  Cooperative with normal attention span and concentration.  Behavior appropriate. No anxious or depressed appearing.     Assessment    Assessment HTN Hyperlipidemia Anxiety, insomnia, ativan rx per pcp Elevated homocysteine Goiter  -Thyroid nodule BX 2006 neg, [place] 2013 stable,US 02-2016 -- bx 03-2016 benign -DX Graves' disease 2021 [facility]: sees pain mgmt ---DJD . ---Postlaminectomy syndrome, back surgery 2013  ---Intolerant to Lyrica, gabapentin helps --hydrocodone rx elsewhere -- Neuropathy: as off 11-2017 has seen 4 different specialists --chronic pain syndrome   PLAN HTN: Continue Zestoretic, check CMP, CBC Hyperlipidemia: Diet controlled, she would be willing to take medication if needed.  Check FLP Anxiety, insomnia: On Ativan, contract signed, PDMP okay, refill sent.  She also takes Atarax.  Refill sent.  Listening therapy provided about her husband's health. DJD, neuropathy, chronic pain syndrome: Managed elsewhere Preventive care reviewed. RTC 6 to 8 months   This visit occurred during the SARS-CoV-2 public health emergency.  Safety protocols were in place, including screening questions prior to the visit, additional usage of staff PPE, and extensive cleaning of exam room while observing appropriate contact time as indicated for disinfecting solutions.

## 2020-11-30 DIAGNOSIS — G4700 Insomnia, unspecified: Secondary | ICD-10-CM | POA: Insufficient documentation

## 2020-11-30 LAB — HEPATITIS C ANTIBODY
Hepatitis C Ab: NONREACTIVE
SIGNAL TO CUT-OFF: 0.12 (ref <1.00)

## 2020-11-30 NOTE — Assessment & Plan Note (Addendum)
HTN: Continue Zestoretic, check CMP, CBC Hyperlipidemia: Diet controlled, she would be willing to take medication if needed.  Check FLP Anxiety, insomnia: On Ativan, contract signed, PDMP okay, refill sent.  She also takes Atarax.  Refill sent.  Listening therapy provided about her husband's health. DJD, neuropathy, chronic pain syndrome: Managed elsewhere Preventive care reviewed. RTC 6 to 8 months

## 2020-12-03 MED ORDER — ATORVASTATIN CALCIUM 20 MG PO TABS: 20.0000 mg | ORAL_TABLET | Freq: Every day | ORAL | 3 refills | Status: DC

## 2020-12-03 NOTE — Addendum Note (Signed)
Addended byDamita Dunnings D on: 12/03/2020 02:13 PM   Modules accepted: Orders

## 2020-12-27 DIAGNOSIS — M25512 Pain in left shoulder: Secondary | ICD-10-CM | POA: Diagnosis not present

## 2020-12-27 DIAGNOSIS — Z96612 Presence of left artificial shoulder joint: Secondary | ICD-10-CM | POA: Diagnosis not present

## 2021-01-03 DIAGNOSIS — M216X1 Other acquired deformities of right foot: Secondary | ICD-10-CM | POA: Diagnosis not present

## 2021-01-03 DIAGNOSIS — B070 Plantar wart: Secondary | ICD-10-CM | POA: Diagnosis not present

## 2021-01-06 DIAGNOSIS — M25562 Pain in left knee: Secondary | ICD-10-CM | POA: Diagnosis not present

## 2021-01-10 ENCOUNTER — Telehealth: Payer: Self-pay | Admitting: Internal Medicine

## 2021-01-10 NOTE — Telephone Encounter (Signed)
Spoke w Pt- informed of recommendations. Pt will call in 2-3 weeks if diarrhea is better, sooner if not.

## 2021-01-10 NOTE — Telephone Encounter (Signed)
It is possible that is a side effect. Plan: Stop atorvastatin. If diarrhea gets better, start a different medication in 2 to 3 weeks: Pravachol 40 mg 1 p.o. nightly . Send a prescription for 30 and 3 refills Labs in 2 months.

## 2021-01-10 NOTE — Telephone Encounter (Signed)
Please advise 

## 2021-01-10 NOTE — Telephone Encounter (Signed)
Patient states she is taking a  cholesterol medication and it is making her have diarrhea, patient would like to know if that will  going away once patient gets use to taking medication.

## 2021-01-11 ENCOUNTER — Telehealth: Payer: Self-pay | Admitting: Internal Medicine

## 2021-01-11 MED ORDER — ZOLPIDEM TARTRATE 5 MG PO TABS: 5.0000 mg | ORAL_TABLET | Freq: Every evening | ORAL | 0 refills | Status: DC | PRN

## 2021-01-11 NOTE — Telephone Encounter (Signed)
Called pt and advised of recommendations, she verbalized understanding and agreed to proceed with the Ambien. (preferred pharmacy [facility] in [place]

## 2021-01-11 NOTE — Telephone Encounter (Signed)
She is already taking Ativan 0.5 mg 1 or 2 tablets at bedtime and Atarax 10 mg 1 or 2 tablets at bedtime. If she is willing to try Ambien, I am willing to prescribe it. She will have to stop both Ativan and Atarax if that is the case.  Let me know if she likes to proceed. Otherwise we can arrange a virtual visit to discuss more.

## 2021-01-11 NOTE — Telephone Encounter (Signed)
CallerNesreen Albano  Call Back @ [phone]  Patient states she can not sleep and would like another medication or increase on current strength of LORazepam (ATIVAN) 0.5 MG tablet [[id number]].   Please advise

## 2021-01-11 NOTE — Telephone Encounter (Signed)
Ambien sent.

## 2021-01-11 NOTE — Telephone Encounter (Signed)
Please advise 

## 2021-01-17 DIAGNOSIS — B070 Plantar wart: Secondary | ICD-10-CM | POA: Diagnosis not present

## 2021-01-26 MED ORDER — PRAVASTATIN SODIUM 40 MG PO TABS: 40.0000 mg | ORAL_TABLET | Freq: Every day | ORAL | 3 refills | Status: DC

## 2021-01-26 NOTE — Telephone Encounter (Signed)
Patient states she stopped medication about 2 weeks ago, diarrhea is gone. Dr.Paz stated he was going to prescribe a different type of cholesterol medication.   [id number] - [place], [place] - [address]  [address], [place]  [address]  Phone:  [phone] Fax:  [phone]

## 2021-01-26 NOTE — Telephone Encounter (Signed)
Rx sent 

## 2021-01-26 NOTE — Addendum Note (Signed)
Addended byDamita Dunnings D on: 01/26/2021 10:51 AM   Modules accepted: Orders

## 2021-02-16 ENCOUNTER — Telehealth: Payer: Self-pay | Admitting: Internal Medicine

## 2021-02-16 NOTE — Telephone Encounter (Signed)
Requesting: Ambien 5mg  Contract: 11/29/2020 UDS: N/A Last Visit: 11/29/2020 Next Visit: 08/01/2021 Last Refill: 01/11/2021 #30 and 0RF  Please Advise

## 2021-02-16 NOTE — Telephone Encounter (Signed)
PDMP okay, prescription sent 

## 2021-03-03 ENCOUNTER — Telehealth: Payer: Self-pay

## 2021-03-03 NOTE — Telephone Encounter (Signed)
PA approved. Effective from 03/03/2021 through 03/03/2022.

## 2021-03-03 NOTE — Telephone Encounter (Signed)
PA initiated via Covermymeds; KEY: [id number]. Awaiting determination.

## 2021-04-21 ENCOUNTER — Other Ambulatory Visit (HOSPITAL_COMMUNITY): Payer: Self-pay | Admitting: Orthopedic Surgery

## 2021-04-21 ENCOUNTER — Other Ambulatory Visit: Payer: Self-pay | Admitting: Orthopedic Surgery

## 2021-04-21 DIAGNOSIS — Z96652 Presence of left artificial knee joint: Secondary | ICD-10-CM | POA: Diagnosis not present

## 2021-04-21 DIAGNOSIS — M25552 Pain in left hip: Secondary | ICD-10-CM | POA: Diagnosis not present

## 2021-04-24 ENCOUNTER — Other Ambulatory Visit: Payer: Self-pay | Admitting: Internal Medicine

## 2021-05-03 DIAGNOSIS — Z5181 Encounter for therapeutic drug level monitoring: Secondary | ICD-10-CM | POA: Diagnosis not present

## 2021-05-03 DIAGNOSIS — G894 Chronic pain syndrome: Secondary | ICD-10-CM | POA: Diagnosis not present

## 2021-05-03 DIAGNOSIS — Z79899 Other long term (current) drug therapy: Secondary | ICD-10-CM | POA: Diagnosis not present

## 2021-05-06 ENCOUNTER — Other Ambulatory Visit: Payer: Self-pay

## 2021-05-06 ENCOUNTER — Encounter (HOSPITAL_COMMUNITY)
Admission: RE | Admit: 2021-05-06 | Discharge: 2021-05-06 | Disposition: A | Discharge status: Home / Self Care | Payer: Medicare Other | Source: Ambulatory Visit | Attending: Orthopedic Surgery | Admitting: Orthopedic Surgery

## 2021-05-06 DIAGNOSIS — M25562 Pain in left knee: Secondary | ICD-10-CM | POA: Diagnosis not present

## 2021-05-06 DIAGNOSIS — M25561 Pain in right knee: Secondary | ICD-10-CM | POA: Diagnosis not present

## 2021-05-06 DIAGNOSIS — Z96652 Presence of left artificial knee joint: Secondary | ICD-10-CM | POA: Diagnosis not present

## 2021-05-06 DIAGNOSIS — M7989 Other specified soft tissue disorders: Secondary | ICD-10-CM | POA: Diagnosis not present

## 2021-05-06 MED ORDER — TECHNETIUM TC 99M MEDRONATE IV KIT
20.0000 | PACK | Freq: Once | INTRAVENOUS | Status: AC | PRN
  Administered 2021-05-06: 20 via INTRAVENOUS

## 2021-05-17 DIAGNOSIS — Z23 Encounter for immunization: Secondary | ICD-10-CM | POA: Diagnosis not present

## 2021-05-18 ENCOUNTER — Other Ambulatory Visit: Payer: Self-pay

## 2021-05-18 ENCOUNTER — Telehealth: Payer: Self-pay

## 2021-05-18 MED ORDER — LISINOPRIL-HYDROCHLOROTHIAZIDE 20-12.5 MG PO TABS
1.0000 | ORAL_TABLET | Freq: Every day | ORAL | 2 refills | Status: DC
Start: 2021-05-18 — End: 2021-12-08

## 2021-05-18 NOTE — Telephone Encounter (Signed)
LMOM informing Pt to call office to schedule visit.

## 2021-05-18 NOTE — Telephone Encounter (Signed)
Due for office visit, please arrange

## 2021-05-18 NOTE — Telephone Encounter (Signed)
Appt scheduled 07/11/21.

## 2021-05-18 NOTE — Telephone Encounter (Signed)
Received surgical clearance form from [facility]. Pt is needing L knee femoral vs TKA revision on 08/10/21 with Dr. Wynelle Link. Please advise if Pt needs surgical clearance appt.

## 2021-05-24 ENCOUNTER — Encounter: Payer: Self-pay | Admitting: Internal Medicine

## 2021-05-26 ENCOUNTER — Telehealth: Payer: Self-pay | Admitting: Internal Medicine

## 2021-05-26 DIAGNOSIS — Z23 Encounter for immunization: Secondary | ICD-10-CM | POA: Diagnosis not present

## 2021-05-26 NOTE — Telephone Encounter (Signed)
Requesting: Ambien '5mg'$  Contract:11/29/2020 UDS: 04/19/2020 Last Visit: 11/29/2020 Next Visit: 07/11/2021 Last Refill: 02/16/2021 #30 and 2RF  Please Advise

## 2021-05-26 NOTE — Telephone Encounter (Signed)
PDMP okay, prescription sent 

## 2021-06-28 DIAGNOSIS — M5416 Radiculopathy, lumbar region: Secondary | ICD-10-CM | POA: Diagnosis not present

## 2021-07-11 ENCOUNTER — Encounter: Payer: Self-pay | Admitting: Internal Medicine

## 2021-07-11 ENCOUNTER — Other Ambulatory Visit: Payer: Self-pay

## 2021-07-11 ENCOUNTER — Ambulatory Visit (INDEPENDENT_AMBULATORY_CARE_PROVIDER_SITE_OTHER): Payer: Medicare Other | Admitting: Internal Medicine

## 2021-07-11 VITALS — BP 118/64 | HR 65 | Temp 98.6°F | Resp 16 | Ht 65.0 in | Wt 178.0 lb

## 2021-07-11 DIAGNOSIS — Z01818 Encounter for other preprocedural examination: Secondary | ICD-10-CM

## 2021-07-11 DIAGNOSIS — E7800 Pure hypercholesterolemia, unspecified: Secondary | ICD-10-CM | POA: Diagnosis not present

## 2021-07-11 DIAGNOSIS — G4700 Insomnia, unspecified: Secondary | ICD-10-CM | POA: Diagnosis not present

## 2021-07-11 DIAGNOSIS — R739 Hyperglycemia, unspecified: Secondary | ICD-10-CM | POA: Diagnosis not present

## 2021-07-11 DIAGNOSIS — F411 Generalized anxiety disorder: Secondary | ICD-10-CM

## 2021-07-11 LAB — LIPID PANEL
Cholesterol: 249 mg/dL — ABNORMAL HIGH (ref 0–200)
HDL: 118.1 mg/dL (ref >39.00)
LDL Cholesterol: 108 mg/dL — ABNORMAL HIGH (ref 0–99)
NonHDL: 130.58
Total CHOL/HDL Ratio: 2
Triglycerides: 112 mg/dL (ref 0.0–149.0)
VLDL: 22.4 mg/dL (ref 0.0–40.0)

## 2021-07-11 LAB — CBC WITH DIFFERENTIAL/PLATELET
Basophils Absolute: 0 10*3/uL (ref 0.0–0.1)
Basophils Relative: 0.5 % (ref 0.0–3.0)
Eosinophils Absolute: 0.2 10*3/uL (ref 0.0–0.7)
Eosinophils Relative: 2.2 % (ref 0.0–5.0)
HCT: 42.6 % (ref 36.0–46.0)
Hemoglobin: 13.9 g/dL (ref 12.0–15.0)
Lymphocytes Relative: 27 % (ref 12.0–46.0)
Lymphs Abs: 2.2 10*3/uL (ref 0.7–4.0)
MCHC: 32.6 g/dL (ref 30.0–36.0)
MCV: 94.7 fl (ref 78.0–100.0)
Monocytes Absolute: 0.6 10*3/uL (ref 0.1–1.0)
Monocytes Relative: 6.7 % (ref 3.0–12.0)
Neutro Abs: 5.2 10*3/uL (ref 1.4–7.7)
Neutrophils Relative %: 63.6 % (ref 43.0–77.0)
Platelets: 258 10*3/uL (ref 150.0–400.0)
RBC: 4.5 Mil/uL (ref 3.87–5.11)
RDW: 13.9 % (ref 11.5–15.5)
WBC: 8.2 10*3/uL (ref 4.0–10.5)

## 2021-07-11 LAB — COMPREHENSIVE METABOLIC PANEL
ALT: 13 U/L (ref 0–35)
AST: 22 U/L (ref 0–37)
Albumin: 4.6 g/dL (ref 3.5–5.2)
Alkaline Phosphatase: 62 U/L (ref 39–117)
BUN: 25 mg/dL — ABNORMAL HIGH (ref 6–23)
CO2: 31 mEq/L (ref 19–32)
Calcium: 10.3 mg/dL (ref 8.4–10.5)
Chloride: 100 mEq/L (ref 96–112)
Creatinine, Ser: 1.21 mg/dL — ABNORMAL HIGH (ref 0.40–1.20)
GFR: 42.31 mL/min — ABNORMAL LOW (ref >60.00)
Glucose, Bld: 94 mg/dL (ref 70–99)
Potassium: 4.4 mEq/L (ref 3.5–5.1)
Sodium: 140 mEq/L (ref 135–145)
Total Bilirubin: 0.6 mg/dL (ref 0.2–1.2)
Total Protein: 6.5 g/dL (ref 6.0–8.3)

## 2021-07-11 LAB — HEMOGLOBIN A1C: Hgb A1c MFr Bld: 5.9 % (ref 4.6–6.5)

## 2021-07-11 NOTE — Patient Instructions (Addendum)
Check the  blood pressure once a week BP GOAL is between 110/65 and  135/85. If it is consistently higher or lower, let me know   GO TO THE LAB : Get the blood work     [name], [place] back for a checkup in 3 to 4 months

## 2021-07-11 NOTE — Assessment & Plan Note (Signed)
Surgical clearance: The patient is a 31, she has no cardiopulmonary symptoms, despite the pain she still goes to the gym, work on the stationary bike and even some treadmill.  She used to be even more active up until few months ago. EKG: Sinus bradycardia, also noted on previous EKG. Informally discussed with cardiology, okay to proceed with surgery w/o further testing  HTN: On Zestoretic, check CMP and CBC Hyperglycemia Per chart review: Check A1c High cholesterol: Since the last visit he started statins, atorvastatin caused diarrhea, now on Pravachol, check FLP. Anxiety, insomnia: Zolpidem works well for her.  Contract signed. Preventive care: Up-to-date on COVID flu vaccines RTC 3 months

## 2021-07-11 NOTE — Progress Notes (Signed)
Subjective:    Patient ID: [name], female    DOB: 11-13-1940, 80 y.o.   MRN: [id number]  DOS:  07/11/2021 Type of visit - description: Surgical clearance  The patient needs a revision on the L knee.  Surgery scheduled for August 10, 2021. Due to knee pain, she has balance issues.  Other than that, she feels well. Denies chest pain no difficulty breathing No edema or palpitations No DOE No nausea, vomiting, diarrhea.  Despite the pain, she is active, goes to the gym, does stationary bike and some treadmill without CV sxs.  Review of Systems See above   Past Medical History:  Diagnosis Date   Adenomatous colon polyp 03/2003   Anxiety    ativan for sleep   Baker's cyst    Degenerative lumbar disc    Dr. Nelva Bush, epidural injections   Goiter    History of elevated homocysteine    Hypertension 09/11/2005   Lumbar radiculopathy    Dr. Nelva Bush, epidureal injections   Lumbosacral radiculopathy    seeing Pain Management   OA (osteoarthritis)     knee pain after TKR left , back pain- last steroid injection back 2 months ago Dr Nelva Bush   PONV (postoperative nausea and vomiting)    Postlaminectomy syndrome, lumbar region    Dr. Nelva Bush, recommended doing bilateral S1 transforaminal epidural with D5W   Thyroid nodule    rt benign colloid nodule--left hyperplastic nodule- Thyroid bx 2006: neg    Past Surgical History:  Procedure Laterality Date   ABDOMINAL HYSTERECTOMY     BACK SURGERY  11-09-2011   at [facility], lumbar   bakers cystectomy     right knee   FACELIFT     INCISIONAL HERNIA REPAIR     KNEE ARTHROSCOPY     L   replacement 2007, Dr Corinne Ports, then a manipulation, then redo TKR 11/09 w/ Dr.Alusio. Parcial replacement again 09-2010   KNEE SURGERY     x 5-  left   OOPHORECTOMY     SHOULDER SURGERY     replacements: L 2010, R 2012   TOTAL HIP ARTHROPLASTY  3/11   Right    TOTAL KNEE ARTHROPLASTY  05/20/2012   Procedure: TOTAL KNEE ARTHROPLASTY;  Surgeon: Gearlean Alf,  MD;  Location: [facility];  Service: Orthopedics;  Laterality: Right;    Allergies as of 07/11/2021       Reactions   Tramadol Hives, Swelling   Per Pt she can take w/o reactions   Oxycodone-acetaminophen Nausea Only, Other (See Comments)   Headaches        Medication List        Accurate as of July 11, 2021 11:16 AM. If you have any questions, ask your nurse or doctor.          azelastine 0.1 % nasal spray Commonly known as: ASTELIN Place 2 sprays into both nostrils at bedtime as needed for rhinitis or allergies.   CALCIUM + D PO Take 600 mg by mouth daily after breakfast.   gabapentin 800 MG tablet Commonly known as: NEURONTIN TAKE 1 TABLET(800 MG) BY MOUTH FOUR TIMES DAILY   HYDROcodone-acetaminophen 5-325 MG tablet Commonly known as: NORCO/VICODIN Take 1 tablet by mouth 2 (two) times daily as needed.   hydrOXYzine 10 MG tablet Commonly known as: ATARAX/VISTARIL Take 1-2 tablets (10-20 mg total) by mouth at bedtime as needed.   lisinopril-hydrochlorothiazide 20-12.5 MG tablet Commonly known as: ZESTORETIC Take 1 tablet by mouth daily.   LORazepam  0.5 MG tablet Commonly known as: ATIVAN Take 1-2 tablets (0.5-1 mg total) by mouth at bedtime as needed for sleep.   pravastatin 40 MG tablet Commonly known as: PRAVACHOL TAKE 1 TABLET BY MOUTH EVERYDAY AT BEDTIME   zolpidem 5 MG tablet Commonly known as: AMBIEN TAKE 1 TABLET BY MOUTH EVERY DAY AT BEDTIME AS NEEDED FOR SLEEP           Objective:   Physical Exam BP 118/64 (BP Location: Left Arm, Patient Position: Sitting, Cuff Size: Small)   Pulse 65   Temp 98.6 F (37 C) (Oral)   Resp 16   Ht 5\' 5"  (1.651 m)   Wt 178 lb (80.7 kg)   SpO2 98%   BMI 29.62 kg/m  General:   Well developed, NAD, BMI noted.  HEENT:  Normocephalic . Face symmetric, atraumatic Neck: No JVD at 45 degrees Lungs:  CTA B Normal respiratory effort, no intercostal retractions, no accessory muscle use. Heart: RRR,  no  murmur.  Abdomen:  Not distended, soft, non-tender. No rebound or rigidity.   Skin: Not pale. Not jaundice Lower extremities: no pretibial edema bilaterally  Neurologic:  alert & oriented X3.  Speech normal, gait appropriate for age and unassisted Psych--  Cognition and judgment appear intact.  Cooperative with normal attention span and concentration.  Behavior appropriate. No anxious or depressed appearing.     Assessment    ASSESSMENT HTN Hyperlipidemia: Atorvastatin >> diarrhea), Rx pravastatin Anxiety, insomnia, ativan rx per pcp Elevated homocysteine Goiter  -Thyroid nodule BX 2006 neg, [place] 2013 stable,US 02-2016 -- bx 03-2016 benign -DX Graves' disease 2021 [facility]: sees pain mgmt ---DJD . ---Postlaminectomy syndrome, back surgery 2013  ---Intolerant to Lyrica, gabapentin helps --hydrocodone rx elsewhere -- Neuropathy: as off 11-2017 has seen 4 different specialists --chronic pain syndrome   PLAN Surgical clearance: The patient is a 80, she has no cardiopulmonary symptoms, despite the pain she still goes to the gym, work on the stationary bike and even some treadmill.  She used to be even more active up until few months ago. EKG: Sinus bradycardia, also noted on previous EKG. Informally discussed with cardiology, okay to proceed with surgery w/o further testing  HTN: On Zestoretic, check CMP and CBC Hyperglycemia Per chart review: Check A1c High cholesterol: Since the last visit he started statins, atorvastatin caused diarrhea, now on Pravachol, check FLP. Anxiety, insomnia: Zolpidem works well for her.  Contract signed. Preventive care: Up-to-date on COVID flu vaccines RTC 3 months   This visit occurred during the SARS-CoV-2 public health emergency.  Safety protocols were in place, including screening questions prior to the visit, additional usage of staff PPE, and extensive cleaning of exam room while observing appropriate contact time as indicated for disinfecting  solutions.

## 2021-07-12 ENCOUNTER — Other Ambulatory Visit: Payer: Self-pay | Admitting: Internal Medicine

## 2021-07-12 MED ORDER — EZETIMIBE 10 MG PO TABS: 10.0000 mg | ORAL_TABLET | Freq: Every day | ORAL | 3 refills | Status: DC

## 2021-07-16 ENCOUNTER — Other Ambulatory Visit: Payer: Self-pay | Admitting: Internal Medicine

## 2021-07-18 ENCOUNTER — Telehealth: Payer: Self-pay

## 2021-07-18 NOTE — Telephone Encounter (Signed)
Patient reports symptoms resolved.

## 2021-07-18 NOTE — Telephone Encounter (Signed)
Spoke with patient regarding symptoms/anxiety.  Patient states anxiety has resolved. Declines appointment at this time.    [facility] - Client TELEPHONE ADVICE RECORDAccessNurse Patient Name:[name] Phone Number:[phone](Primary),[phone](Secondary) Reason for Call Symptomatic / Request for Health Information Initial Comment Caller states she is having cold sweats due to anxiety. Translation No Nurse Assessment Nurse: Self, RN, Nira Conn Date/Time (Eastern Time): 07/16/2021 8:51:38 AM Confirm and document reason for call. If symptomatic, describe symptoms. ---Caller says she called office a couple days ago. Caller says she has increased worry and anxiety. Caller say she is having cold sweats. Caller say she was on Lorazepam years ago. Does the patient have any new or worsening symptoms? ---Yes Will a triage be completed? ---Yes Related visit to physician within the last 2 weeks? ---No Does the PT have any chronic conditions? (i.e. diabetes, asthma, this includes High risk factors for pregnancy, etc.) ---Yes List chronic conditions. ---Multiple Joint Surg. Is this a behavioral health or substance abuse call? ---No Guidelines Guideline Title Affirmed Question Affirmed Notes Nurse Date/Time (Eastern Time) Anxiety and Panic Attack[1] Panic attacksymptoms (e.g.,sudden onset ofintense fear andsymptoms such as dizziness, feelingof impending doomor fear of dying,hyperventilation,numbness or tingling,Self, RN, Nira Conn 07/16/2021 8:53:55AM ted restarting the Lorazepam would need to be discussed with MD during regular office hours. Referrals GO TO FACILITY REFUSED

## 2021-07-18 NOTE — Telephone Encounter (Signed)
Surgical clearance form faxed to [facility] at [place]. Form sent for scanning

## 2021-07-18 NOTE — Telephone Encounter (Signed)
Received fax confirmation

## 2021-07-29 ENCOUNTER — Encounter (HOSPITAL_COMMUNITY): Payer: Medicare Other

## 2021-08-01 ENCOUNTER — Ambulatory Visit: Payer: Medicare Other | Admitting: Internal Medicine

## 2021-08-17 ENCOUNTER — Telehealth: Payer: Self-pay | Admitting: Internal Medicine

## 2021-08-17 DIAGNOSIS — L03115 Cellulitis of right lower limb: Secondary | ICD-10-CM | POA: Diagnosis not present

## 2021-08-17 NOTE — Telephone Encounter (Signed)
Pt. Called and stated that she has been having pain in her right leg for about a week. Over the course of the last 3days it has gotten worse. She states it starts in her inner thigh and goes down to her ankle. She is also having some ankle swelling as well. I informed her that we didn't have any visits in office but she will need to be assessed to see exactly whats wrong. She stated she didn't want to come in unless she needed to and wanted to see what Dr.Paz wants her to do before she moves forward. Please advise.

## 2021-08-17 NOTE — Telephone Encounter (Signed)
If she has actual swelling, redness, fever chills, rash >>> she does need to be seen ASAP.  Urgent care?. If it is only pain, it is a possibility musculoskeletal issue, arrange a visit with me within the next few days

## 2021-08-17 NOTE — Telephone Encounter (Signed)
LMOM informing Pt of PCP recommendations. I have asked her to call back to let us know if she decides to go to urgent care.

## 2021-08-17 NOTE — Telephone Encounter (Signed)
Please advise 

## 2021-08-19 ENCOUNTER — Other Ambulatory Visit: Payer: Self-pay | Admitting: Internal Medicine

## 2021-10-04 DIAGNOSIS — M5416 Radiculopathy, lumbar region: Secondary | ICD-10-CM | POA: Diagnosis not present

## 2021-10-06 ENCOUNTER — Telehealth: Payer: Self-pay | Admitting: Internal Medicine

## 2021-10-06 NOTE — Telephone Encounter (Signed)
PDMP okay, Rx sent 

## 2021-10-06 NOTE — Telephone Encounter (Signed)
Requesting: Ambien 5mg   Contract: 07/11/2021 UDS: 04/19/2020 Last Visit: 07/11/2021 Next Visit: 11/08/2021 Last Refill: 05/26/2021 #30 and 3RF  Please Advise

## 2021-10-12 ENCOUNTER — Other Ambulatory Visit (HOSPITAL_COMMUNITY): Payer: Self-pay

## 2021-10-12 NOTE — Patient Instructions (Addendum)
DUE TO COVID-19 ONLY ONE VISITOR IS ALLOWED TO COME WITH YOU AND STAY IN THE WAITING ROOM ONLY DURING PRE OP AND PROCEDURE.   **NO VISITORS ARE ALLOWED IN THE SHORT STAY AREA OR RECOVERY ROOM!!**  IF YOU WILL BE ADMITTED INTO THE HOSPITAL YOU ARE ALLOWED ONLY TWO SUPPORT PEOPLE DURING VISITATION HOURS ONLY (7 AM -8PM)   The support person(s) must pass our screening, gel in and out, and wear a mask at all times, including in the patients room. Patients must also wear a mask when staff or their support person are in the room. Visitors GUEST BADGE MUST BE WORN VISIBLY  One adult visitor may remain with you overnight and MUST be in the room by 8 P.M.  No visitors under the age of 61. Any visitor under the age of 62 must be accompanied by an adult.    COVID SWAB TESTING MUST BE COMPLETED ON:  10/24/21 @ 12:00 PM   Site: [facility] [place] [place] Enter: Main Entrance have a seat in the waiting area to the right of main entrance (DO NOT [place]!!!!!) Dial: [phone] to alert staff you have arrived  You are not required to quarantine, however you are required to wear a well-fitted mask when you are out and around people not in your household.  Hand Hygiene often Do NOT share personal items Notify your provider if you are in close contact with someone who has COVID or you develop fever 100.4 or greater, new onset of sneezing, cough, sore throat, shortness of breath or body aches.       Your procedure is scheduled on: 10/26/21   Report to [facility] Main Entrance    Report to admitting at 6:15 AM   Call this number if you have problems the morning of surgery [phone]   Do not eat food :After Midnight.   May have liquids until 5:30 AM day of surgery  CLEAR LIQUID DIET  Foods Allowed                                                                     Foods Excluded  Water, Black Coffee and tea, regular and decaf                              liquids that you cannot  Plain Jell-O in any flavor  (No red)                                           see through such as: Fruit ices (not with fruit pulp)                                     milk, soups, orange juice              Iced Popsicles (No red)  All solid food                                   Apple juices Sports drinks like Gatorade (No red) Lightly seasoned clear broth or consume(fat free) Sugar     The day of surgery:  Drink ONE (1) Pre-Surgery Clear Ensure at 5:30 AM the morning of surgery. Drink in one sitting. Do not sip.  This drink was given to you during your hospital  pre-op appointment visit. Nothing else to drink after completing the  Pre-Surgery Clear Ensure.          If you have questions, please contact your surgeons office.  FOLLOW BOWEL PREP INSTRUCTIONS YOU RECEIVED FROM YOUR SURGEON'S OFFICE!!!     Oral Hygiene is also important to reduce your risk of infection.                                    Remember - BRUSH YOUR TEETH THE MORNING OF SURGERY WITH YOUR REGULAR TOOTHPASTE   Stop all vitamins and supplements 7 days before surgery   Take these medicines the morning of surgery with A SIP OF WATER: Zetia, Gabapentin, Norco                              You may not have any metal on your body including hair pins, jewelry, and body piercing             Do not wear make-up, lotions, powders, perfumes, or deodorant  Do not wear nail polish including gel and S&S, artificial/acrylic nails, or any other type of covering on natural nails including finger and toenails. If you have artificial nails, gel coating, etc. that needs to be removed by a nail salon please have this removed prior to surgery or surgery may need to be canceled/ delayed if the surgeon/ anesthesia feels like they are unable to be safely monitored.   Do not shave  48 hours prior to surgery.    Do not bring valuables to the hospital. [place].   Bring small overnight bag day of surgery.   Special Instructions: Bring a copy of your healthcare power of attorney and living will documents         the day of surgery if you haven't scanned them before.              Please read over the following fact sheets you were given: IF YOU HAVE QUESTIONS ABOUT YOUR PRE-OP INSTRUCTIONS PLEASE CALL [facility] - Preparing for Surgery Before surgery, you can play an important role.  Because skin is not sterile, your skin needs to be as free of germs as possible.  You can reduce the number of germs on your skin by washing with CHG (chlorahexidine gluconate) soap before surgery.  CHG is an antiseptic cleaner which kills germs and bonds with the skin to continue killing germs even after washing. Please DO NOT use if you have an allergy to CHG or antibacterial soaps.  If your skin becomes reddened/irritated stop using the CHG and inform your nurse when you arrive at Short Stay. Do not shave (including legs  and underarms) for at least 48 hours prior to the first CHG shower.  You may shave your face/neck.  Please follow these instructions carefully:  1.  Shower with CHG Soap the night before surgery and the  morning of surgery.  2.  If you choose to wash your hair, wash your hair first as usual with your normal  shampoo.  3.  After you shampoo, rinse your hair and body thoroughly to remove the shampoo.                             4.  Use CHG as you would any other liquid soap.  You can apply chg directly to the skin and wash.  Gently with a scrungie or clean washcloth.  5.  Apply the CHG Soap to your body ONLY FROM THE NECK DOWN.   Do   not use on face/ open                           Wound or open sores. Avoid contact with eyes, ears mouth and   genitals (private parts).                       Wash face,  Genitals (private parts) with your normal soap.             6.  Wash thoroughly,  paying special attention to the area where your    surgery  will be performed.  7.  Thoroughly rinse your body with warm water from the neck down.  8.  DO NOT shower/wash with your normal soap after using and rinsing off the CHG Soap.                9.  Pat yourself dry with a clean towel.            10.  Wear clean pajamas.            11.  Place clean sheets on your bed the night of your first shower and do not  sleep with pets. Day of Surgery : Do not apply any lotions/deodorants the morning of surgery.  Please wear clean clothes to the hospital/surgery center.  FAILURE TO FOLLOW THESE INSTRUCTIONS MAY RESULT IN THE CANCELLATION OF YOUR SURGERY  PATIENT SIGNATURE_________________________________  NURSE SIGNATURE__________________________________  ________________________________________________________________________   Adam Phenix  An incentive spirometer is a tool that can help keep your lungs clear and active. This tool measures how well you are filling your lungs with each breath. Taking long deep breaths may help reverse or decrease the chance of developing breathing (pulmonary) problems (especially infection) following: A long period of time when you are unable to move or be active. BEFORE THE PROCEDURE  If the spirometer includes an indicator to show your best effort, your nurse or respiratory therapist will set it to a desired goal. If possible, sit up straight or lean slightly forward. Try not to slouch. Hold the incentive spirometer in an upright position. INSTRUCTIONS FOR USE  Sit on the edge of your bed if possible, or sit up as far as you can in bed or on a chair. Hold the incentive spirometer in an upright position. Breathe out normally. Place the mouthpiece in your mouth and seal your lips tightly around it. Breathe in slowly and as deeply as possible, raising the piston or the ball toward the top of  the column. Hold your breath for 3-5 seconds or for as long  as possible. Allow the piston or ball to fall to the bottom of the column. Remove the mouthpiece from your mouth and breathe out normally. Rest for a few seconds and repeat Steps 1 through 7 at least 10 times every 1-2 hours when you are awake. Take your time and take a few normal breaths between deep breaths. The spirometer may include an indicator to show your best effort. Use the indicator as a goal to work toward during each repetition. After each set of 10 deep breaths, practice coughing to be sure your lungs are clear. If you have an incision (the cut made at the time of surgery), support your incision when coughing by placing a pillow or rolled up towels firmly against it. Once you are able to get out of bed, walk around indoors and cough well. You may stop using the incentive spirometer when instructed by your caregiver.  RISKS AND COMPLICATIONS Take your time so you do not get dizzy or light-headed. If you are in pain, you may need to take or ask for pain medication before doing incentive spirometry. It is harder to take a deep breath if you are having pain. AFTER USE Rest and breathe slowly and easily. It can be helpful to keep track of a log of your progress. Your caregiver can provide you with a simple table to help with this. If you are using the spirometer at home, follow these instructions: [place] IF:  You are having difficultly using the spirometer. You have trouble using the spirometer as often as instructed. Your pain medication is not giving enough relief while using the spirometer. You develop fever of 100.5 F (38.1 C) or higher. SEEK IMMEDIATE MEDICAL CARE IF:  You cough up bloody sputum that had not been present before. You develop fever of 102 F (38.9 C) or greater. You develop worsening pain at or near the incision site. MAKE SURE YOU:  Understand these instructions. Will watch your condition. Will get help right away if you are not doing well or get  worse. Document Released: 01/08/2007 Document Revised: 11/20/2011 Document Reviewed: 03/11/2007 Exit[employer] Patient Information 2014 [employer], [place].   ________________________________________________________________________  WHAT IS A BLOOD TRANSFUSION? Blood Transfusion Information  A transfusion is the replacement of blood or some of its parts. Blood is made up of multiple cells which provide different functions. Red blood cells carry oxygen and are used for blood loss replacement. White blood cells fight against infection. Platelets control bleeding. Plasma helps clot blood. Other blood products are available for specialized needs, such as hemophilia or other clotting disorders. BEFORE THE TRANSFUSION  Who gives blood for transfusions?  Healthy volunteers who are fully evaluated to make sure their blood is safe. This is blood bank blood. Transfusion therapy is the safest it has ever been in the practice of medicine. Before blood is taken from a donor, a complete history is taken to make sure that person has no history of diseases nor engages in risky social behavior (examples are intravenous drug use or sexual activity with multiple partners). The donor's travel history is screened to minimize risk of transmitting infections, such as malaria. The donated blood is tested for signs of infectious diseases, such as HIV and hepatitis. The blood is then tested to be sure it is compatible with you in order to minimize the chance of a transfusion reaction. If you or a relative donates blood, this is  often done in anticipation of surgery and is not appropriate for emergency situations. It takes many days to process the donated blood. RISKS AND COMPLICATIONS Although transfusion therapy is very safe and saves many lives, the main dangers of transfusion include:  Getting an infectious disease. Developing a transfusion reaction. This is an allergic reaction to something in the blood you were given. Every  precaution is taken to prevent this. The decision to have a blood transfusion has been considered carefully by your caregiver before blood is given. Blood is not given unless the benefits outweigh the risks. AFTER THE TRANSFUSION Right after receiving a blood transfusion, you will usually feel much better and more energetic. This is especially true if your red blood cells have gotten low (anemic). The transfusion raises the level of the red blood cells which carry oxygen, and this usually causes an energy increase. The nurse administering the transfusion will monitor you carefully for complications. HOME CARE INSTRUCTIONS  No special instructions are needed after a transfusion. You may find your energy is better. Speak with your caregiver about any limitations on activity for underlying diseases you may have. SEEK MEDICAL CARE IF:  Your condition is not improving after your transfusion. You develop redness or irritation at the intravenous (IV) site. SEEK IMMEDIATE MEDICAL CARE IF:  Any of the following symptoms occur over the next 12 hours: Shaking chills. You have a temperature by mouth above 102 F (38.9 C), not controlled by medicine. Chest, back, or muscle pain. People around you feel you are not acting correctly or are confused. Shortness of breath or difficulty breathing. Dizziness and fainting. You get a rash or develop hives. You have a decrease in urine output. Your urine turns a dark color or changes to pink, red, or brown. Any of the following symptoms occur over the next 10 days: You have a temperature by mouth above 102 F (38.9 C), not controlled by medicine. Shortness of breath. Weakness after normal activity. The white part of the eye turns yellow (jaundice). You have a decrease in the amount of urine or are urinating less often. Your urine turns a dark color or changes to pink, red, or brown. Document Released: 08/25/2000 Document Revised: 11/20/2011 Document Reviewed:  04/13/2008 [facility] Patient Information 2014 [place], [place].  _______________________________________________________________________

## 2021-10-12 NOTE — Progress Notes (Addendum)
COVID swab appointment: 10/24/21 @ 1200  COVID Vaccine Completed: yes x4 Date COVID Vaccine completed: 10/01/19, 10/22/19 Has received booster: 06/28/20, 05/26/21 COVID vaccine manufacturer: Pfizer    Date of COVID positive in last 90 days: no  PCP - Kathlene November, MD Cardiologist - n/a  Chest x-ray - n/a EKG - 07/11/21 Epic Stress Test - n/a ECHO - 10 years ago Cardiac Cath - n/a Pacemaker/ICD device last checked: n/a Spinal Cord Stimulator: n/a  Sleep Study - n/a CPAP -   Fasting Blood Sugar - n/a Checks Blood Sugar _____ times a day  Blood Thinner Instructions: n/a Aspirin Instructions: Last Dose:  Activity level: Can go up a flight of stairs and perform activities of daily living without stopping and without symptoms of chest pain or shortness of breath.    Anesthesia review:   Patient denies shortness of breath, fever, cough and chest pain at PAT appointment   Patient verbalized understanding of instructions that were given to them at the PAT appointment. Patient was also instructed that they will need to review over the PAT instructions again at home before surgery.

## 2021-10-13 ENCOUNTER — Other Ambulatory Visit: Payer: Self-pay

## 2021-10-13 ENCOUNTER — Encounter (HOSPITAL_COMMUNITY)
Admission: RE | Admit: 2021-10-13 | Discharge: 2021-10-13 | Disposition: A | Discharge status: Home / Self Care | Payer: Medicare Other | Source: Ambulatory Visit | Attending: Orthopedic Surgery | Admitting: Orthopedic Surgery

## 2021-10-13 ENCOUNTER — Encounter (HOSPITAL_COMMUNITY): Payer: Self-pay

## 2021-10-13 VITALS — BP 130/66 | HR 60 | Temp 98.7°F | Resp 14 | Ht 64.0 in | Wt 168.0 lb

## 2021-10-13 DIAGNOSIS — Z01818 Encounter for other preprocedural examination: Secondary | ICD-10-CM

## 2021-10-13 DIAGNOSIS — Z01812 Encounter for preprocedural laboratory examination: Secondary | ICD-10-CM | POA: Diagnosis not present

## 2021-10-13 LAB — CBC
HCT: 42.7 % (ref 36.0–46.0)
Hemoglobin: 13.7 g/dL (ref 12.0–15.0)
MCH: 31.4 pg (ref 26.0–34.0)
MCHC: 32.1 g/dL (ref 30.0–36.0)
MCV: 97.7 fL (ref 80.0–100.0)
Platelets: 296 10*3/uL (ref 150–400)
RBC: 4.37 MIL/uL (ref 3.87–5.11)
RDW: 12.9 % (ref 11.5–15.5)
WBC: 8.9 10*3/uL (ref 4.0–10.5)
nRBC: 0 % (ref 0.0–0.2)

## 2021-10-13 LAB — COMPREHENSIVE METABOLIC PANEL
ALT: 16 U/L (ref 0–44)
AST: 28 U/L (ref 15–41)
Albumin: 4.5 g/dL (ref 3.5–5.0)
Alkaline Phosphatase: 63 U/L (ref 38–126)
Anion gap: 8 (ref 5–15)
BUN: 29 mg/dL — ABNORMAL HIGH (ref 8–23)
CO2: 29 mmol/L (ref 22–32)
Calcium: 9.5 mg/dL (ref 8.9–10.3)
Chloride: 100 mmol/L (ref 98–111)
Creatinine, Ser: 1.36 mg/dL — ABNORMAL HIGH (ref 0.44–1.00)
GFR, Estimated: 39 mL/min — ABNORMAL LOW (ref >60)
Glucose, Bld: 116 mg/dL — ABNORMAL HIGH (ref 70–99)
Potassium: 4.6 mmol/L (ref 3.5–5.1)
Sodium: 137 mmol/L (ref 135–145)
Total Bilirubin: 0.3 mg/dL (ref 0.3–1.2)
Total Protein: 7 g/dL (ref 6.5–8.1)

## 2021-10-13 LAB — PROTIME-INR
INR: 0.9 (ref 0.8–1.2)
Prothrombin Time: 12.4 seconds (ref 11.4–15.2)

## 2021-10-13 LAB — TYPE AND SCREEN
ABO/RH(D): B NEG
Antibody Screen: NEGATIVE

## 2021-10-13 LAB — SURGICAL PCR SCREEN
MRSA, PCR: NEGATIVE
Staphylococcus aureus: NEGATIVE

## 2021-10-24 ENCOUNTER — Other Ambulatory Visit: Payer: Self-pay

## 2021-10-24 ENCOUNTER — Encounter (HOSPITAL_COMMUNITY)
Admission: RE | Admit: 2021-10-24 | Discharge: 2021-10-24 | Disposition: A | Discharge status: Home / Self Care | Payer: Medicare Other | Source: Ambulatory Visit | Attending: Orthopedic Surgery | Admitting: Orthopedic Surgery

## 2021-10-24 DIAGNOSIS — Z8249 Family history of ischemic heart disease and other diseases of the circulatory system: Secondary | ICD-10-CM | POA: Diagnosis not present

## 2021-10-24 DIAGNOSIS — Z96612 Presence of left artificial shoulder joint: Secondary | ICD-10-CM | POA: Diagnosis present

## 2021-10-24 DIAGNOSIS — Z20822 Contact with and (suspected) exposure to covid-19: Secondary | ICD-10-CM | POA: Insufficient documentation

## 2021-10-24 DIAGNOSIS — Z8042 Family history of malignant neoplasm of prostate: Secondary | ICD-10-CM | POA: Diagnosis not present

## 2021-10-24 DIAGNOSIS — E0590 Thyrotoxicosis, unspecified without thyrotoxic crisis or storm: Secondary | ICD-10-CM | POA: Diagnosis not present

## 2021-10-24 DIAGNOSIS — Z8371 Family history of colonic polyps: Secondary | ICD-10-CM | POA: Diagnosis not present

## 2021-10-24 DIAGNOSIS — F419 Anxiety disorder, unspecified: Secondary | ICD-10-CM | POA: Diagnosis not present

## 2021-10-24 DIAGNOSIS — I10 Essential (primary) hypertension: Secondary | ICD-10-CM | POA: Diagnosis not present

## 2021-10-24 DIAGNOSIS — Z96652 Presence of left artificial knee joint: Secondary | ICD-10-CM | POA: Diagnosis not present

## 2021-10-24 DIAGNOSIS — T84033A Mechanical loosening of internal left knee prosthetic joint, initial encounter: Secondary | ICD-10-CM | POA: Diagnosis not present

## 2021-10-24 DIAGNOSIS — E669 Obesity, unspecified: Secondary | ICD-10-CM | POA: Diagnosis present

## 2021-10-24 DIAGNOSIS — Z01818 Encounter for other preprocedural examination: Secondary | ICD-10-CM

## 2021-10-24 DIAGNOSIS — Z87891 Personal history of nicotine dependence: Secondary | ICD-10-CM | POA: Diagnosis not present

## 2021-10-24 DIAGNOSIS — T84093A Other mechanical complication of internal left knee prosthesis, initial encounter: Secondary | ICD-10-CM | POA: Diagnosis not present

## 2021-10-24 DIAGNOSIS — Z01812 Encounter for preprocedural laboratory examination: Secondary | ICD-10-CM | POA: Insufficient documentation

## 2021-10-24 DIAGNOSIS — Z6828 Body mass index (BMI) 28.0-28.9, adult: Secondary | ICD-10-CM | POA: Diagnosis not present

## 2021-10-24 DIAGNOSIS — G8918 Other acute postprocedural pain: Secondary | ICD-10-CM | POA: Diagnosis not present

## 2021-10-24 DIAGNOSIS — Z96641 Presence of right artificial hip joint: Secondary | ICD-10-CM | POA: Diagnosis present

## 2021-10-24 DIAGNOSIS — G894 Chronic pain syndrome: Secondary | ICD-10-CM | POA: Diagnosis present

## 2021-10-24 DIAGNOSIS — E039 Hypothyroidism, unspecified: Secondary | ICD-10-CM | POA: Diagnosis not present

## 2021-10-24 DIAGNOSIS — Z96651 Presence of right artificial knee joint: Secondary | ICD-10-CM | POA: Diagnosis present

## 2021-10-24 DIAGNOSIS — E7800 Pure hypercholesterolemia, unspecified: Secondary | ICD-10-CM | POA: Diagnosis present

## 2021-10-24 DIAGNOSIS — Z96611 Presence of right artificial shoulder joint: Secondary | ICD-10-CM | POA: Diagnosis present

## 2021-10-24 LAB — SARS CORONAVIRUS 2 (TAT 6-24 HRS): SARS Coronavirus 2: NEGATIVE

## 2021-10-24 NOTE — H&P (Signed)
TOTAL KNEE REVISION ADMISSION H&P  Patient is being admitted for left total knee arthroplasty revision.  Subjective:  Chief Complaint: Left knee pain.  HPI: [name], 81 y.o. female, who has a history of two knee replacements for the left knee. The last one was done by Dr. Wynelle Link. She has been going to the gym to stay active but has been having some pain afterwards. She rates her pain level a 7/10 today. Her pain is constant, she has some stiffness in the knee and hip from sitting for long periods. She has stopped with exercise because she is not sure what to do without causing pain. She has been taking gabapentin, and hydrocodone to help with her pain. Does get some cramping in the hip. She also gets pain down the leg. Has some point tenderness along the front of the knee.   [name] is a pleasant 81 year old female who comes to the clinic for follow-up of left hip and left knee pain. The patient reports that her symptoms are unchanged from her last visit. She states that she goes to the [employer] 2 times per week and did some of the leg exercisesincluding resisted hip abduction which in turn led to lateral hip pain. She notes that for the next 2 days, she had to use a cane for ambulation. She localizes the pain to the lateral aspect of the hip. She states that she was doing the leg exercises when she felt a sharp pain in the lateral aspect of the hip. She notes that her left knee still feels unstable and she has a sore place on the lateral aspect of the knee. She states that she has not had fluid drained from the knee in the past. She notes that her first aspiration was at least 10 to 15 years ago and was before the replacement. She states that she would not like to have any more surgery. She notes that she has been walking on the treadmill and riding a bicycle at the gym.  Patient Active Problem List   Diagnosis Date Noted   Insomnia 11/30/2020   Graves' disease 03/27/2020    Neuropathy 12/05/2017   Hyperthyroidism 01/27/2017   Chronic pain syndrome 10/24/2016   High cholesterol 10/24/2016   Lumbar post-laminectomy syndrome 10/04/2015   Chronic lumbar radiculopathy 10/04/2015   PCP NOTES >>>>> 08/07/2015   Allergic rhinitis 01/21/2015   Leg edema 12/16/2013   Weight loss 08/29/2012   Lumbar canal stenosis 08/20/2012   Annual physical exam 11/03/2011   Heart murmur 11/03/2011   THYROID NODULE 03/15/2010   PERSONAL HX COLONIC POLYPS 04/30/2008   FATIGUE 04/17/2008   Anxiety state 01/27/2008   Osteoarthritis 01/27/2008   BAKER'S CYST 12/20/2006   Essential hypertension 09/11/2005    Past Medical History:  Diagnosis Date   Adenomatous colon polyp 03/2003   Anxiety    ativan for sleep   Baker's cyst    Degenerative lumbar disc    Dr. Nelva Bush, epidural injections   Goiter    History of elevated homocysteine    Hypertension 09/11/2005   Lumbar radiculopathy    Dr. Nelva Bush, epidureal injections   Lumbosacral radiculopathy    seeing Pain Management   OA (osteoarthritis)     knee pain after TKR left , back pain- last steroid injection back 2 months ago Dr Nelva Bush   PONV (postoperative nausea and vomiting)    Postlaminectomy syndrome, lumbar region    Dr. Nelva Bush, recommended doing bilateral S1 transforaminal epidural with D5W  Thyroid nodule    rt benign colloid nodule--left hyperplastic nodule- Thyroid bx 2006: neg    Past Surgical History:  Procedure Laterality Date   ABDOMINAL HYSTERECTOMY     BACK SURGERY  11-09-2011   at [facility], lumbar   bakers cystectomy     right knee   FACELIFT     INCISIONAL HERNIA REPAIR     KNEE ARTHROSCOPY     L   replacement 2007, Dr Corinne Ports, then a manipulation, then redo TKR 11/09 w/ Dr.Alusio. Parcial replacement again 09-2010   KNEE SURGERY     x 5-  left   OOPHORECTOMY     SHOULDER SURGERY     replacements: L 2010, R 2012   TOTAL HIP ARTHROPLASTY  3/11   Right    TOTAL KNEE ARTHROPLASTY  05/20/2012   Procedure:  TOTAL KNEE ARTHROPLASTY;  Surgeon: Gearlean Alf, MD;  Location: [facility];  Service: Orthopedics;  Laterality: Right;    Prior to Admission medications   Medication Sig Start Date End Date Taking? Authorizing Provider  azelastine (ASTELIN) 0.1 % nasal spray Place 2 sprays into both nostrils at bedtime as needed for rhinitis or allergies. 07/16/20  Yes Paz, Alda Berthold, MD  Calcium Carbonate-Vitamin D (CALCIUM + D PO) Take 1 tablet by mouth daily.   Yes [provider]  ezetimibe (ZETIA) 10 MG tablet Take 1 tablet (10 mg total) by mouth daily. 07/12/21  Yes Paz, Alda Berthold, MD  gabapentin (NEURONTIN) 800 MG tablet TAKE 1 TABLET(800 MG) BY MOUTH FOUR TIMES DAILY 07/24/16  Yes Kirsteins, Luanna Salk, MD  HYDROcodone-acetaminophen (NORCO) 10-325 MG tablet Take 1 tablet by mouth 3 (three) times daily as needed for moderate pain.   Yes [provider]  Lidocaine 4 % PTCH Apply 1 patch topically daily as needed (pain).   Yes [provider]  lisinopril-hydrochlorothiazide (ZESTORETIC) 20-12.5 MG tablet Take 1 tablet by mouth daily. 05/18/21  Yes Paz, Alda Berthold, MD  MELATONIN PO Take 1 capsule by mouth at bedtime as needed (sleep).   Yes [provider]  pravastatin (PRAVACHOL) 40 MG tablet TAKE 1 TABLET BY MOUTH EVERYDAY AT BEDTIME 04/25/21  Yes Paz, Alda Berthold, MD  zolpidem (AMBIEN) 5 MG tablet TAKE 1 TABLET BY MOUTH AT BEDTIME AS NEEDED FOR SLEEP 10/06/21  Yes Colon Branch, MD    No Known Allergies  Social History   Socioeconomic History   Marital status: Married    Spouse name: Not on file   Number of children: 2   Years of education: Not on file   Highest education level: Not on file  Occupational History   Occupation: Retired Pharmacist, hospital    Comment: retired    Fish farm manager: RETIRED  Tobacco Use   Smoking status: Former    Types: Cigarettes    Quit date: 09/12/1983    Years since quitting: 38.1   Smokeless tobacco: Never  Vaping Use   Vaping Use: Never used  Substance and Sexual  Activity   Alcohol use: Yes    Alcohol/week: 7.0 standard drinks    Types: 7 Glasses of wine per week    Comment: occasional    Drug use: No   Sexual activity: Not on file  Other Topics Concern   Not on file  Social History Narrative   Lives by herself in a one story house     husband at a ALF/NHf 57 years in a 3 story home.     Retired Education officer, museum.  Education:  college.         Social Determinants of Health   Financial Resource Strain: Not on file  Food Insecurity: Not on file  Transportation Needs: Not on file  Physical Activity: Not on file  Stress: Not on file  Social Connections: Not on file  Intimate Partner Violence: Not on file    Tobacco Use: Medium Risk   Smoking Tobacco Use: Former   Smokeless Tobacco Use: Never   Passive Exposure: Not on file   Social History   Substance and Sexual Activity  Alcohol Use Yes   Alcohol/week: 7.0 standard drinks   Types: 7 Glasses of wine per week   Comment: occasional     Family History  Problem Relation Age of Onset   Coronary artery disease Father        CABG at 39   Prostate cancer Father    Colon polyps Father    Coronary artery disease Brother    Colon polyps Brother    Breast cancer Neg Hx    Colon cancer Neg Hx    Pancreatic cancer Neg Hx    Stomach cancer Neg Hx    Thyroid disease Neg Hx     ROS: Constitutional: no fever, no chills, no night sweats, no significant weight loss Cardiovascular: no chest pain, no palpitations Respiratory: no cough, no shortness of breath, No COPD Gastrointestinal: no vomiting, no nausea Musculoskeletal: no swelling in Joints, Joint Pain Neurologic: no numbness, no tingling, no difficulty with balance   Objective:  Physical Exam: Well nourished and well developed.  General: Alert and oriented x3, cooperative and pleasant, no acute distress.  Head: normocephalic, atraumatic, neck supple.  Eyes: EOMI.  Respiratory: breath sounds clear in all fields, no wheezing,  rales, or rhonchi. Cardiovascular: Regular rate and rhythm, no murmurs, gallops or rubs.  Abdomen: non-tender to palpation and soft, normoactive bowel sounds. Musculoskeletal:  The patient has an antalgic gait pattern favoring the left side.     Left Hip Exam:   The range of motion: Flexion to 110 degrees, Internal Rotation to 20 degrees, External Rotation to 30 degrees, and abduction to 30 degrees without discomfort.   There is positive tenderness over the greater trochanteric bursa. Palpation reproduces the pain that she is experiencing.   There is no pain on provocative testing of the hip.     Left Knee Exam:   Trace effusion present. No swelling present.   The range of motion is: 0 to 125 degrees.   No crepitus on range of motion of the knee.   Positive anteromedial tenderness just above the joint line.   Fair amount of varus/valgus laxity.   No gross instability.   The knee is stable.  Calves soft and nontender. Motor function intact in LE. Strength 5/5 LE bilaterally. Neuro: Distal pulses 2+. Sensation to light touch intact in LE.   Vital signs in last 24 hours:    Imaging Review Radiographs- AP pelvis, AP and lateral of the left hip dated 04/21/2021 demonstrate no evidence of any significant arthritis, acute or chronic bony abnormalities. Her right hip prosthesis is in excellent position with no abnormalities.     Radiographs- AP and lateral of the left knee dated 04/21/2021 demonstrate a revision prosthesis. The tibia component looks perfect with no signs of any loosening. I am a little concerned about the femoral stem as there is some lucency around the stem and some changes in the medial cortex at the tip of the stem consistent with possible  loosening.   Bone Scan - Revealed findings concerning for loosening of the femoral component.   Assessment/Plan:  Assessment: Failed Left TKA  Plan:  I am concerned about possible loosening of the femoral stem. She had a bone scan  in 02/2020 that did not show any definitive loosening, but I feel like things have gotten worse on the plain film also worse clinically.Bone scan revealed findings concerning for loosening of the femoral component. Since it is loose, she will need a femoral revision versus a total knee revision.   Therapy Plans: [facility] PT Disposition: Home with Daughter in [place] DVT Prophylaxis: Aspirin 325mg  DME Needed: RW PCP: Kathlene November, MD (clearance received) TXA: IV Allergies: None Anesthesia Concerns: None BMI: 30.6 Last HgbA1c: N/A  [facility]  - Patient was instructed on what medications to stop prior to surgery. - Follow-up visit in 2 weeks with Dr. Wynelle Link - Begin physical therapy following surgery - Pre-operative lab work as pre-surgical testing - Prescriptions will be provided in hospital at time of discharge  Fenton Foy, [facility], PA-C Orthopedic Surgery [facility] [place]

## 2021-10-25 NOTE — Anesthesia Preprocedure Evaluation (Addendum)
Anesthesia Evaluation  Patient identified by MRN, date of birth, ID band Patient awake    Reviewed: Allergy & Precautions, NPO status , Patient's Chart, lab work & pertinent test results  History of Anesthesia Complications (+) PONV and history of anesthetic complications  Airway Mallampati: II  TM Distance: >3 FB Neck ROM: Full    Dental no notable dental hx. (+) Dental Advisory Given   Pulmonary neg pulmonary ROS, former smoker,    Pulmonary exam normal        Cardiovascular hypertension, Pt. on medications Normal cardiovascular exam     Neuro/Psych Anxiety negative neurological ROS     GI/Hepatic negative GI ROS, Neg liver ROS,   Endo/Other  Hyperthyroidism   Renal/GU negative Renal ROS     Musculoskeletal negative musculoskeletal ROS (+)   Abdominal   Peds  Hematology negative hematology ROS (+)   Anesthesia Other Findings   Reproductive/Obstetrics                            Anesthesia Physical Anesthesia Plan  ASA: 3  Anesthesia Plan: Spinal and Regional   Post-op Pain Management: Celebrex PO (pre-op)*, Ofirmev IV (intra-op)* and Regional block*   Induction:   PONV Risk Score and Plan: 3 and Ondansetron, Dexamethasone and Propofol infusion  Airway Management Planned: Natural Airway, Simple Face Mask and Nasal Cannula  Additional Equipment:   Intra-op Plan:   Post-operative Plan: Extubation in OR  Informed Consent: I have reviewed the patients History and Physical, chart, labs and discussed the procedure including the risks, benefits and alternatives for the proposed anesthesia with the patient or authorized representative who has indicated his/her understanding and acceptance.     Dental advisory given  Plan Discussed with: Anesthesiologist and CRNA  Anesthesia Plan Comments:       Anesthesia Quick Evaluation

## 2021-10-26 ENCOUNTER — Other Ambulatory Visit: Payer: Self-pay

## 2021-10-26 ENCOUNTER — Encounter (HOSPITAL_COMMUNITY): Payer: Self-pay | Admitting: Orthopedic Surgery

## 2021-10-26 ENCOUNTER — Inpatient Hospital Stay (HOSPITAL_COMMUNITY): Payer: Medicare Other | Admitting: Anesthesiology

## 2021-10-26 ENCOUNTER — Inpatient Hospital Stay (HOSPITAL_COMMUNITY)
Admission: RE | Admit: 2021-10-26 | Discharge: 2021-10-28 | DRG: 465 | Disposition: A | Discharge status: Home / Self Care | Payer: Medicare Other | Source: Ambulatory Visit | Attending: Orthopedic Surgery | Admitting: Orthopedic Surgery

## 2021-10-26 ENCOUNTER — Encounter (HOSPITAL_COMMUNITY)
Admission: RE | Disposition: A | Discharge status: Home / Self Care | Payer: Self-pay | Source: Ambulatory Visit | Attending: Orthopedic Surgery

## 2021-10-26 DIAGNOSIS — Z96611 Presence of right artificial shoulder joint: Secondary | ICD-10-CM | POA: Diagnosis present

## 2021-10-26 DIAGNOSIS — E039 Hypothyroidism, unspecified: Secondary | ICD-10-CM | POA: Diagnosis not present

## 2021-10-26 DIAGNOSIS — E7800 Pure hypercholesterolemia, unspecified: Secondary | ICD-10-CM | POA: Diagnosis present

## 2021-10-26 DIAGNOSIS — Z96652 Presence of left artificial knee joint: Secondary | ICD-10-CM | POA: Diagnosis not present

## 2021-10-26 DIAGNOSIS — Z87891 Personal history of nicotine dependence: Secondary | ICD-10-CM | POA: Diagnosis not present

## 2021-10-26 DIAGNOSIS — E669 Obesity, unspecified: Secondary | ICD-10-CM | POA: Diagnosis present

## 2021-10-26 DIAGNOSIS — Z8249 Family history of ischemic heart disease and other diseases of the circulatory system: Secondary | ICD-10-CM | POA: Diagnosis not present

## 2021-10-26 DIAGNOSIS — T84033A Mechanical loosening of internal left knee prosthetic joint, initial encounter: Principal | ICD-10-CM | POA: Diagnosis present

## 2021-10-26 DIAGNOSIS — I10 Essential (primary) hypertension: Secondary | ICD-10-CM | POA: Diagnosis present

## 2021-10-26 DIAGNOSIS — E0590 Thyrotoxicosis, unspecified without thyrotoxic crisis or storm: Secondary | ICD-10-CM

## 2021-10-26 DIAGNOSIS — Z96651 Presence of right artificial knee joint: Secondary | ICD-10-CM | POA: Diagnosis present

## 2021-10-26 DIAGNOSIS — Z20822 Contact with and (suspected) exposure to covid-19: Secondary | ICD-10-CM | POA: Diagnosis present

## 2021-10-26 DIAGNOSIS — Z96612 Presence of left artificial shoulder joint: Secondary | ICD-10-CM | POA: Diagnosis present

## 2021-10-26 DIAGNOSIS — G8918 Other acute postprocedural pain: Secondary | ICD-10-CM | POA: Diagnosis not present

## 2021-10-26 DIAGNOSIS — F419 Anxiety disorder, unspecified: Secondary | ICD-10-CM | POA: Diagnosis not present

## 2021-10-26 DIAGNOSIS — G894 Chronic pain syndrome: Secondary | ICD-10-CM | POA: Diagnosis present

## 2021-10-26 DIAGNOSIS — Z8042 Family history of malignant neoplasm of prostate: Secondary | ICD-10-CM | POA: Diagnosis not present

## 2021-10-26 DIAGNOSIS — T84093A Other mechanical complication of internal left knee prosthesis, initial encounter: Secondary | ICD-10-CM

## 2021-10-26 DIAGNOSIS — Z6828 Body mass index (BMI) 28.0-28.9, adult: Secondary | ICD-10-CM | POA: Diagnosis not present

## 2021-10-26 DIAGNOSIS — Z96641 Presence of right artificial hip joint: Secondary | ICD-10-CM | POA: Diagnosis present

## 2021-10-26 DIAGNOSIS — Z8371 Family history of colonic polyps: Secondary | ICD-10-CM | POA: Diagnosis not present

## 2021-10-26 DIAGNOSIS — Z96659 Presence of unspecified artificial knee joint: Secondary | ICD-10-CM

## 2021-10-26 DIAGNOSIS — T84018A Broken internal joint prosthesis, other site, initial encounter: Secondary | ICD-10-CM

## 2021-10-26 HISTORY — PX: TOTAL KNEE REVISION: SHX996

## 2021-10-26 SURGERY — TOTAL KNEE REVISION
Anesthesia: Regional | Site: Knee | Laterality: Left

## 2021-10-26 MED ORDER — PROMETHAZINE HCL 25 MG/ML IJ SOLN: 6.2500 mg | INTRAMUSCULAR | Status: DC | PRN

## 2021-10-26 MED ORDER — PROPOFOL 10 MG/ML IV BOLUS
INTRAVENOUS | Status: DC | PRN
  Administered 2021-10-26: 30 mg via INTRAVENOUS

## 2021-10-26 MED ORDER — DIPHENHYDRAMINE HCL 12.5 MG/5ML PO ELIX: 12.5000 mg | ORAL_SOLUTION | ORAL | Status: DC | PRN

## 2021-10-26 MED ORDER — OXYCODONE HCL 5 MG PO TABS
5.0000 mg | ORAL_TABLET | ORAL | Status: DC | PRN
  Administered 2021-10-26 – 2021-10-28 (×9): 10 mg via ORAL
  Filled 2021-10-26 (×9): qty 2

## 2021-10-26 MED ORDER — EPHEDRINE SULFATE-NACL 50-0.9 MG/10ML-% IV SOSY
PREFILLED_SYRINGE | INTRAVENOUS | Status: DC | PRN
  Administered 2021-10-26 (×2): 5 mg via INTRAVENOUS
  Administered 2021-10-26: 10 mg via INTRAVENOUS
  Administered 2021-10-26: 5 mg via INTRAVENOUS

## 2021-10-26 MED ORDER — AMISULPRIDE (ANTIEMETIC) 5 MG/2ML IV SOLN: 10.0000 mg | Freq: Once | INTRAVENOUS | Status: DC | PRN

## 2021-10-26 MED ORDER — SODIUM CHLORIDE (PF) 0.9 % IJ SOLN
INTRAMUSCULAR | Status: DC | PRN
  Administered 2021-10-26: 60 mL

## 2021-10-26 MED ORDER — METOCLOPRAMIDE HCL 5 MG/ML IJ SOLN: 5.0000 mg | Freq: Three times a day (TID) | INTRAMUSCULAR | Status: DC | PRN

## 2021-10-26 MED ORDER — DOCUSATE SODIUM 100 MG PO CAPS
100.0000 mg | ORAL_CAPSULE | Freq: Two times a day (BID) | ORAL | Status: DC
  Administered 2021-10-26 – 2021-10-28 (×4): 100 mg via ORAL
  Filled 2021-10-26 (×4): qty 1

## 2021-10-26 MED ORDER — PHENYLEPHRINE 40 MCG/ML (10ML) SYRINGE FOR IV PUSH (FOR BLOOD PRESSURE SUPPORT)
PREFILLED_SYRINGE | INTRAVENOUS | Status: DC | PRN
  Administered 2021-10-26: 80 ug via INTRAVENOUS

## 2021-10-26 MED ORDER — FENTANYL CITRATE PF 50 MCG/ML IJ SOSY: 25.0000 ug | PREFILLED_SYRINGE | INTRAMUSCULAR | Status: DC | PRN

## 2021-10-26 MED ORDER — FENTANYL CITRATE (PF) 100 MCG/2ML IJ SOLN
INTRAMUSCULAR | Status: DC | PRN
  Administered 2021-10-26 (×2): 50 ug via INTRAVENOUS

## 2021-10-26 MED ORDER — CEFAZOLIN SODIUM-DEXTROSE 2-4 GM/100ML-% IV SOLN
2.0000 g | Freq: Four times a day (QID) | INTRAVENOUS | Status: AC
  Administered 2021-10-26 (×2): 2 g via INTRAVENOUS
  Filled 2021-10-26 (×2): qty 100

## 2021-10-26 MED ORDER — DEXAMETHASONE SODIUM PHOSPHATE 10 MG/ML IJ SOLN
10.0000 mg | Freq: Once | INTRAMUSCULAR | Status: AC
  Administered 2021-10-27: 10 mg via INTRAVENOUS
  Filled 2021-10-26: qty 1

## 2021-10-26 MED ORDER — ROPIVACAINE HCL 7.5 MG/ML IJ SOLN
INTRAMUSCULAR | Status: DC | PRN
Start: 2021-10-26 — End: 2021-10-26
  Administered 2021-10-26: 20 mL via PERINEURAL

## 2021-10-26 MED ORDER — MENTHOL 3 MG MT LOZG: 1.0000 | LOZENGE | OROMUCOSAL | Status: DC | PRN

## 2021-10-26 MED ORDER — PHENYLEPHRINE HCL-NACL 20-0.9 MG/250ML-% IV SOLN
INTRAVENOUS | Status: DC | PRN
  Administered 2021-10-26: 40 ug/min via INTRAVENOUS

## 2021-10-26 MED ORDER — SODIUM CHLORIDE (PF) 0.9 % IJ SOLN
INTRAMUSCULAR | Status: AC
  Filled 2021-10-26: qty 10

## 2021-10-26 MED ORDER — 0.9 % SODIUM CHLORIDE (POUR BTL) OPTIME
TOPICAL | Status: DC | PRN
  Administered 2021-10-26: 1000 mL

## 2021-10-26 MED ORDER — POVIDONE-IODINE 10 % EX SWAB
2.0000 "application " | Freq: Once | CUTANEOUS | Status: AC
  Administered 2021-10-26: 2 via TOPICAL

## 2021-10-26 MED ORDER — BUPIVACAINE IN DEXTROSE 0.75-8.25 % IT SOLN
INTRATHECAL | Status: DC | PRN
Start: 2021-10-26 — End: 2021-10-26
  Administered 2021-10-26: 1.8 mL via INTRATHECAL

## 2021-10-26 MED ORDER — TRAMADOL HCL 50 MG PO TABS
50.0000 mg | ORAL_TABLET | Freq: Four times a day (QID) | ORAL | Status: DC | PRN
  Administered 2021-10-28: 50 mg via ORAL
  Filled 2021-10-26: qty 1

## 2021-10-26 MED ORDER — PROPOFOL 1000 MG/100ML IV EMUL
INTRAVENOUS | Status: AC
  Filled 2021-10-26: qty 100

## 2021-10-26 MED ORDER — DEXAMETHASONE SODIUM PHOSPHATE 10 MG/ML IJ SOLN
INTRAMUSCULAR | Status: DC | PRN
  Administered 2021-10-26: 5 mg

## 2021-10-26 MED ORDER — DEXAMETHASONE SODIUM PHOSPHATE 10 MG/ML IJ SOLN
INTRAMUSCULAR | Status: AC
  Filled 2021-10-26: qty 1

## 2021-10-26 MED ORDER — SODIUM CHLORIDE 0.9 % IV SOLN: INTRAVENOUS | Status: DC

## 2021-10-26 MED ORDER — ONDANSETRON HCL 4 MG/2ML IJ SOLN
INTRAMUSCULAR | Status: DC | PRN
  Administered 2021-10-26: 4 mg via INTRAVENOUS

## 2021-10-26 MED ORDER — EZETIMIBE 10 MG PO TABS
10.0000 mg | ORAL_TABLET | Freq: Every day | ORAL | Status: DC
  Administered 2021-10-27 – 2021-10-28 (×2): 10 mg via ORAL
  Filled 2021-10-26 (×2): qty 1

## 2021-10-26 MED ORDER — GABAPENTIN 400 MG PO CAPS
800.0000 mg | ORAL_CAPSULE | Freq: Four times a day (QID) | ORAL | Status: DC
  Administered 2021-10-26 – 2021-10-28 (×8): 800 mg via ORAL
  Filled 2021-10-26 (×8): qty 2

## 2021-10-26 MED ORDER — LACTATED RINGERS IV SOLN: INTRAVENOUS | Status: DC

## 2021-10-26 MED ORDER — CEFAZOLIN SODIUM-DEXTROSE 2-4 GM/100ML-% IV SOLN
2.0000 g | INTRAVENOUS | Status: AC
  Administered 2021-10-26: 2 g via INTRAVENOUS
  Filled 2021-10-26: qty 100

## 2021-10-26 MED ORDER — SODIUM CHLORIDE 0.9 % IR SOLN
Status: DC | PRN
  Administered 2021-10-26: 1000 mL

## 2021-10-26 MED ORDER — METHOCARBAMOL 500 MG IVPB - SIMPLE MED
500.0000 mg | Freq: Four times a day (QID) | INTRAVENOUS | Status: DC | PRN
  Filled 2021-10-26: qty 50

## 2021-10-26 MED ORDER — POLYETHYLENE GLYCOL 3350 17 G PO PACK: 17.0000 g | PACK | Freq: Every day | ORAL | Status: DC | PRN

## 2021-10-26 MED ORDER — ASPIRIN EC 325 MG PO TBEC
325.0000 mg | DELAYED_RELEASE_TABLET | Freq: Two times a day (BID) | ORAL | Status: DC
  Administered 2021-10-27 – 2021-10-28 (×3): 325 mg via ORAL
  Filled 2021-10-26 (×3): qty 1

## 2021-10-26 MED ORDER — ACETAMINOPHEN 10 MG/ML IV SOLN
1000.0000 mg | Freq: Four times a day (QID) | INTRAVENOUS | Status: DC
  Administered 2021-10-26: 1000 mg via INTRAVENOUS
  Filled 2021-10-26: qty 100

## 2021-10-26 MED ORDER — ACETAMINOPHEN 500 MG PO TABS
1000.0000 mg | ORAL_TABLET | Freq: Four times a day (QID) | ORAL | Status: AC
  Administered 2021-10-26 – 2021-10-27 (×3): 1000 mg via ORAL
  Filled 2021-10-26 (×3): qty 2

## 2021-10-26 MED ORDER — METOCLOPRAMIDE HCL 5 MG PO TABS: 5.0000 mg | ORAL_TABLET | Freq: Three times a day (TID) | ORAL | Status: DC | PRN

## 2021-10-26 MED ORDER — MIDAZOLAM HCL 2 MG/2ML IJ SOLN
INTRAMUSCULAR | Status: AC
  Filled 2021-10-26: qty 2

## 2021-10-26 MED ORDER — MIDAZOLAM HCL 5 MG/5ML IJ SOLN
INTRAMUSCULAR | Status: DC | PRN
  Administered 2021-10-26: .5 mg via INTRAVENOUS
  Administered 2021-10-26: 1 mg via INTRAVENOUS
  Administered 2021-10-26: .5 mg via INTRAVENOUS

## 2021-10-26 MED ORDER — PHENYLEPHRINE HCL (PRESSORS) 10 MG/ML IV SOLN
INTRAVENOUS | Status: AC
  Filled 2021-10-26: qty 1

## 2021-10-26 MED ORDER — PROPOFOL 500 MG/50ML IV EMUL
INTRAVENOUS | Status: DC | PRN
  Administered 2021-10-26: 100 ug/kg/min via INTRAVENOUS

## 2021-10-26 MED ORDER — FENTANYL CITRATE (PF) 100 MCG/2ML IJ SOLN
INTRAMUSCULAR | Status: AC
  Filled 2021-10-26: qty 2

## 2021-10-26 MED ORDER — METHOCARBAMOL 500 MG PO TABS
500.0000 mg | ORAL_TABLET | Freq: Four times a day (QID) | ORAL | Status: DC | PRN
  Administered 2021-10-26 – 2021-10-27 (×3): 500 mg via ORAL
  Filled 2021-10-26 (×4): qty 1

## 2021-10-26 MED ORDER — BUPIVACAINE LIPOSOME 1.3 % IJ SUSP
INTRAMUSCULAR | Status: DC | PRN
  Administered 2021-10-26: 20 mL

## 2021-10-26 MED ORDER — EPHEDRINE 5 MG/ML INJ
INTRAVENOUS | Status: AC
  Filled 2021-10-26: qty 5

## 2021-10-26 MED ORDER — BUPIVACAINE LIPOSOME 1.3 % IJ SUSP
INTRAMUSCULAR | Status: AC
  Filled 2021-10-26: qty 20

## 2021-10-26 MED ORDER — PRAVASTATIN SODIUM 20 MG PO TABS
40.0000 mg | ORAL_TABLET | Freq: Every day | ORAL | Status: DC
Start: 2021-10-27 — End: 2021-10-28
  Administered 2021-10-27 – 2021-10-28 (×2): 40 mg via ORAL
  Filled 2021-10-26 (×2): qty 2

## 2021-10-26 MED ORDER — BISACODYL 10 MG RE SUPP: 10.0000 mg | Freq: Every day | RECTAL | Status: DC | PRN

## 2021-10-26 MED ORDER — ORAL CARE MOUTH RINSE: 15.0000 mL | Freq: Once | OROMUCOSAL | Status: AC

## 2021-10-26 MED ORDER — ONDANSETRON HCL 4 MG/2ML IJ SOLN
INTRAMUSCULAR | Status: AC
  Filled 2021-10-26: qty 2

## 2021-10-26 MED ORDER — CHLORHEXIDINE GLUCONATE 0.12 % MT SOLN
15.0000 mL | Freq: Once | OROMUCOSAL | Status: AC
  Administered 2021-10-26: 15 mL via OROMUCOSAL

## 2021-10-26 MED ORDER — DEXAMETHASONE SODIUM PHOSPHATE 10 MG/ML IJ SOLN: 8.0000 mg | Freq: Once | INTRAMUSCULAR | Status: DC

## 2021-10-26 MED ORDER — BUPIVACAINE LIPOSOME 1.3 % IJ SUSP: 20.0000 mL | Freq: Once | INTRAMUSCULAR | Status: DC

## 2021-10-26 MED ORDER — ONDANSETRON HCL 4 MG PO TABS: 4.0000 mg | ORAL_TABLET | Freq: Four times a day (QID) | ORAL | Status: DC | PRN

## 2021-10-26 MED ORDER — ONDANSETRON HCL 4 MG/2ML IJ SOLN: 4.0000 mg | Freq: Four times a day (QID) | INTRAMUSCULAR | Status: DC | PRN

## 2021-10-26 MED ORDER — FLEET ENEMA 7-19 GM/118ML RE ENEM: 1.0000 | ENEMA | Freq: Once | RECTAL | Status: DC | PRN

## 2021-10-26 MED ORDER — MORPHINE SULFATE (PF) 2 MG/ML IV SOLN
0.5000 mg | INTRAVENOUS | Status: DC | PRN
  Administered 2021-10-26: 1 mg via INTRAVENOUS
  Filled 2021-10-26: qty 1

## 2021-10-26 MED ORDER — STERILE WATER FOR IRRIGATION IR SOLN
Status: DC | PRN
  Administered 2021-10-26: 2000 mL

## 2021-10-26 MED ORDER — TRANEXAMIC ACID-NACL 1000-0.7 MG/100ML-% IV SOLN
1000.0000 mg | INTRAVENOUS | Status: AC
  Administered 2021-10-26: 1000 mg via INTRAVENOUS
  Filled 2021-10-26: qty 100

## 2021-10-26 MED ORDER — PHENOL 1.4 % MT LIQD: 1.0000 | OROMUCOSAL | Status: DC | PRN

## 2021-10-26 SURGICAL SUPPLY — 78 items
ADAPTER BOLT FEMORAL +2/-2 (Knees) ×1 IMPLANT
ADPR FEM +2/-2 OFST BOLT (Knees) ×1 IMPLANT
ADPR FEM 5D STRL KN PFC SGM (Orthopedic Implant) ×1 IMPLANT
AUG FEM SZ3 8 CMB POST STRL LF (Knees) ×2 IMPLANT
AUG FEM SZ3 8 DIST STRL KN LT (Knees) ×2 IMPLANT
AUGMENT DIST PFC 8MM (Knees) IMPLANT
BAG COUNTER SPONGE SURGICOUNT (BAG) IMPLANT
BAG DECANTER FOR FLEXI CONT (MISCELLANEOUS) ×2 IMPLANT
BAG SPEC THK2 15X12 ZIP CLS (MISCELLANEOUS)
BAG SPNG CNTER NS LX DISP (BAG)
BAG ZIPLOCK 12X15 (MISCELLANEOUS) IMPLANT
BLADE SAG 18X100X1.27 (BLADE) ×2 IMPLANT
BLADE SAW SGTL 11.0X1.19X90.0M (BLADE) ×2 IMPLANT
BLADE SURG SZ10 CARB STEEL (BLADE) ×4 IMPLANT
BNDG CMPR 82X61 PLY HI ABS (GAUZE/BANDAGES/DRESSINGS) ×1
BNDG CMPR MED 10X6 ELC LF (GAUZE/BANDAGES/DRESSINGS) ×1
BNDG CONFORM 6X.82 1P STRL (GAUZE/BANDAGES/DRESSINGS) ×1 IMPLANT
BNDG ELASTIC 6X10 VLCR STRL LF (GAUZE/BANDAGES/DRESSINGS) ×1 IMPLANT
BNDG ELASTIC 6X5.8 VLCR STR LF (GAUZE/BANDAGES/DRESSINGS) ×2 IMPLANT
BONE CEMENT GENTAMICIN (Cement) ×4 IMPLANT
CEMENT BONE GENTAMICIN 40 (Cement) ×3 IMPLANT
CLOTH BEACON ORANGE TIMEOUT ST (SAFETY) ×2 IMPLANT
COVER SURGICAL LIGHT HANDLE (MISCELLANEOUS) ×2 IMPLANT
CUFF TOURN SGL QUICK 34 (TOURNIQUET CUFF) ×2
CUFF TRNQT CYL 34X4.125X (TOURNIQUET CUFF) ×1 IMPLANT
DISAL AUG PFC 8MM (Knees) ×4 IMPLANT
DRAPE INCISE IOBAN 66X45 STRL (DRAPES) ×2 IMPLANT
DRAPE U-SHAPE 47X51 STRL (DRAPES) ×2 IMPLANT
DRSG ADAPTIC 3X8 NADH LF (GAUZE/BANDAGES/DRESSINGS) ×2 IMPLANT
DRSG PAD ABDOMINAL 8X10 ST (GAUZE/BANDAGES/DRESSINGS) ×2 IMPLANT
DURAPREP 26ML APPLICATOR (WOUND CARE) ×2 IMPLANT
ELECT REM PT RETURN 15FT ADLT (MISCELLANEOUS) ×2 IMPLANT
EVACUATOR 1/8 PVC DRAIN (DRAIN) ×2 IMPLANT
FEM TC3 PFC SZ3 LEFT (Orthopedic Implant) ×2 IMPLANT
FEMORAL ADAPTER (Orthopedic Implant) ×1 IMPLANT
FEMORAL TC3 PFC SZ3 LEFT (Orthopedic Implant) IMPLANT
GAUZE SPONGE 4X4 12PLY STRL (GAUZE/BANDAGES/DRESSINGS) ×2 IMPLANT
GLOVE SRG 8 PF TXTR STRL LF DI (GLOVE) ×1 IMPLANT
GLOVE SURG ENC MOIS LTX SZ6.5 (GLOVE) ×4 IMPLANT
GLOVE SURG ENC MOIS LTX SZ8 (GLOVE) ×4 IMPLANT
GLOVE SURG UNDER POLY LF SZ7 (GLOVE) ×2 IMPLANT
GLOVE SURG UNDER POLY LF SZ8 (GLOVE) ×2
GLOVE SURG UNDER POLY LF SZ8.5 (GLOVE) IMPLANT
GOWN STRL REUS W/TWL LRG LVL3 (GOWN DISPOSABLE) ×4 IMPLANT
HANDPIECE INTERPULSE COAX TIP (DISPOSABLE) ×2
HOLDER FOLEY CATH W/STRAP (MISCELLANEOUS) IMPLANT
IMMOBILIZER KNEE 20 (SOFTGOODS) ×2
IMMOBILIZER KNEE 20 THIGH 36 (SOFTGOODS) ×1 IMPLANT
INSERT SZ3 TC3 25MM (Knees) ×1 IMPLANT
KIT TURNOVER KIT A (KITS) IMPLANT
MANIFOLD NEPTUNE II (INSTRUMENTS) ×2 IMPLANT
NS IRRIG 1000ML POUR BTL (IV SOLUTION) ×2 IMPLANT
PACK TOTAL KNEE CUSTOM (KITS) ×2 IMPLANT
PADDING CAST COTTON 6X4 STRL (CAST SUPPLIES) ×4 IMPLANT
PIN STEINMAN 9IN X 3/32 (Pin) ×1 IMPLANT
POST AVE PFC 8MM (Knees) ×2 IMPLANT
PROTECTOR NERVE ULNAR (MISCELLANEOUS) ×2 IMPLANT
SET HNDPC FAN SPRY TIP SCT (DISPOSABLE) ×1 IMPLANT
SLEEVE UNIV FEM DIST PRO SZ 31 (Sleeve) ×1 IMPLANT
SPIKE FLUID TRANSFER (MISCELLANEOUS) ×1 IMPLANT
SPONGE T-LAP 18X18 ~~LOC~~+RFID (SPONGE) ×6 IMPLANT
STEM UNIVERSAL REVISION 75X18 (Stem) ×1 IMPLANT
STRIP CLOSURE SKIN 1/2X4 (GAUZE/BANDAGES/DRESSINGS) ×2 IMPLANT
SUT MNCRL AB 4-0 PS2 18 (SUTURE) ×2 IMPLANT
SUT STRATAFIX 0 PDS 27 VIOLET (SUTURE) ×2
SUT VIC AB 2-0 CT1 27 (SUTURE) ×6
SUT VIC AB 2-0 CT1 TAPERPNT 27 (SUTURE) ×3 IMPLANT
SUTURE STRATFX 0 PDS 27 VIOLET (SUTURE) ×1 IMPLANT
SWAB COLLECTION DEVICE MRSA (MISCELLANEOUS) IMPLANT
SWAB CULTURE ESWAB REG 1ML (MISCELLANEOUS) IMPLANT
SYR 50ML LL SCALE MARK (SYRINGE) ×4 IMPLANT
TAPE STRIPS DRAPE STRL (GAUZE/BANDAGES/DRESSINGS) ×1 IMPLANT
TOWER CARTRIDGE SMART MIX (DISPOSABLE) ×2 IMPLANT
TRAY FOLEY MTR SLVR 16FR STAT (SET/KITS/TRAYS/PACK) ×2 IMPLANT
TUBE KAMVAC SUCTION (TUBING) IMPLANT
TUBE SUCTION HIGH CAP CLEAR NV (SUCTIONS) ×2 IMPLANT
WATER STERILE IRR 1000ML POUR (IV SOLUTION) ×2 IMPLANT
WRAP KNEE MAXI GEL POST OP (GAUZE/BANDAGES/DRESSINGS) ×1 IMPLANT

## 2021-10-26 NOTE — Anesthesia Postprocedure Evaluation (Signed)
Anesthesia Post Note  Patient: [name]  Procedure(s) Performed: Left knee femoral vs total knee arthroplasty revision (Left: Knee)     Patient location during evaluation: PACU Anesthesia Type: Regional and Spinal Level of consciousness: awake and alert Pain management: pain level controlled Vital Signs Assessment: post-procedure vital signs reviewed and stable Respiratory status: spontaneous breathing and respiratory function stable Cardiovascular status: blood pressure returned to baseline and stable Postop Assessment: spinal receding Anesthetic complications: no   No notable events documented.  Last Vitals:  Vitals:   10/26/21 1200 10/26/21 1215  BP: (!) 116/58   Pulse: 61 64  Resp: 18 20  Temp:    SpO2: 92% 92%    Last Pain:  Vitals:   10/26/21 1215  TempSrc:   PainSc: 0-No pain    LLE Motor Response: Purposeful movement (10/26/21 1215) LLE Sensation: Decreased (10/26/21 1215) RLE Motor Response: Purposeful movement (10/26/21 1215) RLE Sensation: Decreased (10/26/21 1215) L Sensory Level: L3-Anterior knee, lower leg (10/26/21 1215) R Sensory Level: L3-Anterior knee, lower leg (10/26/21 1215)  Uzziel Russey DANIEL

## 2021-10-26 NOTE — Discharge Instructions (Signed)
 Laurie Aluisio, MD Total Joint Specialist [facility] [address] [place], [place] [address] [phone] POSTOPERATIVE DIRECTIONS  Knee Rehabilitation, Guidelines Following Surgery  Results after knee surgery are often greatly improved when you follow the exercise, range of motion and muscle strengthening exercises prescribed by your doctor. Safety measures are also important to protect the knee from further injury. If any of these exercises cause you to have increased pain or swelling in your knee joint, decrease the amount until you are comfortable again and slowly increase them. If you have problems or questions, call your caregiver or physical therapist for advice.   BLOOD CLOT PREVENTION Take a 325 mg Aspirin two times a day for three weeks following surgery. Then take an 81 mg Aspirin once a day for three weeks. Then discontinue Aspirin. You may resume your vitamins/supplements upon discharge from the hospital. Do not take any NSAIDs (Advil, Aleve, Ibuprofen, Meloxicam, etc.) until you have discontinued the 325 mg Aspirin.  HOME CARE INSTRUCTIONS  Remove items at home which could result in a fall. This includes throw rugs or furniture in walking pathways.  ICE to the affected knee as much as tolerated. Icing helps control swelling. If the swelling is well controlled you will be more comfortable and rehab easier. Continue to use ice on the knee for pain and swelling from surgery. You may notice swelling that will progress down to the foot and ankle. This is normal after surgery. Elevate the leg when you are not up walking on it.    Continue to use the breathing machine which will help keep your temperature down. It is common for your temperature to cycle up and down following surgery, especially at night when you are not up moving around and exerting yourself. The breathing machine keeps your lungs expanded and your temperature down. Do not place pillow  under the operative knee, focus on keeping the knee straight while resting  DIET You may resume your previous home diet once you are discharged from the hospital.  DRESSING / WOUND CARE / SHOWERING Keep your bulky bandage on for 2 days. On the third post-operative day you may remove the Ace bandage and gauze. There is a waterproof adhesive bandage on your skin which will stay in place until your first follow-up appointment. Once you remove this you will not need to place another bandage You may begin showering 3 days following surgery, but do not submerge the incision under water.  ACTIVITY For the first 5 days, the key is rest and control of pain and swelling Do your home exercises twice a day starting on post-operative day 3. On the days you go to physical therapy, just do the home exercises once that day. You should rest, ice and elevate the leg for 50 minutes out of every hour. Get up and walk/stretch for 10 minutes per hour. After 5 days you can increase your activity slowly as tolerated. Walk with your walker as instructed. Use the walker until you are comfortable transitioning to a cane. Walk with the cane in the opposite hand of the operative leg. You may discontinue the cane once you are comfortable and walking steadily. Avoid periods of inactivity such as sitting longer than an hour when not asleep. This helps prevent blood clots.  You may discontinue the knee immobilizer once you are able to perform a straight leg raise while lying down. You may resume a sexual relationship in one month or when given the OK by   your doctor.  You may return to work once you are cleared by your doctor.  Do not drive a car for 6 weeks or until released by your surgeon.  Do not drive while taking narcotics.  TED HOSE STOCKINGS Wear the elastic stockings on both legs for three weeks following surgery during the day. You may remove them at night for sleeping.  WEIGHT BEARING Weight bearing as tolerated  with assist device (walker, cane, etc) as directed, use it as long as suggested by your surgeon or therapist, typically at least 4-6 weeks.  POSTOPERATIVE CONSTIPATION PROTOCOL Constipation - defined medically as fewer than three stools per week and severe constipation as less than one stool per week.  One of the most common issues patients have following surgery is constipation.  Even if you have a regular bowel pattern at home, your normal regimen is likely to be disrupted due to multiple reasons following surgery.  Combination of anesthesia, postoperative narcotics, change in appetite and fluid intake all can affect your bowels.  In order to avoid complications following surgery, here are some recommendations in order to help you during your recovery period.  Colace (docusate) - Pick up an over-the-counter form of Colace or another stool softener and take twice a day as long as you are requiring postoperative pain medications.  Take with a full glass of water daily.  If you experience loose stools or diarrhea, hold the colace until you stool forms back up. If your symptoms do not get better within 1 week or if they get worse, check with your doctor. Dulcolax (bisacodyl) - Pick up over-the-counter and take as directed by the product packaging as needed to assist with the movement of your bowels.  Take with a full glass of water.  Use this product as needed if not relieved by Colace only.  MiraLax (polyethylene glycol) - Pick up over-the-counter to have on hand. MiraLax is a solution that will increase the amount of water in your bowels to assist with bowel movements.  Take as directed and can mix with a glass of water, juice, soda, coffee, or tea. Take if you go more than two days without a movement. Do not use MiraLax more than once per day. Call your doctor if you are still constipated or irregular after using this medication for 7 days in a row.  If you continue to have problems with postoperative  constipation, please contact the office for further assistance and recommendations.  If you experience "the worst abdominal pain ever" or develop nausea or vomiting, please contact the office immediatly for further recommendations for treatment.  ITCHING If you experience itching with your medications, try taking only a single pain pill, or even half a pain pill at a time.  You can also use Benadryl over the counter for itching or also to help with sleep.   MEDICATIONS See your medication summary on the "After Visit Summary" that the nursing staff will review with you prior to discharge.  You may have some home medications which will be placed on hold until you complete the course of blood thinner medication.  It is important for you to complete the blood thinner medication as prescribed by your surgeon.  Continue your approved medications as instructed at time of discharge.  PRECAUTIONS If you experience chest pain or shortness of breath - call 911 immediately for transfer to the hospital emergency department.  If you develop a fever greater that 101 F, purulent drainage from wound, increased redness   or drainage from wound, foul odor from the wound/dressing, or calf pain - CONTACT YOUR SURGEON.                                                   FOLLOW-UP APPOINTMENTS Make sure you keep all of your appointments after your operation with your surgeon and caregivers. You should call the office at the above phone number and make an appointment for approximately two weeks after the date of your surgery or on the date instructed by your surgeon outlined in the "After Visit Summary".  RANGE OF MOTION AND STRENGTHENING EXERCISES  Rehabilitation of the knee is important following a knee injury or an operation. After just a few days of immobilization, the muscles of the thigh which control the knee become weakened and shrink (atrophy). Knee exercises are designed to build up the tone and strength of the thigh  muscles and to improve knee motion. Often times heat used for twenty to thirty minutes before working out will loosen up your tissues and help with improving the range of motion but do not use heat for the first two weeks following surgery. These exercises can be done on a training (exercise) mat, on the floor, on a table or on a bed. Use what ever works the best and is most comfortable for you Knee exercises include:  Leg Lifts - While your knee is still immobilized in a splint or cast, you can do straight leg raises. Lift the leg to 60 degrees, hold for 3 sec, and slowly lower the leg. Repeat 10-20 times 2-3 times daily. Perform this exercise against resistance later as your knee gets better.  Quad and Hamstring Sets - Tighten up the muscle on the front of the thigh (Quad) and hold for 5-10 sec. Repeat this 10-20 times hourly. Hamstring sets are done by pushing the foot backward against an object and holding for 5-10 sec. Repeat as with quad sets.  Leg Slides: Lying on your back, slowly slide your foot toward your buttocks, bending your knee up off the floor (only go as far as is comfortable). Then slowly slide your foot back down until your leg is flat on the floor again. Angel Wings: Lying on your back spread your legs to the side as far apart as you can without causing discomfort.  A rehabilitation program following serious knee injuries can speed recovery and prevent re-injury in the future due to weakened muscles. Contact your doctor or a physical therapist for more information on knee rehabilitation.   POST-OPERATIVE OPIOID TAPER INSTRUCTIONS: It is important to wean off of your opioid medication as soon as possible. If you do not need pain medication after your surgery it is ok to stop day one. Opioids include: Codeine, Hydrocodone(Norco, Vicodin), Oxycodone(Percocet, oxycontin) and hydromorphone amongst others.  Long term and even short term use of opiods can cause: Increased pain  response Dependence Constipation Depression Respiratory depression And more.  Withdrawal symptoms can include Flu like symptoms Nausea, vomiting And more Techniques to manage these symptoms Hydrate well Eat regular healthy meals Stay active Use relaxation techniques(deep breathing, meditating, yoga) Do Not substitute Alcohol to help with tapering If you have been on opioids for less than two weeks and do not have pain than it is ok to stop all together.  Plan to wean off of opioids This   plan should start within one week post op of your joint replacement. Maintain the same interval or time between taking each dose and first decrease the dose.  Cut the total daily intake of opioids by one tablet each day Next start to increase the time between doses. The last dose that should be eliminated is the evening dose.   IF YOU ARE TRANSFERRED TO A SKILLED REHAB FACILITY If the patient is transferred to a skilled rehab facility following release from the hospital, a list of the current medications will be sent to the facility for the patient to continue.  When discharged from the skilled rehab facility, please have the facility set up the patient's Home Health Physical Therapy prior to being released. Also, the skilled facility will be responsible for providing the patient with their medications at time of release from the facility to include their pain medication, the muscle relaxants, and their blood thinner medication. If the patient is still at the rehab facility at time of the two week follow up appointment, the skilled rehab facility will also need to assist the patient in arranging follow up appointment in our office and any transportation needs.  MAKE SURE YOU:  Understand these instructions.  Get help right away if you are not doing well or get worse.   DENTAL ANTIBIOTICS:  In most cases prophylactic antibiotics for Dental procdeures after total joint surgery are not  necessary.  Exceptions are as follows:  1. History of prior total joint infection  2. Severely immunocompromised (Organ Transplant, cancer chemotherapy, Rheumatoid biologic meds such as Humera)  3. Poorly controlled diabetes (A1C &gt; 8.0, blood glucose over 200)  If you have one of these conditions, contact your surgeon for an antibiotic prescription, prior to your dental procedure.    Pick up stool softner and laxative for home use following surgery while on pain medications. Do not submerge incision under water. Please use good hand washing techniques while changing dressing each day. May shower starting three days after surgery. Please use a clean towel to pat the incision dry following showers. Continue to use ice for pain and swelling after surgery. Do not use any lotions or creams on the incision until instructed by your surgeon.  

## 2021-10-26 NOTE — Anesthesia Procedure Notes (Signed)
Procedure Name: MAC Date/Time: 10/26/2021 8:30 AM Performed by: Deliah Boston, CRNA Pre-anesthesia Checklist: Patient identified, Emergency Drugs available, Suction available and Patient being monitored Patient Re-evaluated:Patient Re-evaluated prior to induction Oxygen Delivery Method: Simple face mask Preoxygenation: Pre-oxygenation with 100% oxygen Placement Confirmation: positive ETCO2 and breath sounds checked- equal and bilateral

## 2021-10-26 NOTE — Plan of Care (Signed)
Discussed with patient and family about plan of care for post-op day 0.   Will continue to monitor patient.    [place], Therapist, sports

## 2021-10-26 NOTE — Evaluation (Signed)
Physical Therapy Evaluation Patient Details Name: [name] MRN: [id number] DOB: 06/28/1941 Today's Date: 10/26/2021  History of Present Illness  [name], 81 y.o. female, who has a history of two knee replacements for the left knee. S/P left TRK revision 10/26/21. PMH: R TKA and back surgery  Clinical Impression  The patient required mod assistance for  bed mobility. Reports increased pain with standing. Able to step to recliner with Rw and mod assistance.  Patient anxious  stating that she has no support at home and hoped a nurse could stay with her.  Will defer to TOC.  Patient will require 24/7 assistance at the present.   Patient asking if she can drive to her PT appointment. Dr. Wynelle Link in to  provide answers to her  questions. Pt admitted with above diagnosis.  Pt currently with functional limitations due to the deficits listed below (see PT Problem List). Pt will benefit from skilled PT to increase their independence and safety with mobility to allow discharge to the venue listed below.         Recommendations for follow up therapy are one component of a multi-disciplinary discharge planning process, led by the attending physician.  Recommendations may be updated based on patient status, additional functional criteria and insurance authorization.  Follow Up Recommendations Follow physician's recommendations for discharge plan and follow up therapies    Assistance Recommended at Discharge Frequent or constant Supervision/Assistance  Patient can return home with the following  A lot of help with walking and/or transfers;A lot of help with bathing/dressing/bathroom;Assist for transportation;Help with stairs or ramp for entrance;Assistance with cooking/housework    Equipment Recommendations Rolling walker (2 wheels)  Recommendations for Other Services  OT consult    Functional Status Assessment Patient has had a recent decline in their functional status and demonstrates  the ability to make significant improvements in function in a reasonable and predictable amount of time.     Precautions / Restrictions Precautions Precautions: Fall Required Braces or Orthoses: Knee Immobilizer - Left Knee Immobilizer - Left: Discontinue once straight leg raise with < 10 degree lag      Mobility  Bed Mobility Overal bed mobility: Needs Assistance Bed Mobility: Supine to Sit     Supine to sit: Mod assist, HOB elevated     General bed mobility comments: support the left leg  to move to bed edge    Transfers Overall transfer level: Needs assistance Equipment used: Rolling walker (2 wheels) Transfers: Sit to/from Stand, Bed to chair/wheelchair/BSC Sit to Stand: Mod assist   Step pivot transfers: Mod assist       General transfer comment: Much support  to stand and step to recliner. patient very anxious    Ambulation/Gait                  Stairs            Wheelchair Mobility    Modified Rankin (Stroke Patients Only)       Balance Overall balance assessment: Needs assistance Sitting-balance support: Feet supported, Bilateral upper extremity supported Sitting balance-Leahy Scale: Fair     Standing balance support: During functional activity, Bilateral upper extremity supported, Reliant on assistive device for balance Standing balance-Leahy Scale: Poor                               Pertinent Vitals/Pain Pain Assessment Pain Assessment: 0-10 Pain Score: 7  Pain Location: left  knee Pain Descriptors / Indicators: Burning, Aching, Discomfort Pain Intervention(s): Premedicated before session, Monitored during session, Limited activity within patient's tolerance    Home Living Family/patient expects to be discharged to:: Private residence Living Arrangements: Alone Available Help at Discharge: Family Type of Home: House Home Access: Level entry         Home Equipment: Kasandra Knudsen - single point Additional Comments:  patient reports family not avaialble to assist.    Prior Function Prior Level of Function : Independent/Modified Independent             Mobility Comments: drives       Hand Dominance   Dominant Hand: Right    Extremity/Trunk Assessment   Upper Extremity Assessment Upper Extremity Assessment: Overall WFL for tasks assessed    Lower Extremity Assessment Lower Extremity Assessment: LLE deficits/detail LLE Deficits / Details: in KI, assist to mopve the leg on bed.    Cervical / Trunk Assessment Cervical / Trunk Assessment: Normal  Communication   Communication: No difficulties  Cognition Arousal/Alertness: Awake/alert Behavior During Therapy: Anxious, WFL for tasks assessed/performed Overall Cognitive Status: Within Functional Limits for tasks assessed                                 General Comments: patient tearful when speaking that she has no assistnace,        General Comments      Exercises     Assessment/Plan    PT Assessment Patient needs continued PT services  PT Problem List Decreased strength;Decreased mobility;Decreased safety awareness;Decreased range of motion;Decreased knowledge of precautions;Decreased activity tolerance;Decreased balance       PT Treatment Interventions DME instruction;Therapeutic activities;Gait training;Therapeutic exercise;Patient/family education;Functional mobility training    PT Goals (Current goals can be found in the Care Plan section)  Acute Rehab PT Goals Patient Stated Goal: to have a nurse stay with me PT Goal Formulation: With patient Time For Goal Achievement: 11/02/21 Potential to Achieve Goals: Good    Frequency 7X/week     Co-evaluation               AM-PAC PT "6 Clicks" Mobility  Outcome Measure Help needed turning from your back to your side while in a flat bed without using bedrails?: A Lot Help needed moving from lying on your back to sitting on the side of a flat bed  without using bedrails?: A Lot Help needed moving to and from a bed to a chair (including a wheelchair)?: A Lot Help needed standing up from a chair using your arms (e.g., wheelchair or bedside chair)?: A Lot Help needed to walk in hospital room?: Total Help needed climbing 3-5 steps with a railing? : Total 6 Click Score: 10    End of Session Equipment Utilized During Treatment: Gait belt;Left knee immobilizer Activity Tolerance: Patient limited by pain Patient left: in chair;with call bell/phone within reach;with chair alarm set;with nursing/sitter in room Nurse Communication: Mobility status PT Visit Diagnosis: Unsteadiness on feet (R26.81);Muscle weakness (generalized) (M62.81);Difficulty in walking, not elsewhere classified (R26.2);Pain Pain - Right/Left: Left Pain - part of body: Knee    Time: 1640-1701 PT Time Calculation (min) (ACUTE ONLY): 21 min   Charges:   PT Evaluation $PT Eval Low Complexity: [facility] Acute Rehabilitation Services Pager [phone] Office [phone]   Claretha Cooper 10/26/2021, 5:57 PM

## 2021-10-26 NOTE — Transfer of Care (Signed)
Immediate Anesthesia Transfer of Care Note  Patient: [name]  Procedure(s) Performed: Procedure(s): Left knee femoral vs total knee arthroplasty revision (Left)  Patient Location: PACU  Anesthesia Type:MAC, Regional and Spinal  Level of Consciousness: Patient easily awoken, sedated, comfortable, cooperative, following commands, responds to stimulation.   Airway & Oxygen Therapy: Patient spontaneously breathing, ventilating well, oxygen via simple oxygen mask.  Post-op Assessment: Report given to PACU RN, vital signs reviewed and stable.Marland Kitchen   Post vital signs: Reviewed and stable.  Complications: No apparent anesthesia complications  Last Vitals:  Vitals Value Taken Time  BP 115/66 10/26/21 1045  Temp    Pulse 68 10/26/21 1046  Resp 16 10/26/21 1046  SpO2 100 % 10/26/21 1046  Vitals shown include unvalidated device data.  Last Pain:  Vitals:   10/26/21 0655  TempSrc: Oral         Complications: No notable events documented.

## 2021-10-26 NOTE — Anesthesia Procedure Notes (Signed)
Spinal  Patient location during procedure: OR Start time: 10/26/2021 8:27 AM End time: 10/26/2021 8:37 AM Reason for block: surgical anesthesia Staffing Performed: anesthesiologist  Anesthesiologist: Duane Boston, MD Preanesthetic Checklist Completed: patient identified, IV checked, risks and benefits discussed, surgical consent, monitors and equipment checked, pre-op evaluation and timeout performed Spinal Block Patient position: sitting Prep: DuraPrep Patient monitoring: cardiac monitor, continuous pulse ox and blood pressure Approach: midline Location: L2-3 Injection technique: single-shot Needle Needle type: Pencan  Needle gauge: 24 G Needle length: 9 cm Assessment Events: CSF return Additional Notes Functioning IV was confirmed and monitors were applied. Sterile prep and drape, including hand hygiene and sterile gloves were used. The patient was positioned and the spine was prepped. The skin was anesthetized with lidocaine.  Free flow of clear CSF was obtained prior to injecting local anesthetic into the CSF.  The spinal needle aspirated freely following injection.  The needle was carefully withdrawn.  The patient tolerated the procedure well.

## 2021-10-26 NOTE — Brief Op Note (Signed)
10/26/2021  11:32 AM  PATIENT:  Deanne Coffer Arenz  81 y.o. female  PRE-OPERATIVE DIAGNOSIS:  failed left total knee arthroplasty  POST-OPERATIVE DIAGNOSIS:  failed left total knee arthroplasty  PROCEDURE:  Procedure(s): Left knee femoral vs total knee arthroplasty revision (Left)  SURGEON:  Surgeon(s) and Role:    Gaynelle Arabian, MD - Primary  PHYSICIAN ASSISTANT:   ASSISTANTS: Theresa Duty, PA-C   ANESTHESIA:    Adductor canal block and spinal  EBL:  25 mL   BLOOD ADMINISTERED:none  DRAINS: none   LOCAL MEDICATIONS USED:  OTHER Exparel  COUNTS:  YES  TOURNIQUET:   Total Tourniquet Time Documented: Thigh (Left) - 75 minutes Total: Thigh (Left) - 75 minutes   DICTATION: .Other Dictation: Dictation Number [id number]  PLAN OF CARE: Admit to inpatient   PATIENT DISPOSITION:  PACU - hemodynamically stable.

## 2021-10-26 NOTE — Interval H&P Note (Signed)
History and Physical Interval Note:  10/26/2021 8:08 AM  [name]  has presented today for surgery, with the diagnosis of failed left total knee arthroplasty.  The various methods of treatment have been discussed with the patient and family. After consideration of risks, benefits and other options for treatment, the patient has consented to  Procedure(s): Left knee femoral vs total knee arthroplasty revision (Left) as a surgical intervention.  The patient's history has been reviewed, patient examined, no change in status, stable for surgery.  I have reviewed the patient's chart and labs.  Questions were answered to the patient's satisfaction.     Pilar Plate Arien Morine

## 2021-10-26 NOTE — Anesthesia Procedure Notes (Signed)
Anesthesia Regional Block: Adductor canal block   Pre-Anesthetic Checklist: , timeout performed,  Correct Patient, Correct Site, Correct Laterality,  Correct Procedure, Correct Position, site marked,  Risks and benefits discussed,  Surgical consent,  Pre-op evaluation,  At surgeon's request and post-op pain management  Laterality: Left  Prep: chloraprep       Needles:  Injection technique: Single-shot  Needle Type: Stimulator Needle - 80     Needle Length: 10cm  Needle Gauge: 21     Additional Needles:   Narrative:  Start time: 10/26/2021 7:36 AM End time: 10/26/2021 7:26 AM Injection made incrementally with aspirations every 5 mL.  Performed by: Personally  Anesthesiologist: Duane Boston, MD

## 2021-10-27 ENCOUNTER — Encounter (HOSPITAL_COMMUNITY): Payer: Self-pay | Admitting: Orthopedic Surgery

## 2021-10-27 LAB — CBC
HCT: 36.7 % (ref 36.0–46.0)
Hemoglobin: 11.6 g/dL — ABNORMAL LOW (ref 12.0–15.0)
MCH: 30.9 pg (ref 26.0–34.0)
MCHC: 31.6 g/dL (ref 30.0–36.0)
MCV: 97.6 fL (ref 80.0–100.0)
Platelets: 212 10*3/uL (ref 150–400)
RBC: 3.76 MIL/uL — ABNORMAL LOW (ref 3.87–5.11)
RDW: 13.1 % (ref 11.5–15.5)
WBC: 10.8 10*3/uL — ABNORMAL HIGH (ref 4.0–10.5)
nRBC: 0 % (ref 0.0–0.2)

## 2021-10-27 LAB — BASIC METABOLIC PANEL
Anion gap: 4 — ABNORMAL LOW (ref 5–15)
BUN: 30 mg/dL — ABNORMAL HIGH (ref 8–23)
CO2: 29 mmol/L (ref 22–32)
Calcium: 9 mg/dL (ref 8.9–10.3)
Chloride: 103 mmol/L (ref 98–111)
Creatinine, Ser: 1.14 mg/dL — ABNORMAL HIGH (ref 0.44–1.00)
GFR, Estimated: 49 mL/min — ABNORMAL LOW (ref >60)
Glucose, Bld: 146 mg/dL — ABNORMAL HIGH (ref 70–99)
Potassium: 5.5 mmol/L — ABNORMAL HIGH (ref 3.5–5.1)
Sodium: 136 mmol/L (ref 135–145)

## 2021-10-27 MED ORDER — ONDANSETRON HCL 4 MG PO TABS: 4.0000 mg | ORAL_TABLET | Freq: Four times a day (QID) | ORAL | 0 refills | Status: DC | PRN

## 2021-10-27 MED ORDER — TRAMADOL HCL 50 MG PO TABS: 50.0000 mg | ORAL_TABLET | Freq: Four times a day (QID) | ORAL | 0 refills | Status: DC | PRN

## 2021-10-27 MED ORDER — METHOCARBAMOL 500 MG PO TABS: 500.0000 mg | ORAL_TABLET | Freq: Four times a day (QID) | ORAL | 0 refills | Status: DC | PRN

## 2021-10-27 MED ORDER — ASPIRIN 325 MG PO TBEC: 325.0000 mg | DELAYED_RELEASE_TABLET | Freq: Two times a day (BID) | ORAL | 0 refills | Status: AC

## 2021-10-27 MED ORDER — OXYCODONE HCL 5 MG PO TABS: 5.0000 mg | ORAL_TABLET | Freq: Four times a day (QID) | ORAL | 0 refills | Status: DC | PRN

## 2021-10-27 NOTE — Plan of Care (Signed)
  Problem: Pain Management: Goal: Pain level will decrease with appropriate interventions Outcome: Progressing   

## 2021-10-27 NOTE — TOC Initial Note (Signed)
Transition of Care [facility]) - Initial/Assessment Note   Patient Details  Name: [name] MRN: [id number] Date of Birth: 04/10/1941  Transition of Care [facility]) CM/SW Contact:    Sherie Don, LCSW Phone Number: 10/27/2021, 12:26 PM  Clinical Narrative: [facility] consulted for SNF. CSW met with patient to discuss rehab. Per patient, she is unsure if she will need rehab, but would agree to [facility] if she does not progress well enough to go home. CSW confirmed with PA that SNF is the plan if the patient does not progress enough to go home.  FL2 done; PASRR verified. Initial referral faxed out.  CSW followed up with patient. Patient reported she thinks she will be able to go  home with the original discharge plan of OPPT at [facility] PT. Patient stated, "I will be leaving tomorrow" and confirmed she will need a rolling walker before discharging home. TOC to follow.  Expected Discharge Plan: Skilled Nursing Facility Barriers to Discharge: SNF Pending bed offer  Patient Goals and CMS Choice Patient states their goals for this hospitalization and ongoing recovery are:: Go to rehab before returning home CMS Medicare.gov Compare Post Acute Care list provided to:: Patient Choice offered to / list presented to : Patient  Expected Discharge Plan and Services Expected Discharge Plan: [place] In-house Referral: Clinical Social Work Post Acute Care Choice: [place] Living arrangements for the past 2 months: [place]          DME Arranged: N/A DME Agency: NA  Prior Living Arrangements/Services Living arrangements for the past 2 months: Single Family Home Lives with:: Self Patient language and need for interpreter reviewed:: Yes Do you feel safe going back to the place where you live?: Yes      Need for Family Participation in Patient Care: No (Comment) Care giver support system in place?: Yes (comment) Criminal Activity/Legal Involvement Pertinent to  Current Situation/Hospitalization: No - Comment as needed  Activities of Daily Living Home Assistive Devices/Equipment: Gilford Rile (specify type) (4 point) ADL Screening (condition at time of admission) Patient's cognitive ability adequate to safely complete daily activities?: Yes Is the patient deaf or have difficulty hearing?: No Does the patient have difficulty seeing, even when wearing glasses/contacts?: No Does the patient have difficulty concentrating, remembering, or making decisions?: No Patient able to express need for assistance with ADLs?: Yes Does the patient have difficulty dressing or bathing?: No Independently performs ADLs?: Yes (appropriate for developmental age) Does the patient have difficulty walking or climbing stairs?: No Weakness of Legs: Left Weakness of Arms/Hands: None  Permission Sought/Granted Permission sought to share information with : Facility Art therapist granted to share information with : Yes, Verbal Permission Granted Permission granted to share info w AGENCY: SNFs  Emotional Assessment Appearance:: Appears stated age Attitude/Demeanor/Rapport: Engaged Affect (typically observed): Accepting Orientation: : Oriented to Self, Oriented to Place, Oriented to  Time, Oriented to Situation Alcohol / Substance Use: Not Applicable Psych Involvement: No (comment)  Admission diagnosis:  Failed total left knee replacement ([place]) [T84.093A] Patient Active Problem List   Diagnosis Date Noted   Failed total knee arthroplasty ([place]) 10/26/2021   Failed total left knee replacement ([place]) 10/26/2021   Insomnia 11/30/2020   Graves' disease 03/27/2020   Neuropathy 12/05/2017   Hyperthyroidism 01/27/2017   Chronic pain syndrome 10/24/2016   High cholesterol 10/24/2016   Lumbar post-laminectomy syndrome 10/04/2015   Chronic lumbar radiculopathy 10/04/2015   PCP NOTES >>>>> 08/07/2015   Allergic rhinitis 01/21/2015  Leg edema 12/16/2013   Weight  loss 08/29/2012   Lumbar canal stenosis 08/20/2012   Annual physical exam 11/03/2011   Heart murmur 11/03/2011   THYROID NODULE 03/15/2010   PERSONAL HX COLONIC POLYPS 04/30/2008   FATIGUE 04/17/2008   Anxiety state 01/27/2008   Osteoarthritis 01/27/2008   BAKER'S CYST 12/20/2006   Essential hypertension 09/11/2005   PCP:  Colon Branch, MD Pharmacy:   [id number] [place] [id number]: [phone]: [phone] [id number]: [phone]: [phone] Readmission Risk Interventions No flowsheet data found.

## 2021-10-27 NOTE — Progress Notes (Signed)
Pt and her daughter in law verbalized that Pt will go home after PT. Pt's daughter in law stated that she will go with Pt after discharge and stay one night with her. Both felt comfortable going home after PT tomorrow. SW is notified.

## 2021-10-27 NOTE — Progress Notes (Addendum)
Physical Therapy Treatment Patient Details Name: [name] MRN: [id number] DOB: October 13, 1940 Today's Date: 10/27/2021   History of Present Illness [name], 81 y.o. female, who has a history of two knee replacements for the left knee. S/P left TRK revision 10/26/21. PMH: R TKA and back surgery    PT Comments    POD # 1 am session Pt already OOB in recliner.  Assisted with amb in hallway.  General Gait Details: 50% VC's on proper walker to self distance and safety with turns.  Tolerated an increased distance in hallway. Then returned to room to perform some TE's following HEP handout.  Instructed on proper tech, freq as well as use of ICE.    Pt's daughter in law in room at end of session with concerns about pt going back home alone.  Daughter asking about ST Rehab cause "she went before" with prior knee surgeries.  Will see pt again this afternoon.   Recommendations for follow up therapy are one component of a multi-disciplinary discharge planning process, led by the attending physician.  Recommendations may be updated based on patient status, additional functional criteria and insurance authorization.  Follow Up Recommendations  Follow physician's recommendations for discharge plan and follow up therapies     Assistance Recommended at Discharge Frequent or constant Supervision/Assistance  Patient can return home with the following A lot of help with walking and/or transfers;A lot of help with bathing/dressing/bathroom;Assist for transportation;Help with stairs or ramp for entrance;Assistance with cooking/housework   Equipment Recommendations  Rolling walker (2 wheels)    Recommendations for Other Services       Precautions / Restrictions Precautions Precautions: Fall Precaution Comments: instructed no pillow under knee Restrictions Weight Bearing Restrictions: No LLE Weight Bearing: Weight bearing as tolerated     Mobility  Bed Mobility               General  bed mobility comments: OOB in recliner    Transfers Overall transfer level: Needs assistance Equipment used: Rolling walker (2 wheels) Transfers: Sit to/from Stand Sit to Stand: Min assist           General transfer comment: 50% VC's on proper hand placement and safety with turns    Ambulation/Gait Ambulation/Gait assistance: Min guard, Min assist Gait Distance (Feet): 35 Feet Assistive device: Rolling walker (2 wheels) Gait Pattern/deviations: Step-to pattern, Decreased stance time - left Gait velocity: decreased     General Gait Details: 50% VC's on proper walker to self distance and safety with turns.  Tolerated an increased distance in hallway.   Stairs             Wheelchair Mobility    Modified Rankin (Stroke Patients Only)       Balance                                            Cognition Arousal/Alertness: Awake/alert Behavior During Therapy: Anxious, WFL for tasks assessed/performed Overall Cognitive Status: Within Functional Limits for tasks assessed                                 General Comments: AxO x 3 very pleasant Lady who lives in [place] in a [facility] alone        Exercises  Total Knee Replacement TE's following HEP  handout 10 reps B LE ankle pumps 05 reps towel squeezes 05 reps knee presses 05 reps heel slides  05 reps SAQ's 05 reps SLR's 05 reps ABD Educated on use of gait belt to assist with TE's Followed by ICE     General Comments        Pertinent Vitals/Pain Pain Assessment Pain Score: 5  Pain Location: left knee Pain Descriptors / Indicators: Burning, Aching, Discomfort Pain Intervention(s): Monitored during session, Repositioned, Ice applied    Home Living                          Prior Function            PT Goals (current goals can now be found in the care plan section) Progress towards PT goals: Progressing toward goals    Frequency     7X/week      PT Plan Current plan remains appropriate    Co-evaluation              AM-PAC PT "6 Clicks" Mobility   Outcome Measure  Help needed turning from your back to your side while in a flat bed without using bedrails?: A Little Help needed moving from lying on your back to sitting on the side of a flat bed without using bedrails?: A Little Help needed moving to and from a bed to a chair (including a wheelchair)?: A Little Help needed standing up from a chair using your arms (e.g., wheelchair or bedside chair)?: A Little Help needed to walk in hospital room?: A Little Help needed climbing 3-5 steps with a railing? : A Lot 6 Click Score: 17    End of Session Equipment Utilized During Treatment: Gait belt Activity Tolerance: Patient limited by pain Patient left: in chair;with call bell/phone within reach;with chair alarm set;with nursing/sitter in room Nurse Communication: Mobility status PT Visit Diagnosis: Unsteadiness on feet (R26.81);Muscle weakness (generalized) (M62.81);Difficulty in walking, not elsewhere classified (R26.2);Pain Pain - Right/Left: Left Pain - part of body: Knee     Time: 6270-3500 PT Time Calculation (min) (ACUTE ONLY): 25 min  Charges:  $Gait Training: 8-22 mins $Therapeutic Exercise: 8-22 mins                     Rica Koyanagi  PTA Acute  Rehabilitation Services Pager      [phone] Office      [phone]

## 2021-10-27 NOTE — Progress Notes (Signed)
Subjective: 1 Day Post-Op Procedure(s) (LRB): Left knee femoral vs total knee arthroplasty revision (Left) Patient reports pain as mild.   Patient seen in rounds by Dr. Wynelle Link. Patient is well, and has had no acute complaints or problems. No issues overnight, foley catheter removed this AM. Denies CP or SOB. We will continue therapy today.  Objective: Vital signs in last 24 hours: Temp:  [97.6 F (36.4 C)-98.2 F (36.8 C)] 97.9 F (36.6 C) (02/16 0604) Pulse Rate:  [49-71] 58 (02/16 0604) Resp:  [11-20] 18 (02/16 0604) BP: (97-144)/(55-90) 123/57 (02/16 0604) SpO2:  [92 %-100 %] 97 % (02/16 0604)  Intake/Output from previous day:  Intake/Output Summary (Last 24 hours) at 10/27/2021 0751 Last data filed at 10/27/2021 0600 Gross per 24 hour  Intake 3302.75 ml  Output 2075 ml  Net 1227.75 ml     Intake/Output this shift: No intake/output data recorded.  Labs: Recent Labs    10/27/21 0326  HGB 11.6*   Recent Labs    10/27/21 0326  WBC 10.8*  RBC 3.76*  HCT 36.7  PLT 212   Recent Labs    10/27/21 0326  NA 136  K 5.5*  CL 103  CO2 29  BUN 30*  CREATININE 1.14*  GLUCOSE 146*  CALCIUM 9.0   No results for input(s): LABPT, INR in the last 72 hours.  Exam: General - Patient is Alert and Oriented Extremity - Neurologically intact Neurovascular intact Sensation intact distally Dorsiflexion/Plantar flexion intact Dressing - dressing C/D/I Motor Function - intact, moving foot and toes well on exam.   Past Medical History:  Diagnosis Date   Adenomatous colon polyp 03/2003   Anxiety    ativan for sleep   Baker's cyst    Degenerative lumbar disc    Dr. Nelva Bush, epidural injections   Goiter    History of elevated homocysteine    Hypertension 09/11/2005   Lumbar radiculopathy    Dr. Nelva Bush, epidureal injections   Lumbosacral radiculopathy    seeing Pain Management   OA (osteoarthritis)     knee pain after TKR left , back pain- last steroid injection back  2 months ago Dr Nelva Bush   PONV (postoperative nausea and vomiting)    Postlaminectomy syndrome, lumbar region    Dr. Nelva Bush, recommended doing bilateral S1 transforaminal epidural with D5W   Thyroid nodule    rt benign colloid nodule--left hyperplastic nodule- Thyroid bx 2006: neg    Assessment/Plan: 1 Day Post-Op Procedure(s) (LRB): Left knee femoral vs total knee arthroplasty revision (Left) Principal Problem:   Failed total knee arthroplasty ([place]) Active Problems:   Failed total left knee replacement ([facility])  Estimated body mass index is 28.84 kg/m as calculated from the following:   Height as of this encounter: 5\' 4"  (1.626 m).   Weight as of this encounter: 76.2 kg. Advance diet Up with therapy D/C IV fluids  Anticipated LOS equal to or greater than 2 midnights due to - Age 30 and older with one or more of the following:  - Obesity  - Expected need for hospital services (PT, OT, Nursing) required for safe  discharge  - Anticipated need for postoperative skilled nursing care or inpatient rehab  - Active co-morbidities: Chronic pain requiring opiods OR   - Unanticipated findings during/Post Surgery: None  - Patient is a high risk of re-admission due to: None   DVT Prophylaxis - Aspirin Weight bearing as tolerated. Continue therapy.  Patient lives at home alone. Will consult social work for  SNF placement. If does exceptionally well with mobility today and tomorrow, may be able to dc to home with HHPT. Otherwise will require rehab.   Theresa Duty, PA-C Orthopedic Surgery [phone] 10/27/2021, 7:51 AM

## 2021-10-27 NOTE — NC FL2 (Signed)
[place] LEVEL OF CARE SCREENING TOOL     IDENTIFICATION  Patient Name: [name] Birthdate: May 05, 1941 Sex: female Admission Date (Current Location): 10/26/2021  [facility] and [place] Number:  Herbalist and Address:  [facility],  [address], [place]      Provider Number: [id number]  Attending Physician Name and Address:  Gaynelle Arabian, MD  Relative Name and Phone Number:  Dolorez Jeffrey (daughter) Ph: [phone]    Current Level of Care: Hospital Recommended Level of Care: [place] Prior Approval Number:    Date Approved/Denied:   PASRR Number: [id number]  Discharge Plan: SNF    Current Diagnoses: Patient Active Problem List   Diagnosis Date Noted   Failed total knee arthroplasty ([place]) 10/26/2021   Failed total left knee replacement ([place]) 10/26/2021   Insomnia 11/30/2020   Graves' disease 03/27/2020   Neuropathy 12/05/2017   Hyperthyroidism 01/27/2017   Chronic pain syndrome 10/24/2016   High cholesterol 10/24/2016   Lumbar post-laminectomy syndrome 10/04/2015   Chronic lumbar radiculopathy 10/04/2015   PCP NOTES >>>>> 08/07/2015   Allergic rhinitis 01/21/2015   Leg edema 12/16/2013   Weight loss 08/29/2012   Lumbar canal stenosis 08/20/2012   Annual physical exam 11/03/2011   Heart murmur 11/03/2011   THYROID NODULE 03/15/2010   PERSONAL HX COLONIC POLYPS 04/30/2008   FATIGUE 04/17/2008   Anxiety state 01/27/2008   Osteoarthritis 01/27/2008   BAKER'S CYST 12/20/2006   Essential hypertension 09/11/2005    Orientation RESPIRATION BLADDER Height & Weight     Self, Time, Situation, Place  Normal Continent Weight: 168 lb (76.2 kg) Height:  5\' 4"  (162.6 cm)  BEHAVIORAL SYMPTOMS/MOOD NEUROLOGICAL BOWEL NUTRITION STATUS   (N/A)  (N/A) Continent Diet (Regular diet)  AMBULATORY STATUS COMMUNICATION OF NEEDS Skin   Extensive Assist Verbally Surgical wounds                        Personal Care Assistance Level of Assistance  Bathing, Feeding, Dressing Bathing Assistance: Limited assistance Feeding assistance: Independent Dressing Assistance: Limited assistance     Functional Limitations Info  Sight, Hearing, Speech Sight Info: Adequate Hearing Info: Adequate Speech Info: Adequate    SPECIAL CARE FACTORS FREQUENCY  PT (By licensed PT), OT (By licensed OT)     PT Frequency: 5x's/week OT Frequency: 5x's/week            Contractures Contractures Info: Not present    Additional Factors Info  Code Status, Allergies Code Status Info: Full Allergies Info: NKA           Current Medications (10/27/2021):  This is the current hospital active medication list Current Facility-Administered Medications  Medication Dose Route Frequency Provider Last Rate Last Admin   0.9 %  sodium chloride infusion   Intravenous Continuous Edmisten, Kristie L, PA   Stopped at 10/27/21 0823   acetaminophen (TYLENOL) tablet 1,000 mg  1,000 mg Oral Q6H Edmisten, Kristie L, PA   1,000 mg at 10/27/21 0948   aspirin EC tablet 325 mg  325 mg Oral BID Edmisten, Kristie L, PA   325 mg at 10/27/21 0948   bisacodyl (DULCOLAX) suppository 10 mg  10 mg Rectal Daily PRN Edmisten, Kristie L, PA       diphenhydrAMINE (BENADRYL) 12.5 MG/5ML elixir 12.5-25 mg  12.5-25 mg Oral Q4H PRN Edmisten, Kristie L, PA       docusate sodium (COLACE) capsule 100 mg  100 mg  Oral BID Derl Barrow, PA   100 mg at 10/27/21 0948   ezetimibe (ZETIA) tablet 10 mg  10 mg Oral Daily Edmisten, Kristie L, PA   10 mg at 10/27/21 0948   gabapentin (NEURONTIN) capsule 800 mg  800 mg Oral QID Edmisten, Kristie L, PA   800 mg at 10/27/21 4132   menthol-cetylpyridinium (CEPACOL) lozenge 3 mg  1 lozenge Oral PRN Edmisten, Kristie L, PA       Or   phenol (CHLORASEPTIC) mouth spray 1 spray  1 spray Mouth/Throat PRN Edmisten, Kristie L, PA       methocarbamol (ROBAXIN) tablet 500 mg  500 mg Oral Q6H PRN Edmisten,  Kristie L, PA   500 mg at 10/26/21 2222   Or   methocarbamol (ROBAXIN) 500 mg in dextrose 5 % 50 mL IVPB  500 mg Intravenous Q6H PRN Edmisten, Kristie L, PA       metoCLOPramide (REGLAN) tablet 5-10 mg  5-10 mg Oral Q8H PRN Edmisten, Kristie L, PA       Or   metoCLOPramide (REGLAN) injection 5-10 mg  5-10 mg Intravenous Q8H PRN Edmisten, Kristie L, PA       morphine (PF) 2 MG/ML injection 0.5-1 mg  0.5-1 mg Intravenous Q2H PRN Edmisten, Kristie L, PA   1 mg at 10/26/21 1458   ondansetron (ZOFRAN) tablet 4 mg  4 mg Oral Q6H PRN Edmisten, Kristie L, PA       Or   ondansetron (ZOFRAN) injection 4 mg  4 mg Intravenous Q6H PRN Edmisten, Kristie L, PA       oxyCODONE (Oxy IR/ROXICODONE) immediate release tablet 5-10 mg  5-10 mg Oral Q4H PRN Edmisten, Kristie L, PA   10 mg at 10/27/21 0948   polyethylene glycol (MIRALAX / GLYCOLAX) packet 17 g  17 g Oral Daily PRN Edmisten, Kristie L, PA       pravastatin (PRAVACHOL) tablet 40 mg  40 mg Oral Daily Edmisten, Kristie L, PA   40 mg at 10/27/21 0949   sodium phosphate (FLEET) 7-19 GM/118ML enema 1 enema  1 enema Rectal Once PRN Edmisten, Kristie L, PA       traMADol (ULTRAM) tablet 50-100 mg  50-100 mg Oral Q6H PRN Edmisten, Kristie L, PA         Discharge Medications: Please see discharge summary for a list of discharge medications.  Relevant Imaging Results:  Relevant Lab Results:   Additional Information SSN: [id number]  Sherie Don, LCSW

## 2021-10-27 NOTE — Progress Notes (Signed)
Physical Therapy Treatment Patient Details Name: [name] MRN: [id number] DOB: 09-22-1940 Today's Date: 10/27/2021   History of Present Illness [name], 81 y.o. female, who has a history of two knee replacements for the left knee. S/P left TRK revision 10/26/21. PMH: R TKA and back surgery    PT Comments    POD # 1 pm session Assisted OOB to amb in hallway even further.  Assisted to bathroom per pt request to "wash up".  Pt completing turns and static standing at sink all at Supervision level.  Assisted back to bed. Pt confirmed, she will be D/Cing back home tomorrow.  Pt will need a RW .   Recommendations for follow up therapy are one component of a multi-disciplinary discharge planning process, led by the attending physician.  Recommendations may be updated based on patient status, additional functional criteria and insurance authorization.  Follow Up Recommendations  Follow physician's recommendations for discharge plan and follow up therapies     Assistance Recommended at Discharge Frequent or constant Supervision/Assistance  Patient can return home with the following A lot of help with walking and/or transfers;A lot of help with bathing/dressing/bathroom;Assist for transportation;Help with stairs or ramp for entrance;Assistance with cooking/housework   Equipment Recommendations  Rolling walker (2 wheels)    Recommendations for Other Services       Precautions / Restrictions Precautions Precautions: Fall Precaution Comments: instructed no pillow under knee Restrictions Weight Bearing Restrictions: No LLE Weight Bearing: Weight bearing as tolerated     Mobility  Bed Mobility Overal bed mobility: Needs Assistance Bed Mobility: Sit to Supine, Supine to Sit           General bed mobility comments: pt self able to get OOB and back into bed with increased time.  Self able to perform leg raise.    Transfers Overall transfer level: Needs assistance Equipment  used: Rolling walker (2 wheels) Transfers: Sit to/from Stand Sit to Stand: Supervision           General transfer comment: 25% VC's on proper hand placement and safety with turns.  Also assisted with a toilet transfer.  Pt self able to perform peri care.    Ambulation/Gait Ambulation/Gait assistance: Supervision, Min guard Gait Distance (Feet): 45 Feet Assistive device: Rolling walker (2 wheels) Gait Pattern/deviations: Step-to pattern, Decreased stance time - left Gait velocity: decreased     General Gait Details: 25%  VC's on proper walker to self distance and safety with turns.  Tolerated an increased distance in hallway.   Stairs Stairs:  (NO stairs to enter [facility])           Wheelchair Mobility    Modified Rankin (Stroke Patients Only)       Balance                                            Cognition Arousal/Alertness: Awake/alert Behavior During Therapy: Anxious, WFL for tasks assessed/performed Overall Cognitive Status: Within Functional Limits for tasks assessed                                 General Comments: AxO x 3 very pleasant Lady who lives in [place] in a [facility] alone        Exercises      General Comments  Pertinent Vitals/Pain Pain Assessment Pain Score: 5  Pain Location: left knee Pain Descriptors / Indicators: Burning, Aching, Discomfort Pain Intervention(s): Monitored during session, Repositioned, Ice applied    Home Living                          Prior Function            PT Goals (current goals can now be found in the care plan section) Progress towards PT goals: Progressing toward goals    Frequency    7X/week      PT Plan Current plan remains appropriate    Co-evaluation              AM-PAC PT "6 Clicks" Mobility   Outcome Measure  Help needed turning from your back to your side while in a flat bed without using bedrails?: A Little Help  needed moving from lying on your back to sitting on the side of a flat bed without using bedrails?: A Little Help needed moving to and from a bed to a chair (including a wheelchair)?: A Little Help needed standing up from a chair using your arms (e.g., wheelchair or bedside chair)?: A Little Help needed to walk in hospital room?: A Little Help needed climbing 3-5 steps with a railing? : A Lot 6 Click Score: 17    End of Session Equipment Utilized During Treatment: Gait belt Activity Tolerance: Patient limited by pain Patient left: in bed Nurse Communication: Mobility status PT Visit Diagnosis: Unsteadiness on feet (R26.81);Muscle weakness (generalized) (M62.81);Difficulty in walking, not elsewhere classified (R26.2);Pain Pain - Right/Left: Left Pain - part of body: Knee     Time: 1449-1515 PT Time Calculation (min) (ACUTE ONLY): 26 min  Charges:  $Gait Training: 8-22 mins $Therapeutic Activity: 8-22 mins                     {Irine Heminger  PTA Acute  Rehabilitation Services Pager      [phone] Office      [phone]

## 2021-10-27 NOTE — Op Note (Signed)
[name], [place] MEDICAL RECORD NO: [id number] ACCOUNT NO: [id number] DATE OF BIRTH: December 21, 1940 FACILITY: [facility]ATION: [id number] PHYSICIAN: Dione Plover. Rainelle Sulewski, MD  Operative Report   DATE OF PROCEDURE: 10/26/2021  PREOPERATIVE DIAGNOSIS:  Failed left total knee arthroplasty.  POSTOPERATIVE DIAGNOSIS:  Failed left total knee arthroplasty.  PROCEDURE:  Left knee femoral component revision.  SURGEON:  Dione Plover. Dimitry Holsworth, MD  ASSISTANT:  Theresa Duty, PA-C  ANESTHESIA:  Adductor canal block and spinal.  ESTIMATED BLOOD LOSS:  25 mL.  DRAIN:  None.  COMPLICATIONS:  None.  TOURNIQUET TIME:  75 minutes at 300 mmHg.  CONDITION:  Stable to recovery.  BRIEF CLINICAL NOTE:  The patient is an 81 year old female who has had a previous left total knee arthroplasty revision over 10 years ago.  She did very well and then recently started getting some distal thigh pain.  Her plain x-rays were suggestive of  loosening confirmed by bone scan. She had a negative infection workup.  She presents now for femoral versus total knee arthroplasty revision.  PROCEDURE IN DETAIL:  After successful administration of adductor canal block and spinal, a tourniquet was placed high on her left thigh and her left lower extremity prepped and draped in the usual sterile fashion.  Extremity was wrapped an Esmarch,  tourniquet inflated to 300 mmHg.  Midline incision was made with a 10 blade through subcutaneous tissue to the extensor mechanism.  Fresh blade was used to make a medial parapatellar arthrotomy.  There was minimal if any fluid present.  Soft tissue over  the proximal medial tibia subperiosteally elevated to the joint line with a knife and into the semimembranosus bursa with a Cobb elevator.  Soft tissue laterally was elevated with attention being paid to avoid the patellar tendon on tibial tubercle.  We  removed a large amount of scar tissue from the medial and lateral gutters and suprapatellar area as  well as the infrapatellar area.  After removing the scar tissue, I was able to evert the patella and flex it greater than 90 degrees.  We subluxed the  tibia forward to remove the tibial polyethylene.  The tibial component was well fixed and well positioned, so did not need to be revised.  On the femoral side, there was a small gap between the femoral component and bone, especially up at the anterior  flange.  Osteotome was used to disrupt the interface between the prosthesis and bone and then I was able to disimpact the femoral component very easily from the femoral canal.  There was no bone loss noted.  There was membranous tissue present over some  of the bone on the distal femur that is all removed and debrided back to normal tissue and normal bone.  We then accessed the femoral canal and thoroughly irrigated.  I reamed up sequentially from 9 mm up until 18 mm, which had a good press fit in the  canal.  The distal femoral cutting block is then placed off the 18 mm reamer, which served as our intramedullary alignment guide.  I removed 4 mm from the medial and lateral sides to get a flat platform.  I was going to use 8 mm distal augments to make up  for the 4 mm resection plus add 4 more to get the joint line back in more normal position.  Size 3 is the most appropriate femoral component for the Sigma revision system.  We then placed a size 3 cutting block with the rotation marked using  the  epicondylar axis and by creating a rectangular flexion gap at 90 degrees.  I did not remove any bony anterior, posterior, or any chamfers.  We completed the femoral preparation with the intercondylar block for the TC3 component.  I also prepared for a  sleeve for rotational control on this.  We got up to a 31 sleeve, which had excellent rotational purchase in the distal femur.  Trial was then made on the femoral side.  Size 3 TC3 femur with 8 mm distal augments medial and lateral, 8 mm posterior  augments medial and  lateral and a stem, which is 18 x 110 and this was placed in the +2 position in 5 degrees of valgus.  Once again, the tibial side was normal.  We got up to a 22.5 mm insert, which had a small amount of varus valgus and AP laxity, so  went to 25, which allowed for full extension with excellent stability throughout full range of motion.  The patella tracked normally.  We then removed the trials and prepared the bone with pulsatile lavage.  We mixed the cement after assembling the  component on the back table.  When the cement was ready, then we placed the cement distally for femoral component with a porous coated sleeve and a press-fit stem.  The femoral component again size 3 TC3 with 8 mm augments medial and lateral distally, 8  mm augments medial and lateral posteriorly and the stem is in the +2 position and the stem is 18 x 75.  The sleeve was a size 31 and that is placed in the same rotation we had with the trial.  The femoral component is impacted and all extruded cement  removed.  Trial 25 insert was placed, knee held in full extension with excellent varus, valgus, and anterior, posterior balance throughout full range of motion.  When the cement hardened, the trial was removed and a permanent 25 mm TC3 rotating platform  insert was placed into the tibial tray.  The wound was copiously irrigated with saline solution and the arthrotomy closed with a 0 Stratafix suture.  Flexion against gravity was about 125 to 130 degrees.  Tourniquet was then released, total time of 75  minutes.  Subcutaneous was then closed with interrupted 2-0 Vicryl and subcuticular running 4-0 Monocryl.  The incisions cleaned and dried and a bulky sterile dressing applied.  The patient was awakened and transported to recovery in stable condition.  Note that a surgical assistant was of medical necessity for this procedure to do it in a safe and expeditious manner.  Surgical assistant necessary for retraction of vital ligaments and  neurovascular structures and for proper positioning of the limb, for  removal of the old implant and safe and accurate placement of new implant.   NIK D: 10/26/2021 11:41:37 am T: 10/27/2021 1:18:00 am  JOB: [id number]/ [id number]

## 2021-10-28 ENCOUNTER — Other Ambulatory Visit: Payer: Self-pay | Admitting: Internal Medicine

## 2021-10-28 LAB — BASIC METABOLIC PANEL
Anion gap: 6 (ref 5–15)
BUN: 35 mg/dL — ABNORMAL HIGH (ref 8–23)
CO2: 29 mmol/L (ref 22–32)
Calcium: 9.3 mg/dL (ref 8.9–10.3)
Chloride: 101 mmol/L (ref 98–111)
Creatinine, Ser: 1.06 mg/dL — ABNORMAL HIGH (ref 0.44–1.00)
GFR, Estimated: 53 mL/min — ABNORMAL LOW (ref >60)
Glucose, Bld: 121 mg/dL — ABNORMAL HIGH (ref 70–99)
Potassium: 4.1 mmol/L (ref 3.5–5.1)
Sodium: 136 mmol/L (ref 135–145)

## 2021-10-28 LAB — CBC
HCT: 35.5 % — ABNORMAL LOW (ref 36.0–46.0)
Hemoglobin: 11.6 g/dL — ABNORMAL LOW (ref 12.0–15.0)
MCH: 31.9 pg (ref 26.0–34.0)
MCHC: 32.7 g/dL (ref 30.0–36.0)
MCV: 97.5 fL (ref 80.0–100.0)
Platelets: 214 10*3/uL (ref 150–400)
RBC: 3.64 MIL/uL — ABNORMAL LOW (ref 3.87–5.11)
RDW: 13.2 % (ref 11.5–15.5)
WBC: 11.4 10*3/uL — ABNORMAL HIGH (ref 4.0–10.5)
nRBC: 0 % (ref 0.0–0.2)

## 2021-10-28 MED ORDER — ALUM & MAG HYDROXIDE-SIMETH 200-200-20 MG/5ML PO SUSP
30.0000 mL | ORAL | Status: DC | PRN
  Administered 2021-10-28: 30 mL via ORAL
  Filled 2021-10-28: qty 30

## 2021-10-28 NOTE — Progress Notes (Signed)
Subjective: 2 Days Post-Op Procedure(s) (LRB): Left knee femoral vs total knee arthroplasty revision (Left) Patient reports pain as moderate.   Patient seen in rounds for Dr. Wynelle Link. Patient is well, and has had no acute complaints or problems, other than being in pain. Denies SOB, chest pain, or calf pain. No acute overnight events. Ambulated 45 feet with therapy yesterday. Will continue therapy today.   Plan is to go Home after hospital stay.  Objective: Vital signs in last 24 hours: Temp:  [97.6 F (36.4 C)-98.5 F (36.9 C)] 97.7 F (36.5 C) (02/17 0548) Pulse Rate:  [59-69] 66 (02/17 0548) Resp:  [16] 16 (02/17 0548) BP: (135-165)/(61-78) 165/78 (02/17 0548) SpO2:  [91 %-100 %] 100 % (02/17 0548)  Intake/Output from previous day:  Intake/Output Summary (Last 24 hours) at 10/28/2021 0755 Last data filed at 10/28/2021 0600 Gross per 24 hour  Intake 1315.8 ml  Output 300 ml  Net 1015.8 ml    Intake/Output this shift: No intake/output data recorded.  Labs: Recent Labs    10/27/21 0326 10/28/21 0319  HGB 11.6* 11.6*   Recent Labs    10/27/21 0326 10/28/21 0319  WBC 10.8* 11.4*  RBC 3.76* 3.64*  HCT 36.7 35.5*  PLT 212 214   Recent Labs    10/27/21 0326 10/28/21 0319  NA 136 136  K 5.5* 4.1  CL 103 101  CO2 29 29  BUN 30* 35*  CREATININE 1.14* 1.06*  GLUCOSE 146* 121*  CALCIUM 9.0 9.3   No results for input(s): LABPT, INR in the last 72 hours.  Exam: General - Patient is Alert and Oriented Extremity - Neurologically intact Neurovascular intact Intact pulses distally Dorsiflexion/Plantar flexion intact Dressing/Incision - clean, dry, no drainage Motor Function - intact, moving foot and toes well on exam.   Past Medical History:  Diagnosis Date   Adenomatous colon polyp 03/2003   Anxiety    ativan for sleep   Baker's cyst    Degenerative lumbar disc    Dr. Nelva Bush, epidural injections   Goiter    History of elevated homocysteine     Hypertension 09/11/2005   Lumbar radiculopathy    Dr. Nelva Bush, epidureal injections   Lumbosacral radiculopathy    seeing Pain Management   OA (osteoarthritis)     knee pain after TKR left , back pain- last steroid injection back 2 months ago Dr Nelva Bush   PONV (postoperative nausea and vomiting)    Postlaminectomy syndrome, lumbar region    Dr. Nelva Bush, recommended doing bilateral S1 transforaminal epidural with D5W   Thyroid nodule    rt benign colloid nodule--left hyperplastic nodule- Thyroid bx 2006: neg    Assessment/Plan: 2 Days Post-Op Procedure(s) (LRB): Left knee femoral vs total knee arthroplasty revision (Left) Principal Problem:   Failed total knee arthroplasty ([place]) Active Problems:   Failed total left knee replacement ([facility])  Estimated body mass index is 28.84 kg/m as calculated from the following:   Height as of this encounter: 5\' 4"  (1.626 m).   Weight as of this encounter: 76.2 kg. Up with therapy  DVT Prophylaxis - Aspirin and TED hose Weight-bearing as tolerated  Will continue to monitor patient's progress today, and if significant progress is not mad with therapy, patient may require SNF placement.   Patient to follow up in two weeks with Dr. Wynelle Link in clinic.   The PDMP database was reviewed today prior to any opioid medications being prescribed to this patient.   Fenton Foy, MBA, PA-C Orthopedic  Surgery [phone] 10/28/2021, 7:55 AM

## 2021-10-28 NOTE — Plan of Care (Signed)
Patient discharged, all care plans complete

## 2021-10-28 NOTE — TOC Transition Note (Signed)
Transition of Care [facility]) - CM/SW Discharge Note  Patient Details  Name: [name] MRN: [id number] Date of Birth: [birth year]  [facility] CM/SW Contact:  Sherie Don, LCSW Phone Number: 10/28/2021, 10:46 AM  Clinical Narrative: Patient wants to discharge home with OPPT, so [employer] delivered rolling walker to patient's room. TOC signing off.  Final next level of care: OP Rehab Barriers to Discharge: Barriers Resolved  Patient Goals and CMS Choice Patient states their goals for this hospitalization and ongoing recovery are:: Go home today CMS Medicare.gov Compare Post Acute Care list provided to:: Patient Choice offered to / list presented to : Patient  Discharge Plan and Services In-house Referral: Clinical Social Work Post Acute Care Choice: [place]          DME Arranged: Gilford Rile rolling DME Agency: [employer] Representative spoke with at DME Agency: Prearranged in orthopedist's office  Readmission Risk Interventions No flowsheet data found.

## 2021-10-28 NOTE — Progress Notes (Signed)
Physical Therapy Treatment Patient Details Name: [name] MRN: [id number] DOB: 01-25-1941 Today's Date: 10/28/2021   History of Present Illness [name], 81 y.o. female, who has a history of two knee replacements for the left knee. S/P left TRK revision 10/26/21. PMH: R TKA and back surgery    PT Comments    POD # 2 am session General Comments: AxO x 3 very pleasant Lady who lives in [place] in a [place] alone.  Pt less anxious today and eager to go home.  Pt plans to return home with initial support from Daughter in [place].  Assisted OOB to amb went well.  General bed mobility comments: pt self able to get OOB and back into bed with increased time.  Self able to perform leg raise. General transfer comment: 25% VC's to extend LE prior to sit to avoid increased pain.  One VC on safety with turns.  Good use of hands to steady self. General Gait Details: tolerated an increased distance.  Slow and steady pace.  one VC on safety with turns and back gait.Then returned to room to perform some TE's following HEP handout.  Instructed on proper tech, freq as well as use of ICE.   Addressed all mobility questions, discussed appropriate activity, educated on use of ICE.  Pt ready for D/C to home.   Recommendations for follow up therapy are one component of a multi-disciplinary discharge planning process, led by the attending physician.  Recommendations may be updated based on patient status, additional functional criteria and insurance authorization.  Follow Up Recommendations  Outpatient PT     Assistance Recommended at Discharge Frequent or constant Supervision/Assistance  Patient can return home with the following A lot of help with walking and/or transfers;A lot of help with bathing/dressing/bathroom;Assist for transportation;Help with stairs or ramp for entrance;Assistance with cooking/housework   Equipment Recommendations  Rolling walker (2 wheels)    Recommendations for Other Services        Precautions / Restrictions Precautions Precautions: Fall Precaution Comments: instructed no pillow under knee Restrictions Weight Bearing Restrictions: No LLE Weight Bearing: Weight bearing as tolerated     Mobility  Bed Mobility Overal bed mobility: Needs Assistance Bed Mobility: Supine to Sit     Supine to sit: Supervision     General bed mobility comments: pt self able to get OOB and back into bed with increased time.  Self able to perform leg raise.    Transfers Overall transfer level: Needs assistance Equipment used: Rolling walker (2 wheels) Transfers: Sit to/from Stand Sit to Stand: Supervision           General transfer comment: 25% VC's to extend LE prior to sit to avoid increased pain.  One VC on safety with turns.  Good use of hands to steady self.    Ambulation/Gait Ambulation/Gait assistance: Supervision Gait Distance (Feet): 115 Feet Assistive device: Rolling walker (2 wheels) Gait Pattern/deviations: Step-to pattern, Decreased stance time - left Gait velocity: decreased     General Gait Details: tolerated an increased distance.  Slow and steady pace.  one VC on safety with turns and back gait.   Stairs             Wheelchair Mobility    Modified Rankin (Stroke Patients Only)       Balance  Cognition Arousal/Alertness: Awake/alert Behavior During Therapy: WFL for tasks assessed/performed Overall Cognitive Status: Within Functional Limits for tasks assessed                                 General Comments: AxO x 3 very pleasant Lady who lives in [place] in a [facility] alone        Exercises  Total Knee Replacement TE's following HEP handout 10 reps B LE ankle pumps 05 reps towel squeezes 05 reps knee presses 05 reps heel slides  05 reps SAQ's 05 reps SLR's 05 reps ABD Educated on use of gait belt to assist with TE's Followed by ICE      General Comments        Pertinent Vitals/Pain Pain Assessment Pain Assessment: 0-10 Pain Score: 4  Pain Location: left knee Pain Descriptors / Indicators: Burning, Aching, Discomfort, Operative site guarding Pain Intervention(s): Monitored during session, Patient requesting pain meds-RN notified, Ice applied, Repositioned    Home Living                          Prior Function            PT Goals (current goals can now be found in the care plan section) Progress towards PT goals: Progressing toward goals    Frequency    7X/week      PT Plan Current plan remains appropriate    Co-evaluation              AM-PAC PT "6 Clicks" Mobility   Outcome Measure  Help needed turning from your back to your side while in a flat bed without using bedrails?: None Help needed moving from lying on your back to sitting on the side of a flat bed without using bedrails?: None Help needed moving to and from a bed to a chair (including a wheelchair)?: None Help needed standing up from a chair using your arms (e.g., wheelchair or bedside chair)?: None Help needed to walk in hospital room?: A Little Help needed climbing 3-5 steps with a railing? : A Little 6 Click Score: 22    End of Session Equipment Utilized During Treatment: Gait belt Activity Tolerance: Patient tolerated treatment well Patient left: in chair;with call bell/phone within reach Nurse Communication: Mobility status PT Visit Diagnosis: Unsteadiness on feet (R26.81);Muscle weakness (generalized) (M62.81);Difficulty in walking, not elsewhere classified (R26.2);Pain Pain - Right/Left: Left     Time: 7078-6754 PT Time Calculation (min) (ACUTE ONLY): 27 min  Charges:  $Gait Training: 8-22 mins $Therapeutic Exercise: 8-22 mins                     Rica Koyanagi  PTA Acute  Rehabilitation Services Pager      [phone] Office      [phone]

## 2021-10-28 NOTE — Plan of Care (Signed)
°  Problem: Education: Goal: Knowledge of the prescribed therapeutic regimen will improve Outcome: Progressing   Problem: Activity: Goal: Ability to avoid complications of mobility impairment will improve Outcome: Progressing   Problem: Skin Integrity: Goal: Will show signs of wound healing Outcome: Progressing   

## 2021-10-30 ENCOUNTER — Other Ambulatory Visit: Payer: Self-pay | Admitting: Internal Medicine

## 2021-10-31 DIAGNOSIS — M25662 Stiffness of left knee, not elsewhere classified: Secondary | ICD-10-CM | POA: Diagnosis not present

## 2021-10-31 DIAGNOSIS — R600 Localized edema: Secondary | ICD-10-CM | POA: Diagnosis not present

## 2021-10-31 DIAGNOSIS — R262 Difficulty in walking, not elsewhere classified: Secondary | ICD-10-CM | POA: Diagnosis not present

## 2021-10-31 DIAGNOSIS — M25562 Pain in left knee: Secondary | ICD-10-CM | POA: Diagnosis not present

## 2021-10-31 DIAGNOSIS — Z96652 Presence of left artificial knee joint: Secondary | ICD-10-CM | POA: Diagnosis not present

## 2021-10-31 DIAGNOSIS — Z4789 Encounter for other orthopedic aftercare: Secondary | ICD-10-CM | POA: Diagnosis not present

## 2021-11-02 DIAGNOSIS — R262 Difficulty in walking, not elsewhere classified: Secondary | ICD-10-CM | POA: Diagnosis not present

## 2021-11-02 DIAGNOSIS — R600 Localized edema: Secondary | ICD-10-CM | POA: Diagnosis not present

## 2021-11-02 DIAGNOSIS — M25562 Pain in left knee: Secondary | ICD-10-CM | POA: Diagnosis not present

## 2021-11-02 DIAGNOSIS — Z96652 Presence of left artificial knee joint: Secondary | ICD-10-CM | POA: Diagnosis not present

## 2021-11-02 DIAGNOSIS — M25662 Stiffness of left knee, not elsewhere classified: Secondary | ICD-10-CM | POA: Diagnosis not present

## 2021-11-02 DIAGNOSIS — Z4789 Encounter for other orthopedic aftercare: Secondary | ICD-10-CM | POA: Diagnosis not present

## 2021-11-08 ENCOUNTER — Ambulatory Visit: Payer: Medicare Other | Admitting: Internal Medicine

## 2021-11-08 NOTE — Discharge Summary (Signed)
Physician Discharge Summary   Patient ID: [name] MRN: [id number] DOB/AGE: May 11, 1941 81 y.o.  Admit date: 10/26/2021 Discharge date: 10/28/2021  Primary Diagnosis: OA of left knee  Admission Diagnoses:  Past Medical History:  Diagnosis Date   Adenomatous colon polyp 03/2003   Anxiety    ativan for sleep   Baker's cyst    Degenerative lumbar disc    Dr. Nelva Bush, epidural injections   Goiter    History of elevated homocysteine    Hypertension 09/11/2005   Lumbar radiculopathy    Dr. Nelva Bush, epidureal injections   Lumbosacral radiculopathy    seeing Pain Management   OA (osteoarthritis)     knee pain after TKR left , back pain- last steroid injection back 2 months ago Dr Nelva Bush   PONV (postoperative nausea and vomiting)    Postlaminectomy syndrome, lumbar region    Dr. Nelva Bush, recommended doing bilateral S1 transforaminal epidural with D5W   Thyroid nodule    rt benign colloid nodule--left hyperplastic nodule- Thyroid bx 2006: neg   Discharge Diagnoses:   Principal Problem:   Failed total knee arthroplasty ([place]) Active Problems:   Failed total left knee replacement ([place])  Estimated body mass index is 28.84 kg/m as calculated from the following:   Height as of this encounter: 5\' 4"  (1.626 m).   Weight as of this encounter: 76.2 kg.  Procedure:  Procedure(s) (LRB): Left knee femoral vs total knee arthroplasty revision (Left)   Consults: None  HPI: The patient is an 81 year old female who has had a previous left total knee arthroplasty revision over 10 years ago.  She did very well and then recently started getting some distal thigh pain.  Her plain x-rays were suggestive of  loosening confirmed by bone scan. She had a negative infection workup.  She presents now for femoral versus total knee arthroplasty revision.  Laboratory Data: Admission on 10/26/2021, Discharged on 10/28/2021  Component Date Value Ref Range Status   WBC 10/27/2021 10.8 (H)  4.0 - 10.5 K/uL Final    RBC 10/27/2021 3.76 (L)  3.87 - 5.11 MIL/uL Final   Hemoglobin 10/27/2021 11.6 (L)  12.0 - 15.0 g/dL Final   HCT 10/27/2021 36.7  36.0 - 46.0 % Final   MCV 10/27/2021 97.6  80.0 - 100.0 fL Final   MCH 10/27/2021 30.9  26.0 - 34.0 pg Final   MCHC 10/27/2021 31.6  30.0 - 36.0 g/dL Final   RDW 10/27/2021 13.1  11.5 - 15.5 % Final   Platelets 10/27/2021 212  150 - 400 K/uL Final   nRBC 10/27/2021 0.0  0.0 - 0.2 % Final   Performed at [facility], [place] [address], [place], [place] [address]   Sodium 10/27/2021 136  135 - 145 mmol/L Final   Potassium 10/27/2021 5.5 (H)  3.5 - 5.1 mmol/L Final   Chloride 10/27/2021 103  98 - 111 mmol/L Final   CO2 10/27/2021 29  22 - 32 mmol/L Final   Glucose, Bld 10/27/2021 146 (H)  70 - 99 mg/dL Final   Glucose reference range applies only to samples taken after fasting for at least 8 hours.   BUN 10/27/2021 30 (H)  8 - 23 mg/dL Final   Creatinine, Ser 10/27/2021 1.14 (H)  0.44 - 1.00 mg/dL Final   Calcium 10/27/2021 9.0  8.9 - 10.3 mg/dL Final   GFR, Estimated 10/27/2021 49 (L)  >60 mL/min Final   Comment: (NOTE) Calculated using the CKD-EPI Creatinine Equation (2021)  Anion gap 10/27/2021 4 (L)  5 - 15 Final   Performed at [facility], [place] [facility] [place] [address], [place], [place] [address]   WBC 10/28/2021 11.4 (H)  4.0 - 10.5 K/uL Final   RBC 10/28/2021 3.64 (L)  3.87 - 5.11 MIL/uL Final   Hemoglobin 10/28/2021 11.6 (L)  12.0 - 15.0 g/dL Final   HCT 10/28/2021 35.5 (L)  36.0 - 46.0 % Final   MCV 10/28/2021 97.5  80.0 - 100.0 fL Final   MCH 10/28/2021 31.9  26.0 - 34.0 pg Final   MCHC 10/28/2021 32.7  30.0 - 36.0 g/dL Final   RDW 10/28/2021 13.2  11.5 - 15.5 % Final   Platelets 10/28/2021 214  150 - 400 K/uL Final   nRBC 10/28/2021 0.0  0.0 - 0.2 % Final   Performed at [facility], [place] [address], [place], [place] [address]   Sodium 10/28/2021 136  135 - 145 mmol/L Final   Potassium 10/28/2021 4.1   3.5 - 5.1 mmol/L Final   Chloride 10/28/2021 101  98 - 111 mmol/L Final   CO2 10/28/2021 29  22 - 32 mmol/L Final   Glucose, Bld 10/28/2021 121 (H)  70 - 99 mg/dL Final   Glucose reference range applies only to samples taken after fasting for at least 8 hours.   BUN 10/28/2021 35 (H)  8 - 23 mg/dL Final   Creatinine, Ser 10/28/2021 1.06 (H)  0.44 - 1.00 mg/dL Final   Calcium 10/28/2021 9.3  8.9 - 10.3 mg/dL Final   GFR, Estimated 10/28/2021 53 (L)  >60 mL/min Final   Comment: (NOTE) Calculated using the CKD-EPI Creatinine Equation (2021)    Anion gap 10/28/2021 6  5 - 15 Final   Performed at [facility], [place] [address], [place], [place] [address]  Hospital Outpatient Visit on 10/24/2021  Component Date Value Ref Range Status   SARS Coronavirus 2 10/24/2021 NEGATIVE  NEGATIVE Final   Comment: (NOTE) SARS-CoV-2 target nucleic acids are NOT DETECTED.  The SARS-CoV-2 RNA is generally detectable in upper and lower respiratory specimens during the acute phase of infection. Negative results do not preclude SARS-CoV-2 infection, do not rule out co-infections with other pathogens, and should not be used as the sole basis for treatment or other patient management decisions. Negative results must be combined with clinical observations, patient history, and epidemiological information. The expected result is Negative.  Fact Sheet for Patients: SugarRoll.be  Fact Sheet for Healthcare Providers: https://www.woods-mathews.com/  This test is not yet approved or cleared by the [place] FDA and  has been authorized for detection and/or diagnosis of SARS-CoV-2 by FDA under an Emergency Use Authorization (EUA). This EUA will remain  in effect (meaning this test can be used) for the duration of the COVID-19 declaration under Se                          ction 564(b)(1) of the Act, 21 U.S.C. section 360bbb-3(b)(1), unless the  authorization is terminated or revoked sooner.  Performed at [facility], [place] [address] [place], [place] [address]   Hospital Outpatient Visit on 10/13/2021  Component Date Value Ref Range Status   MRSA, PCR 10/13/2021 NEGATIVE  NEGATIVE Final   Staphylococcus aureus 10/13/2021 NEGATIVE  NEGATIVE Final   Comment: (NOTE) The Xpert SA Assay (FDA approved for NASAL specimens in patients 37 years of age and older), is one component of a comprehensive  surveillance program. It is not intended to diagnose infection nor to guide or monitor treatment. Performed at [facility], [place] [address] [place], [place] [address]    WBC 10/13/2021 8.9  4.0 - 10.5 K/uL Final   RBC 10/13/2021 4.37  3.87 - 5.11 MIL/uL Final   Hemoglobin 10/13/2021 13.7  12.0 - 15.0 g/dL Final   HCT 10/13/2021 42.7  36.0 - 46.0 % Final   MCV 10/13/2021 97.7  80.0 - 100.0 fL Final   MCH 10/13/2021 31.4  26.0 - 34.0 pg Final   MCHC 10/13/2021 32.1  30.0 - 36.0 g/dL Final   RDW 10/13/2021 12.9  11.5 - 15.5 % Final   Platelets 10/13/2021 296  150 - 400 K/uL Final   nRBC 10/13/2021 0.0  0.0 - 0.2 % Final   Performed at [facility], [place] [address], [place], [place] [address]   Sodium 10/13/2021 137  135 - 145 mmol/L Final   Potassium 10/13/2021 4.6  3.5 - 5.1 mmol/L Final   Chloride 10/13/2021 100  98 - 111 mmol/L Final   CO2 10/13/2021 29  22 - 32 mmol/L Final   Glucose, Bld 10/13/2021 116 (H)  70 - 99 mg/dL Final   Glucose reference range applies only to samples taken after fasting for at least 8 hours.   BUN 10/13/2021 29 (H)  8 - 23 mg/dL Final   Creatinine, Ser 10/13/2021 1.36 (H)  0.44 - 1.00 mg/dL Final   Calcium 10/13/2021 9.5  8.9 - 10.3 mg/dL Final   Total Protein 10/13/2021 7.0  6.5 - 8.1 g/dL Final   Albumin 10/13/2021 4.5  3.5 - 5.0 g/dL Final   AST 10/13/2021 28  15 - 41 U/L Final   ALT 10/13/2021 16  0 - 44 U/L Final   Alkaline Phosphatase 10/13/2021 63  38  - 126 U/L Final   Total Bilirubin 10/13/2021 0.3  0.3 - 1.2 mg/dL Final   GFR, Estimated 10/13/2021 39 (L)  >60 mL/min Final   Comment: (NOTE) Calculated using the CKD-EPI Creatinine Equation (2021)    Anion gap 10/13/2021 8  5 - 15 Final   Performed at [place] [address], [place], [place] [address]   ABO/RH(D) 10/13/2021 B NEG   Final   Antibody Screen 10/13/2021 NEG   Final   Sample Expiration 10/13/2021 10/27/2021,2359   Final   Extend sample reason 10/13/2021    Final                   Value:NO TRANSFUSIONS OR PREGNANCY IN THE PAST 3 MONTHS Performed at [place] [address], [place], [place] [address]    Prothrombin Time 10/13/2021 12.4  11.4 - 15.2 seconds Final   INR 10/13/2021 0.9  0.8 - 1.2 Final   Comment: (NOTE) INR goal varies based on device and disease states. Performed at [facility], [place] [address] [place], [place] [address]      X-Rays:No results found.  EKG: Orders placed or performed in visit on 07/11/21   EKG 12-Lead     Hospital Course: [name] is a 81 y.o. who was admitted to [facility]. They were brought to the operating room on 10/26/2021 and underwent Procedure(s): Left knee femoral vs total knee arthroplasty revision.  Patient tolerated the procedure well and was later transferred to the recovery room and then to the orthopaedic floor for postoperative care. They were given PO and IV  analgesics for pain control following their surgery. They were given 24 hours of postoperative antibiotics of  Anti-infectives (From admission, onward)    Start     Dose/Rate Route Frequency Ordered Stop   10/26/21 1430  ceFAZolin (ANCEF) IVPB 2g/100 mL premix        2 g 200 mL/hr over 30 Minutes Intravenous Every 6 hours 10/26/21 1308 10/26/21 2258   10/26/21 0615  ceFAZolin (ANCEF) IVPB 2g/100 mL premix        2 g 200 mL/hr over 30 Minutes Intravenous On call to O.R. 10/26/21  [id number] 10/26/21 0834      and started on DVT prophylaxis in the form of Aspirin and TED hose.   PT and OT were ordered for total joint protocol. Discharge planning consulted to help with postop disposition and equipment needs.  Patient had an uneventful night on the evening of surgery. They started to get up OOB with therapy on POD#1 and POD#2. Pt was seen during rounds and was ready to go home pending progress with therapy. She worked with therapy on POD #2 and was meeting goals. Pt was discharged to home later that day in stable condition.  Diet: Regular diet Activity: WBAT Follow-up: in 2 weeks Disposition: Home Discharged Condition: good   Discharge Instructions     Call MD / Call 911   Complete by: As directed    If you experience chest pain or shortness of breath, CALL 911 and be transported to the hospital emergency room.  If you develope a fever above 101 F, pus (white drainage) or increased drainage or redness at the wound, or calf pain, call your surgeon's office.   Change dressing   Complete by: As directed    You may remove the bulky bandage (ACE wrap and gauze) two days after surgery. You will have an adhesive waterproof bandage underneath. Leave this in place until your first follow-up appointment.   Constipation Prevention   Complete by: As directed    Drink plenty of fluids.  Prune juice may be helpful.  You may use a stool softener, such as Colace (over the counter) 100 mg twice a day.  Use MiraLax (over the counter) for constipation as needed.   Diet - low sodium heart healthy   Complete by: As directed    Do not put a pillow under the knee. Place it under the heel.   Complete by: As directed    Driving restrictions   Complete by: As directed    No driving for two weeks   Post-operative opioid taper instructions:   Complete by: As directed    POST-OPERATIVE OPIOID TAPER INSTRUCTIONS: It is important to wean off of your opioid medication as soon as possible. If you do  not need pain medication after your surgery it is ok to stop day one. Opioids include: Codeine, Hydrocodone(Norco, Vicodin), Oxycodone(Percocet, oxycontin) and hydromorphone amongst others.  Long term and even short term use of opiods can cause: Increased pain response Dependence Constipation Depression Respiratory depression And more.  Withdrawal symptoms can include Flu like symptoms Nausea, vomiting And more Techniques to manage these symptoms Hydrate well Eat regular healthy meals Stay active Use relaxation techniques(deep breathing, meditating, yoga) Do Not substitute Alcohol to help with tapering If you have been on opioids for less than two weeks and do not have pain than it is ok to stop all together.  Plan to wean off of opioids This plan should start within one week post op of your  joint replacement. Maintain the same interval or time between taking each dose and first decrease the dose.  Cut the total daily intake of opioids by one tablet each day Next start to increase the time between doses. The last dose that should be eliminated is the evening dose.      TED hose   Complete by: As directed    Use stockings (TED hose) for three weeks on both leg(s).  You may remove them at night for sleeping.   Weight bearing as tolerated   Complete by: As directed       Allergies as of 10/28/2021   No Known Allergies      Medication List     STOP taking these medications    HYDROcodone-acetaminophen 10-325 MG tablet Commonly known as: NORCO       TAKE these medications    aspirin 325 MG EC tablet Take 1 tablet (325 mg total) by mouth 2 (two) times daily for 19 days. Then take one 81 mg aspirin once a day for three weeks. Then discontinue aspirin.   azelastine 0.1 % nasal spray Commonly known as: ASTELIN Place 2 sprays into both nostrils at bedtime as needed for rhinitis or allergies.   CALCIUM + D PO Take 1 tablet by mouth daily.   ezetimibe 10 MG  tablet Commonly known as: Zetia Take 1 tablet (10 mg total) by mouth daily.   gabapentin 800 MG tablet Commonly known as: NEURONTIN TAKE 1 TABLET(800 MG) BY MOUTH FOUR TIMES DAILY   Lidocaine 4 % Ptch Apply 1 patch topically daily as needed (pain).   lisinopril-hydrochlorothiazide 20-12.5 MG tablet Commonly known as: ZESTORETIC Take 1 tablet by mouth daily.   MELATONIN PO Take 1 capsule by mouth at bedtime as needed (sleep).   methocarbamol 500 MG tablet Commonly known as: ROBAXIN Take 1 tablet (500 mg total) by mouth every 6 (six) hours as needed for muscle spasms.   ondansetron 4 MG tablet Commonly known as: ZOFRAN Take 1 tablet (4 mg total) by mouth every 6 (six) hours as needed for nausea.   oxyCODONE 5 MG immediate release tablet Commonly known as: Oxy IR/ROXICODONE Take 1-2 tablets (5-10 mg total) by mouth every 6 (six) hours as needed for severe pain.   pravastatin 40 MG tablet Commonly known as: PRAVACHOL TAKE 1 TABLET BY MOUTH EVERYDAY AT BEDTIME   traMADol 50 MG tablet Commonly known as: ULTRAM Take 1-2 tablets (50-100 mg total) by mouth every 6 (six) hours as needed for moderate pain.   zolpidem 5 MG tablet Commonly known as: AMBIEN TAKE 1 TABLET BY MOUTH AT BEDTIME AS NEEDED FOR SLEEP               Discharge Care Instructions  (From admission, onward)           Start     Ordered   10/28/21 0000  Weight bearing as tolerated        10/28/21 1056   10/28/21 0000  Change dressing       Comments: You may remove the bulky bandage (ACE wrap and gauze) two days after surgery. You will have an adhesive waterproof bandage underneath. Leave this in place until your first follow-up appointment.   10/28/21 1056            Follow-up Information     Gaynelle Arabian, MD. Schedule an appointment as soon as possible for a visit in 2 week(s).   Specialty: Orthopedic Surgery Contact information: [address]  [place]  [address] [phone]                 Signed: Fenton Foy, PA-C Orthopedic Surgery 11/08/2021, 11:25 PM

## 2021-11-09 DIAGNOSIS — Z96652 Presence of left artificial knee joint: Secondary | ICD-10-CM | POA: Diagnosis not present

## 2021-11-09 DIAGNOSIS — R600 Localized edema: Secondary | ICD-10-CM | POA: Diagnosis not present

## 2021-11-09 DIAGNOSIS — R262 Difficulty in walking, not elsewhere classified: Secondary | ICD-10-CM | POA: Diagnosis not present

## 2021-11-09 DIAGNOSIS — M25562 Pain in left knee: Secondary | ICD-10-CM | POA: Diagnosis not present

## 2021-11-09 DIAGNOSIS — M25662 Stiffness of left knee, not elsewhere classified: Secondary | ICD-10-CM | POA: Diagnosis not present

## 2021-11-09 DIAGNOSIS — Z4789 Encounter for other orthopedic aftercare: Secondary | ICD-10-CM | POA: Diagnosis not present

## 2021-11-11 DIAGNOSIS — Z96652 Presence of left artificial knee joint: Secondary | ICD-10-CM | POA: Diagnosis not present

## 2021-11-11 DIAGNOSIS — M25662 Stiffness of left knee, not elsewhere classified: Secondary | ICD-10-CM | POA: Diagnosis not present

## 2021-11-11 DIAGNOSIS — R600 Localized edema: Secondary | ICD-10-CM | POA: Diagnosis not present

## 2021-11-11 DIAGNOSIS — Z4789 Encounter for other orthopedic aftercare: Secondary | ICD-10-CM | POA: Diagnosis not present

## 2021-11-11 DIAGNOSIS — M25562 Pain in left knee: Secondary | ICD-10-CM | POA: Diagnosis not present

## 2021-11-11 DIAGNOSIS — R262 Difficulty in walking, not elsewhere classified: Secondary | ICD-10-CM | POA: Diagnosis not present

## 2021-11-14 DIAGNOSIS — Z4789 Encounter for other orthopedic aftercare: Secondary | ICD-10-CM | POA: Diagnosis not present

## 2021-11-14 DIAGNOSIS — R600 Localized edema: Secondary | ICD-10-CM | POA: Diagnosis not present

## 2021-11-14 DIAGNOSIS — Z96652 Presence of left artificial knee joint: Secondary | ICD-10-CM | POA: Diagnosis not present

## 2021-11-14 DIAGNOSIS — R262 Difficulty in walking, not elsewhere classified: Secondary | ICD-10-CM | POA: Diagnosis not present

## 2021-11-14 DIAGNOSIS — M25662 Stiffness of left knee, not elsewhere classified: Secondary | ICD-10-CM | POA: Diagnosis not present

## 2021-11-14 DIAGNOSIS — M25562 Pain in left knee: Secondary | ICD-10-CM | POA: Diagnosis not present

## 2021-11-17 DIAGNOSIS — M25562 Pain in left knee: Secondary | ICD-10-CM | POA: Diagnosis not present

## 2021-11-17 DIAGNOSIS — R600 Localized edema: Secondary | ICD-10-CM | POA: Diagnosis not present

## 2021-11-17 DIAGNOSIS — M25662 Stiffness of left knee, not elsewhere classified: Secondary | ICD-10-CM | POA: Diagnosis not present

## 2021-11-17 DIAGNOSIS — Z96652 Presence of left artificial knee joint: Secondary | ICD-10-CM | POA: Diagnosis not present

## 2021-11-17 DIAGNOSIS — Z4789 Encounter for other orthopedic aftercare: Secondary | ICD-10-CM | POA: Diagnosis not present

## 2021-11-17 DIAGNOSIS — R262 Difficulty in walking, not elsewhere classified: Secondary | ICD-10-CM | POA: Diagnosis not present

## 2021-11-21 DIAGNOSIS — R262 Difficulty in walking, not elsewhere classified: Secondary | ICD-10-CM | POA: Diagnosis not present

## 2021-11-21 DIAGNOSIS — R600 Localized edema: Secondary | ICD-10-CM | POA: Diagnosis not present

## 2021-11-21 DIAGNOSIS — Z96652 Presence of left artificial knee joint: Secondary | ICD-10-CM | POA: Diagnosis not present

## 2021-11-21 DIAGNOSIS — Z4789 Encounter for other orthopedic aftercare: Secondary | ICD-10-CM | POA: Diagnosis not present

## 2021-11-21 DIAGNOSIS — M25662 Stiffness of left knee, not elsewhere classified: Secondary | ICD-10-CM | POA: Diagnosis not present

## 2021-11-21 DIAGNOSIS — M25562 Pain in left knee: Secondary | ICD-10-CM | POA: Diagnosis not present

## 2021-11-24 DIAGNOSIS — M25562 Pain in left knee: Secondary | ICD-10-CM | POA: Diagnosis not present

## 2021-11-24 DIAGNOSIS — R262 Difficulty in walking, not elsewhere classified: Secondary | ICD-10-CM | POA: Diagnosis not present

## 2021-11-24 DIAGNOSIS — R600 Localized edema: Secondary | ICD-10-CM | POA: Diagnosis not present

## 2021-11-24 DIAGNOSIS — M25662 Stiffness of left knee, not elsewhere classified: Secondary | ICD-10-CM | POA: Diagnosis not present

## 2021-11-24 DIAGNOSIS — Z4789 Encounter for other orthopedic aftercare: Secondary | ICD-10-CM | POA: Diagnosis not present

## 2021-11-24 DIAGNOSIS — Z96652 Presence of left artificial knee joint: Secondary | ICD-10-CM | POA: Diagnosis not present

## 2021-11-28 DIAGNOSIS — R262 Difficulty in walking, not elsewhere classified: Secondary | ICD-10-CM | POA: Diagnosis not present

## 2021-11-28 DIAGNOSIS — M25562 Pain in left knee: Secondary | ICD-10-CM | POA: Diagnosis not present

## 2021-11-28 DIAGNOSIS — M25662 Stiffness of left knee, not elsewhere classified: Secondary | ICD-10-CM | POA: Diagnosis not present

## 2021-11-28 DIAGNOSIS — R600 Localized edema: Secondary | ICD-10-CM | POA: Diagnosis not present

## 2021-11-28 DIAGNOSIS — Z96652 Presence of left artificial knee joint: Secondary | ICD-10-CM | POA: Diagnosis not present

## 2021-11-28 DIAGNOSIS — Z4789 Encounter for other orthopedic aftercare: Secondary | ICD-10-CM | POA: Diagnosis not present

## 2021-12-01 DIAGNOSIS — M25562 Pain in left knee: Secondary | ICD-10-CM | POA: Diagnosis not present

## 2021-12-01 DIAGNOSIS — R262 Difficulty in walking, not elsewhere classified: Secondary | ICD-10-CM | POA: Diagnosis not present

## 2021-12-01 DIAGNOSIS — M25662 Stiffness of left knee, not elsewhere classified: Secondary | ICD-10-CM | POA: Diagnosis not present

## 2021-12-01 DIAGNOSIS — Z4789 Encounter for other orthopedic aftercare: Secondary | ICD-10-CM | POA: Diagnosis not present

## 2021-12-01 DIAGNOSIS — Z96652 Presence of left artificial knee joint: Secondary | ICD-10-CM | POA: Diagnosis not present

## 2021-12-01 DIAGNOSIS — R600 Localized edema: Secondary | ICD-10-CM | POA: Diagnosis not present

## 2021-12-05 DIAGNOSIS — Z96652 Presence of left artificial knee joint: Secondary | ICD-10-CM | POA: Diagnosis not present

## 2021-12-05 DIAGNOSIS — M25562 Pain in left knee: Secondary | ICD-10-CM | POA: Diagnosis not present

## 2021-12-05 DIAGNOSIS — M25662 Stiffness of left knee, not elsewhere classified: Secondary | ICD-10-CM | POA: Diagnosis not present

## 2021-12-05 DIAGNOSIS — R262 Difficulty in walking, not elsewhere classified: Secondary | ICD-10-CM | POA: Diagnosis not present

## 2021-12-05 DIAGNOSIS — Z4789 Encounter for other orthopedic aftercare: Secondary | ICD-10-CM | POA: Diagnosis not present

## 2021-12-05 DIAGNOSIS — R600 Localized edema: Secondary | ICD-10-CM | POA: Diagnosis not present

## 2021-12-06 ENCOUNTER — Ambulatory Visit: Payer: Medicare Other | Admitting: Internal Medicine

## 2021-12-06 DIAGNOSIS — Z471 Aftercare following joint replacement surgery: Secondary | ICD-10-CM | POA: Diagnosis not present

## 2021-12-08 ENCOUNTER — Telehealth: Payer: Self-pay | Admitting: Internal Medicine

## 2021-12-08 ENCOUNTER — Ambulatory Visit: Payer: Medicare Other | Admitting: Internal Medicine

## 2021-12-08 MED ORDER — LISINOPRIL-HYDROCHLOROTHIAZIDE 20-12.5 MG PO TABS
1.0000 | ORAL_TABLET | Freq: Every day | ORAL | 1 refills | Status: DC
Start: 2021-12-08 — End: 2022-03-20

## 2021-12-08 NOTE — Telephone Encounter (Signed)
Medication:  ?lisinopril-hydrochlorothiazide (ZESTORETIC) 20-12.5 MG tablet [[id number]]  ?  ? ?Has the patient contacted their pharmacy? No. ?(If no, request that the patient contact the pharmacy for the refill.) ?(If yes, when and what did the pharmacy advise?) ? ?  ? ?Preferred Pharmacy (with phone number or street name):  ?[id number] [place], [place] - [address]  ?[address], [place] [address] ?Phone:  [phone] Fax:  [phone]?  ? ?Agent: Please be advised that RX refills may take up to 3 business days. We ask that you follow-up with your pharmacy.  ?

## 2021-12-08 NOTE — Telephone Encounter (Signed)
Rx sent 

## 2021-12-12 ENCOUNTER — Ambulatory Visit (INDEPENDENT_AMBULATORY_CARE_PROVIDER_SITE_OTHER): Payer: Medicare Other | Admitting: Internal Medicine

## 2021-12-12 ENCOUNTER — Encounter: Payer: Self-pay | Admitting: Internal Medicine

## 2021-12-12 VITALS — BP 132/78 | HR 70 | Temp 98.7°F | Resp 18 | Ht 65.0 in | Wt 170.1 lb

## 2021-12-12 DIAGNOSIS — G4700 Insomnia, unspecified: Secondary | ICD-10-CM | POA: Diagnosis not present

## 2021-12-12 DIAGNOSIS — F3289 Other specified depressive episodes: Secondary | ICD-10-CM | POA: Diagnosis not present

## 2021-12-12 DIAGNOSIS — E7800 Pure hypercholesterolemia, unspecified: Secondary | ICD-10-CM

## 2021-12-12 DIAGNOSIS — D649 Anemia, unspecified: Secondary | ICD-10-CM

## 2021-12-12 DIAGNOSIS — R7989 Other specified abnormal findings of blood chemistry: Secondary | ICD-10-CM

## 2021-12-12 DIAGNOSIS — E0590 Thyrotoxicosis, unspecified without thyrotoxic crisis or storm: Secondary | ICD-10-CM

## 2021-12-12 DIAGNOSIS — F411 Generalized anxiety disorder: Secondary | ICD-10-CM | POA: Diagnosis not present

## 2021-12-12 MED ORDER — DULOXETINE HCL 30 MG PO CPEP: 30.0000 mg | ORAL_CAPSULE | Freq: Every day | ORAL | 3 refills | Status: DC

## 2021-12-12 NOTE — Assessment & Plan Note (Signed)
-  Td 2013, tdap recommended  ?-PNM 23:  3th injection was 2019 ?-PNM 13: 06-2019. ?-Zostavax 2008 ?-s/p Shingrix x 2  ?-Covid vax utd ?-MMG: not interested in further breast cancer screening ?-DEXA (-) 2017, WNL 11-2020 ?-CCS: last colonoscopy 2015, wnl, elected not further rescreening ?-ACP has documents on file  ?

## 2021-12-12 NOTE — Progress Notes (Signed)
? ?Subjective:  ? ? Patient ID: [name], female    DOB: [birth year], 81 y.o.   MRN: [id number] ? ?DOS:  12/12/2021 ?Type of visit - description: rov ? ?Today with talked about her chronic medical issues. ? ?Also, had a knee replacement few weeks ago. ?The surgery has been very hard on her, still hurting.  Doing rehab. ? ?Because of chronic pain, she is feeling sad, down, depressed. ?Denies any suicidal ideas. ? ?Review of Systems ?See above  ? ?Past Medical History:  ?Diagnosis Date  ? Adenomatous colon polyp 03/2003  ? Anxiety   ? ativan for sleep  ? Baker's cyst   ? Degenerative lumbar disc   ? Dr. Nelva Bush, epidural injections  ? Goiter   ? History of elevated homocysteine   ? Hypertension 09/11/2005  ? Lumbar radiculopathy   ? Dr. Nelva Bush, epidureal injections  ? Lumbosacral radiculopathy   ? seeing Pain Management  ? OA (osteoarthritis)   ?  knee pain after TKR left , back pain- last steroid injection back 2 months ago Dr Nelva Bush  ? PONV (postoperative nausea and vomiting)   ? Postlaminectomy syndrome, lumbar region   ? Dr. Nelva Bush, recommended doing bilateral S1 transforaminal epidural with D5W  ? Thyroid nodule   ? rt benign colloid nodule--left hyperplastic nodule- Thyroid bx 2006: neg  ? ? ?Past Surgical History:  ?Procedure Laterality Date  ? ABDOMINAL HYSTERECTOMY    ? BACK SURGERY  11-09-2011  ? at [facility], lumbar  ? bakers cystectomy    ? right knee  ? FACELIFT    ? INCISIONAL HERNIA REPAIR    ? KNEE ARTHROSCOPY    ? L   replacement 2007, Dr Corinne Ports, then a manipulation, then redo TKR 11/09 w/ Dr.Alusio. Parcial replacement again 09-2010  ? KNEE SURGERY    ? x 5-  left  ? OOPHORECTOMY    ? SHOULDER SURGERY    ? replacements: L 2010, R 2012  ? TOTAL HIP ARTHROPLASTY  3/11  ? Right   ? TOTAL KNEE ARTHROPLASTY  05/20/2012  ? Procedure: TOTAL KNEE ARTHROPLASTY;  Surgeon: Gearlean Alf, MD;  Location: [facility];  Service: Orthopedics;  Laterality: Right;  ? TOTAL KNEE REVISION Left 10/26/2021  ? Procedure: Left knee femoral vs  total knee arthroplasty revision;  Surgeon: Gaynelle Arabian, MD;  Location: [facility];  Service: Orthopedics;  Laterality: Left;  ? ? ?Current Outpatient Medications  ?Medication Instructions  ? azelastine (ASTELIN) 0.1 % nasal spray 2 sprays, Each Nare, At bedtime PRN  ? Calcium Carbonate-Vitamin D (CALCIUM + D PO) 1 tablet, Oral, Daily  ? ezetimibe (ZETIA) 10 mg, Oral, Daily  ? gabapentin (NEURONTIN) 800 MG tablet TAKE 1 TABLET(800 MG) BY MOUTH FOUR TIMES DAILY  ? HYDROcodone-acetaminophen (NORCO) 10-325 MG tablet 1 tablet, Oral, 3 times daily PRN  ? Lidocaine 4 % PTCH 1 patch, Apply externally, Daily PRN  ? lisinopril-hydrochlorothiazide (ZESTORETIC) 20-12.5 MG tablet 1 tablet, Oral, Daily  ? MELATONIN PO 1 capsule, Oral, At bedtime PRN  ? methocarbamol (ROBAXIN) 500 mg, Oral, Every 6 hours PRN  ? ondansetron (ZOFRAN) 4 mg, Oral, Every 6 hours PRN  ? oxyCODONE (OXY IR/ROXICODONE) 5-10 mg, Oral, Every 6 hours PRN  ? pravastatin (PRAVACHOL) 40 MG tablet TAKE 1 TABLET BY MOUTH EVERYDAY AT BEDTIME  ? traMADol (ULTRAM) 50-100 mg, Oral, Every 6 hours PRN  ? zolpidem (AMBIEN) 5 MG tablet TAKE 1 TABLET BY MOUTH AT BEDTIME AS NEEDED FOR SLEEP  ? ? ?   ?  Objective:  ? Physical Exam ?BP 132/78 (BP Location: Left Arm, Patient Position: Sitting, Cuff Size: Small)   Pulse 70   Temp 98.7 ?F (37.1 ?C) (Oral)   Resp 18   Ht '5\' 5"'$  (1.651 m)   Wt 170 lb 2 oz (77.2 kg)   SpO2 96%   BMI 28.31 kg/m?  ?General:   ?Well developed, NAD, BMI noted. ?HEENT:  ?Normocephalic . Face symmetric, atraumatic ?Lungs:  ?CTA B ?Normal respiratory effort, no intercostal retractions, no accessory muscle use. ?Heart: RRR,  no murmur.  ?Lower extremities: no pretibial edema bilaterally  ?Skin: Not pale. Not jaundice ?Neurologic:  ?alert & oriented X3.  ?Speech normal, gait appropriate for age and assisted by a cane ?Psych--  ?Cognition and judgment appear intact.  ?Cooperative with normal attention span and concentration.  ?Behavior appropriate. ?No  anxious or depressed appearing.  ? ?   ?Assessment   ? ?ASSESSMENT ?HTN ?Hyperlipidemia: Atorvastatin >> diarrhea, Rx pravastatin ?Anxiety, insomnia, ativan rx per pcp ?Elevated homocysteine ?Goiter  ?-Thyroid nodule BX 2006 neg, [place] 2013 stable,US 02-2016 -- bx 03-2016 benign ?-DX Graves' disease 2021 ?MSK: sees pain mgmt ?---DJD . ?---Postlaminectomy syndrome, back surgery 2013  ?---Intolerant to Lyrica, gabapentin helps ?--hydrocodone rx elsewhere ?-- Neuropathy: as off 11-2017 has seen 4 different specialists ?--chronic pain syndrome ?  ?PLAN ?HTN:On Zestoretic, seems well controlled, BMP after surgery satisfactory. ?Hyperlipidemia, last LDL 108, on Pravachol 40 mg, Zetia was added, check labs. ?Anxiety, insomnia: Now also with depression triggered by chronic pain. ?She had a knee replacement and is not bouncing back as quickly as she would like to.  Listening therapy provided, options include counseling and medication.  Would like to try medicine.  I elected Cymbalta 30 mg daily, no interactions and hopefully could help her with her chronic pain (she is also on gabapentin).  Reassess in 3 months, we could increase the dose before next appointment.  See AVS  ?TSH: was low at some point, re check ?DJD: Status post knee replacement few weeks ago. ?Preventive care reviewed ?RTC 3 months ? ?Time spent 32 minutes, reviewing her chart, listening therapy provided regarding  depression. ? ? ?This visit occurred during the SARS-CoV-2 public health emergency.  Safety protocols were in place, including screening questions prior to the visit, additional usage of staff PPE, and extensive cleaning of exam room while observing appropriate contact time as indicated for disinfecting solutions.  ? ?

## 2021-12-12 NOTE — Patient Instructions (Addendum)
Start Cymbalta 30 mg 1 tablet daily, it will take several days to "kick in". ? ?In about 3 to 4 weeks, call if you feel like you could increase the dose. ? ? ?Consider get a Tdap booster at the pharmacy (tetanus shot) ? ?Check the  blood pressure regularly ?BP GOAL is between 110/65 and  135/85. ?If it is consistently higher or lower, let me know ? ? ?GO TO THE LAB : Get the blood work   ? ? ?[place], [place] ?Come back for   a checkup in 3 months ?

## 2021-12-12 NOTE — Assessment & Plan Note (Addendum)
HTN:On Zestoretic, seems well controlled, BMP after surgery satisfactory. ?Hyperlipidemia, last LDL 108, on Pravachol 40 mg, Zetia was added, check labs. ?Anxiety, insomnia: Now also with depression triggered by chronic pain. ?She had a knee replacement and is not bouncing back as quickly as she would like to.  Listening therapy provided, options include counseling and medication.  Would like to try medicine.  I elected Cymbalta 30 mg daily, no interactions and hopefully could help her with her chronic pain (she is also on gabapentin).  Reassess in 3 months, we could increase the dose before next appointment.  See AVS  ?TSH: was low at some point, re check ?DJD: Status post knee replacement few weeks ago. ?Preventive care reviewed ?RTC 3 months ?

## 2021-12-13 LAB — CBC WITH DIFFERENTIAL/PLATELET
Basophils Absolute: 0.1 10*3/uL (ref 0.0–0.1)
Basophils Relative: 0.9 % (ref 0.0–3.0)
Eosinophils Absolute: 0.1 10*3/uL (ref 0.0–0.7)
Eosinophils Relative: 0.9 % (ref 0.0–5.0)
HCT: 39.3 % (ref 36.0–46.0)
Hemoglobin: 13 g/dL (ref 12.0–15.0)
Lymphocytes Relative: 19.2 % (ref 12.0–46.0)
Lymphs Abs: 1.7 10*3/uL (ref 0.7–4.0)
MCHC: 33.1 g/dL (ref 30.0–36.0)
MCV: 92.8 fl (ref 78.0–100.0)
Monocytes Absolute: 0.8 10*3/uL (ref 0.1–1.0)
Monocytes Relative: 8.5 % (ref 3.0–12.0)
Neutro Abs: 6.3 10*3/uL (ref 1.4–7.7)
Neutrophils Relative %: 70.5 % (ref 43.0–77.0)
Platelets: 337 10*3/uL (ref 150.0–400.0)
RBC: 4.23 Mil/uL (ref 3.87–5.11)
RDW: 14.1 % (ref 11.5–15.5)
WBC: 8.9 10*3/uL (ref 4.0–10.5)

## 2021-12-13 LAB — LIPID PANEL
Cholesterol: 217 mg/dL — ABNORMAL HIGH (ref 0–200)
HDL: 116.6 mg/dL (ref >39.00)
LDL Cholesterol: 78 mg/dL (ref 0–99)
NonHDL: 100.76
Total CHOL/HDL Ratio: 2
Triglycerides: 113 mg/dL (ref 0.0–149.0)
VLDL: 22.6 mg/dL (ref 0.0–40.0)

## 2021-12-13 LAB — TSH: TSH: 0.62 u[IU]/mL (ref 0.35–5.50)

## 2021-12-14 ENCOUNTER — Telehealth: Payer: Self-pay | Admitting: Internal Medicine

## 2021-12-14 DIAGNOSIS — F0390 Unspecified dementia without behavioral disturbance: Secondary | ICD-10-CM

## 2021-12-14 NOTE — Telephone Encounter (Signed)
LMOM to [facility] asked for a call back ?

## 2021-12-14 NOTE — Telephone Encounter (Signed)
Zaila Crew stated he is POA of his mother (no documentation scanned and no updated dpr). He stated he needs to talk with Dr.Paz immediately about getting a dementia test for pt. He also stated she is on an antidepressant she does not need to be on. He did not know the name of rx. Please advise.  ?

## 2021-12-14 NOTE — Telephone Encounter (Signed)
Healthcare Power of attorney paperwork is on file.  ? ?

## 2021-12-14 NOTE — Telephone Encounter (Signed)
Unable to discuss [name]'s medical status with him without proper documentation ?

## 2021-12-15 NOTE — Telephone Encounter (Signed)
Please arrange a neurology referral, DX dementia ?===== ?I accept the phone call from the patient's son, he is the POA. ?The patient's son states: ?"she is not depressed and she is not taking an antidepressant". ?"She is dramatic not depressed". ?"Her memory is terrible and getting much worse". ?I recommend a more detailed neurological exam, we agreed on a neurology referral for possible dementia ? ?

## 2021-12-15 NOTE — Addendum Note (Signed)
Addended byDamita Dunnings D on: 12/15/2021 02:00 PM ? ? Modules accepted: Orders ? ?

## 2021-12-15 NOTE — Telephone Encounter (Signed)
Referral placed.

## 2021-12-21 DIAGNOSIS — M25662 Stiffness of left knee, not elsewhere classified: Secondary | ICD-10-CM | POA: Diagnosis not present

## 2021-12-21 DIAGNOSIS — R262 Difficulty in walking, not elsewhere classified: Secondary | ICD-10-CM | POA: Diagnosis not present

## 2021-12-21 DIAGNOSIS — Z4789 Encounter for other orthopedic aftercare: Secondary | ICD-10-CM | POA: Diagnosis not present

## 2021-12-21 DIAGNOSIS — M25562 Pain in left knee: Secondary | ICD-10-CM | POA: Diagnosis not present

## 2021-12-21 DIAGNOSIS — Z96652 Presence of left artificial knee joint: Secondary | ICD-10-CM | POA: Diagnosis not present

## 2021-12-21 DIAGNOSIS — R600 Localized edema: Secondary | ICD-10-CM | POA: Diagnosis not present

## 2021-12-29 DIAGNOSIS — G4700 Insomnia, unspecified: Secondary | ICD-10-CM | POA: Diagnosis not present

## 2021-12-29 DIAGNOSIS — Z9181 History of falling: Secondary | ICD-10-CM | POA: Diagnosis not present

## 2021-12-29 DIAGNOSIS — M961 Postlaminectomy syndrome, not elsewhere classified: Secondary | ICD-10-CM | POA: Diagnosis not present

## 2021-12-29 DIAGNOSIS — T84033D Mechanical loosening of internal left knee prosthetic joint, subsequent encounter: Secondary | ICD-10-CM | POA: Diagnosis not present

## 2021-12-29 DIAGNOSIS — M5116 Intervertebral disc disorders with radiculopathy, lumbar region: Secondary | ICD-10-CM | POA: Diagnosis not present

## 2021-12-29 DIAGNOSIS — G629 Polyneuropathy, unspecified: Secondary | ICD-10-CM | POA: Diagnosis not present

## 2021-12-29 DIAGNOSIS — I10 Essential (primary) hypertension: Secondary | ICD-10-CM | POA: Diagnosis not present

## 2021-12-29 DIAGNOSIS — G894 Chronic pain syndrome: Secondary | ICD-10-CM | POA: Diagnosis not present

## 2021-12-29 DIAGNOSIS — E0510 Thyrotoxicosis with toxic single thyroid nodule without thyrotoxic crisis or storm: Secondary | ICD-10-CM | POA: Diagnosis not present

## 2021-12-29 DIAGNOSIS — F411 Generalized anxiety disorder: Secondary | ICD-10-CM | POA: Diagnosis not present

## 2021-12-29 DIAGNOSIS — Z981 Arthrodesis status: Secondary | ICD-10-CM | POA: Diagnosis not present

## 2021-12-29 DIAGNOSIS — Z87891 Personal history of nicotine dependence: Secondary | ICD-10-CM | POA: Diagnosis not present

## 2021-12-29 DIAGNOSIS — Z96651 Presence of right artificial knee joint: Secondary | ICD-10-CM | POA: Diagnosis not present

## 2021-12-29 DIAGNOSIS — E7800 Pure hypercholesterolemia, unspecified: Secondary | ICD-10-CM | POA: Diagnosis not present

## 2021-12-29 DIAGNOSIS — S72432D Displaced fracture of medial condyle of left femur, subsequent encounter for closed fracture with routine healing: Secondary | ICD-10-CM | POA: Diagnosis not present

## 2021-12-29 DIAGNOSIS — M9712XD Periprosthetic fracture around internal prosthetic left knee joint, subsequent encounter: Secondary | ICD-10-CM | POA: Diagnosis not present

## 2021-12-29 DIAGNOSIS — M4726 Other spondylosis with radiculopathy, lumbar region: Secondary | ICD-10-CM | POA: Diagnosis not present

## 2021-12-29 DIAGNOSIS — M48061 Spinal stenosis, lumbar region without neurogenic claudication: Secondary | ICD-10-CM | POA: Diagnosis not present

## 2022-01-03 DIAGNOSIS — M9712XD Periprosthetic fracture around internal prosthetic left knee joint, subsequent encounter: Secondary | ICD-10-CM | POA: Diagnosis not present

## 2022-01-03 DIAGNOSIS — I10 Essential (primary) hypertension: Secondary | ICD-10-CM | POA: Diagnosis not present

## 2022-01-03 DIAGNOSIS — M48061 Spinal stenosis, lumbar region without neurogenic claudication: Secondary | ICD-10-CM | POA: Diagnosis not present

## 2022-01-03 DIAGNOSIS — S72432D Displaced fracture of medial condyle of left femur, subsequent encounter for closed fracture with routine healing: Secondary | ICD-10-CM | POA: Diagnosis not present

## 2022-01-03 DIAGNOSIS — M5116 Intervertebral disc disorders with radiculopathy, lumbar region: Secondary | ICD-10-CM | POA: Diagnosis not present

## 2022-01-03 DIAGNOSIS — T84033D Mechanical loosening of internal left knee prosthetic joint, subsequent encounter: Secondary | ICD-10-CM | POA: Diagnosis not present

## 2022-01-04 ENCOUNTER — Other Ambulatory Visit: Payer: Self-pay | Admitting: Internal Medicine

## 2022-01-04 DIAGNOSIS — M9712XD Periprosthetic fracture around internal prosthetic left knee joint, subsequent encounter: Secondary | ICD-10-CM | POA: Diagnosis not present

## 2022-01-04 DIAGNOSIS — I10 Essential (primary) hypertension: Secondary | ICD-10-CM | POA: Diagnosis not present

## 2022-01-04 DIAGNOSIS — M5116 Intervertebral disc disorders with radiculopathy, lumbar region: Secondary | ICD-10-CM | POA: Diagnosis not present

## 2022-01-04 DIAGNOSIS — S72432D Displaced fracture of medial condyle of left femur, subsequent encounter for closed fracture with routine healing: Secondary | ICD-10-CM | POA: Diagnosis not present

## 2022-01-04 DIAGNOSIS — T84033D Mechanical loosening of internal left knee prosthetic joint, subsequent encounter: Secondary | ICD-10-CM | POA: Diagnosis not present

## 2022-01-04 DIAGNOSIS — M48061 Spinal stenosis, lumbar region without neurogenic claudication: Secondary | ICD-10-CM | POA: Diagnosis not present

## 2022-01-09 DIAGNOSIS — M48061 Spinal stenosis, lumbar region without neurogenic claudication: Secondary | ICD-10-CM | POA: Diagnosis not present

## 2022-01-09 DIAGNOSIS — S72432D Displaced fracture of medial condyle of left femur, subsequent encounter for closed fracture with routine healing: Secondary | ICD-10-CM | POA: Diagnosis not present

## 2022-01-09 DIAGNOSIS — M5116 Intervertebral disc disorders with radiculopathy, lumbar region: Secondary | ICD-10-CM | POA: Diagnosis not present

## 2022-01-09 DIAGNOSIS — T84033D Mechanical loosening of internal left knee prosthetic joint, subsequent encounter: Secondary | ICD-10-CM | POA: Diagnosis not present

## 2022-01-09 DIAGNOSIS — M9712XD Periprosthetic fracture around internal prosthetic left knee joint, subsequent encounter: Secondary | ICD-10-CM | POA: Diagnosis not present

## 2022-01-09 DIAGNOSIS — I10 Essential (primary) hypertension: Secondary | ICD-10-CM | POA: Diagnosis not present

## 2022-01-11 DIAGNOSIS — S72432D Displaced fracture of medial condyle of left femur, subsequent encounter for closed fracture with routine healing: Secondary | ICD-10-CM | POA: Diagnosis not present

## 2022-01-11 DIAGNOSIS — M5116 Intervertebral disc disorders with radiculopathy, lumbar region: Secondary | ICD-10-CM | POA: Diagnosis not present

## 2022-01-11 DIAGNOSIS — M48061 Spinal stenosis, lumbar region without neurogenic claudication: Secondary | ICD-10-CM | POA: Diagnosis not present

## 2022-01-11 DIAGNOSIS — I10 Essential (primary) hypertension: Secondary | ICD-10-CM | POA: Diagnosis not present

## 2022-01-11 DIAGNOSIS — M9712XD Periprosthetic fracture around internal prosthetic left knee joint, subsequent encounter: Secondary | ICD-10-CM | POA: Diagnosis not present

## 2022-01-11 DIAGNOSIS — T84033D Mechanical loosening of internal left knee prosthetic joint, subsequent encounter: Secondary | ICD-10-CM | POA: Diagnosis not present

## 2022-01-16 DIAGNOSIS — S72432D Displaced fracture of medial condyle of left femur, subsequent encounter for closed fracture with routine healing: Secondary | ICD-10-CM | POA: Diagnosis not present

## 2022-01-16 DIAGNOSIS — M5116 Intervertebral disc disorders with radiculopathy, lumbar region: Secondary | ICD-10-CM | POA: Diagnosis not present

## 2022-01-16 DIAGNOSIS — I10 Essential (primary) hypertension: Secondary | ICD-10-CM | POA: Diagnosis not present

## 2022-01-16 DIAGNOSIS — M9712XD Periprosthetic fracture around internal prosthetic left knee joint, subsequent encounter: Secondary | ICD-10-CM | POA: Diagnosis not present

## 2022-01-16 DIAGNOSIS — M48061 Spinal stenosis, lumbar region without neurogenic claudication: Secondary | ICD-10-CM | POA: Diagnosis not present

## 2022-01-16 DIAGNOSIS — T84033D Mechanical loosening of internal left knee prosthetic joint, subsequent encounter: Secondary | ICD-10-CM | POA: Diagnosis not present

## 2022-01-17 ENCOUNTER — Encounter: Payer: Self-pay | Admitting: Neurology

## 2022-01-17 ENCOUNTER — Telehealth: Payer: Self-pay | Admitting: Neurology

## 2022-01-17 ENCOUNTER — Ambulatory Visit (INDEPENDENT_AMBULATORY_CARE_PROVIDER_SITE_OTHER): Payer: Medicare Other | Admitting: Neurology

## 2022-01-17 VITALS — BP 163/100 | HR 61 | Ht 65.0 in | Wt 170.0 lb

## 2022-01-17 DIAGNOSIS — G3184 Mild cognitive impairment, so stated: Secondary | ICD-10-CM | POA: Diagnosis not present

## 2022-01-17 MED ORDER — DONEPEZIL HCL 10 MG PO TABS: 10.0000 mg | ORAL_TABLET | Freq: Every day | ORAL | 11 refills | Status: DC

## 2022-01-17 NOTE — Progress Notes (Signed)
? ?[facility] ? ?PATIENT: [name] ?DOB: 16-Nov-1940 ? ?REQUESTING CLINICIAN: Colon Branch, MD ?HISTORY FROM: Patient and son Gerald Stabs  ?REASON FOR VISIT: Memory decline  ? ? ?HISTORICAL ? ?CHIEF COMPLAINT:  ?Chief Complaint  ?Patient presents with  ? New Patient (Initial Visit)  ?  Rm 14, with son ?NP Internal referral for dementia ?Pt states her memory is fine but son is concerned of the sudden decline  ?[place] [address]  ? ? ?HISTORY OF PRESENT ILLNESS:  ?This is a 81 year old woman with past medical history of hypertension, peripheral neuropathy who is presenting for memory decline.  Patient currently lives at the independent living facility, she reported being forgetful but she is still independent and able to do all her ADLs and her IADLs.  Per son, patient is more forgetful about recent conversation, she repeat herself over and over and at times she has been also forgetting her doctor appointment.   ?She currently drives, denies being lost in family or places, denies any recent accident.  Again she given an independent living facility, they provide her meal and cleaning her apartment.  She lives by herself in an apartment she does her own laundry, does not need any additional help and able to manage her bills.  Patient reports moving in that facility after her husband died.  At that time she was having some depressive symptoms and her doctor put her on duloxetine that she took for couple months and then discontinued.  Currently she denies any depressive symptoms said that she feels fine.   ? ? ?TBI:   No past history of TBI ?Stroke:   no past history of stroke ?Seizures:   no past history of seizures ?Sleep:   no history of sleep apnea. ?Mood: Yes patient reports anxiety and depression after death of husband and dog within the same month ? ?Functional status: independent in all ADLs and IADLs ?Patient lives in an independent living facility . ?Cooking: No ?Cleaning: No ?Shopping: Yes  ?Bathing:  Yes  ?Toileting: Yes  ?Driving: Yes  ?Bills: Yes  ? ?Ever left the stove on by accident?: Not cooking  ?Forget how to use items around the house?: No  ?Getting lost going to familiar places?: No  ?Forgetting loved ones names?: No ?Word finding difficulty? No ?Sleep: Good per patient  ? ? ?OTHER MEDICAL CONDITIONS: Hypertension, Peripheral neuropathy, multiple joint replacements ? ? ?REVIEW OF SYSTEMS: Full 14 system review of systems performed and negative with exception of: as noted in the HPI  ? ?ALLERGIES: ?No Known Allergies ? ?HOME MEDICATIONS: ?Outpatient Medications Prior to Visit  ?Medication Sig Dispense Refill  ? azelastine (ASTELIN) 0.1 % nasal spray Place 2 sprays into both nostrils at bedtime as needed for rhinitis or allergies. 30 mL 12  ? Calcium Carbonate-Vitamin D (CALCIUM + D PO) Take 1 tablet by mouth daily.    ? DULoxetine (CYMBALTA) 30 MG capsule TAKE 1 CAPSULE BY MOUTH EVERY DAY 90 capsule 0  ? ezetimibe (ZETIA) 10 MG tablet Take 1 tablet (10 mg total) by mouth daily. 90 tablet 3  ? gabapentin (NEURONTIN) 800 MG tablet TAKE 1 TABLET(800 MG) BY MOUTH FOUR TIMES DAILY 120 tablet 0  ? HYDROcodone-acetaminophen (NORCO) 10-325 MG tablet Take 1 tablet by mouth 3 (three) times daily as needed.    ? Lidocaine 4 % PTCH Apply 1 patch topically daily as needed (pain).    ? lisinopril-hydrochlorothiazide (ZESTORETIC) 20-12.5 MG tablet Take 1 tablet by mouth daily. 90 tablet  1  ? MELATONIN PO Take 1 capsule by mouth at bedtime as needed (sleep).    ? zolpidem (AMBIEN) 5 MG tablet TAKE 1 TABLET BY MOUTH AT BEDTIME AS NEEDED FOR SLEEP 30 tablet 5  ? methocarbamol (ROBAXIN) 500 MG tablet Take 1 tablet (500 mg total) by mouth every 6 (six) hours as needed for muscle spasms. (Patient not taking: Reported on 12/12/2021) 40 tablet 0  ? ondansetron (ZOFRAN) 4 MG tablet Take 1 tablet (4 mg total) by mouth every 6 (six) hours as needed for nausea. (Patient not taking: Reported on 12/12/2021) 20 tablet 0  ? pravastatin  (PRAVACHOL) 40 MG tablet TAKE 1 TABLET BY MOUTH EVERYDAY AT BEDTIME 90 tablet 1  ? ?No facility-administered medications prior to visit.  ? ? ?PAST MEDICAL HISTORY: ?Past Medical History:  ?Diagnosis Date  ? Adenomatous colon polyp 03/2003  ? Anxiety   ? ativan for sleep  ? Baker's cyst   ? Degenerative lumbar disc   ? Dr. Nelva Bush, epidural injections  ? Goiter   ? History of elevated homocysteine   ? Hypertension 09/11/2005  ? Lumbar radiculopathy   ? Dr. Nelva Bush, epidureal injections  ? Lumbosacral radiculopathy   ? seeing Pain Management  ? OA (osteoarthritis)   ?  knee pain after TKR left , back pain- last steroid injection back 2 months ago Dr Nelva Bush  ? PONV (postoperative nausea and vomiting)   ? Postlaminectomy syndrome, lumbar region   ? Dr. Nelva Bush, recommended doing bilateral S1 transforaminal epidural with D5W  ? Thyroid nodule   ? rt benign colloid nodule--left hyperplastic nodule- Thyroid bx 2006: neg  ? ? ?PAST SURGICAL HISTORY: ?Past Surgical History:  ?Procedure Laterality Date  ? ABDOMINAL HYSTERECTOMY    ? BACK SURGERY  11-09-2011  ? at [facility], lumbar  ? bakers cystectomy    ? right knee  ? FACELIFT    ? INCISIONAL HERNIA REPAIR    ? KNEE ARTHROSCOPY    ? L   replacement 2007, Dr Corinne Ports, then a manipulation, then redo TKR 11/09 w/ Dr.Alusio. Parcial replacement again 09-2010  ? KNEE SURGERY    ? x 5-  left  ? OOPHORECTOMY    ? SHOULDER SURGERY    ? replacements: L 2010, R 2012  ? TOTAL HIP ARTHROPLASTY  3/11  ? Right   ? TOTAL KNEE ARTHROPLASTY  05/20/2012  ? Procedure: TOTAL KNEE ARTHROPLASTY;  Surgeon: Gearlean Alf, MD;  Location: [facility];  Service: Orthopedics;  Laterality: Right;  ? TOTAL KNEE REVISION Left 10/26/2021  ? Procedure: Left knee femoral vs total knee arthroplasty revision;  Surgeon: Gaynelle Arabian, MD;  Location: [facility];  Service: Orthopedics;  Laterality: Left;  ? ? ?FAMILY HISTORY: ?Family History  ?Problem Relation Age of Onset  ? Coronary artery disease Father   ?     CABG at 11  ? Prostate  cancer Father   ? Colon polyps Father   ? Coronary artery disease Brother   ? Colon polyps Brother   ? Breast cancer Neg Hx   ? Colon cancer Neg Hx   ? Pancreatic cancer Neg Hx   ? Stomach cancer Neg Hx   ? Thyroid disease Neg Hx   ? ? ?SOCIAL HISTORY: ?Social History  ? ?Socioeconomic History  ? Marital status: Married  ?  Spouse name: Not on file  ? Number of children: 2  ? Years of education: Not on file  ? Highest education level: Not on file  ?Occupational  History  ? Occupation: Retired Pharmacist, hospital  ?  Comment: retired  ?  Employer: RETIRED  ?Tobacco Use  ? Smoking status: Former  ?  Types: Cigarettes  ?  Quit date: 09/12/1983  ?  Years since quitting: 38.3  ? Smokeless tobacco: Never  ?Vaping Use  ? Vaping Use: Never used  ?Substance and Sexual Activity  ? Alcohol use: Yes  ?  Alcohol/week: 7.0 standard drinks  ?  Types: 7 Glasses of wine per week  ?  Comment: occasional   ? Drug use: No  ? Sexual activity: Not on file  ?Other Topics Concern  ? Not on file  ?Social History Narrative  ? Lives by herself in a one story house   ?  husband at a ALF/NHf 10 years in a 3 story home.   ?  Retired Education officer, museum.  Education: college.  ?   ?   ? ?Social Determinants of Health  ? ?Financial Resource Strain: Not on file  ?Food Insecurity: Not on file  ?Transportation Needs: Not on file  ?Physical Activity: Not on file  ?Stress: Not on file  ?Social Connections: Not on file  ?Intimate Partner Violence: Not on file  ? ? ?PHYSICAL EXAM ? ?GENERAL EXAM/CONSTITUTIONAL: ?Vitals:  ?Vitals:  ? 01/17/22 1051  ?BP: (!) 163/100  ?Pulse: 61  ?Weight: 170 lb (77.1 kg)  ?Height: '5\' 5"'$  (1.651 m)  ? ?Body mass index is 28.29 kg/m?. ?Wt Readings from Last 3 Encounters:  ?01/17/22 170 lb (77.1 kg)  ?12/12/21 170 lb 2 oz (77.2 kg)  ?10/26/21 168 lb (76.2 kg)  ? ?Patient is in no distress; well developed, nourished and groomed; neck is supple ? ?CARDIOVASCULAR: ?Examination of carotid arteries is normal; no carotid bruits ?Regular rate and rhythm,  no murmurs ?Examination of peripheral vascular system by observation and palpation is normal ? ?EYES: ?Pupils round and reactive to light, Visual fields full to confrontation, Extraocular movements intact

## 2022-01-17 NOTE — Telephone Encounter (Signed)
Referral sent to [facility] [phone]. ?

## 2022-01-17 NOTE — Patient Instructions (Signed)
Start with Aricept 10 mg (1/2 tablet) nightly for 4 weeks then increase to full tablet ?Continue your other medications  ?Referral to neuropsychology  ?Check Vitamin B 12 today  ?Return in 1 year  ? ? ?There are well-accepted and sensible ways to reduce risk for Alzheimers disease and other degenerative brain disorders . ? ?Exercise Daily Walk A daily 20 minute walk should be part of your routine. Disease related apathy can be a significant roadblock to exercise and the only way to overcome this is to make it a daily routine and perhaps have a reward at the end (something your loved one loves to eat or drink perhaps) or a personal trainer coming to the home can also be very useful. Most importantly, the patient is much more likely to exercise if the caregiver / spouse does it with him/her. In general a structured, repetitive schedule is best. ? ?General Health: Any diseases which effect your body will effect your brain such as a pneumonia, urinary infection, blood clot, heart attack or stroke. Keep contact with your primary care doctor for regular follow ups. ? ?Sleep. A good nights sleep is healthy for the brain. Seven hours is recommended. If you have insomnia or poor sleep habits we can give you some instructions. If you have sleep apnea wear your mask. ? ?Diet: Eating a heart healthy diet is also a good idea; fish and poultry instead of red meat, nuts (mostly non-peanuts), vegetables, fruits, olive oil or canola oil (instead of butter), minimal salt (use other spices to flavor foods), whole grain rice, bread, cereal and pasta and wine in moderation.Research is now showing that the MIND diet, which is a combination of The Mediterranean diet and the DASH diet, is beneficial for cognitive processing and longevity. Information about this diet can be found in The MIND Diet, a book by Doyne Keel, MS, RDN, and online at [employer]bookDist[employer].si ? ?Finances, Power of Producer, television/film/video  Directives: You should consider putting legal safeguards in place with regard to financial and medical decision making. While the spouse always has power of attorney for medical and financial issues in the absence of any form, you should consider what you want in case the spouse / caregiver is no longer around or capable of making decisions.  ? ? ? ?  ?

## 2022-01-18 DIAGNOSIS — T84033D Mechanical loosening of internal left knee prosthetic joint, subsequent encounter: Secondary | ICD-10-CM | POA: Diagnosis not present

## 2022-01-18 DIAGNOSIS — M5116 Intervertebral disc disorders with radiculopathy, lumbar region: Secondary | ICD-10-CM | POA: Diagnosis not present

## 2022-01-18 DIAGNOSIS — S72432D Displaced fracture of medial condyle of left femur, subsequent encounter for closed fracture with routine healing: Secondary | ICD-10-CM | POA: Diagnosis not present

## 2022-01-18 DIAGNOSIS — M9712XD Periprosthetic fracture around internal prosthetic left knee joint, subsequent encounter: Secondary | ICD-10-CM | POA: Diagnosis not present

## 2022-01-18 DIAGNOSIS — M48061 Spinal stenosis, lumbar region without neurogenic claudication: Secondary | ICD-10-CM | POA: Diagnosis not present

## 2022-01-18 DIAGNOSIS — I10 Essential (primary) hypertension: Secondary | ICD-10-CM | POA: Diagnosis not present

## 2022-01-18 LAB — VITAMIN B12: Vitamin B-12: 406 pg/mL (ref 232–1245)

## 2022-01-20 ENCOUNTER — Telehealth: Payer: Self-pay | Admitting: Neurology

## 2022-01-20 ENCOUNTER — Encounter: Payer: Self-pay | Admitting: Internal Medicine

## 2022-01-20 NOTE — Telephone Encounter (Signed)
Pt said took donepezil (ARICEPT) 10 MG tablet on 01/17/22 started having cold sweats, choking, throwing up. Because of throwing up  chest was hurting. Have not taken anymore of the medication. Would like a call from the nurse. ?

## 2022-01-23 NOTE — Telephone Encounter (Signed)
No additional medication until after visit with Neuropsych evaluation. If there is a diagnosis of Dementia, then we can discuss about starting Namenda.  ? ?Thanks  ?Strider Vallance

## 2022-01-23 NOTE — Telephone Encounter (Signed)
I was unable to reach the patient at either number listed. Unable to leave a message. I called her dgt-in-law, Turkessa Ostrom, on DPR. Left voicemail with Dr. Jabier Mutton plan below. Provided our number to call back with any questions.  ?

## 2022-01-23 NOTE — Telephone Encounter (Signed)
I spoke to the patient. She tried donepezil '10mg'$ , one-quarter tablet at bedtime (two doses). Says she had a significant reaction to the medication. Intolerable diarrhea, cold sweats, nausea, vomiting. She has stopped the medication. Says she would be willing to try an alternate medication.  ?

## 2022-01-23 NOTE — Telephone Encounter (Signed)
Left message for a return call

## 2022-01-25 DIAGNOSIS — S72432D Displaced fracture of medial condyle of left femur, subsequent encounter for closed fracture with routine healing: Secondary | ICD-10-CM | POA: Diagnosis not present

## 2022-01-25 DIAGNOSIS — M48061 Spinal stenosis, lumbar region without neurogenic claudication: Secondary | ICD-10-CM | POA: Diagnosis not present

## 2022-01-25 DIAGNOSIS — M9712XD Periprosthetic fracture around internal prosthetic left knee joint, subsequent encounter: Secondary | ICD-10-CM | POA: Diagnosis not present

## 2022-01-25 DIAGNOSIS — I10 Essential (primary) hypertension: Secondary | ICD-10-CM | POA: Diagnosis not present

## 2022-01-25 DIAGNOSIS — M5116 Intervertebral disc disorders with radiculopathy, lumbar region: Secondary | ICD-10-CM | POA: Diagnosis not present

## 2022-01-25 DIAGNOSIS — T84033D Mechanical loosening of internal left knee prosthetic joint, subsequent encounter: Secondary | ICD-10-CM | POA: Diagnosis not present

## 2022-02-07 DIAGNOSIS — Z4789 Encounter for other orthopedic aftercare: Secondary | ICD-10-CM | POA: Diagnosis not present

## 2022-02-27 ENCOUNTER — Encounter: Payer: Self-pay | Admitting: *Deleted

## 2022-03-20 ENCOUNTER — Ambulatory Visit (INDEPENDENT_AMBULATORY_CARE_PROVIDER_SITE_OTHER): Payer: Medicare Other | Admitting: Internal Medicine

## 2022-03-20 ENCOUNTER — Telehealth: Payer: Self-pay

## 2022-03-20 ENCOUNTER — Encounter: Payer: Self-pay | Admitting: Internal Medicine

## 2022-03-20 VITALS — BP 124/72 | HR 86 | Temp 98.5°F | Resp 18 | Ht 65.0 in | Wt 164.2 lb

## 2022-03-20 DIAGNOSIS — G4700 Insomnia, unspecified: Secondary | ICD-10-CM | POA: Diagnosis not present

## 2022-03-20 DIAGNOSIS — G3184 Mild cognitive impairment, so stated: Secondary | ICD-10-CM

## 2022-03-20 DIAGNOSIS — F411 Generalized anxiety disorder: Secondary | ICD-10-CM

## 2022-03-20 DIAGNOSIS — E7800 Pure hypercholesterolemia, unspecified: Secondary | ICD-10-CM

## 2022-03-20 DIAGNOSIS — I10 Essential (primary) hypertension: Secondary | ICD-10-CM | POA: Diagnosis not present

## 2022-03-20 MED ORDER — LISINOPRIL-HYDROCHLOROTHIAZIDE 20-12.5 MG PO TABS: 1.0000 | ORAL_TABLET | Freq: Every day | ORAL | 1 refills | Status: DC

## 2022-03-20 MED ORDER — TETANUS-DIPHTH-ACELL PERTUSSIS 5-2.5-18.5 LF-MCG/0.5 IM SUSP: 0.5000 mL | Freq: Once | INTRAMUSCULAR | 0 refills | Status: AC

## 2022-03-20 MED ORDER — PRAVASTATIN SODIUM 40 MG PO TABS: ORAL_TABLET | ORAL | Status: DC

## 2022-03-20 MED ORDER — ZOLPIDEM TARTRATE 5 MG PO TABS: 5.0000 mg | ORAL_TABLET | Freq: Every evening | ORAL | 5 refills | Status: DC | PRN

## 2022-03-20 MED ORDER — EZETIMIBE 10 MG PO TABS: 10.0000 mg | ORAL_TABLET | Freq: Every day | ORAL | 1 refills | Status: DC

## 2022-03-20 MED ORDER — PRAVASTATIN SODIUM 40 MG PO TABS: 40.0000 mg | ORAL_TABLET | Freq: Every day | ORAL | 1 refills | Status: DC

## 2022-03-20 NOTE — Patient Instructions (Addendum)
Recommend to proceed with Tdap (tetanus) vaccine at your pharmacy- see printed prescription  Recommend a flu shot this fall        [place], [place] back for a checkup in 4 months

## 2022-03-20 NOTE — Progress Notes (Unsigned)
Subjective:    Patient ID: [name], female    DOB: [birth year], 81 y.o.   MRN: [id number]  DOS:  03/20/2022 Type of visit - description: f/u  Since the last office visit is doing well. Saw neurology, note reviewed. Reports she is not anxious or depressed, doing great. Review of Systems See above   Past Medical History:  Diagnosis Date   Adenomatous colon polyp 03/2003   Anxiety    ativan for sleep   Baker's cyst    Degenerative lumbar disc    Dr. Nelva Bush, epidural injections   Goiter    History of elevated homocysteine    Hypertension 09/11/2005   Lumbar radiculopathy    Dr. Nelva Bush, epidureal injections   Lumbosacral radiculopathy    seeing Pain Management   OA (osteoarthritis)     knee pain after TKR left , back pain- last steroid injection back 2 months ago Dr Nelva Bush   PONV (postoperative nausea and vomiting)    Postlaminectomy syndrome, lumbar region    Dr. Nelva Bush, recommended doing bilateral S1 transforaminal epidural with D5W   Thyroid nodule    rt benign colloid nodule--left hyperplastic nodule- Thyroid bx 2006: neg    Past Surgical History:  Procedure Laterality Date   ABDOMINAL HYSTERECTOMY     BACK SURGERY  11-09-2011   at [facility], lumbar   bakers cystectomy     right knee   FACELIFT     INCISIONAL HERNIA REPAIR     KNEE ARTHROSCOPY     L   replacement 2007, Dr Corinne Ports, then a manipulation, then redo TKR 11/09 w/ Dr.Alusio. Parcial replacement again 09-2010   KNEE SURGERY     x 5-  left   OOPHORECTOMY     SHOULDER SURGERY     replacements: L 2010, R 2012   TOTAL HIP ARTHROPLASTY  3/11   Right    TOTAL KNEE ARTHROPLASTY  05/20/2012   Procedure: TOTAL KNEE ARTHROPLASTY;  Surgeon: Gearlean Alf, MD;  Location: [facility];  Service: Orthopedics;  Laterality: Right;   TOTAL KNEE REVISION Left 10/26/2021   Procedure: Left knee femoral vs total knee arthroplasty revision;  Surgeon: Gaynelle Arabian, MD;  Location: [facility];  Service: Orthopedics;  Laterality: Left;     Current Outpatient Medications  Medication Instructions   azelastine (ASTELIN) 0.1 % nasal spray 2 sprays, Each Nare, At bedtime PRN   Calcium Carbonate-Vitamin D (CALCIUM + D PO) 1 tablet, Oral, Daily   DULoxetine (CYMBALTA) 30 MG capsule TAKE 1 CAPSULE BY MOUTH EVERY DAY   ezetimibe (ZETIA) 10 mg, Oral, Daily   gabapentin (NEURONTIN) 800 MG tablet TAKE 1 TABLET(800 MG) BY MOUTH FOUR TIMES DAILY   HYDROcodone-acetaminophen (NORCO) 10-325 MG tablet 1 tablet, Oral, 3 times daily PRN   Lidocaine 4 % PTCH 1 patch, Apply externally, Daily PRN   lisinopril-hydrochlorothiazide (ZESTORETIC) 20-12.5 MG tablet 1 tablet, Oral, Daily   MELATONIN PO 1 capsule, Oral, At bedtime PRN   Tdap (BOOSTRIX) 5-2.5-18.5 LF-MCG/0.5 injection 0.5 mLs, Intramuscular,  Once   zolpidem (AMBIEN) 5 MG tablet TAKE 1 TABLET BY MOUTH AT BEDTIME AS NEEDED FOR SLEEP       Objective:   Physical Exam BP 124/72   Pulse 86   Temp 98.5 F (36.9 C) (Oral)   Resp 18   Ht '5\' 5"'$  (1.651 m)   Wt 164 lb 4 oz (74.5 kg)   SpO2 92%   BMI 27.33 kg/m  General:   Well developed, NAD, BMI noted.  HEENT:  Normocephalic . Face symmetric, atraumatic Lungs:  CTA B Normal respiratory effort, no intercostal retractions, no accessory muscle use. Heart: RRR, trace systolic  murmur.  Lower extremities: no pretibial edema bilaterally  Skin: Not pale. Not jaundice Neurologic:  alert & oriented X3.  Speech normal, gait appropriate for age and unassisted Psych--  Cognition and judgment appear intact.  Cooperative with normal attention span and concentration.  Behavior appropriate. No anxious or depressed appearing.      Assessment    ASSESSMENT HTN Hyperlipidemia: Atorvastatin >> diarrhea, Rx pravastatin Anxiety, insomnia, ativan rx per pcp Elevated homocysteine Goiter  -Thyroid nodule BX 2006 neg, [place] 2013 stable,US 02-2016 -- bx 03-2016 benign -DX Graves' disease 2021 [facility]: sees pain mgmt ---DJD . ---Postlaminectomy  syndrome, back surgery 2013  ---Intolerant to Lyrica, gabapentin helps --hydrocodone rx elsewhere -- Neuropathy: as off 11-2017 has seen 4 different specialists --chronic pain syndrome   PLAN HTN: BP is very good, no ambulatory BPs, continue Zestoretic, last BMP okay. Hyperlipidemia: On pravastatin and Zetia.  Last LDL satisfactory Anxiety insomnia depression: Since the last office visit, I had a conversation with the patient's son. He stated "she is not depressed and she is not taking an antidepressant". "She is dramatic not depressed". At this point, the patient reports that she is doing very well emotionally, physical exam is confirmatory, she disagrees with some of the statements made by her son.  No need for medication at this point. PDMP okay, Ambien refilled. MCI: Saw neurology, 01/17/2022, Dx MCI, based on symptoms such as forgetfulness, repeating herself, asking the same questions. Rx Aricept.  Subsequently, had a reaction to it (diarrhea, nausea, vomiting), neurology recommended a formal neuropsych evaluation before trying something like Namenda. Patient reports that she like anybody her age is forgetful at times, she does not think she has a problem, physical exam is confirmatory, I do not believe there is need for medication at this point. Systolic murmur?  First time I feel possibly a murmur, reassess on RTC. Social: Lives independent facility, drives and take care for her first.  Her son is still the healthcare power of attorney Preventive care: Tdap recommended, flu shot this fall recommended RTC 4 months. 4-3 HTN:On Zestoretic, seems well controlled, BMP after surgery satisfactory. Hyperlipidemia, last LDL 108, on Pravachol 40 mg, Zetia was added, check labs. Anxiety, insomnia: Now also with depression triggered by chronic pain. She had a knee replacement and is not bouncing back as quickly as she would like to.  Listening therapy provided, options include counseling and  medication.  Would like to try medicine.  I elected Cymbalta 30 mg daily, no interactions and hopefully could help her with her chronic pain (she is also on gabapentin).  Reassess in 3 months, we could increase the dose before next appointment.  See AVS  TSH: was low at some point, re check DJD: Status post knee replacement few weeks ago. Preventive care reviewed RTC 3 months

## 2022-03-20 NOTE — Telephone Encounter (Signed)
Can you resend Rx for Ambien again? Transmission to the pharmacy failed. Thank you.

## 2022-03-21 MED ORDER — ZOLPIDEM TARTRATE 5 MG PO TABS: 5.0000 mg | ORAL_TABLET | Freq: Every evening | ORAL | 5 refills | Status: DC | PRN

## 2022-03-21 NOTE — Telephone Encounter (Signed)
Prescription resent

## 2022-03-21 NOTE — Assessment & Plan Note (Signed)
HTN: BP is very good, no ambulatory BPs, continue Zestoretic, last BMP okay.  RF meds. Hyperlipidemia: On pravastatin and Zetia.  Last LDL satisfactory.  RF Zetia Anxiety insomnia depression: Since the last office visit, I had a conversation with the patient's son. He stated "she is not depressed and she is not taking an antidepressant". "She is dramatic not depressed". At this point, the patient reports that she is doing very well emotionally, physical exam is confirmatory, she disagrees with some of the statements made by her son.  No need for medication at this point. PDMP okay, Ambien refilled. MCI: Saw neurology, 01/17/2022, Dx MCI, based on symptoms such as forgetfulness, repeating herself, asking the same questions. Rx Aricept.  Subsequently, had a reaction to it (diarrhea, nausea, vomiting), neurology recommended a formal neuropsych evaluation before trying something like Namenda. Patient reports that she "like anybody her age"  is forgetful at times, she does not think she has a problem, physical exam is confirmatory, I do not believe there is need for medication at this point. Systolic murmur?  First time I feel possibly a murmur, reassess on RTC. Social: lives at a independent living facility, drives and take care for her ADLs.  Her son is still the healthcare power of attorney Preventive care: Tdap recommended, flu shot this fall recommended RTC 4 months.

## 2022-03-27 DIAGNOSIS — G894 Chronic pain syndrome: Secondary | ICD-10-CM | POA: Diagnosis not present

## 2022-03-30 DIAGNOSIS — M5416 Radiculopathy, lumbar region: Secondary | ICD-10-CM | POA: Diagnosis not present

## 2022-04-19 DIAGNOSIS — Z471 Aftercare following joint replacement surgery: Secondary | ICD-10-CM | POA: Diagnosis not present

## 2022-05-05 DIAGNOSIS — M6281 Muscle weakness (generalized): Secondary | ICD-10-CM | POA: Diagnosis not present

## 2022-05-08 DIAGNOSIS — M6281 Muscle weakness (generalized): Secondary | ICD-10-CM | POA: Diagnosis not present

## 2022-05-09 DIAGNOSIS — M6281 Muscle weakness (generalized): Secondary | ICD-10-CM | POA: Diagnosis not present

## 2022-05-25 DIAGNOSIS — M6281 Muscle weakness (generalized): Secondary | ICD-10-CM | POA: Diagnosis not present

## 2022-05-29 DIAGNOSIS — M6281 Muscle weakness (generalized): Secondary | ICD-10-CM | POA: Diagnosis not present

## 2022-06-01 DIAGNOSIS — M6281 Muscle weakness (generalized): Secondary | ICD-10-CM | POA: Diagnosis not present

## 2022-06-05 DIAGNOSIS — M6281 Muscle weakness (generalized): Secondary | ICD-10-CM | POA: Diagnosis not present

## 2022-06-12 DIAGNOSIS — M6281 Muscle weakness (generalized): Secondary | ICD-10-CM | POA: Diagnosis not present

## 2022-06-14 ENCOUNTER — Telehealth: Payer: Self-pay | Admitting: Internal Medicine

## 2022-06-14 NOTE — Telephone Encounter (Signed)
Pt called requesting a copy of her med list be sent to her email. Pt stated she is unable to view the list via MyChart.

## 2022-06-14 NOTE — Telephone Encounter (Signed)
Med list emailed to Pt at [email or web address]

## 2022-06-26 DIAGNOSIS — M6281 Muscle weakness (generalized): Secondary | ICD-10-CM | POA: Diagnosis not present

## 2022-07-11 ENCOUNTER — Telehealth: Payer: Self-pay | Admitting: Internal Medicine

## 2022-07-11 NOTE — Telephone Encounter (Signed)
Please advise 

## 2022-07-11 NOTE — Telephone Encounter (Signed)
Patient called to say that she has a head cold that has gone into her chest. Patient unable to come in for a visit so she was offered a virtual visit, but patient said she does not text or do anything like that so would not know how to do a visit virtually. She wants to know if she can get an antibiotic so that it does not get any worse. [id number] [place] [place] [address] [place]Phone: [phone] Fax: [phone]

## 2022-07-11 NOTE — Telephone Encounter (Signed)
Arrange a phone visit

## 2022-07-12 ENCOUNTER — Telehealth (INDEPENDENT_AMBULATORY_CARE_PROVIDER_SITE_OTHER): Payer: Medicare Other | Admitting: Internal Medicine

## 2022-07-12 ENCOUNTER — Encounter: Payer: Self-pay | Admitting: Internal Medicine

## 2022-07-12 VITALS — Ht 65.0 in

## 2022-07-12 DIAGNOSIS — J069 Acute upper respiratory infection, unspecified: Secondary | ICD-10-CM

## 2022-07-12 NOTE — Progress Notes (Signed)
Called several times by my nurse by me: No answer

## 2022-07-12 NOTE — Progress Notes (Deleted)
   Subjective:    Patient ID: [name], female    DOB: 05/10/1941, 81 y.o.   MRN: [id number]  DOS:  07/12/2022 Type of visit - description:     Review of Systems See above   Past Medical History:  Diagnosis Date   Adenomatous colon polyp 03/2003   Anxiety    ativan for sleep   Baker's cyst    Degenerative lumbar disc    Dr. Nelva Bush, epidural injections   Goiter    History of elevated homocysteine    Hypertension 09/11/2005   Lumbar radiculopathy    Dr. Nelva Bush, epidureal injections   Lumbosacral radiculopathy    seeing Pain Management   OA (osteoarthritis)     knee pain after TKR left , back pain- last steroid injection back 2 months ago Dr Nelva Bush   PONV (postoperative nausea and vomiting)    Postlaminectomy syndrome, lumbar region    Dr. Nelva Bush, recommended doing bilateral S1 transforaminal epidural with D5W   Thyroid nodule    rt benign colloid nodule--left hyperplastic nodule- Thyroid bx 2006: neg    Past Surgical History:  Procedure Laterality Date   ABDOMINAL HYSTERECTOMY     BACK SURGERY  11-09-2011   at [facility], lumbar   bakers cystectomy     right knee   FACELIFT     INCISIONAL HERNIA REPAIR     KNEE ARTHROSCOPY     L   replacement 2007, Dr Corinne Ports, then a manipulation, then redo TKR 11/09 w/ Dr.Alusio. Parcial replacement again 09-2010   KNEE SURGERY     x 5-  left   OOPHORECTOMY     SHOULDER SURGERY     replacements: L 2010, R 2012   TOTAL HIP ARTHROPLASTY  3/11   Right    TOTAL KNEE ARTHROPLASTY  05/20/2012   Procedure: TOTAL KNEE ARTHROPLASTY;  Surgeon: Gearlean Alf, MD;  Location: [facility];  Service: Orthopedics;  Laterality: Right;   TOTAL KNEE REVISION Left 10/26/2021   Procedure: Left knee femoral vs total knee arthroplasty revision;  Surgeon: Gaynelle Arabian, MD;  Location: [facility];  Service: Orthopedics;  Laterality: Left;    Current Outpatient Medications  Medication Instructions   azelastine (ASTELIN) 0.1 % nasal spray 2 sprays, Each Nare, At bedtime  PRN   Calcium Carbonate-Vitamin D (CALCIUM + D PO) 1 tablet, Oral, Daily   ezetimibe (ZETIA) 10 mg, Oral, Daily   gabapentin (NEURONTIN) 800 MG tablet TAKE 1 TABLET(800 MG) BY MOUTH FOUR TIMES DAILY   HYDROcodone-acetaminophen (NORCO) 10-325 MG tablet 1 tablet, Oral, 3 times daily PRN   Lidocaine 4 % PTCH 1 patch, Apply externally, Daily PRN   lisinopril-hydrochlorothiazide (ZESTORETIC) 20-12.5 MG tablet 1 tablet, Oral, Daily   MELATONIN PO 1 capsule, Oral, At bedtime PRN   pravastatin (PRAVACHOL) 40 mg, Oral, Daily at bedtime, TAKE 1 TABLET BY MOUTH EVERYDAY AT BEDTIME   zolpidem (AMBIEN) 5 mg, Oral, At bedtime PRN, for sleep       Objective:   Physical Exam Ht '5\' 5"'$  (1.651 m)   BMI 27.33 kg/m      Assessment

## 2022-07-18 ENCOUNTER — Other Ambulatory Visit: Payer: Self-pay

## 2022-07-18 DIAGNOSIS — M6281 Muscle weakness (generalized): Secondary | ICD-10-CM | POA: Diagnosis not present

## 2022-07-18 MED ORDER — AZELASTINE HCL 0.1 % NA SOLN
2.0000 | Freq: Every evening | NASAL | 12 refills | Status: DC | PRN
Start: 2022-07-18 — End: 2023-01-15

## 2022-07-21 ENCOUNTER — Ambulatory Visit: Payer: Medicare Other | Admitting: Internal Medicine

## 2022-07-25 DIAGNOSIS — Z471 Aftercare following joint replacement surgery: Secondary | ICD-10-CM | POA: Diagnosis not present

## 2022-07-25 DIAGNOSIS — Z96652 Presence of left artificial knee joint: Secondary | ICD-10-CM | POA: Diagnosis not present

## 2022-07-28 ENCOUNTER — Ambulatory Visit (INDEPENDENT_AMBULATORY_CARE_PROVIDER_SITE_OTHER): Payer: Medicare Other | Admitting: Internal Medicine

## 2022-07-28 ENCOUNTER — Encounter: Payer: Self-pay | Admitting: Internal Medicine

## 2022-07-28 VITALS — BP 126/76 | HR 62 | Temp 98.3°F | Resp 18 | Ht 65.0 in | Wt 165.1 lb

## 2022-07-28 DIAGNOSIS — G3184 Mild cognitive impairment, so stated: Secondary | ICD-10-CM | POA: Diagnosis not present

## 2022-07-28 DIAGNOSIS — E7800 Pure hypercholesterolemia, unspecified: Secondary | ICD-10-CM

## 2022-07-28 DIAGNOSIS — R739 Hyperglycemia, unspecified: Secondary | ICD-10-CM

## 2022-07-28 DIAGNOSIS — G4700 Insomnia, unspecified: Secondary | ICD-10-CM | POA: Diagnosis not present

## 2022-07-28 DIAGNOSIS — I10 Essential (primary) hypertension: Secondary | ICD-10-CM | POA: Diagnosis not present

## 2022-07-28 NOTE — Patient Instructions (Addendum)
Vaccines I recommend:  Covid booster RSV vaccine Tdap (tetanus)  Continue zolpidem 5 mg every night Restart melatonin over-the-counter 1 or 2 tablets a day   HEALTHY SLEEP Sleep hygiene: Basic rules for a good night's sleep  Sleep only as much as you need to feel rested and then get out of bed  Keep a regular sleep schedule  Avoid forcing sleep  Exercise regularly for at least 20 minutes, preferably 4 to 5 hours before bedtime  Avoid caffeinated beverages after lunch  Avoid alcohol near bedtime: no "night cap"  Avoid smoking, especially in the evening  Do not go to bed hungry  Adjust bedroom environment  Avoid prolonged use of light-emitting screens before bedtime   Deal with your worries before bedtime     GO TO THE LAB : Get the blood work     GO TO THE FRONT DESK, [place] back for   a checkup in 4 months

## 2022-07-28 NOTE — Progress Notes (Unsigned)
Subjective:    Patient ID: [name], female    DOB: 06/30/1941, 81 y.o.   MRN: [id number]  DOS:  07/28/2022 Type of visit - description: Follow-up  Reports that since the last office visit  is doing well Still has occasional difficulty sleeping.  Change Ambien?Marland Kitchen MCI: Reports no issues.  Review of Systems See above   Past Medical History:  Diagnosis Date   Adenomatous colon polyp 03/2003   Anxiety    ativan for sleep   Baker's cyst    Degenerative lumbar disc    Dr. Nelva Bush, epidural injections   Goiter    History of elevated homocysteine    Hypertension 09/11/2005   Lumbar radiculopathy    Dr. Nelva Bush, epidureal injections   Lumbosacral radiculopathy    seeing Pain Management   OA (osteoarthritis)     knee pain after TKR left , back pain- last steroid injection back 2 months ago Dr Nelva Bush   PONV (postoperative nausea and vomiting)    Postlaminectomy syndrome, lumbar region    Dr. Nelva Bush, recommended doing bilateral S1 transforaminal epidural with D5W   Thyroid nodule    rt benign colloid nodule--left hyperplastic nodule- Thyroid bx 2006: neg    Past Surgical History:  Procedure Laterality Date   ABDOMINAL HYSTERECTOMY     BACK SURGERY  11-09-2011   at [facility], lumbar   bakers cystectomy     right knee   FACELIFT     INCISIONAL HERNIA REPAIR     KNEE ARTHROSCOPY     L   replacement 2007, Dr Corinne Ports, then a manipulation, then redo TKR 11/09 w/ Dr.Alusio. Parcial replacement again 09-2010   KNEE SURGERY     x 5-  left   OOPHORECTOMY     SHOULDER SURGERY     replacements: L 2010, R 2012   TOTAL HIP ARTHROPLASTY  3/11   Right    TOTAL KNEE ARTHROPLASTY  05/20/2012   Procedure: TOTAL KNEE ARTHROPLASTY;  Surgeon: Gearlean Alf, MD;  Location: [facility];  Service: Orthopedics;  Laterality: Right;   TOTAL KNEE REVISION Left 10/26/2021   Procedure: Left knee femoral vs total knee arthroplasty revision;  Surgeon: Gaynelle Arabian, MD;  Location: [facility];  Service: Orthopedics;   Laterality: Left;    Current Outpatient Medications  Medication Instructions   azelastine (ASTELIN) 0.1 % nasal spray 2 sprays, Each Nare, At bedtime PRN   Calcium Carbonate-Vitamin D (CALCIUM + D PO) 1 tablet, Oral, Daily   ezetimibe (ZETIA) 10 mg, Oral, Daily   gabapentin (NEURONTIN) 800 MG tablet TAKE 1 TABLET(800 MG) BY MOUTH FOUR TIMES DAILY   HYDROcodone-acetaminophen (NORCO) 10-325 MG tablet 1 tablet, Oral, 3 times daily PRN   Lidocaine 4 % PTCH 1 patch, Apply externally, Daily PRN   lisinopril-hydrochlorothiazide (ZESTORETIC) 20-12.5 MG tablet 1 tablet, Oral, Daily   MELATONIN PO 1 capsule, Oral, At bedtime PRN   pravastatin (PRAVACHOL) 40 mg, Oral, Daily at bedtime, TAKE 1 TABLET BY MOUTH EVERYDAY AT BEDTIME   zolpidem (AMBIEN) 5 mg, Oral, At bedtime PRN, for sleep       Objective:   Physical Exam BP 126/76   Pulse 62   Temp 98.3 F (36.8 C) (Oral)   Resp 18   Ht '5\' 5"'$  (1.651 m)   Wt 165 lb 2 oz (74.9 kg)   SpO2 92%   BMI 27.48 kg/m  General:   Well developed, NAD, BMI noted. HEENT:  Normocephalic . Face symmetric, atraumatic Lungs:  CTA B  Normal respiratory effort, no intercostal retractions, no accessory muscle use. Heart: RRR,  no murmur.  Lower extremities: no pretibial edema bilaterally  Skin: Not pale. Not jaundice Neurologic:  alert & oriented X3.  Speech normal, gait appropriate for age and unassisted Psych--  Cognition and judgment appear intact.  Cooperative with normal attention span and concentration.  Behavior appropriate. No anxious or depressed appearing.      Assessment     ASSESSMENT HTN Hyperlipidemia: Atorvastatin >> diarrhea, Rx pravastatin Anxiety, insomnia, ativan rx per pcp Elevated homocysteine Goiter  -Thyroid nodule BX 2006 neg, [place] 2013 stable,US 02-2016 -- bx 03-2016 benign -DX Graves' disease 2021 [facility]: sees pain mgmt ---DJD . ---Postlaminectomy syndrome, back surgery 2013  ---Intolerant to Lyrica, gabapentin  helps --hydrocodone rx elsewhere -- Neuropathy: as off 11-2017 has seen 4 different specialists --chronic pain syndrome MCI: Per neuro 01/17/2022.  DC Aricept d/t diarrhea  PLAN BMP A1c  Routine follow-up, here by herself. HTN: BP today is very good, continue Zestoretic.  Check a BMP. Hyperglycemia: Check A1c High cholesterol: Well-controlled on Pravachol and Zetia. Insomnia: Still has occasional difficulty sleeping, I declined to increase Ambien 5 mg, I encouraged her good habits (see AVS) and restart melatonin. MCI: Patient is by herself today, she reports that she is doing okay.  Today he is alert oriented x3. Had a flu shot. Discussed benefits of COVID and RSV Systolic murmur?  No murmur on today's exam. Social: Lives near our [place] office, encouraged her to request a transfer. RTC 4 months  11---- HTN: BP is very good, no ambulatory BPs, continue Zestoretic, last BMP okay.  RF meds. Hyperlipidemia: On pravastatin and Zetia.  Last LDL satisfactory.  RF Zetia Anxiety insomnia depression: Since the last office visit, I had a conversation with the patient's son. He stated "she is not depressed and she is not taking an antidepressant". "She is dramatic not depressed". At this point, the patient reports that she is doing very well emotionally, physical exam is confirmatory, she disagrees with some of the statements made by her son.  No need for medication at this point. PDMP okay, Ambien refilled. MCI: Saw neurology, 01/17/2022, Dx MCI, based on symptoms such as forgetfulness, repeating herself, asking the same questions. Rx Aricept.  Subsequently, had a reaction to it (diarrhea, nausea, vomiting), neurology recommended a formal neuropsych evaluation before trying something like Namenda. Patient reports that she "like anybody her age"  is forgetful at times, she does not think she has a problem, physical exam is confirmatory, I do not believe there is need for medication at this  point. Systolic murmur?  First time I feel possibly a murmur, reassess on RTC. Social: lives at a independent living facility, drives and take care for her ADLs.  Her son is still the healthcare power of attorney Preventive care: Tdap recommended, flu shot this fall recommended RTC 4 months.

## 2022-07-29 DIAGNOSIS — G3184 Mild cognitive impairment, so stated: Secondary | ICD-10-CM | POA: Insufficient documentation

## 2022-07-29 NOTE — Assessment & Plan Note (Signed)
Routine follow-up, here by herself. HTN: BP today is very good, continue Zestoretic.  Check a BMP. Hyperglycemia: Check A1c High cholesterol: Well-controlled on Pravachol and Zetia. Insomnia: Still has occasional difficulty sleeping, I declined to increase Ambien 5 mg, I encouraged her good sleep habits (see AVS) and restart melatonin. MCI: Patient is by herself today, she reports that she is doing okay.  Today he is alert oriented x3. Systolic murmur?  See LOV. No murmur on today's exam Preventive care: Had a flu shot. Discussed benefits of COVID and RSV Social: Lives near our [place] office, would like to transfer there, encouraged her to do so RTC 4 months

## 2022-08-07 ENCOUNTER — Telehealth: Payer: Self-pay | Admitting: Internal Medicine

## 2022-08-07 MED ORDER — PRAVASTATIN SODIUM 40 MG PO TABS: 40.0000 mg | ORAL_TABLET | Freq: Every day | ORAL | 1 refills | Status: DC

## 2022-08-07 NOTE — Telephone Encounter (Signed)
Patient states she needs a refill on her atorvastatin. Medication was discontinued on her med list, please advise.

## 2022-08-07 NOTE — Telephone Encounter (Signed)
Pt should be on pravastatin. Rx sent.

## 2022-08-19 DIAGNOSIS — G894 Chronic pain syndrome: Secondary | ICD-10-CM | POA: Diagnosis not present

## 2022-08-19 DIAGNOSIS — M5416 Radiculopathy, lumbar region: Secondary | ICD-10-CM | POA: Diagnosis not present

## 2022-08-29 DIAGNOSIS — M5416 Radiculopathy, lumbar region: Secondary | ICD-10-CM | POA: Diagnosis not present

## 2022-09-09 ENCOUNTER — Telehealth: Payer: Self-pay | Admitting: Internal Medicine

## 2022-09-12 NOTE — Telephone Encounter (Signed)
PDMP okay, Rx sent 

## 2022-09-12 NOTE — Telephone Encounter (Signed)
Requesting: Ambien '5mg'$   Contract: 07/11/21 UDS: None Last Visit: 07/28/22 Next Visit: None Last Refill: 03/21/22 #30 and 5RF   Please Advise

## 2022-10-19 NOTE — Telephone Encounter (Signed)
Opened in error

## 2022-10-31 DIAGNOSIS — Z961 Presence of intraocular lens: Secondary | ICD-10-CM | POA: Diagnosis not present

## 2022-11-08 ENCOUNTER — Other Ambulatory Visit: Payer: Self-pay | Admitting: Internal Medicine

## 2022-11-08 NOTE — Telephone Encounter (Signed)
Rx denied. Pt did not have labs completed at her 07/2022 visit. Needs to complete labs and schedule 4 month f/u (due March 2024).

## 2022-11-15 DIAGNOSIS — M65332 Trigger finger, left middle finger: Secondary | ICD-10-CM | POA: Diagnosis not present

## 2022-11-16 ENCOUNTER — Other Ambulatory Visit: Payer: Self-pay | Admitting: Internal Medicine

## 2022-11-16 NOTE — Telephone Encounter (Signed)
Rx denied. Pt did not have labs completed at 11/23 visit. I have asked pharmacy to have her call us to schedule lab appt.

## 2022-11-20 ENCOUNTER — Other Ambulatory Visit: Payer: Self-pay | Admitting: Internal Medicine

## 2022-11-20 NOTE — Telephone Encounter (Signed)
Rx denied. Pt did not have labs completed at 11/23 visit. I have asked pharmacy to have her call us to schedule lab appt.  

## 2022-11-28 DIAGNOSIS — L650 Telogen effluvium: Secondary | ICD-10-CM | POA: Diagnosis not present

## 2022-12-04 ENCOUNTER — Other Ambulatory Visit: Payer: Self-pay | Admitting: Internal Medicine

## 2022-12-04 NOTE — Telephone Encounter (Signed)
Rx denied. Pt did not have labs completed at 11/23 visit. I have asked pharmacy to have her call us to schedule lab appt.  

## 2022-12-05 ENCOUNTER — Other Ambulatory Visit: Payer: Self-pay | Admitting: Internal Medicine

## 2022-12-05 NOTE — Telephone Encounter (Signed)
Rx denied. Pt did not have labs completed at 11/23 visit. I have asked pharmacy to have her call us to schedule lab appt.  

## 2022-12-06 ENCOUNTER — Telehealth: Payer: Self-pay | Admitting: Internal Medicine

## 2022-12-06 ENCOUNTER — Other Ambulatory Visit: Payer: Self-pay | Admitting: Internal Medicine

## 2022-12-06 ENCOUNTER — Other Ambulatory Visit (INDEPENDENT_AMBULATORY_CARE_PROVIDER_SITE_OTHER): Payer: Medicare Other

## 2022-12-06 DIAGNOSIS — R739 Hyperglycemia, unspecified: Secondary | ICD-10-CM | POA: Diagnosis not present

## 2022-12-06 DIAGNOSIS — I10 Essential (primary) hypertension: Secondary | ICD-10-CM

## 2022-12-06 NOTE — Telephone Encounter (Signed)
Advised patient, They have 6 practitioners, each one of them is very good.  Recommend to call them and ask for a transfer.

## 2022-12-06 NOTE — Telephone Encounter (Signed)
Patient came in for labs today & stated that now since she is living at [facility] in [place], She is wanting to find another PCP closer to her  It looks like she is closer to [place]    Patient asked to send message back and see if Larose Kells recommended anyone   Please advise

## 2022-12-06 NOTE — Telephone Encounter (Signed)
Can you help get Pt set up w/ a new provider at your office? She has moved closer to [place]. Thank you.

## 2022-12-07 ENCOUNTER — Other Ambulatory Visit: Payer: Medicare Other

## 2022-12-07 LAB — BASIC METABOLIC PANEL
BUN: 31 mg/dL — ABNORMAL HIGH (ref 6–23)
CO2: 30 mEq/L (ref 19–32)
Calcium: 9.9 mg/dL (ref 8.4–10.5)
Chloride: 98 mEq/L (ref 96–112)
Creatinine, Ser: 1.34 mg/dL — ABNORMAL HIGH (ref 0.40–1.20)
GFR: 37.06 mL/min — ABNORMAL LOW (ref >60.00)
Glucose, Bld: 144 mg/dL — ABNORMAL HIGH (ref 70–99)
Potassium: 4.3 mEq/L (ref 3.5–5.1)
Sodium: 137 mEq/L (ref 135–145)

## 2022-12-07 LAB — HEMOGLOBIN A1C: Hgb A1c MFr Bld: 5.9 % (ref 4.6–6.5)

## 2022-12-13 ENCOUNTER — Other Ambulatory Visit: Payer: Self-pay | Admitting: Internal Medicine

## 2022-12-25 ENCOUNTER — Encounter: Payer: Self-pay | Admitting: Internal Medicine

## 2022-12-27 ENCOUNTER — Telehealth: Payer: Self-pay | Admitting: Internal Medicine

## 2022-12-27 NOTE — Telephone Encounter (Signed)
Patient wanted to get some recommendations for a provider in [place], [place] since she will be moving there soon. Please advice.

## 2022-12-28 NOTE — Telephone Encounter (Signed)
LMOM informing Pt that unfortunately we don't know of anyone in [place], [place] to recommend.

## 2023-01-03 DIAGNOSIS — M65332 Trigger finger, left middle finger: Secondary | ICD-10-CM | POA: Diagnosis not present

## 2023-01-13 ENCOUNTER — Other Ambulatory Visit: Payer: Self-pay | Admitting: Internal Medicine

## 2023-01-15 ENCOUNTER — Encounter: Payer: Self-pay | Admitting: Internal Medicine

## 2023-01-15 ENCOUNTER — Ambulatory Visit (INDEPENDENT_AMBULATORY_CARE_PROVIDER_SITE_OTHER): Payer: Medicare Other | Admitting: Internal Medicine

## 2023-01-15 VITALS — BP 126/68 | HR 51 | Temp 98.1°F | Resp 18 | Ht 65.0 in | Wt 167.5 lb

## 2023-01-15 DIAGNOSIS — E7800 Pure hypercholesterolemia, unspecified: Secondary | ICD-10-CM

## 2023-01-15 DIAGNOSIS — I10 Essential (primary) hypertension: Secondary | ICD-10-CM

## 2023-01-15 DIAGNOSIS — G4700 Insomnia, unspecified: Secondary | ICD-10-CM | POA: Diagnosis not present

## 2023-01-15 LAB — ALT: ALT: 14 U/L (ref 0–35)

## 2023-01-15 LAB — LIPID PANEL
Cholesterol: 219 mg/dL — ABNORMAL HIGH (ref 0–200)
HDL: 121.4 mg/dL (ref >39.00)
LDL Cholesterol: 73 mg/dL (ref 0–99)
NonHDL: 98.07
Total CHOL/HDL Ratio: 2
Triglycerides: 124 mg/dL (ref 0.0–149.0)
VLDL: 24.8 mg/dL (ref 0.0–40.0)

## 2023-01-15 LAB — CBC WITH DIFFERENTIAL/PLATELET
Basophils Absolute: 0.1 10*3/uL (ref 0.0–0.1)
Basophils Relative: 0.6 % (ref 0.0–3.0)
Eosinophils Absolute: 0.2 10*3/uL (ref 0.0–0.7)
Eosinophils Relative: 2.7 % (ref 0.0–5.0)
HCT: 41.3 % (ref 36.0–46.0)
Hemoglobin: 13.7 g/dL (ref 12.0–15.0)
Lymphocytes Relative: 23.3 % (ref 12.0–46.0)
Lymphs Abs: 1.9 10*3/uL (ref 0.7–4.0)
MCHC: 33.1 g/dL (ref 30.0–36.0)
MCV: 95.1 fl (ref 78.0–100.0)
Monocytes Absolute: 0.7 10*3/uL (ref 0.1–1.0)
Monocytes Relative: 8.3 % (ref 3.0–12.0)
Neutro Abs: 5.2 10*3/uL (ref 1.4–7.7)
Neutrophils Relative %: 65.1 % (ref 43.0–77.0)
Platelets: 272 10*3/uL (ref 150.0–400.0)
RBC: 4.34 Mil/uL (ref 3.87–5.11)
RDW: 13.5 % (ref 11.5–15.5)
WBC: 8 10*3/uL (ref 4.0–10.5)

## 2023-01-15 LAB — BASIC METABOLIC PANEL
BUN: 31 mg/dL — ABNORMAL HIGH (ref 6–23)
CO2: 29 mEq/L (ref 19–32)
Calcium: 9.9 mg/dL (ref 8.4–10.5)
Chloride: 98 mEq/L (ref 96–112)
Creatinine, Ser: 1.3 mg/dL — ABNORMAL HIGH (ref 0.40–1.20)
GFR: 38.41 mL/min — ABNORMAL LOW (ref >60.00)
Glucose, Bld: 92 mg/dL (ref 70–99)
Potassium: 4.6 mEq/L (ref 3.5–5.1)
Sodium: 139 mEq/L (ref 135–145)

## 2023-01-15 LAB — AST: AST: 31 U/L (ref 0–37)

## 2023-01-15 MED ORDER — LISINOPRIL-HYDROCHLOROTHIAZIDE 20-12.5 MG PO TABS: 1.0000 | ORAL_TABLET | Freq: Every day | ORAL | 1 refills | Status: AC

## 2023-01-15 MED ORDER — EZETIMIBE 10 MG PO TABS: 10.0000 mg | ORAL_TABLET | Freq: Every day | ORAL | 1 refills | Status: AC

## 2023-01-15 MED ORDER — PRAVASTATIN SODIUM 40 MG PO TABS: 40.0000 mg | ORAL_TABLET | Freq: Every day | ORAL | 1 refills | Status: AC

## 2023-01-15 MED ORDER — AZELASTINE HCL 0.1 % NA SOLN: 2.0000 | Freq: Every evening | NASAL | 12 refills | Status: AC | PRN

## 2023-01-15 MED ORDER — ZOLPIDEM TARTRATE 5 MG PO TABS: 5.0000 mg | ORAL_TABLET | Freq: Every evening | ORAL | 3 refills | Status: DC | PRN

## 2023-01-15 NOTE — Telephone Encounter (Signed)
Appt later today.

## 2023-01-15 NOTE — Assessment & Plan Note (Signed)
HTN: Checking labs, refill Zestoretic.  No recent ambulatory BPs but BP today is very good.  No change. Hyperlipidemia: On Zetia Pravachol, refill sent.  Check FLP AST ALT Anxiety, insomnia: Takes Ambien most nights, sometimes wake up in the middle of the night.  Recommend to take over-the-counter melatonin before bedtime, take Ambien only if she wakes up in the middle of the night.  Tips of all healthy sleep habits provided. Allergies: They were severe this spring, allergy season is ending.  Nevertheless we agreed to try Allegra 60 mg twice daily if needed, refill for Astelin sent. Social: Moving to [place], has family there.  She probably won't come back here if she finds a PCP there. RTC 4 to 5 months if needed

## 2023-01-15 NOTE — Progress Notes (Signed)
Subjective:    Patient ID: [name], female    DOB: 04/25/1941, 82 y.o.   MRN: [id number]  DOS:  01/15/2023 Type of visit - description: Routine follow up  In general feels well. Allergies has been severe this spring, c/o sinus congestion, nasal discharge.  Denies fever or chills.  No cough, no chest congestion. Still has some difficulty with sleep, wakes up in the middle of the night.  Review of Systems See above   Past Medical History:  Diagnosis Date   Adenomatous colon polyp 03/2003   Anxiety    ativan for sleep   Baker's cyst    Degenerative lumbar disc    Dr. Ethelene Hal, epidural injections   Goiter    History of elevated homocysteine    Hypertension 09/11/2005   Lumbar radiculopathy    Dr. Ethelene Hal, epidureal injections   Lumbosacral radiculopathy    seeing Pain Management   OA (osteoarthritis)     knee pain after TKR left , back pain- last steroid injection back 2 months ago Dr Ethelene Hal   PONV (postoperative nausea and vomiting)    Postlaminectomy syndrome, lumbar region    Dr. Ethelene Hal, recommended doing bilateral S1 transforaminal epidural with D5W   Thyroid nodule    rt benign colloid nodule--left hyperplastic nodule- Thyroid bx 2006: neg    Past Surgical History:  Procedure Laterality Date   ABDOMINAL HYSTERECTOMY     BACK SURGERY  11-09-2011   at [facility], lumbar   bakers cystectomy     right knee   FACELIFT     INCISIONAL HERNIA REPAIR     KNEE ARTHROSCOPY     L   replacement 2007, Dr Earlie Lou, then a manipulation, then redo TKR 11/09 w/ Dr.Alusio. Parcial replacement again 09-2010   KNEE SURGERY     x 5-  left   OOPHORECTOMY     SHOULDER SURGERY     replacements: L 2010, R 2012   TOTAL HIP ARTHROPLASTY  3/11   Right    TOTAL KNEE ARTHROPLASTY  05/20/2012   Procedure: TOTAL KNEE ARTHROPLASTY;  Surgeon: Loanne Drilling, MD;  Location: [facility];  Service: Orthopedics;  Laterality: Right;   TOTAL KNEE REVISION Left 10/26/2021   Procedure: Left knee femoral vs total knee  arthroplasty revision;  Surgeon: Ollen Gross, MD;  Location: [facility];  Service: Orthopedics;  Laterality: Left;    Current Outpatient Medications  Medication Instructions   azelastine (ASTELIN) 0.1 % nasal spray 2 sprays, Each Nare, At bedtime PRN   Calcium Carbonate-Vitamin D (CALCIUM + D PO) 1 tablet, Oral, Daily   ezetimibe (ZETIA) 10 mg, Oral, Daily   gabapentin (NEURONTIN) 800 MG tablet TAKE 1 TABLET(800 MG) BY MOUTH FOUR TIMES DAILY   HYDROcodone-acetaminophen (NORCO) 10-325 MG tablet 1 tablet, Oral, 3 times daily PRN   Lidocaine 4 % PTCH 1 patch, Apply externally, Daily PRN   lisinopril-hydrochlorothiazide (ZESTORETIC) 20-12.5 MG tablet 1 tablet, Oral, Daily   MELATONIN PO 1 capsule, Oral, At bedtime PRN   pravastatin (PRAVACHOL) 40 mg, Oral, Daily at bedtime   zolpidem (AMBIEN) 5 MG tablet TAKE 1 TABLET (5 MG TOTAL) BY MOUTH EVERY DAY AT BEDTIME AS NEEDED FOR SLEEP       Objective:   Physical Exam BP 126/68   Pulse (!) 51   Temp 98.1 F (36.7 C) (Oral)   Resp 18   Ht 5\' 5"  (1.651 m)   Wt 167 lb 8 oz (76 kg)   SpO2 97%  BMI 27.87 kg/m  General:   Well developed, NAD, BMI noted. HEENT:  Normocephalic . Face symmetric, atraumatic.  Nose congested. Lungs:  CTA B Normal respiratory effort, no intercostal retractions, no accessory muscle use. Heart: RRR,  no murmur.  Lower extremities: no pretibial edema bilaterally  Skin: Not pale. Not jaundice Neurologic:  alert & oriented X3.  Speech normal, gait: Somewhat limited by DJD Psych--  Cognition and judgment appear intact.  Cooperative with normal attention span and concentration.  Behavior appropriate. No anxious or depressed appearing.      Assessment     ASSESSMENT HTN Hyperlipidemia: Atorvastatin >> diarrhea, Rx pravastatin Anxiety, insomnia, ativan rx per pcp Elevated homocysteine Goiter  -Thyroid nodule BX 2006 neg, [place] 2013 stable,US 02-2016 -- bx 03-2016 benign -DX Graves' disease 2021 [facility]: sees pain  mgmt ---DJD . ---Postlaminectomy syndrome, back surgery 2013  ---Intolerant to Lyrica, gabapentin helps --hydrocodone rx elsewhere -- Neuropathy: as off 11-2017 has seen 4 different specialists --chronic pain syndrome MCI: Per neuro 01/17/2022.  DC Aricept d/t diarrhea  PLAN HTN: Checking labs, refill Zestoretic.  No recent ambulatory BPs but BP today is very good.  No change. Hyperlipidemia: On Zetia Pravachol, refill sent.  Check FLP AST ALT Anxiety, insomnia: Takes Ambien most nights, sometimes wake up in the middle of the night.  Recommend to take over-the-counter melatonin before bedtime, take Ambien only if she wakes up in the middle of the night.  Tips of all healthy sleep habits provided. Allergies: They were severe this spring, allergy season is ending.  Nevertheless we agreed to try Allegra 60 mg twice daily if needed, refill for Astelin sent. Social: Moving to [place], has family there.  She probably won't come back here if she finds a PCP there. RTC 4 to 5 months if needed

## 2023-01-15 NOTE — Patient Instructions (Addendum)
For allergies: Continue Astelin nasal spray Try Allegra 60 mg over-the-counter: 1 tablet twice daily as needed  For difficulty sleeping:  Please read carefully the tips provided below Try melatonin 4 to 8 mg over-the-counter every night before going to bed If you wake up in the middle of the night okay to take Ambien 5 mg.  HEALTHY SLEEP Sleep hygiene: Basic rules for a good night's sleep  Sleep only as much as you need to feel rested and then get out of bed  Keep a regular sleep schedule  Avoid forcing sleep  Exercise regularly for at least 20 minutes, preferably 4 to 5 hours before bedtime  Avoid caffeinated beverages after lunch  Avoid alcohol near bedtime: no "night cap"  Avoid smoking, especially in the evening  Do not go to bed hungry  Adjust bedroom environment  Avoid prolonged use of light-emitting screens before bedtime   Deal with your worries before bedtime      GO TO THE LAB : Get the blood work     GO TO THE FRONT DESK, PLEASE SCHEDULE YOUR APPOINTMENTS Come back for checkup in 4 to 5 months   Vaccines I recommend: Covid booster Tdap (tetanus) RSV vaccine

## 2023-01-17 DIAGNOSIS — M65332 Trigger finger, left middle finger: Secondary | ICD-10-CM | POA: Diagnosis not present

## 2023-01-18 ENCOUNTER — Ambulatory Visit: Payer: Medicare Other | Admitting: Neurology

## 2023-02-12 ENCOUNTER — Other Ambulatory Visit: Payer: Self-pay | Admitting: Neurology

## 2023-02-15 DIAGNOSIS — M47896 Other spondylosis, lumbar region: Secondary | ICD-10-CM | POA: Diagnosis not present

## 2023-02-15 DIAGNOSIS — Z5181 Encounter for therapeutic drug level monitoring: Secondary | ICD-10-CM | POA: Diagnosis not present

## 2023-02-15 DIAGNOSIS — G894 Chronic pain syndrome: Secondary | ICD-10-CM | POA: Diagnosis not present

## 2023-02-15 DIAGNOSIS — R600 Localized edema: Secondary | ICD-10-CM | POA: Diagnosis not present

## 2023-02-15 DIAGNOSIS — Z79891 Long term (current) use of opiate analgesic: Secondary | ICD-10-CM | POA: Diagnosis not present

## 2023-02-15 DIAGNOSIS — M5136 Other intervertebral disc degeneration, lumbar region: Secondary | ICD-10-CM | POA: Diagnosis not present

## 2023-02-15 DIAGNOSIS — M5416 Radiculopathy, lumbar region: Secondary | ICD-10-CM | POA: Diagnosis not present

## 2023-02-15 DIAGNOSIS — I10 Essential (primary) hypertension: Secondary | ICD-10-CM | POA: Diagnosis not present

## 2023-02-15 DIAGNOSIS — Z79899 Other long term (current) drug therapy: Secondary | ICD-10-CM | POA: Diagnosis not present

## 2023-02-15 DIAGNOSIS — Z96652 Presence of left artificial knee joint: Secondary | ICD-10-CM | POA: Diagnosis not present

## 2023-02-15 DIAGNOSIS — M961 Postlaminectomy syndrome, not elsewhere classified: Secondary | ICD-10-CM | POA: Diagnosis not present

## 2023-02-27 DIAGNOSIS — M5416 Radiculopathy, lumbar region: Secondary | ICD-10-CM | POA: Diagnosis not present

## 2023-03-16 ENCOUNTER — Telehealth: Payer: Self-pay | Admitting: Internal Medicine

## 2023-03-16 MED ORDER — ZOLPIDEM TARTRATE 5 MG PO TABS: 5.0000 mg | ORAL_TABLET | Freq: Every evening | ORAL | 3 refills | Status: AC | PRN

## 2023-03-16 NOTE — Telephone Encounter (Signed)
Pt called stating she is moving and needs to have her default pharmacy changed to the following:  Prescription Request  03/16/2023  Is this a "Controlled Substance" medicine? Yes  LOV: 01/15/2023  What is the name of the medication or equipment?   zolpidem (AMBIEN) 5 MG tablet [[id number]]   Have you contacted your pharmacy to request a refill? No   Which pharmacy would you like this sent to?   [facility] [address], [place], [place] [address] P: [phone]  Patient notified that their request is being sent to the clinical staff for review and that they should receive a response within 2 business days.   Please advise at Mobile [phone] (mobile)

## 2023-03-16 NOTE — Telephone Encounter (Signed)
Pt is moving, needing Ambien sent to new pharmacy. [facility] in [place], [place]. I have pended the new pharmacy for you.

## 2023-03-16 NOTE — Addendum Note (Signed)
Addended byConrad [place] D on: 03/16/2023 08:56 AM   Modules accepted: Orders

## 2023-03-16 NOTE — Telephone Encounter (Signed)
  PDMP okay, rx sent  

## 2023-03-16 NOTE — Addendum Note (Signed)
Addended by: [name] E on: 03/16/2023 09:12 AM   Modules accepted: Orders

## 2023-05-10 DIAGNOSIS — E785 Hyperlipidemia, unspecified: Secondary | ICD-10-CM | POA: Diagnosis not present

## 2023-05-10 DIAGNOSIS — M25552 Pain in left hip: Secondary | ICD-10-CM | POA: Diagnosis not present

## 2023-05-10 DIAGNOSIS — G629 Polyneuropathy, unspecified: Secondary | ICD-10-CM | POA: Diagnosis not present

## 2023-05-10 DIAGNOSIS — I10 Essential (primary) hypertension: Secondary | ICD-10-CM | POA: Diagnosis not present

## 2023-05-10 DIAGNOSIS — G4700 Insomnia, unspecified: Secondary | ICD-10-CM | POA: Diagnosis not present

## 2023-05-24 DIAGNOSIS — M7918 Myalgia, other site: Secondary | ICD-10-CM | POA: Diagnosis not present

## 2023-05-24 DIAGNOSIS — M533 Sacrococcygeal disorders, not elsewhere classified: Secondary | ICD-10-CM | POA: Diagnosis not present

## 2023-05-24 DIAGNOSIS — M25552 Pain in left hip: Secondary | ICD-10-CM | POA: Diagnosis not present

## 2023-09-16 ENCOUNTER — Other Ambulatory Visit: Payer: Self-pay | Admitting: Internal Medicine
